# Patient Record
Sex: Female | Born: 1955 | Race: White | Hispanic: No | Marital: Married | Smoking: Former smoker
Health system: Southern US, Community
[De-identification: ages and names within clinical notes are randomized; demographics above are authoritative.]

## PROBLEM LIST (undated history)

## (undated) DIAGNOSIS — E079 Disorder of thyroid, unspecified: Secondary | ICD-10-CM

## (undated) DIAGNOSIS — I7300 Raynaud's syndrome without gangrene: Secondary | ICD-10-CM

## (undated) DIAGNOSIS — H269 Unspecified cataract: Secondary | ICD-10-CM

## (undated) DIAGNOSIS — C801 Malignant (primary) neoplasm, unspecified: Secondary | ICD-10-CM

## (undated) DIAGNOSIS — E785 Hyperlipidemia, unspecified: Secondary | ICD-10-CM

## (undated) DIAGNOSIS — A64 Unspecified sexually transmitted disease: Secondary | ICD-10-CM

## (undated) DIAGNOSIS — R1310 Dysphagia, unspecified: Secondary | ICD-10-CM

## (undated) DIAGNOSIS — C3491 Malignant neoplasm of unspecified part of right bronchus or lung: Secondary | ICD-10-CM

## (undated) DIAGNOSIS — E039 Hypothyroidism, unspecified: Secondary | ICD-10-CM

## (undated) DIAGNOSIS — K635 Polyp of colon: Secondary | ICD-10-CM

## (undated) DIAGNOSIS — E041 Nontoxic single thyroid nodule: Secondary | ICD-10-CM

## (undated) DIAGNOSIS — I10 Essential (primary) hypertension: Secondary | ICD-10-CM

## (undated) DIAGNOSIS — K5900 Constipation, unspecified: Secondary | ICD-10-CM

## (undated) DIAGNOSIS — B1920 Unspecified viral hepatitis C without hepatic coma: Secondary | ICD-10-CM

## (undated) DIAGNOSIS — E0590 Thyrotoxicosis, unspecified without thyrotoxic crisis or storm: Secondary | ICD-10-CM

## (undated) DIAGNOSIS — M549 Dorsalgia, unspecified: Secondary | ICD-10-CM

## (undated) DIAGNOSIS — M2550 Pain in unspecified joint: Secondary | ICD-10-CM

## (undated) DIAGNOSIS — M7989 Other specified soft tissue disorders: Secondary | ICD-10-CM

## (undated) DIAGNOSIS — R7303 Prediabetes: Secondary | ICD-10-CM

## (undated) DIAGNOSIS — E559 Vitamin D deficiency, unspecified: Secondary | ICD-10-CM

## (undated) HISTORY — PX: HX ELBOW SURGERY: 2100001297

## (undated) HISTORY — DX: Disorder of thyroid, unspecified: E07.9

## (undated) HISTORY — PX: HX FOOT SURGERY: 2100001154

## (undated) HISTORY — DX: Raynaud's syndrome without gangrene: I73.00

## (undated) HISTORY — DX: Unspecified sexually transmitted disease: A64

## (undated) HISTORY — PX: TUBAL LIGATION: SHX77

## (undated) HISTORY — DX: Unspecified cataract: H26.9

## (undated) HISTORY — PX: FOOT SURGERY: SHX648

## (undated) HISTORY — PX: LUNG BIOPSY: SHX232

## (undated) HISTORY — DX: Hyperlipidemia, unspecified: E78.5

## (undated) HISTORY — PX: MEDIASTINOSCOPY: SUR861

## (undated) HISTORY — PX: CARPAL TUNNEL RELEASE: SHX101

## (undated) HISTORY — PX: ELBOW SURGERY: SHX618

## (undated) HISTORY — PX: EYE SURGERY: SHX253

## (undated) HISTORY — DX: Malignant (primary) neoplasm, unspecified: C80.1

## (undated) HISTORY — DX: Other specified soft tissue disorders: M79.89

## (undated) HISTORY — DX: Vitamin D deficiency, unspecified: E55.9

## (undated) HISTORY — DX: Essential (primary) hypertension: I10

## (undated) HISTORY — DX: Constipation, unspecified: K59.00

## (undated) HISTORY — DX: Prediabetes: R73.03

## (undated) HISTORY — DX: Pain in unspecified joint: M25.50

## (undated) HISTORY — DX: Dorsalgia, unspecified: M54.9

---

## 1976-06-16 HISTORY — PX: HX TUBAL LIGATION: SHX77

## 1997-06-16 HISTORY — PX: PB FOREARM/WRIST SURGERY UNLISTED: 25999

## 1998-01-12 ENCOUNTER — Other Ambulatory Visit: Admission: RE | Admit: 1998-01-12 | Discharge: 1998-01-12 | Payer: Self-pay | Admitting: Obstetrics and Gynecology

## 1999-02-20 ENCOUNTER — Other Ambulatory Visit: Admission: RE | Admit: 1999-02-20 | Discharge: 1999-02-20 | Payer: Self-pay | Admitting: Obstetrics and Gynecology

## 1999-12-30 ENCOUNTER — Other Ambulatory Visit: Admission: RE | Admit: 1999-12-30 | Discharge: 1999-12-30 | Payer: Self-pay | Admitting: Gynecology

## 2000-11-02 ENCOUNTER — Ambulatory Visit: Payer: Self-pay

## 2001-11-11 ENCOUNTER — Ambulatory Visit: Payer: Self-pay

## 2001-11-16 ENCOUNTER — Ambulatory Visit: Payer: Self-pay

## 2002-11-21 ENCOUNTER — Ambulatory Visit: Payer: Self-pay

## 2003-05-28 ENCOUNTER — Emergency Department: Payer: Self-pay

## 2003-12-25 ENCOUNTER — Ambulatory Visit: Payer: Self-pay

## 2004-04-04 ENCOUNTER — Ambulatory Visit: Payer: Self-pay

## 2005-02-04 ENCOUNTER — Ambulatory Visit: Payer: Self-pay

## 2005-03-25 ENCOUNTER — Ambulatory Visit: Payer: Self-pay | Admitting: Internal Medicine

## 2005-04-08 ENCOUNTER — Ambulatory Visit: Payer: Self-pay

## 2005-04-22 ENCOUNTER — Ambulatory Visit: Payer: Self-pay | Admitting: Internal Medicine

## 2005-06-24 ENCOUNTER — Other Ambulatory Visit: Admission: RE | Admit: 2005-06-24 | Discharge: 2005-06-24 | Payer: Self-pay | Admitting: Obstetrics and Gynecology

## 2005-08-28 ENCOUNTER — Ambulatory Visit: Payer: Self-pay

## 2005-12-16 ENCOUNTER — Ambulatory Visit: Payer: Self-pay | Admitting: Internal Medicine

## 2005-12-18 ENCOUNTER — Ambulatory Visit: Payer: Self-pay | Admitting: Internal Medicine

## 2006-02-18 ENCOUNTER — Ambulatory Visit: Payer: Self-pay

## 2006-03-18 ENCOUNTER — Inpatient Hospital Stay (HOSPITAL_COMMUNITY): Admission: RE | Admit: 2006-03-18 | Discharge: 2006-03-20 | Payer: Self-pay | Admitting: Obstetrics and Gynecology

## 2006-03-18 ENCOUNTER — Encounter (INDEPENDENT_AMBULATORY_CARE_PROVIDER_SITE_OTHER): Payer: Self-pay | Admitting: Specialist

## 2006-03-24 ENCOUNTER — Inpatient Hospital Stay (HOSPITAL_COMMUNITY): Admission: AD | Admit: 2006-03-24 | Discharge: 2006-03-24 | Payer: Self-pay | Admitting: Obstetrics and Gynecology

## 2006-03-24 ENCOUNTER — Ambulatory Visit: Payer: Self-pay

## 2006-04-21 ENCOUNTER — Ambulatory Visit: Payer: Self-pay

## 2006-05-28 ENCOUNTER — Ambulatory Visit: Payer: Self-pay

## 2006-08-15 ENCOUNTER — Ambulatory Visit: Payer: Self-pay

## 2006-09-30 ENCOUNTER — Ambulatory Visit: Payer: Self-pay | Admitting: Internal Medicine

## 2007-02-22 ENCOUNTER — Ambulatory Visit: Payer: Self-pay

## 2007-04-10 ENCOUNTER — Encounter: Payer: Self-pay | Admitting: *Deleted

## 2007-04-10 DIAGNOSIS — E049 Nontoxic goiter, unspecified: Secondary | ICD-10-CM | POA: Insufficient documentation

## 2007-04-10 DIAGNOSIS — Z9889 Other specified postprocedural states: Secondary | ICD-10-CM | POA: Insufficient documentation

## 2007-04-10 DIAGNOSIS — I10 Essential (primary) hypertension: Secondary | ICD-10-CM | POA: Insufficient documentation

## 2007-04-20 ENCOUNTER — Ambulatory Visit: Payer: Self-pay

## 2007-06-15 ENCOUNTER — Ambulatory Visit: Payer: Self-pay

## 2007-06-17 HISTORY — PX: HX COLONOSCOPY: 2100001147

## 2007-08-24 ENCOUNTER — Ambulatory Visit: Payer: Self-pay

## 2007-11-01 ENCOUNTER — Ambulatory Visit: Payer: Self-pay

## 2007-11-10 ENCOUNTER — Other Ambulatory Visit: Payer: Self-pay

## 2007-11-10 ENCOUNTER — Ambulatory Visit: Payer: Self-pay

## 2007-11-12 LAB — HISTORICAL SURGICAL PATHOLOGY SPECIMEN

## 2007-11-22 ENCOUNTER — Ambulatory Visit: Payer: Self-pay

## 2008-02-17 ENCOUNTER — Ambulatory Visit: Payer: Self-pay

## 2008-04-30 ENCOUNTER — Encounter: Admission: RE | Admit: 2008-04-30 | Discharge: 2008-04-30 | Payer: Self-pay | Admitting: Internal Medicine

## 2008-05-16 ENCOUNTER — Encounter (HOSPITAL_COMMUNITY): Admission: RE | Admit: 2008-05-16 | Discharge: 2008-06-15 | Payer: Self-pay | Admitting: Internal Medicine

## 2008-05-19 ENCOUNTER — Ambulatory Visit: Payer: Self-pay

## 2008-05-22 ENCOUNTER — Ambulatory Visit: Payer: Self-pay

## 2008-05-23 ENCOUNTER — Encounter: Admission: RE | Admit: 2008-05-23 | Discharge: 2008-05-23 | Payer: Self-pay | Admitting: Internal Medicine

## 2008-06-19 ENCOUNTER — Ambulatory Visit: Payer: Self-pay

## 2008-06-19 ENCOUNTER — Other Ambulatory Visit: Payer: Self-pay

## 2008-06-22 LAB — HISTORICAL CYTOPATHOLOGY-GYN (PAP AND HPV TESTS): Final Diagnosis: NEGATIVE

## 2008-07-04 ENCOUNTER — Encounter: Admission: RE | Admit: 2008-07-04 | Discharge: 2008-07-04 | Payer: Self-pay | Admitting: Internal Medicine

## 2008-07-17 ENCOUNTER — Ambulatory Visit: Payer: Self-pay

## 2008-07-25 ENCOUNTER — Other Ambulatory Visit: Admission: RE | Admit: 2008-07-25 | Discharge: 2008-07-25 | Payer: Self-pay | Admitting: Interventional Radiology

## 2008-07-25 ENCOUNTER — Encounter (INDEPENDENT_AMBULATORY_CARE_PROVIDER_SITE_OTHER): Payer: Self-pay | Admitting: Interventional Radiology

## 2008-07-25 ENCOUNTER — Encounter: Admission: RE | Admit: 2008-07-25 | Discharge: 2008-07-25 | Payer: Self-pay | Admitting: Internal Medicine

## 2008-08-29 ENCOUNTER — Encounter: Admission: RE | Admit: 2008-08-29 | Discharge: 2008-08-29 | Payer: Self-pay | Admitting: Internal Medicine

## 2008-10-16 ENCOUNTER — Ambulatory Visit: Admission: RE | Admit: 2008-10-16 | Payer: Self-pay | Source: Ambulatory Visit | Admitting: NURSE PRACTITIONER

## 2008-12-13 ENCOUNTER — Ambulatory Visit: Admission: RE | Admit: 2008-12-13 | Payer: Self-pay | Source: Ambulatory Visit | Admitting: NURSE PRACTITIONER

## 2009-04-17 ENCOUNTER — Ambulatory Visit: Admission: RE | Admit: 2009-04-17 | Payer: Self-pay | Source: Ambulatory Visit | Admitting: NURSE PRACTITIONER

## 2009-05-25 ENCOUNTER — Ambulatory Visit: Admission: RE | Admit: 2009-05-25 | Payer: Self-pay | Source: Ambulatory Visit | Admitting: EXTERNAL

## 2009-05-28 ENCOUNTER — Ambulatory Visit: Admission: RE | Admit: 2009-05-28 | Payer: Self-pay | Source: Ambulatory Visit | Admitting: NURSE PRACTITIONER

## 2009-06-14 ENCOUNTER — Ambulatory Visit: Admission: RE | Admit: 2009-06-14 | Payer: Self-pay | Source: Ambulatory Visit | Admitting: NURSE PRACTITIONER

## 2009-06-16 HISTORY — PX: HX CATARACT REMOVAL: SHX102

## 2009-06-21 ENCOUNTER — Other Ambulatory Visit: Payer: Self-pay

## 2009-06-21 ENCOUNTER — Ambulatory Visit: Admission: RE | Admit: 2009-06-21 | Payer: Self-pay | Source: Ambulatory Visit | Admitting: EXTERNAL

## 2009-06-27 LAB — HISTORICAL CYTOPATHOLOGY-GYN (PAP AND HPV TESTS): Final Diagnosis: UNDETERMINED

## 2009-07-05 ENCOUNTER — Ambulatory Visit: Admission: RE | Admit: 2009-07-05 | Payer: Self-pay | Source: Ambulatory Visit | Admitting: ORTHOPAEDIC SURGERY

## 2009-07-17 ENCOUNTER — Ambulatory Visit: Admission: RE | Admit: 2009-07-17 | Payer: Self-pay | Source: Ambulatory Visit | Admitting: ORTHOPAEDIC SURGERY

## 2009-07-20 ENCOUNTER — Ambulatory Visit: Admission: RE | Admit: 2009-07-20 | Payer: Self-pay | Source: Ambulatory Visit | Admitting: ORTHOPAEDIC SURGERY

## 2009-07-23 ENCOUNTER — Ambulatory Visit: Admission: RE | Admit: 2009-07-23 | Payer: Self-pay | Source: Ambulatory Visit | Admitting: ORTHOPAEDIC SURGERY

## 2009-07-25 ENCOUNTER — Ambulatory Visit: Admission: RE | Admit: 2009-07-25 | Payer: Self-pay | Source: Ambulatory Visit | Admitting: ORTHOPAEDIC SURGERY

## 2009-07-30 ENCOUNTER — Ambulatory Visit: Admission: RE | Admit: 2009-07-30 | Payer: Self-pay | Source: Ambulatory Visit | Admitting: ORTHOPAEDIC SURGERY

## 2009-08-01 ENCOUNTER — Ambulatory Visit: Admission: RE | Admit: 2009-08-01 | Payer: Self-pay | Source: Ambulatory Visit | Admitting: ORTHOPAEDIC SURGERY

## 2009-08-08 ENCOUNTER — Ambulatory Visit: Admission: RE | Admit: 2009-08-08 | Payer: Self-pay | Source: Ambulatory Visit | Admitting: ORTHOPAEDIC SURGERY

## 2009-08-10 ENCOUNTER — Ambulatory Visit: Admission: RE | Admit: 2009-08-10 | Payer: Self-pay | Source: Ambulatory Visit | Admitting: NURSE PRACTITIONER

## 2009-08-20 ENCOUNTER — Ambulatory Visit: Admission: RE | Admit: 2009-08-20 | Payer: Self-pay | Source: Ambulatory Visit | Admitting: ORTHOPAEDIC SURGERY

## 2009-09-17 ENCOUNTER — Encounter: Admission: RE | Admit: 2009-09-17 | Discharge: 2009-09-17 | Payer: Self-pay | Admitting: Endocrinology

## 2009-11-24 ENCOUNTER — Ambulatory Visit: Admission: RE | Admit: 2009-11-24 | Payer: Self-pay | Source: Ambulatory Visit | Admitting: NURSE PRACTITIONER

## 2010-01-30 ENCOUNTER — Ambulatory Visit: Admission: RE | Admit: 2010-01-30 | Payer: Self-pay | Source: Ambulatory Visit | Admitting: EXTERNAL

## 2010-07-07 ENCOUNTER — Encounter: Payer: Self-pay | Admitting: Internal Medicine

## 2010-07-12 ENCOUNTER — Ambulatory Visit: Admission: RE | Admit: 2010-07-12 | Payer: Self-pay | Source: Ambulatory Visit | Admitting: Obstetrics & Gynecology

## 2010-08-06 ENCOUNTER — Ambulatory Visit (INDEPENDENT_AMBULATORY_CARE_PROVIDER_SITE_OTHER): Payer: Self-pay | Admitting: Obstetrics & Gynecology

## 2010-08-08 ENCOUNTER — Encounter (INDEPENDENT_AMBULATORY_CARE_PROVIDER_SITE_OTHER): Payer: Self-pay | Admitting: Obstetrics & Gynecology

## 2010-08-15 ENCOUNTER — Encounter (INDEPENDENT_AMBULATORY_CARE_PROVIDER_SITE_OTHER): Payer: Self-pay | Admitting: Obstetrics & Gynecology

## 2010-08-15 ENCOUNTER — Ambulatory Visit: Admission: RE | Admit: 2010-08-15 | Payer: Self-pay | Source: Ambulatory Visit | Admitting: Obstetrics & Gynecology

## 2010-08-15 ENCOUNTER — Ambulatory Visit (INDEPENDENT_AMBULATORY_CARE_PROVIDER_SITE_OTHER): Payer: BC Managed Care – PPO | Admitting: Obstetrics & Gynecology

## 2010-08-15 ENCOUNTER — Other Ambulatory Visit: Payer: Self-pay

## 2010-08-15 NOTE — Progress Notes (Signed)
HPI Comments:    55 y.o. [id number]    Chief Complaint   Patient presents with   . Annual Pap     07-12-10 Mammo - Negative       No gyn concerns today.   Does monthly breast exams    Review of Systems :  Constitutional: Negative.    Skin: Negative.    HENT: Negative.    Cardiovascular: Negative.    Respiratory: Negative.    Gastrointestinal: Negative.    Genitourinary:        See HPI   Musculoskeletal: Negative.    Neurological: Negative.    Psychiatric: Negative.      Patient's PMH/PSH/FH/SH/Meds/Allg were all reviewed and noted in the EPIC chart on today's visit.    Objective:  BP 108/62   Temp(Src) 37.3 C (99.2 F) (Tympanic)   Ht 1.549 m (5\' 1" )   Wt 52.617 kg (116 lb)   BMI 21.92 kg/m2    Physical Exam:    Constitutional: She is oriented. She appears well-developed and well-nourished.     HENT: Head: Normocephalic.     Abdomen: She exhibits no distension. Soft. No tenderness. She has no rebound and no guarding.     Neurological: She is alert and oriented.    Psychiatric: She has a normal mood and affect. Her behavior is normal.     Breast exam:   Right normal breast; with no masses, skin changes or nipple discharge. No tenderness to right breast.   Left normal breast; with no masses, skin changes or nipple discharge.  No tenderness to left breast.   GU:  External Exam: External exam normal.    No urethral tenderness.  No genital lesions present.    Vaginal Exam:  Vagina normal to exam.   Bladder:  Bladder is normal to exam. Suprapubic fullness not present.    Uterus: Uterus is normal to exam.  Cervix is normal to exam.  Position:  A/v a/f  Contour:  Regular.    Mobility:  Mobile.    Adnexa:  No masses and no tenderness.        Assessment & Plan:   1. Screening for malignant neoplasm of the cervix (V76.2)  CYTOPATHOLOGY-GYN (PAP TESTS ONLY)   2. Routine gynecological examination (V72.31)  CYTOPATHOLOGY-GYN (PAP TESTS ONLY)

## 2010-08-20 LAB — HISTORICAL CYTOPATHOLOGY-GYN (PAP AND HPV TESTS)

## 2010-08-21 ENCOUNTER — Other Ambulatory Visit (INDEPENDENT_AMBULATORY_CARE_PROVIDER_SITE_OTHER): Payer: Self-pay | Admitting: Obstetrics & Gynecology

## 2010-08-21 NOTE — Progress Notes (Addendum)
Quick Note:    Left message for patient to call to let her know her pap was abnormal and Dr. Manson Passey would like to do a Colposcopy.  ______

## 2010-10-02 ENCOUNTER — Encounter (INDEPENDENT_AMBULATORY_CARE_PROVIDER_SITE_OTHER): Payer: Self-pay | Admitting: Obstetrics & Gynecology

## 2010-10-02 ENCOUNTER — Ambulatory Visit (INDEPENDENT_AMBULATORY_CARE_PROVIDER_SITE_OTHER): Payer: BC Managed Care – PPO | Admitting: Obstetrics & Gynecology

## 2010-10-02 MED ORDER — CONJ ESTROGEN-MEDROXYPROGESTERONE 0.45 MG-1.5 MG TABLET
1.0000 | ORAL_TABLET | Freq: Every day | ORAL | Status: DC
Start: 2010-10-02 — End: 2011-10-16

## 2010-10-02 NOTE — Progress Notes (Signed)
Subjective:    55 y.o. [id number]     Chief Complaint   Patient presents with   . Abnormal Pap     Colposcopy - 08-15-10 pap = ASCUS cannot exclude HGSIL         Review of Systems :  Constitutional: Negative.    Genitourinary:        See HPI     Objective:  BP 104/62   Temp(Src) 36.7 C (98.1 F) (Tympanic)   Ht 1.549 m (5\' 1" )   Wt 50.803 kg (112 lb)   BMI 21.16 kg/m2  Physical Exam:    Constitutional: She is oriented. She appears well-developed and well-nourished.     HENT: Head: Normocephalic.     Psychiatric: She has a normal mood and affect. Her behavior is normal.     GU:  External Exam: External exam normal.    No urethral tenderness.  No genital lesions present.    Vaginal Exam:  Vagina normal to exam.    Bladder:  Bladder is normal to exam. Suprapubic fullness not present.    Uterus: Uterus is normal to exam.  Cervix is normal to exam.    Assessment & Plan:   1. Menopausal syndrome (hot flashes) (627.2E)  Conj Estrog-Medroxyprogest Ace (PREMPRO) 0.45-1.5 mg Oral Tablet   2. ASCUS favor dysplasia (795.02)  COLPOSCOPY TODAY (AMB)

## 2010-10-02 NOTE — Procedures (Signed)
colpo   TZ fully visible no lesions    NO L.M,P  A: negative colposcopy       ASCUS pap  P: repeat pap in 4 months.

## 2010-11-01 NOTE — Discharge Summary (Signed)
NAMECLANCY, LEINER                ACCOUNT NO.:  [id number]   MEDICAL RECORD NO.:  [id number]          PATIENT TYPE:  INP   LOCATION:  9304                          FACILITY:  [facility]   PHYSICIAN:  Hal Morales, M.D.DATE OF BIRTH:  1956/02/04   DATE OF ADMISSION:  03/18/2006  DATE OF DISCHARGE:  03/20/2006                               DISCHARGE SUMMARY   DISCHARGE DIAGNOSES:  1. Symptomatic uterine fibroids.  2. Menorrhagia.  3. Endometrial polyps.  4. Anemia (hemoglobin as low as 9.1).   OPERATION:  On the date of admission, the patient underwent a total  abdominal hysterectomy with bilateral salpingo-oophorectomy tolerating  procedures well.  The patient was found to have a uterus which was  enlarged to approximately 22 weeks size and weighing 2020 grams.  The  patient's ovaries and tubes appeared normal bilaterally.   HISTORY OF PRESENT ILLNESS:  [name] is a 55 year old single African-  American female para 2-0-0-2 who presents for hysterectomy with  bilateral salpingo-oophorectomy because of symptomatic uterine fibroids  and menorrhagia.  Please see the patient's dictated history and physical  examination for details.   PREOPERATIVE PHYSICAL EXAMINATION:  VITAL SIGNS:  Blood pressure 130/80,  weight is 314 pounds, height is 5 feet 7 inches tall.  GENERAL:  Exam is within normal limits.  ABDOMEN:  Do note on abdominal exam however that the patient does have a  firm mass arising from the pelvis to the level of her umbilicus.  There  was no tenderness or organomegaly.  PELVIC:  EGBUS was within normal limits.  Vagina was rugose.  Cervix was  nontender without lesions.  The patient's uterus was difficult to assess  because of the patient's body habitus; however, it appeared to be 18  weeks size, firm and without tenderness.  Her adnexa revealed no  separable masses nor tenderness.  RECTOVAGINAL:  No masses or tenderness.   HOSPITAL COURSE:  On the date of admission,  the patient underwent  aforementioned procedures tolerating them all well.  Postoperative  course was marked by the patient having a reaction to what was perceived  to have been Percocet which caused severe pruritus.  Once the patient's  oral pain medication was changed to Vicodin, she progressed without any  difficulty and pruritus resolved.  By postop day #2 the patient had  resumed bowel and bladder function and was therefore deemed ready for  discharge home.  The patient's postoperative hemoglobin was 9.8 (preop  hemoglobin 11.7).   DISCHARGE MEDICATIONS:  The patient was advised to follow her home  medications reconciliation sheet.  She was given Vicodin 1-2 tablets  every 4-6 hours as needed for pain.  Ibuprofen 600 mg 1 tablet with food  every 6 hours for 3 days then as needed for pain, Phenergan 25 mg every  6 hours as needed for nausea, Colace 100 mg twice daily until bowel  movements are regular, iron twice daily for 6 weeks.   FOLLOW UP:  The patient has a 6 weeks' postoperative visit with Dr.  Pennie Rushing on April 29, 2006 at 10:30 a.m.  She  was further scheduled  for staple removal at [facility] on March 24, 2006 at 11  a.m.   DISCHARGE INSTRUCTIONS:  The patient was given a copy of [facility] postoperative instruction sheet.  She was further  advised to avoid driving for 2 weeks, heavy lifting for 4 weeks,  intercourse for 6 weeks, that she may walk up steps, may shower and  should increase her activities slowly.  She was given a low-sodium diet.   FINAL PATHOLOGY UTERUS BILATERAL FALLOPIAN TUBES AND OVARIES:  Uterine  cervix with benign endocervical inclusion cyst and chronic  endocervicitis; uterine corpus with benign weakly proliferative  endometrium, benign endometrial polyps, submucosal, intramural and  subserosal leiomyomata; bilateral benign fallopian tubes; benign right  ovary with simple serous cyst and benign glandular inclusions;  benign  left ovary with hemorrhagic corpus luteum.      Julia Ware.      Hal Morales, M.D.  Electronically Signed    EJP/MEDQ  D:  05/27/2006  T:  05/27/2006  Job:  [id number]

## 2010-11-01 NOTE — Op Note (Signed)
Julia Ware, Julia Ware                ACCOUNT NO.:  [id number]   MEDICAL RECORD NO.:  [id number]          PATIENT TYPE:  INP   LOCATION:  9304                          FACILITY:  [facility]   PHYSICIAN:  Hal Morales, M.D.DATE OF BIRTH:  Nov 29, 1955   DATE OF PROCEDURE:  03/18/2006  DATE OF DISCHARGE:                                 OPERATIVE REPORT   PREOPERATIVE DIAGNOSES:  1. Symptomatic uterine fibroids.  2. Menorrhagia.  3. Anemia.  4. Morbid obesity.   POSTOPERATIVE DIAGNOSES:  1. Symptomatic uterine fibroids.  2. Menorrhagia.  3. Anemia.  4. Morbid obesity.   OPERATIONS:  1. Total abdominal hysterectomy, bilateral salpingo-oophorectomy.  2. Placement of Jackson-Pratt subcutaneous drain.   SURGEON:  Hal Morales, M.D.   FIRST ASSISTANT:  Elmira J. Powell, P.A.-C.   ANESTHESIA:  General orotracheal.   ESTIMATED BLOOD LOSS:  500 mL.   COMPLICATIONS:  None.   FINDINGS:  The uterus was enlarged to approximately 22-week size and weighed  2020 g.  The ovaries and tubes appeared normal bilaterally.   PROCEDURE:  The patient was taken to the operating room after appropriate  identification and placed on the operating table.  After the attainment of  adequate general anesthesia, the abdomen, perineum and vagina were prepped  with multiple layers of Betadine.  A Foley catheter was inserted into the  bladder and connected to straight drainage.  An examination under anesthesia  was undertaken.  The abdomen was draped as a sterile field.  Subcutaneous  infiltration along the site of the midline incision for a total of 30 mL of  0.25% Marcaine was undertaken.  A midline incision was then made and curved  around the umbilicus to approach the top of the uterine fundus.  The abdomen  was opened in layers with the Bovie and the fascia opened in the midline.  The peritoneum was entered and the self-retaining Balfour retractor placed.  The right cornual region was then grasped to  allow visualization of the  right round ligament.  It was suture ligated and incised and that incision  taken anteriorly on the anterior leaf of the broad ligament and cephalad  toward the infundibulopelvic ligament.  The infundibulopelvic ligament was  able to be isolated and the ureter visualized so that that IP ligament could  be clamped, cut, tied with two free ties and suture ligated.  The round  ligament on the left side was then identified, suture ligated and the broad  ligament incised.  The anterior leaf of the broad ligament was incised down  toward the bladder to meet the opposite side.  The utero-ovarian ligament  was then clamped, cut and suture ligated.  The bladder was dissected off the  anterior cervix with a combination of blunt and sharp dissection.  The  uterine artery on the left side was skeletonized as the uterus was  manipulated to allow improved visualization.  The uterine artery on the left  side was then doubly clamped, cut and suture ligated.  The uterine artery on  the right side was then skeletonized as the uterus was  manipulated to allow  for visualization.  Once the artery had been clamped, cut and suture  ligated, the uterine fundus was excised from the upper cervix and removed  from the operative field with markedly improved visualization.  The  paracervical tissue was then clamped, cut and suture ligated down to the  level of the uterosacral ligaments.  These were clamped, cut and suture  ligated on the right and left-side and those sutures held.  The vaginal  angles were clamped, cut and suture ligated and the cervix excised from the  upper vagina.  The vaginal cuff closure was completed with a figure-of-eight  suture.  All sutures were 0 Vicryl up to this point.  Hemostasis was  achieved with a figure-of-eight suture of 2-0 Vicryl in the right  uterosacral ligament area.  Copious irrigation was carried out and the  sutures holding the vaginal angles on  the uterosacral ligaments were then  tied together.  The left ovary was elevated and the left ureter identified.  The infundibulopelvic ligament was then clamped, cut and doubly suture  ligated on that left side with removal of the left tube and ovary from the  operative field.  Copious irrigation was again carried out and hemostasis  noted to be adequate.  All instruments were removed from the peritoneal  cavity as were the sponges.  The midline incision was closed in a Smead-  Jones fashion from each apex to the midline, then tied in the midline with  suture of #1 PDS.  The subcutaneous tissue was copiously irrigated and  reapproximated with interrupted sutures of 10-0 plain as a 10 mm Al Pimple drain was placed in the subcutaneous space through a stab wound in the  left lower quadrant.  It was sewn in with a suture of 0 silk.  The skin  incision was then reapproximated with skin staples.  A sterile dressing was  applied and the grenade attached to the Jackson-Pratt drain for suctioned.  Once a sterile dressing had been applied to the patient's incision, she was  awakened from general anesthesia and taken to the recovery room in  satisfactory condition, having tolerated the procedure well with sponge and  instrument counts correct.   SPECIMENS TO PATHOLOGY:  Uterus, cervix, bilateral tubes and ovaries.   TOTAL SURGICAL TIME:  Four hours and 15 minutes.      Hal Morales, M.D.  Electronically Signed     VPH/MEDQ  D:  03/18/2006  T:  03/19/2006  Job:  [id number]

## 2010-11-01 NOTE — H&P (Signed)
NAMEHILDRETH, Ware                ACCOUNT NO.:  [id number]   MEDICAL RECORD NO.:  [id number]          PATIENT TYPE:  AMB   LOCATION:                                [facility]   PHYSICIAN:  Hal Morales, M.D.DATE OF BIRTH:  [birth year]   DATE OF ADMISSION:  DATE OF DISCHARGE:                                HISTORY & PHYSICAL   HISTORY OF PRESENT ILLNESS:  Julia Ware is a 55 year old single African-  American female, para 2-0-0-2 who presents for hysterectomy with bilateral  salpingo-oophorectomy because of symptomatic uterine fibroids and  menorrhagia.  Patient gives a long history of heavy menstrual periods  accompanied by cramping; however, since April of 2007, patient's menstrual  flow has worsened.  Since April, patient's flow would last between 14 to 42  days, requiring a super tampon change every 20 minutes.  She also reports  spotting off and on throughout these bleeding episodes, requiring only a  regular tampon which she changes every two hours.  Additionally, patient  will have severe cramping which she rates as a 9/10 on a 10-point pain  scale; however, finds adequate relief with Midol and nonsteroidal anti-  inflammatory medications which increase her pain to 3/10 on a 10-point pain  scale.  Patient's TSH was within normal limits, and though her hemoglobin  decreased to 9.1, it responded well to iron therapy, increasing to 11.0 by  August of 2007.  A pelvic ultrasound in August of 2007 showed a uterus  measuring 16.14 cm x 12.25 cm x 11.59 cm and revealed multiple fibroids,  though the details of this study were limited by patient's body habitus.  An  endometrial biopsy done at the same time revealed a benign endometrium  without hyperplasia.  Patient was given a trial of Depo-Provera to help with  bleeding; however.  This had no positive impact on patient's flow.  In the  past, patient has used oral contraceptive, but due to hypertension, she  chose not to  continue.  Though patient is aware of the medical and surgical  options for management of her symptoms, to include hormonal therapy, uterine  artery embolization, myomectomy, Lupron and hysterectomy, she has decided to  proceed with definitive therapy in the form of hysterectomy.   PAST MEDICAL HISTORY:  OB History:  Gravida 2, para 2-0-0-2.  Patient  delivered via spontaneous vaginal delivery.   GYNECOLOGICAL HISTORY:  Menarche 55 years old.  Last menstrual period January 28, 2006.  Patient denies any history of sexually transmitted diseases or  abnormal Pap smears.  She does not use any method of contraception.  Her  last Pap smear was January 2007 and it was within normal limits.   MEDICAL HISTORY:  Hypertension.   SURGICAL HISTORY:  2000, right knee surgery for torn cartilage.  Denies any  problems with anesthesia or history of blood transfusions.   FAMILY HISTORY:  Epilepsy, hypertension, DVT, non-insulin dependent diabetes  mellitus, endstage renal disease and migraines.   SOCIAL HISTORY:  Patient is single, and she works as an Advertising account planner.   HABITS:  Patient  does not use tobacco, and she consumes alcohol socially.   CURRENT MEDICATIONS:  1. Micardis 80 mg daily.  2. Iron 1 tablet daily.  3. Calcium 500 mg twice daily.   REVIEW OF SYSTEMS:  Patient denies vaginitis symptoms, urinary tract  symptoms, nausea, vomiting, diarrhea or dyspareunia, and except as mentioned  in history of present illness, patient's remainder of review of systems is  also negative.   PHYSICAL EXAMINATION:  VITAL SIGNS:  Blood pressure 130/80. Weight is 314  pounds.  Height is 5 feet 7 inches tall.  NECK:  Supple.  There are no masses, adenopathy or thyromegaly.  HEART:  Regular rate and rhythm.  There is no murmur.  LUNGS:  Clear to auscultation.  There are no wheezes, rales or rhonchi.  BACK:  No CVA tenderness.  ABDOMEN:  Bowel sounds are present.  It is soft.  Patient does have a firm   mass arising from the pelvis to the level of her umbilicus.  There is no  tenderness or organomegaly.  EXTREMITIES:  No clubbing, cyanosis or edema.  PELVIS:  EGBUS is within normal limits.  Vagina is rugose.  Cervix is  nontender without any lesions.  UTERUS:  Difficult to assess, secondary to patient's body habitus.  However,  it appears 18-week size and firm without tenderness.  Adnexa:  No separable  masses, no tenderness.  Rectovaginal:  No masses or tenderness.   IMPRESSION:  1. Symptomatic uterine fibroids.  2. Menorrhagia (even after Depo-Provera).  3. Anemia (hemoglobin as low as 9.1).   DISPOSITION:  A discussion was held with patient regarding the indications  for her procedures, along with the risks which include but are not limited  to reaction to anesthesia, damage to adjacent organs, infection and  excessive bleeding.  Patient was given a copy of the ACOG brochure entitled  understanding hysterectomy and preparing for surgery.  Patient has consented  to proceed with a total abdominal  hysterectomy with bilateral salpingo-  oophorectomy at [facility] on March 18, 2006 at 7:30  a.m.      Julia Ware.      Hal Morales, M.D.  Electronically Signed    EJP/MEDQ  D:  03/12/2006  T:  03/12/2006  Job:  [id number]

## 2010-11-21 ENCOUNTER — Ambulatory Visit
Admission: RE | Admit: 2010-11-21 | Discharge: 2010-11-21 | Payer: Self-pay | Source: Ambulatory Visit | Attending: NURSE PRACTITIONER | Admitting: NURSE PRACTITIONER

## 2011-02-04 ENCOUNTER — Ambulatory Visit (INDEPENDENT_AMBULATORY_CARE_PROVIDER_SITE_OTHER): Payer: BC Managed Care – PPO | Admitting: Obstetrics & Gynecology

## 2011-02-13 ENCOUNTER — Other Ambulatory Visit (HOSPITAL_BASED_OUTPATIENT_CLINIC_OR_DEPARTMENT_OTHER)
Admission: RE | Admit: 2011-02-13 | Discharge: 2011-02-13 | Disposition: A | Discharge status: Home / Self Care | Payer: BLUE CROSS/BLUE SHIELD | Source: Ambulatory Visit | Attending: Obstetrics & Gynecology | Admitting: Obstetrics & Gynecology

## 2011-02-13 ENCOUNTER — Ambulatory Visit (INDEPENDENT_AMBULATORY_CARE_PROVIDER_SITE_OTHER): Payer: BC Managed Care – PPO | Admitting: Obstetrics & Gynecology

## 2011-02-13 VITALS — BP 104/64 | Temp 97.9°F | Ht 61.0 in | Wt 116.0 lb

## 2011-02-13 NOTE — Progress Notes (Signed)
Subjective:    55 y.o. [id number]     Chief Complaint   Patient presents with   . Repeat Pap/ECC     08-15-10 pap = ASCUS cannot rule out HGSIL   For repeat pap. Getting ready to go camping    Review of Systems :  Constitutional: Negative.    Genitourinary:        See HPI     Objective:  BP 104/64   Temp(Src) 36.6 C (97.9 F) (Tympanic)   Ht 1.549 m (5\' 1" )   Wt 52.617 kg (116 lb)   BMI 21.92 kg/m2  Physical Exam:    Constitutional: She is oriented. She appears well-developed and well-nourished.     HENT: Head: Normocephalic.     Psychiatric: She has a normal mood and affect. Her behavior is normal.     GU:  External Exam: External exam normal.    No urethral tenderness.  No genital lesions present.    Vaginal Exam:  Vagina normal to exam.    Bladder:  Bladder is normal to exam. Suprapubic fullness not present.    Uterus: Uterus is normal to exam.  Cervix is normal to exam.    Assessment & Plan:   1. Papanicolaou smear of cervix with atypical squamous cells cannot exclude high grade squamous intraepithelial lesion (ASC-H) (795.02)  CYTOPATHOLOGY-GYN (PAP TESTS ONLY)       Return in about 6 months (around 08/14/2011).

## 2011-02-18 LAB — HISTORICAL CYTOPATHOLOGY-GYN (PAP AND HPV TESTS): Final Diagnosis: NEGATIVE

## 2011-02-19 ENCOUNTER — Other Ambulatory Visit (HOSPITAL_BASED_OUTPATIENT_CLINIC_OR_DEPARTMENT_OTHER): Payer: Self-pay | Admitting: Obstetrics & Gynecology

## 2011-03-25 ENCOUNTER — Other Ambulatory Visit: Payer: Self-pay | Admitting: Internal Medicine

## 2011-03-25 DIAGNOSIS — E041 Nontoxic single thyroid nodule: Secondary | ICD-10-CM

## 2011-04-09 ENCOUNTER — Ambulatory Visit
Admission: RE | Admit: 2011-04-09 | Discharge: 2011-04-09 | Disposition: A | Discharge status: Home / Self Care | Payer: 59 | Source: Ambulatory Visit | Attending: Internal Medicine | Admitting: Internal Medicine

## 2011-04-09 DIAGNOSIS — E041 Nontoxic single thyroid nodule: Secondary | ICD-10-CM

## 2011-04-17 ENCOUNTER — Other Ambulatory Visit: Payer: Self-pay | Admitting: Internal Medicine

## 2011-04-17 DIAGNOSIS — E041 Nontoxic single thyroid nodule: Secondary | ICD-10-CM

## 2011-04-23 ENCOUNTER — Ambulatory Visit
Admission: RE | Admit: 2011-04-23 | Discharge: 2011-04-23 | Disposition: A | Discharge status: Home / Self Care | Payer: 59 | Source: Ambulatory Visit | Attending: Internal Medicine | Admitting: Internal Medicine

## 2011-04-23 ENCOUNTER — Other Ambulatory Visit (HOSPITAL_COMMUNITY)
Admission: RE | Admit: 2011-04-23 | Discharge: 2011-04-23 | Disposition: A | Discharge status: Home / Self Care | Payer: 59 | Source: Ambulatory Visit | Attending: Diagnostic Radiology | Admitting: Diagnostic Radiology

## 2011-04-23 DIAGNOSIS — E041 Nontoxic single thyroid nodule: Secondary | ICD-10-CM

## 2011-04-23 DIAGNOSIS — E049 Nontoxic goiter, unspecified: Secondary | ICD-10-CM | POA: Insufficient documentation

## 2011-06-05 ENCOUNTER — Ambulatory Visit
Admission: RE | Admit: 2011-06-05 | Discharge: 2011-06-05 | Disposition: A | Discharge status: Home / Self Care | Payer: BLUE CROSS/BLUE SHIELD | Source: Ambulatory Visit | Attending: NURSE PRACTITIONER | Admitting: NURSE PRACTITIONER

## 2011-06-05 DIAGNOSIS — E785 Hyperlipidemia, unspecified: Secondary | ICD-10-CM | POA: Insufficient documentation

## 2011-06-05 LAB — LIPID PANEL
CHOL/HDL RATIO: 3.5
CHOLESTEROL: 198 mg/dL (ref 120–199)
HDL-CHOLESTEROL: 56 mg/dL (ref >39)
LDL (CALCULATED): 130 mg/dL — ABNORMAL HIGH (ref <130)
TRIGLYCERIDES: 61 mg/dL — AB (ref <150)
VLDL (CALCULATED): 12 mg/dL (ref 5–35)

## 2011-06-05 LAB — COMPREHENSIVE METABOLIC PROFILE - BMC/JMC ONLY
ALBUMIN: 3.8 g/dL (ref 3.2–5.0)
ALKALINE PHOSPHATASE: 50 IU/L (ref 35–120)
ALT (SGPT): 14 IU/L (ref 0–55)
AST (SGOT): 19 IU/L (ref 0–45)
BILIRUBIN, TOTAL: 0.3 mg/dL (ref 0.0–1.3)
BUN: 9 mg/dL (ref 6–22)
CALCIUM: 9.3 mg/dL (ref 8.5–10.5)
CARBON DIOXIDE: 25 mmol/L (ref 22–32)
CHLORIDE: 106 mmol/L (ref 101–111)
CREATININE: 0.62 mg/dL (ref 0.53–1.00)
ESTIMATED GLOMERULAR FILTRATION RATE: >60 mL/min (ref >60)
GLUCOSE: 95 mg/dL (ref 70–110)
POTASSIUM: 4.2 mmol/L (ref 3.5–5.0)
SODIUM: 140 mmol/L (ref 136–145)
TOTAL PROTEIN: 6.8 g/dL (ref 6.0–8.0)

## 2011-08-26 ENCOUNTER — Telehealth (INDEPENDENT_AMBULATORY_CARE_PROVIDER_SITE_OTHER): Payer: Self-pay | Admitting: Obstetrics & Gynecology

## 2011-08-26 NOTE — Telephone Encounter (Signed)
NEEDS ANNUAL SCHEDULED

## 2011-08-26 NOTE — Telephone Encounter (Signed)
Message copied by Lenis Dickinson on Tue Aug 26, 2011  2:17 PM  ------       Message from: Melinda Crutch       Created: Tue Aug 26, 2011 10:54 AM       Regarding: RE: CHART REVIEW         Annual pap       ----- Message -----          From: Sharion Balloon          Sent: 08/25/2011  11:27 AM            To: Donnetta Simpers, MD       Subject: CHART REVIEW                                               ASCUS, CANNOT EXCLUDE HGSIL              10-02-10 COLPO = NEGATIVE              02-13-11 PAP = WNL              NO REVIEW OR FOLLOW-UP SCHEDULED. WHAT WOULD YOU LIKE TO DO?

## 2011-08-29 ENCOUNTER — Ambulatory Visit (INDEPENDENT_AMBULATORY_CARE_PROVIDER_SITE_OTHER): Payer: Self-pay | Admitting: Obstetrics & Gynecology

## 2011-08-29 NOTE — Telephone Encounter (Signed)
SPOKE WITH PATIENT ABOUT PAP RESULTS AND DR. Theora Gianotti RECOMMENDATIONS - REPEAT PAP SCHEDULED.

## 2011-09-08 ENCOUNTER — Ambulatory Visit
Admission: RE | Admit: 2011-09-08 | Disposition: A | Discharge status: Home / Self Care | Payer: Self-pay | Source: Ambulatory Visit

## 2011-09-25 ENCOUNTER — Ambulatory Visit (HOSPITAL_BASED_OUTPATIENT_CLINIC_OR_DEPARTMENT_OTHER)
Admission: RE | Admit: 2011-09-25 | Discharge: 2011-09-25 | Disposition: A | Discharge status: Home / Self Care | Payer: BLUE CROSS/BLUE SHIELD | Source: Ambulatory Visit | Attending: Obstetrics & Gynecology | Admitting: Obstetrics & Gynecology

## 2011-09-25 DIAGNOSIS — Z1231 Encounter for screening mammogram for malignant neoplasm of breast: Secondary | ICD-10-CM | POA: Insufficient documentation

## 2011-10-01 ENCOUNTER — Encounter (INDEPENDENT_AMBULATORY_CARE_PROVIDER_SITE_OTHER): Payer: Self-pay | Admitting: Obstetrics & Gynecology

## 2011-10-01 ENCOUNTER — Other Ambulatory Visit
Admission: RE | Admit: 2011-10-01 | Discharge: 2011-10-01 | Disposition: A | Discharge status: Home / Self Care | Payer: BLUE CROSS/BLUE SHIELD

## 2011-10-01 ENCOUNTER — Ambulatory Visit (INDEPENDENT_AMBULATORY_CARE_PROVIDER_SITE_OTHER): Payer: BLUE CROSS/BLUE SHIELD | Admitting: Obstetrics & Gynecology

## 2011-10-01 ENCOUNTER — Other Ambulatory Visit: Payer: Self-pay

## 2011-10-01 VITALS — BP 101/67 | Temp 98.5°F | Ht 61.0 in | Wt 115.0 lb

## 2011-10-01 DIAGNOSIS — Z124 Encounter for screening for malignant neoplasm of cervix: Secondary | ICD-10-CM | POA: Insufficient documentation

## 2011-10-01 NOTE — Progress Notes (Signed)
[facility]  [address]  [place], [place]  [address]  [phone]      HPI Comments:    56 y.o. [phone]    Chief Complaint   Patient presents with   . Annual Pap   For annual exam. Does monthly breast exam.      Review of Systems :  Constitutional: Negative.    Skin: Negative.    HENT: Negative.    Cardiovascular: Negative.    Respiratory: Negative.    Gastrointestinal: Negative.    Genitourinary:        See HPI   Musculoskeletal: Negative.    Neurological: Negative.    Psychiatric: Negative.      Patient's PMH/PSH/FH/SH/Meds/Allg were all reviewed and noted in the EPIC chart on today's visit.    Objective:  BP 101/67   Temp(Src) 36.9 C (98.5 F) (Tympanic)   Ht 1.549 m (5\' 1" )   Wt 52.164 kg (115 lb)   BMI 21.73 kg/m2    Physical Exam:    Constitutional: She is oriented. She appears well-developed and well-nourished.     HENT: Head: Normocephalic.     Abdomen: She exhibits no distension. Soft. No tenderness. She has no rebound and no guarding.     Neurological: She is alert and oriented.    Psychiatric: She has a normal mood and affect. Her behavior is normal.     Breast exam:   Right normal breast; with no masses, skin changes or nipple discharge. No tenderness to right breast.   Left normal breast; with no masses, skin changes or nipple discharge.  No tenderness to left breast.   GU:  External Exam: External exam normal.    No urethral tenderness.  No genital lesions present.    Vaginal Exam:  Vagina normal to exam.   Bladder:  Bladder is normal to exam. Suprapubic fullness not present.    Uterus: Uterus is normal to exam.  Cervix is normal to exam.  Position: a/v a/f  Contour:  Regular.    Mobility:  Mobile.    Adnexa:  No masses and no tenderness.        Assessment & Plan:   1. Screening for malignant neoplasm of the cervix (V76.2)  CYTOPATHOLOGY-GYN (PAP TESTS ONLY)   2. Routine gynecological examination (V72.31)  CYTOPATHOLOGY-GYN (PAP TESTS ONLY)     Continue regime   Return in about 1 year (around 09/30/2012).

## 2011-10-06 ENCOUNTER — Other Ambulatory Visit (HOSPITAL_BASED_OUTPATIENT_CLINIC_OR_DEPARTMENT_OTHER): Payer: Self-pay | Admitting: Obstetrics & Gynecology

## 2011-10-06 LAB — HISTORICAL CYTOPATHOLOGY-GYN (PAP AND HPV TESTS): Final Diagnosis: NEGATIVE

## 2011-10-14 ENCOUNTER — Emergency Department (HOSPITAL_COMMUNITY): Payer: No Typology Code available for payment source

## 2011-10-14 ENCOUNTER — Emergency Department (HOSPITAL_COMMUNITY)
Admission: EM | Admit: 2011-10-14 | Discharge: 2011-10-14 | Disposition: A | Discharge status: Home / Self Care | Payer: No Typology Code available for payment source | Attending: Emergency Medicine | Admitting: Emergency Medicine

## 2011-10-14 DIAGNOSIS — M542 Cervicalgia: Secondary | ICD-10-CM | POA: Insufficient documentation

## 2011-10-14 DIAGNOSIS — R51 Headache: Secondary | ICD-10-CM | POA: Insufficient documentation

## 2011-10-14 DIAGNOSIS — S0003XA Contusion of scalp, initial encounter: Secondary | ICD-10-CM | POA: Insufficient documentation

## 2011-10-14 DIAGNOSIS — S1093XA Contusion of unspecified part of neck, initial encounter: Secondary | ICD-10-CM | POA: Insufficient documentation

## 2011-10-14 DIAGNOSIS — S0990XA Unspecified injury of head, initial encounter: Secondary | ICD-10-CM | POA: Insufficient documentation

## 2011-10-14 DIAGNOSIS — M549 Dorsalgia, unspecified: Secondary | ICD-10-CM | POA: Insufficient documentation

## 2011-10-14 MED ORDER — HYDROCODONE-ACETAMINOPHEN 5-325 MG PO TABS
2.0000 | ORAL_TABLET | Freq: Once | ORAL | Status: AC
  Administered 2011-10-14: 2 via ORAL
  Filled 2011-10-14: qty 2

## 2011-10-14 MED ORDER — IBUPROFEN 800 MG PO TABS: 800.0000 mg | ORAL_TABLET | Freq: Three times a day (TID) | ORAL | Status: AC

## 2011-10-14 NOTE — ED Notes (Signed)
NAD noted at time of d/c home 

## 2011-10-14 NOTE — ED Notes (Signed)
Pt ambulated to bathroom without difficulty. Steady gait. No c/o dizziness or fatigue. Sprite given for fluid challenge.

## 2011-10-14 NOTE — Discharge Instructions (Signed)
Concussion and Brain Injury A blow or jolt to the head can disrupt the normal function of the brain. This type of brain injury is often called a "concussion" or a "closed head injury." Concussions are usually not life-threatening. Even so, the effects of a concussion can be serious.  CAUSES  A concussion is caused by a blunt blow to the head. The blow might be direct or indirect as described below.  Direct blow (running into another player during a soccer game, being hit in a fight, or hitting your head on a hard surface).   Indirect blow (when your head moves rapidly and violently back and forth like in a car crash).  SYMPTOMS  The brain is very complex. Every head injury is different. Some symptoms may appear right away. Other symptoms may not show up for days or weeks after the concussion. The signs of concussion can be hard to notice. Early on, problems may be missed by patients, family members, and caregivers. You may look fine even though you are acting or feeling differently.  These symptoms are usually temporary, but may last for days, weeks, or even longer. Symptoms include:  Mild headaches that will not go away.   Having more trouble than usual with:   Remembering things.   Paying attention or concentrating.   Organizing daily tasks.   Making decisions and solving problems.   Slowness in thinking, acting, speaking, or reading.   Getting lost or easily confused.   Feeling tired all the time or lacking energy (fatigue).   Feeling drowsy.   Sleep disturbances.   Sleeping more than usual.   Sleeping less than usual.   Trouble falling asleep.   Trouble sleeping (insomnia).   Loss of balance or feeling lightheaded or dizzy.   Nausea or vomiting.   Numbness or tingling.   Increased sensitivity to:   Sounds.   Lights.   Distractions.  Other symptoms might include:  Vision problems or eyes that tire easily.   Diminished sense of taste or smell.   Ringing  in the ears.   Mood changes such as feeling sad, anxious, or listless.   Becoming easily irritated or angry for little or no reason.   Lack of motivation.  DIAGNOSIS  Your caregiver can usually diagnose a concussion or mild brain injury based on your description of your injury and your symptoms.  Your evaluation might include:  A brain scan to look for signs of injury to the brain. Even if the test shows no injury, you may still have a concussion.   Blood tests to be sure other problems are not present.  TREATMENT   People with a concussion need to be examined and evaluated. Most people with concussions are treated in an emergency department, urgent care, or clinic. Some people must stay in the hospital overnight for further treatment.   Your caregiver will send you home with important instructions to follow. Be sure to carefully follow them.   Tell your caregiver if you are already taking any medicines (prescription, over-the-counter, or natural remedies), or if you are drinking alcohol or taking illegal drugs. Also, talk with your caregiver if you are taking blood thinners (anticoagulants) or aspirin. These drugs may increase your chances of complications. All of this is important information that may affect treatment.   Only take over-the-counter or prescription medicines for pain, discomfort, or fever as directed by your caregiver.  PROGNOSIS  How fast people recover from brain injury varies from person to person.   Although most people have a good recovery, how quickly they improve depends on many factors. These factors include how severe their concussion was, what part of the brain was injured, their age, and how healthy they were before the concussion.  Because all head injuries are different, so is recovery. Most people with mild injuries recover fully. Recovery can take time. In general, recovery is slower in older persons. Also, persons who have had a concussion in the past or have  other medical problems may find that it takes longer to recover from their current injury. Anxiety and depression may also make it harder to adjust to the symptoms of brain injury. HOME CARE INSTRUCTIONS  Return to your normal activities slowly, not all at once. You must give your body and brain enough time for recovery.  Get plenty of sleep at night, and rest during the day. Rest helps the brain to heal.   Avoid staying up late at night.   Keep the same bedtime hours on weekends and weekdays.   Take daytime naps or rest breaks when you feel tired.   Limit activities that require a lot of thought or concentration (brain or cognitive rest). This includes:   Homework or job-related work.   Watching TV.   Computer work.   Avoid activities that could lead to a second brain injury, such as contact or recreational sports, until your caregiver says it is okay. Even after your brain injury has healed, you should protect yourself from having another concussion.   Ask your caregiver when you can return to your normal activities such as driving, bicycling, or operating heavy equipment. Your ability to react may be slower after a brain injury.   Talk with your caregiver about when you can return to work or school.   Inform your teachers, school nurse, school counselor, coach, athletic trainer, or work manager about your injury, symptoms, and restrictions. They should be instructed to report:   Increased problems with attention or concentration.   Increased problems remembering or learning new information.   Increased time needed to complete tasks or assignments.   Increased irritability or decreased ability to cope with stress.   Increased symptoms.   Take only those medicines that your caregiver has approved.   Do not drink alcohol until your caregiver says you are well enough to do so. Alcohol and certain other drugs may slow your recovery and can put you at risk of further injury.    If it is harder than usual to remember things, write them down.   If you are easily distracted, try to do one thing at a time. For example, do not try to watch TV while fixing dinner.   Talk with family members or close friends when making important decisions.   Keep all follow-up appointments. Repeated evaluation of your symptoms is recommended for your recovery.  PREVENTION  Protect your head from future injury. It is very important to avoid another head or brain injury before you have recovered. In rare cases, another injury has lead to permanent brain damage, brain swelling, or death. Avoid injuries by using:  Seatbelts when riding in a car.   Alcohol only in moderation.   A helmet when biking, skiing, skateboarding, skating, or doing similar activities.   Safety measures in your home.   Remove clutter and tripping hazards from floors and stairways.   Use grab bars in bathrooms and handrails by stairs.   Place non-slip mats on floors and in bathtubs.     Improve lighting in dim areas.  SEEK MEDICAL CARE IF:  A head injury can cause lingering symptoms. You should seek medical care if you have any of the following symptoms for more than 3 weeks after your injury or are planning to return to sports:  Chronic headaches.   Dizziness or balance problems.   Nausea.   Vision problems.   Increased sensitivity to noise or light.   Depression or mood swings.   Anxiety or irritability.   Memory problems.   Difficulty concentrating or paying attention.   Sleep problems.   Feeling tired all the time.  SEEK IMMEDIATE MEDICAL CARE IF:  You have had a blow or jolt to the head and you (or your family or friends) notice:  Severe or worsening headaches.   Weakness (even if only in one hand or one leg or one part of the face), numbness, or decreased coordination.   Repeated vomiting.   Increased sleepiness or passing out.   One black center of the eye (pupil) is larger  than the other.   Convulsions (seizures).   Slurred speech.   Increasing confusion, restlessness, agitation, or irritability.   Lack of ability to recognize people or places.   Neck pain.   Difficulty being awakened.   Unusual behavior changes.   Loss of consciousness.  Older adults with a brain injury may have a higher risk of serious complications such as a blood clot on the brain. Headaches that get worse or an increase in confusion are signs of this complication. If these signs occur, see a caregiver right away. MAKE SURE YOU:   Understand these instructions.   Will watch your condition.   Will get help right away if you are not doing well or get worse.  FOR MORE INFORMATION  Several groups help people with brain injury and their families. They provide information and put people in touch with local resources. These include support groups, rehabilitation services, and a variety of health care professionals. Among these groups, the Brain Injury Association (BIA, www.biausa.org) has a national office that gathers scientific and educational information and works on a national level to help people with brain injury.  Document Released: 08/23/2003 Document Revised: 05/22/2011 Document Reviewed: 01/19/2008 ExitCare Patient Information 2012 [employer].Motor Vehicle Collision  It is common to have multiple bruises and sore muscles after a motor vehicle collision (MVC). These tend to feel worse for the first 24 hours. You may have the most stiffness and soreness over the first several hours. You may also feel worse when you wake up the first morning after your collision. After this point, you will usually begin to improve with each day. The speed of improvement often depends on the severity of the collision, the number of injuries, and the location and nature of these injuries. HOME CARE INSTRUCTIONS   Put ice on the injured area.   Put ice in a plastic bag.   Place a towel between  your skin and the bag.   Leave the ice on for 15 to 20 minutes, 3 to 4 times a day.   Drink enough fluids to keep your urine clear or pale yellow. Do not drink alcohol.   Take a warm shower or bath once or twice a day. This will increase blood flow to sore muscles.   You may return to activities as directed by your caregiver. Be careful when lifting, as this may aggravate neck or back pain.   Only take over-the-counter or prescription medicines for   pain, discomfort, or fever as directed by your caregiver. Do not use aspirin. This may increase bruising and bleeding.  SEEK IMMEDIATE MEDICAL CARE IF:  You have numbness, tingling, or weakness in the arms or legs.   You develop severe headaches not relieved with medicine.   You have severe neck pain, especially tenderness in the middle of the back of your neck.   You have changes in bowel or bladder control.   There is increasing pain in any area of the body.   You have shortness of breath, lightheadedness, dizziness, or fainting.   You have chest pain.   You feel sick to your stomach (nauseous), throw up (vomit), or sweat.   You have increasing abdominal discomfort.   There is blood in your urine, stool, or vomit.   You have pain in your shoulder (shoulder strap areas).   You feel your symptoms are getting worse.  MAKE SURE YOU:   Understand these instructions.   Will watch your condition.   Will get help right away if you are not doing well or get worse.  Document Released: 06/02/2005 Document Revised: 05/22/2011 Document Reviewed: 10/30/2010 ExitCare Patient Information 2012 [employer]. 
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
## 2011-10-16 ENCOUNTER — Ambulatory Visit (INDEPENDENT_AMBULATORY_CARE_PROVIDER_SITE_OTHER): Payer: Self-pay | Admitting: Obstetrics & Gynecology

## 2011-10-16 NOTE — Telephone Encounter (Signed)
Pt called and needs a refill of her Prempro e scribed to [facility]).  Please let her know when this has been taken care of.  -LB

## 2011-10-20 MED ORDER — CONJ ESTROGEN-MEDROXYPROGESTERONE 0.45 MG-1.5 MG TABLET
1.0000 | ORAL_TABLET | Freq: Every day | ORAL | Status: DC
Start: 2011-10-16 — End: 2012-12-01

## 2011-10-24 ENCOUNTER — Encounter: Payer: Self-pay | Admitting: EXTERNAL

## 2011-11-25 ENCOUNTER — Ambulatory Visit (HOSPITAL_BASED_OUTPATIENT_CLINIC_OR_DEPARTMENT_OTHER)
Admission: RE | Admit: 2011-11-25 | Discharge: 2011-11-25 | Disposition: A | Discharge status: Home / Self Care | Payer: Self-pay | Source: Ambulatory Visit

## 2011-11-25 DIAGNOSIS — Z0289 Encounter for other administrative examinations: Secondary | ICD-10-CM | POA: Insufficient documentation

## 2011-11-25 LAB — LIPID PANEL
CHOL/HDL RATIO: 4.1
CHOLESTEROL: 223 mg/dL — ABNORMAL HIGH (ref 120–199)
HDL-CHOLESTEROL: 55 mg/dL (ref >39)
LDL (CALCULATED): 156 mg/dL — ABNORMAL HIGH (ref <130)
TRIGLYCERIDES: 58 mg/dL (ref <150)
VLDL (CALCULATED): 12 mg/dL (ref 5–35)

## 2011-11-25 LAB — GLUCOSE - BMC/JMC ONLY: GLUCOSE: 93 mg/dL (ref 70–110)

## 2011-11-28 ENCOUNTER — Ambulatory Visit
Admission: RE | Admit: 2011-11-28 | Discharge: 2011-11-28 | Disposition: A | Discharge status: Home / Self Care | Payer: BLUE CROSS/BLUE SHIELD | Source: Ambulatory Visit | Attending: NURSE PRACTITIONER | Admitting: NURSE PRACTITIONER

## 2011-11-28 DIAGNOSIS — E039 Hypothyroidism, unspecified: Secondary | ICD-10-CM | POA: Insufficient documentation

## 2011-11-28 DIAGNOSIS — E785 Hyperlipidemia, unspecified: Secondary | ICD-10-CM | POA: Insufficient documentation

## 2011-11-28 LAB — LIPID PANEL
CHOL/HDL RATIO: 3.4
CHOLESTEROL: 206 mg/dL — ABNORMAL HIGH (ref 120–199)
HDL-CHOLESTEROL: 61 mg/dL (ref >39)
LDL (CALCULATED): 134 mg/dL — ABNORMAL HIGH (ref <130)
TRIGLYCERIDES: 55 mg/dL (ref <150)
VLDL (CALCULATED): 11 mg/dL (ref 5–35)

## 2011-11-28 LAB — COMPREHENSIVE METABOLIC PROFILE - BMC/JMC ONLY
ALBUMIN: 3.5 g/dL (ref 3.2–5.0)
ALKALINE PHOSPHATASE: 53 IU/L (ref 35–120)
ALT (SGPT): 12 IU/L (ref 0–55)
AST (SGOT): 18 IU/L (ref 0–45)
BILIRUBIN, TOTAL: 0.4 mg/dL (ref 0.0–1.3)
BUN: 11 mg/dL (ref 6–22)
CALCIUM: 9.2 mg/dL (ref 8.5–10.5)
CARBON DIOXIDE: 26 mmol/L (ref 22–32)
CHLORIDE: 107 mmol/L (ref 101–111)
CREATININE: 0.72 mg/dL (ref 0.53–1.00)
ESTIMATED GLOMERULAR FILTRATION RATE: >60 mL/min (ref >60)
GLUCOSE: 97 mg/dL (ref 70–110)
POTASSIUM: 4.2 mmol/L (ref 3.5–5.0)
SODIUM: 137 mmol/L (ref 136–145)
TOTAL PROTEIN: 6.8 g/dL (ref 6.0–8.0)

## 2011-11-28 LAB — CBC
BASOPHIL #: 0.01 10*3/uL (ref 0.00–0.10)
BASOPHILS %: 0.3 % (ref 0.0–1.4)
EOSINOPHIL #: 0.17 10*3/uL (ref 0.00–0.50)
EOSINOPHIL %: 4.9 % (ref 0.0–5.2)
HCT: 37.5 % (ref 36.0–45.0)
HGB: 12.7 g/dL (ref 12.0–15.5)
LYMPHOCYTE #: 1.05 10*3/uL (ref 0.70–3.20)
LYMPHOCYTE %: 29.6 % (ref 15.0–43.0)
MCH: 30 pg (ref 28.0–34.0)
MCHC: 34 g/dL (ref 33.0–37.0)
MCV: 88 fL (ref 82–97)
MONOCYTE #: 0.41 10*3/uL (ref 0.20–0.90)
MONOCYTE %: 11.6 % (ref 4.8–12.0)
MPV: 8.3 fL (ref 7.0–9.4)
PLATELET COUNT: 228 K/uL (ref 150–400)
PMN #: 1.91 10*3/uL (ref 1.50–6.50)
PMN %: 53.6 % (ref 43.0–76.0)
RBC: 4.24 M/uL (ref 4.00–5.10)
RDW: 13.6 % (ref 10.5–14.5)
WBC: 3.6 10*3/uL — ABNORMAL LOW (ref 4.0–11.0)

## 2011-11-28 LAB — TSH,3RD GENERATION: TSH 3RD GENERATION: 3.381 u[IU]/mL (ref 0.340–5.600)

## 2012-02-05 DIAGNOSIS — H04129 Dry eye syndrome of unspecified lacrimal gland: Secondary | ICD-10-CM | POA: Insufficient documentation

## 2012-02-05 DIAGNOSIS — Z961 Presence of intraocular lens: Secondary | ICD-10-CM | POA: Insufficient documentation

## 2012-02-06 DIAGNOSIS — H16123 Filamentary keratitis, bilateral: Secondary | ICD-10-CM | POA: Insufficient documentation

## 2012-02-06 DIAGNOSIS — H16129 Filamentary keratitis, unspecified eye: Secondary | ICD-10-CM | POA: Insufficient documentation

## 2012-02-06 DIAGNOSIS — G245 Blepharospasm: Secondary | ICD-10-CM | POA: Insufficient documentation

## 2012-05-29 ENCOUNTER — Ambulatory Visit (HOSPITAL_BASED_OUTPATIENT_CLINIC_OR_DEPARTMENT_OTHER)
Admission: RE | Admit: 2012-05-29 | Discharge: 2012-05-29 | Disposition: A | Discharge status: Home / Self Care | Payer: BLUE CROSS/BLUE SHIELD | Source: Ambulatory Visit | Attending: NURSE PRACTITIONER | Admitting: NURSE PRACTITIONER

## 2012-05-29 DIAGNOSIS — E785 Hyperlipidemia, unspecified: Secondary | ICD-10-CM | POA: Insufficient documentation

## 2012-05-29 DIAGNOSIS — E039 Hypothyroidism, unspecified: Secondary | ICD-10-CM | POA: Insufficient documentation

## 2012-05-29 LAB — COMPREHENSIVE METABOLIC PROFILE - BMC/JMC ONLY
ALBUMIN: 3.3 g/dL (ref 3.2–5.0)
ALKALINE PHOSPHATASE: 44 IU/L (ref 35–120)
ALT (SGPT): 12 IU/L (ref 0–55)
AST (SGOT): 16 IU/L (ref 0–45)
BILIRUBIN, TOTAL: 0.5 mg/dL (ref 0.0–1.3)
BUN: 9 mg/dL (ref 6–22)
CALCIUM: 9.2 mg/dL (ref 8.5–10.5)
CARBON DIOXIDE: 28 mmol/L (ref 22–32)
CHLORIDE: 111 mmol/L (ref 101–111)
CREATININE: 0.66 mg/dL (ref 0.53–1.00)
ESTIMATED GLOMERULAR FILTRATION RATE: >60 mL/min (ref >60)
GLUCOSE: 97 mg/dL (ref 70–110)
POTASSIUM: 4.1 mmol/L (ref 3.5–5.0)
SODIUM: 142 mmol/L (ref 136–145)
TOTAL PROTEIN: 6.3 g/dL (ref 6.0–8.0)

## 2012-05-29 LAB — LIPID PANEL
CHOL/HDL RATIO: 3.3
CHOLESTEROL: 188 mg/dL (ref 120–199)
HDL-CHOLESTEROL: 57 mg/dL (ref >39)
LDL (CALCULATED): 124 mg/dL (ref <130)
TRIGLYCERIDES: 33 mg/dL (ref <150)
VLDL (CALCULATED): 7 mg/dL (ref 5–35)

## 2012-05-29 LAB — TSH,3RD GENERATION: TSH 3RD GENERATION: 2.5 u[IU]/mL (ref 0.340–5.600)

## 2012-10-04 ENCOUNTER — Other Ambulatory Visit (INDEPENDENT_AMBULATORY_CARE_PROVIDER_SITE_OTHER): Payer: Self-pay | Admitting: Obstetrics & Gynecology

## 2012-10-05 ENCOUNTER — Ambulatory Visit (INDEPENDENT_AMBULATORY_CARE_PROVIDER_SITE_OTHER): Payer: Self-pay | Admitting: Obstetrics & Gynecology

## 2012-10-08 ENCOUNTER — Ambulatory Visit (HOSPITAL_BASED_OUTPATIENT_CLINIC_OR_DEPARTMENT_OTHER)
Admission: RE | Admit: 2012-10-08 | Discharge: 2012-10-08 | Disposition: A | Discharge status: Home / Self Care | Payer: BLUE CROSS/BLUE SHIELD | Source: Ambulatory Visit | Attending: Obstetrics & Gynecology | Admitting: Obstetrics & Gynecology

## 2012-10-08 DIAGNOSIS — R922 Inconclusive mammogram: Secondary | ICD-10-CM | POA: Insufficient documentation

## 2012-10-08 DIAGNOSIS — Z1231 Encounter for screening mammogram for malignant neoplasm of breast: Secondary | ICD-10-CM | POA: Insufficient documentation

## 2012-10-14 ENCOUNTER — Ambulatory Visit (INDEPENDENT_AMBULATORY_CARE_PROVIDER_SITE_OTHER): Payer: BLUE CROSS/BLUE SHIELD | Admitting: Obstetrics & Gynecology

## 2012-10-14 ENCOUNTER — Other Ambulatory Visit: Payer: Self-pay

## 2012-10-14 ENCOUNTER — Other Ambulatory Visit (HOSPITAL_BASED_OUTPATIENT_CLINIC_OR_DEPARTMENT_OTHER)
Admission: RE | Admit: 2012-10-14 | Discharge: 2012-10-14 | Disposition: A | Discharge status: Home / Self Care | Payer: BLUE CROSS/BLUE SHIELD | Attending: Obstetrics & Gynecology | Admitting: Obstetrics & Gynecology

## 2012-10-14 ENCOUNTER — Encounter (INDEPENDENT_AMBULATORY_CARE_PROVIDER_SITE_OTHER): Payer: Self-pay | Admitting: Obstetrics & Gynecology

## 2012-10-14 VITALS — BP 102/62 | Temp 98.3°F | Ht 61.0 in | Wt 110.0 lb

## 2012-10-14 DIAGNOSIS — Z1151 Encounter for screening for human papillomavirus (HPV): Secondary | ICD-10-CM | POA: Insufficient documentation

## 2012-10-14 DIAGNOSIS — Z124 Encounter for screening for malignant neoplasm of cervix: Secondary | ICD-10-CM | POA: Insufficient documentation

## 2012-10-14 NOTE — Progress Notes (Signed)
[facility]  [address]  [place], [place]  [address]  [phone]      HPI Comments:    57 y.o. [phone]    Chief Complaint   Patient presents with   . Annual Pap     For annual exam. Does monthly breast exams. No complaints today. Still taking Hrt and wants to continue.    Review of Systems :  Constitutional: Negative.    Skin: Negative.    HENT: Negative.    Cardiovascular: Negative.    Respiratory: Negative.    Gastrointestinal: Negative.    Genitourinary:        See HPI   Musculoskeletal: Negative.    Neurological: Negative.    Psychiatric: Negative.      Patient's PMH/PSH/FH/SH/Meds/Allg were all reviewed and noted in the EPIC chart on today's visit.    Objective:  BP 102/62   Temp(Src) 36.8 C (98.3 F) (Oral)   Ht 1.549 m (5\' 1" )   Wt 49.896 kg (110 lb)   BMI 20.8 kg/m2    Physical Exam:    Constitutional: She is oriented. She appears well-developed and well-nourished.     HENT: Head: Normocephalic.     Abdomen: She exhibits no distension. Soft. No tenderness. She has no rebound and no guarding.     Neurological: She is alert and oriented.    Psychiatric: She has a normal mood and affect. Her behavior is normal.     Breast exam:   Right normal breast; with no masses, skin changes or nipple discharge. No tenderness to right breast.   Left normal breast; with no masses, skin changes or nipple discharge.  No tenderness to left breast.   GU:  External Exam: External exam normal.    No urethral tenderness.  No genital lesions present.    Vaginal Exam:  Vagina normal to exam.   Bladder:  Bladder is normal to exam. Suprapubic fullness not present.    Uterus: Uterus is normal to exam.  Cervix is normal to exam.  Position: anteverted  Contour:  Regular.    Mobility:  Mobile.    Adnexa:  No masses and no tenderness.        Assessment & Plan:     ICD-9-CM    1. Screening for malignant neoplasm of the cervix V76.2 CYTOPATHOLOGY-GYN (PAP AND HPV TESTS)    2. Routine gynecological examination V72.31 CYTOPATHOLOGY-GYN (PAP AND HPV TESTS)     MAMMO SCREENING BILATERAL W/ TOMO       Return in about 1 year (around 10/14/2013).  Continue present  regime

## 2012-10-18 LAB — HISTORICAL CYTOPATHOLOGY-GYN (PAP AND HPV TESTS)

## 2012-10-19 LAB — HPV: HPV MRNA E6/E7: NOT DETECTED

## 2012-10-21 ENCOUNTER — Ambulatory Visit (HOSPITAL_BASED_OUTPATIENT_CLINIC_OR_DEPARTMENT_OTHER)
Admission: RE | Admit: 2012-10-21 | Discharge: 2012-10-21 | Disposition: A | Discharge status: Home / Self Care | Payer: Self-pay | Source: Ambulatory Visit | Attending: Emergency Medicine | Admitting: Emergency Medicine

## 2012-10-21 LAB — LIPID PANEL
CHOL/HDL RATIO: 3.5
CHOLESTEROL: 215 mg/dL — ABNORMAL HIGH (ref 120–199)
HDL-CHOLESTEROL: 61 mg/dL (ref >39)
LDL (CALCULATED): 142 mg/dL — ABNORMAL HIGH (ref <130)
TRIGLYCERIDES: 60 mg/dL — AB (ref <150)
VLDL (CALCULATED): 12 mg/dL (ref 5–35)

## 2012-10-21 LAB — GLUCOSE - BMC/JMC ONLY: GLUCOSE: 81 mg/dL (ref 70–110)

## 2012-11-02 ENCOUNTER — Encounter (INDEPENDENT_AMBULATORY_CARE_PROVIDER_SITE_OTHER): Payer: Self-pay | Admitting: Obstetrics & Gynecology

## 2012-11-07 NOTE — Op Note (Signed)
[facility]                               OPERATIVE REPORT         NAME: Melody Hendricks, Melody Hendricks               ADM DATE:   09/08/2011       MR#:  [id number]                            ACCT#:      [id number]       DOB:  04/29/1956       _________________________________________________________________       DATE OF SURGERY:       09/08/2011         PREOPERATIVE DIAGNOSES:       Soft tissue mass/ganglion, left posterior heel.         POSTOPERATIVE DIAGNOSES:       Soft tissue mass/ganglion, left posterior heel.         PROCEDURE:       Excision ganglion, left posterior heel.         ANESTHESIA:       Sedation with local infiltration of approximately 5 cc of a       50:50 mix of 1% lidocaine plain and 0.50% Marcaine plain in a       diamond block around the lesion.  Hemostasis was achieved by the       use of an ankle cuff tourniquet set at 250 mmHg for       approximately 20 minutes.         ESTIMATED BLOOD LOSS:       Less than 10 cc.         COMPLICATIONS:       None.         INDICATIONS OF PROCEDURE:       Melody Hendricks is a pleasant 57 year old white female, who has       struggled with a tender soft tissue mass on the posterior aspect       of her left heel.  This has made the shoes extremely difficult       to wear as it rubs and gets irritated.  She has changed her shoe       gear appropriately, but this offers her a little relief.  I have       reviewed operative intervention with her at length and I have       outlined possible risks associated with excision of this lesion.       These included but were not limited to infection, slow healing,       thick scar, continued pain, recurrence anesthesia risks to name       a few.  She clearly understood.  There were no contraindications       to her surgery based on her preoperative history and physical       exam, which I reviewed and there was no change in her lower       extremity exam from last time I evaluated her in the  office.         DESCRIPTION OF PROCEDURE:       Melody Hendricks was taken to the OR, placed on the operating room table       in the prone position.  After intravenous  sedation had been       achieved, the area of the left posterior heel was blocked with       the above-mentioned local anesthetics.  An ankle cuff tourniquet       was then applied and the foot was prepped and draped in usual       sterile fashion.  The foot was then elevated for 3 minutes,       exsanguinated with an Esmarch bandage, and the cuff was raised       to 250 mmHg.         Attention was then directed to the posterior aspect of the left       heel.  Just inferior to the inferior most attachment of the         Achilles tendon, there was a small hard soft tissue mass, which       measured approximately 1 cm x 1 cm x 0.5 cm.  It was freely       movable under the skin.  I made an incision with a 15 blade,       which was approximately one-half inch to one inch long.  This       was done with a 15 blade carried through the skin into the       subcutaneous tissue, paying careful attention to all bleeders,       which were bovied and clamped as necessary.  Immediately upon       entering the subcutaneous tissue, there was a hard, clear,       bubble like lesion, which arose from within the insertion of the       Achilles.  I isolated its margins and then carefully dissected       it out with a small pair of dissecting scissors.  On attempted       removal, the capsule was punctured and a small amount of clear       gelatinous fluid, which was thick and jelly like expressed from       the lesion.  I excised in toto and sent it to Pathology.  I used       a Bovie to cauterize the base.  The wound was then flushed with       copious amounts of sterile saline.  Subcutaneous closure was       then achieved with 4-0 Vicryl simple interrupted sutures.  Skin       was closed with horizontal mattress and simple interrupted type       stitches.  The wound was  dressed with a saline-soaked 4x4,       sterile 4x4s, 2-inch Kerlix, Coban, and a 4-inch Ace wrap.  The       patient tolerated the procedure and anesthesia well, left the OR       for Recovery with vital signs stable.  Excellent capillary       refill to all toes on the left foot.  Patient was given strict       written and oral postoperative instructions.  The patient was       told that she could weight bear to tolerance.  Patient was given       script for Tylenol No.3, 1 tab q.4 h. p.r.n. pain #25 and Motrin       600 mg t.i.d. p.o. with food #25.  Patient was told to call if  she has any difficulties.  I will recheck her next week on       Monday.                                  **PRELIMINARY REPORT UNLESS SIGNED**                          SEE DOCUMENT IMAGING SYSTEM FOR FINAL REPORT                                            ________________________________                                        Lamar Sprinkles II, DPM               ID:   [id number]       DocType:   [id number]       \:   SY       /:   224       DD:  09/09/2011 11:47:32       DT:  09/10/2011 04:22:24       JOB: [id number]       CC:  JUSTIN GLASSFORD ([id number])            Daeveon Zweber E Hosie Sharman II ([id number])         >  Authenticated and Edited by Marlis Edelson, MD On 09/18/11 8:16:58 AM

## 2012-12-01 ENCOUNTER — Other Ambulatory Visit (INDEPENDENT_AMBULATORY_CARE_PROVIDER_SITE_OTHER): Payer: Self-pay | Admitting: Obstetrics & Gynecology

## 2012-12-03 ENCOUNTER — Telehealth (INDEPENDENT_AMBULATORY_CARE_PROVIDER_SITE_OTHER): Payer: Self-pay | Admitting: Obstetrics & Gynecology

## 2012-12-03 NOTE — Telephone Encounter (Signed)
Pt would like a refill on her prempro

## 2012-12-05 MED ORDER — CONJ ESTROGEN-MEDROXYPROGESTERONE 0.45 MG-1.5 MG TABLET
1.0000 | ORAL_TABLET | Freq: Every day | ORAL | Status: DC
Start: 2012-12-01 — End: 2014-11-22

## 2012-12-05 MED ORDER — CONJ ESTROGEN-MEDROXYPROGESTERONE 0.3 MG-1.5 MG TABLET
1.0000 | ORAL_TABLET | Freq: Every day | ORAL | Status: DC
Start: 2012-12-05 — End: 2013-12-09
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
## 2013-04-10 ENCOUNTER — Other Ambulatory Visit: Payer: Self-pay

## 2013-04-17 ENCOUNTER — Encounter (INDEPENDENT_AMBULATORY_CARE_PROVIDER_SITE_OTHER): Payer: Self-pay | Admitting: Obstetrics & Gynecology

## 2013-05-20 ENCOUNTER — Ambulatory Visit
Admission: RE | Admit: 2013-05-20 | Discharge: 2013-05-20 | Disposition: A | Discharge status: Home / Self Care | Payer: BLUE CROSS/BLUE SHIELD | Source: Ambulatory Visit | Attending: NURSE PRACTITIONER | Admitting: NURSE PRACTITIONER

## 2013-05-20 DIAGNOSIS — E785 Hyperlipidemia, unspecified: Secondary | ICD-10-CM | POA: Insufficient documentation

## 2013-05-20 DIAGNOSIS — E039 Hypothyroidism, unspecified: Secondary | ICD-10-CM | POA: Insufficient documentation

## 2013-05-20 LAB — COMPREHENSIVE METABOLIC PROFILE - BMC/JMC ONLY
ALBUMIN: 3.6 g/dL (ref 3.2–5.0)
ALKALINE PHOSPHATASE: 49 IU/L (ref 35–120)
ALT (SGPT): 14 IU/L (ref 0–55)
AST (SGOT): 21 IU/L (ref 0–45)
BILIRUBIN, TOTAL: 0.6 mg/dL (ref 0.0–1.3)
BUN: 9 mg/dL (ref 6–22)
CALCIUM: 9.2 mg/dL (ref 8.5–10.5)
CARBON DIOXIDE: 28 mmol/L (ref 22–32)
CHLORIDE: 109 mmol/L (ref 101–111)
CREATININE: 0.71 mg/dL (ref 0.53–1.00)
ESTIMATED GLOMERULAR FILTRATION RATE: >60 mL/min (ref >60)
GLUCOSE: 94 mg/dL (ref 70–110)
POTASSIUM: 4.1 mmol/L (ref 3.5–5.0)
SODIUM: 142 mmol/L (ref 136–145)
TOTAL PROTEIN: 6.6 g/dL (ref 6.0–8.0)

## 2013-05-20 LAB — LIPID PANEL
CHOL/HDL RATIO: 4
CHOLESTEROL: 206 mg/dL — ABNORMAL HIGH (ref 120–199)
LDL (CALCULATED): 143 mg/dL — ABNORMAL HIGH (ref <130)
TRIGLYCERIDES: 55 mg/dL (ref <150)
VLDL (CALCULATED): 11 mg/dL (ref 5–35)

## 2013-08-18 ENCOUNTER — Other Ambulatory Visit: Payer: Self-pay | Admitting: Internal Medicine

## 2013-08-18 DIAGNOSIS — E041 Nontoxic single thyroid nodule: Secondary | ICD-10-CM

## 2013-08-22 ENCOUNTER — Ambulatory Visit
Admission: RE | Admit: 2013-08-22 | Discharge: 2013-08-22 | Disposition: A | Discharge status: Home / Self Care | Payer: 59 | Source: Ambulatory Visit | Attending: Internal Medicine | Admitting: Internal Medicine

## 2013-08-22 DIAGNOSIS — E041 Nontoxic single thyroid nodule: Secondary | ICD-10-CM

## 2013-09-14 ENCOUNTER — Ambulatory Visit: Payer: 59 | Admitting: Obstetrics & Gynecology

## 2013-10-06 ENCOUNTER — Ambulatory Visit: Payer: 59 | Admitting: Obstetrics & Gynecology

## 2013-10-07 ENCOUNTER — Ambulatory Visit (INDEPENDENT_AMBULATORY_CARE_PROVIDER_SITE_OTHER): Payer: 59 | Admitting: Obstetrics & Gynecology

## 2013-10-07 ENCOUNTER — Encounter: Payer: Self-pay | Admitting: Obstetrics & Gynecology

## 2013-10-07 VITALS — BP 182/91 | HR 79 | Temp 97.8°F | Ht 67.0 in

## 2013-10-07 DIAGNOSIS — Z01419 Encounter for gynecological examination (general) (routine) without abnormal findings: Secondary | ICD-10-CM

## 2013-10-07 NOTE — Progress Notes (Signed)
Subjective:     Julia Ware is a 57 y.o. female here for a routine exam.  Personal health questionnaire:   Is patient Ashkenazi Jewish, have a family history of breast and/or ovarian cancer: no Is there a family history of uterine cancer diagnosed at age < 17, gastrointestinal cancer, urinary tract cancer, family member who is a Field seismologist syndrome-associated carrier: yes   Gynecologic History No LMP recorded. Patient has had a hysterectomy.Indication: uterine fibroids Last mammogram: 1 yr ago. Results were: normal  Obstetric History OB History  Gravida Para Term Preterm AB SAB TAB Ectopic Multiple Living  2 2 2       2     # Outcome Date GA Lbr Len/2nd Weight Sex Delivery Anes PTL Lv  2 TRM 01/10/74 [redacted]w[redacted]d  3.827 kg (8 lb 7 oz) F SVD None  Y  1 TRM 06/30/72 [redacted]w[redacted]d  3.402 kg (7 lb 8 oz) F SVD None  Y      History reviewed. HTN, previous hysterectoy  Current outpatient prescriptions:lisinopril-hydrochlorothiazide (PRINZIDE,ZESTORETIC) 20-12.5 MG per tablet, Take 2 tablets by mouth daily., Disp: , Rfl:  No Known Allergies  History  Substance Use Topics  . Smoking status: Never Smoker   . Smokeless tobacco: Never Used  . Alcohol Use: Yes     Comment: social     Family History  Problem Relation Age of Onset  . Diabetes Mother   . Hypertension Mother   . Kidney disease Father   . Hypertension Father   . Hyperlipidemia Father       Review of Systems  Constitutional: negative for fatigue and weight loss Respiratory: negative for cough and wheezing Cardiovascular: negative for chest pain, fatigue and palpitations Gastrointestinal: negative for abdominal pain and change in bowel habits Musculoskeletal:negative for myalgias Neurological: negative for gait problems and tremors Behavioral/Psych: negative for abusive relationship, depression Endocrine: negative for temperature intolerance   Genitourinary:negative for abnormal menstrual periods, genital lesions, hot flashes,  sexual problems and vaginal discharge Integument/breast: negative for breast lump, breast tenderness, nipple discharge and skin lesion(s)    Objective:       General:   alert  Skin:   no rash or abnormalities  Lungs:   clear to auscultation bilaterally  Heart:   regular rate and rhythm, S1, S2 normal, no murmur, click, rub or gallop  Breasts:   normal without suspicious masses, skin or nipple changes or axillary nodes  Abdomen:  normal findings: no organomegaly, soft, non-tender and no hernia  Pelvis:  External genitalia: normal general appearance Urinary system: urethral meatus normal and bladder without fullness, nontender Vaginal: normal without tenderness, induration or masses Cervix: absent Adnexa: normal bimanual exam Uterus: absent   Lab Review  Labs reviewed no Radiologic studies reviewed no     Assessment:    Healthy female exam.    Plan:    Education reviewed: calcium supplements and weight bearing exercise.   Follow up as needed.

## 2013-10-10 ENCOUNTER — Other Ambulatory Visit (INDEPENDENT_AMBULATORY_CARE_PROVIDER_SITE_OTHER): Payer: Self-pay | Admitting: Obstetrics & Gynecology

## 2013-10-10 DIAGNOSIS — Z1231 Encounter for screening mammogram for malignant neoplasm of breast: Secondary | ICD-10-CM

## 2013-10-14 ENCOUNTER — Ambulatory Visit (HOSPITAL_BASED_OUTPATIENT_CLINIC_OR_DEPARTMENT_OTHER)
Admission: RE | Admit: 2013-10-14 | Discharge: 2013-10-14 | Disposition: A | Discharge status: Home / Self Care | Payer: BLUE CROSS/BLUE SHIELD | Source: Ambulatory Visit | Attending: Obstetrics & Gynecology | Admitting: Obstetrics & Gynecology

## 2013-10-14 DIAGNOSIS — Z1231 Encounter for screening mammogram for malignant neoplasm of breast: Secondary | ICD-10-CM | POA: Insufficient documentation

## 2013-10-14 DIAGNOSIS — Z01419 Encounter for gynecological examination (general) (routine) without abnormal findings: Secondary | ICD-10-CM

## 2013-10-18 ENCOUNTER — Encounter (INDEPENDENT_AMBULATORY_CARE_PROVIDER_SITE_OTHER): Payer: Self-pay | Admitting: Obstetrics & Gynecology

## 2013-10-18 ENCOUNTER — Other Ambulatory Visit (HOSPITAL_BASED_OUTPATIENT_CLINIC_OR_DEPARTMENT_OTHER)
Admission: RE | Admit: 2013-10-18 | Discharge: 2013-10-18 | Disposition: A | Discharge status: Home / Self Care | Payer: BLUE CROSS/BLUE SHIELD | Attending: Obstetrics & Gynecology | Admitting: Obstetrics & Gynecology

## 2013-10-18 ENCOUNTER — Other Ambulatory Visit (HOSPITAL_COMMUNITY): Payer: Self-pay | Admitting: Obstetrics & Gynecology

## 2013-10-18 ENCOUNTER — Ambulatory Visit (INDEPENDENT_AMBULATORY_CARE_PROVIDER_SITE_OTHER): Payer: BLUE CROSS/BLUE SHIELD | Admitting: Obstetrics & Gynecology

## 2013-10-18 VITALS — BP 118/72 | Temp 97.9°F | Ht 61.0 in | Wt 114.0 lb

## 2013-10-18 DIAGNOSIS — Z124 Encounter for screening for malignant neoplasm of cervix: Secondary | ICD-10-CM

## 2013-10-18 DIAGNOSIS — Z01419 Encounter for gynecological examination (general) (routine) without abnormal findings: Secondary | ICD-10-CM

## 2013-10-18 NOTE — Progress Notes (Signed)
[facility]  [address]  [place], [place]  [address]  [phone]      HPI Comments:    58 y.o. [phone]    Chief Complaint   Patient presents with    Annual Pap     For annual exam and pap. Does monthly breast exams. No problems today. Busy at work.    Review of Systems :  Constitutional: Negative.    Skin: Negative.    HENT: Negative.    Cardiovascular: Negative.    Respiratory: Negative.    Gastrointestinal: Negative.    Genitourinary:        See HPI   Musculoskeletal: Negative.    Neurological: Negative.    Psychiatric: Negative.      Patient's PMH/PSH/FH/SH/Meds/Allg were all reviewed and noted in the EPIC chart on today's visit.    Objective:  BP 118/72    Temp(Src) 36.6 C (97.9 F) (Oral)    Ht 1.549 m (5\' 1" )    Wt 51.71 kg (114 lb)    BMI 21.55 kg/m2       Physical Exam:    Constitutional: She is oriented. She appears well-developed and well-nourished.     HENT: Head: Normocephalic.     Abdomen: She exhibits no distension. Soft. No tenderness. She has no rebound and no guarding.     Neurological: She is alert and oriented.    Psychiatric: She has a normal mood and affect. Her behavior is normal.     Breast exam:   Right normal breast; with no masses, skin changes or nipple discharge. No tenderness to right breast.   Left normal breast; with no masses, skin changes or nipple discharge.  No tenderness to left breast.   GU:  External Exam: External exam normal.    No urethral tenderness.  No genital lesions present.    Vaginal Exam:  Vagina normal to exam.   Bladder:  Bladder is normal to exam. Suprapubic fullness not present.    Uterus: Uterus is normal to exam.  Cervix is normal to exam.  Position:antverted   Contour:  Regular.    Mobility:  Mobile.    Adnexa:  No masses and no tenderness.        Assessment & Plan:     ICD-9-CM    1. Encounter for gynecological examination with Papanicolaou smear of cervix V72.31 MAMMO SCREENING BILATERAL W/ TOMO    V76.2  CYTOPATHOLOGY-GYN (PAP AND HPV TESTS)       Return in about 1 year (around 10/19/2014).   Advise o findings  Continue routine

## 2013-10-20 ENCOUNTER — Ambulatory Visit (HOSPITAL_BASED_OUTPATIENT_CLINIC_OR_DEPARTMENT_OTHER)
Admission: RE | Admit: 2013-10-20 | Discharge: 2013-10-20 | Disposition: A | Discharge status: Home / Self Care | Payer: Self-pay | Source: Ambulatory Visit | Attending: Internal Medicine | Admitting: Internal Medicine

## 2013-10-20 DIAGNOSIS — Z0289 Encounter for other administrative examinations: Secondary | ICD-10-CM | POA: Insufficient documentation

## 2013-10-20 LAB — LIPID PANEL
CHOL/HDL RATIO: 3.5
CHOLESTEROL: 204 mg/dL — ABNORMAL HIGH (ref 120–199)
HDL-CHOLESTEROL: 59 mg/dL (ref >39)
LDL (CALCULATED): 134 mg/dL — ABNORMAL HIGH (ref <130)
TRIGLYCERIDES: 54 mg/dL (ref <150)
VLDL (CALCULATED): 11 mg/dL (ref 5–35)

## 2013-10-20 LAB — HISTORICAL CYTOPATHOLOGY-GYN (PAP AND HPV TESTS)

## 2013-11-16 ENCOUNTER — Ambulatory Visit (INDEPENDENT_AMBULATORY_CARE_PROVIDER_SITE_OTHER): Payer: Self-pay | Admitting: GASTROENTEROLOGY

## 2013-11-16 ENCOUNTER — Other Ambulatory Visit (INDEPENDENT_AMBULATORY_CARE_PROVIDER_SITE_OTHER): Payer: Self-pay | Admitting: GASTROENTEROLOGY

## 2013-11-16 ENCOUNTER — Encounter (INDEPENDENT_AMBULATORY_CARE_PROVIDER_SITE_OTHER): Payer: Self-pay | Admitting: GASTROENTEROLOGY

## 2013-11-17 ENCOUNTER — Ambulatory Visit
Admission: RE | Admit: 2013-11-17 | Discharge: 2013-11-17 | Disposition: A | Discharge status: Home / Self Care | Payer: BLUE CROSS/BLUE SHIELD | Source: Ambulatory Visit | Attending: NURSE PRACTITIONER | Admitting: NURSE PRACTITIONER

## 2013-11-17 DIAGNOSIS — I7300 Raynaud's syndrome without gangrene: Secondary | ICD-10-CM | POA: Insufficient documentation

## 2013-11-17 DIAGNOSIS — J3089 Other allergic rhinitis: Secondary | ICD-10-CM | POA: Insufficient documentation

## 2013-11-17 DIAGNOSIS — E785 Hyperlipidemia, unspecified: Secondary | ICD-10-CM | POA: Insufficient documentation

## 2013-11-17 LAB — COMPREHENSIVE METABOLIC PROFILE - BMC/JMC ONLY
ALBUMIN/GLOBULIN RATIO: 1.3
ALKALINE PHOSPHATASE: 48 IU/L (ref 35–120)
ALT (SGPT): 14 IU/L (ref 0–55)
ANION GAP: 7 mmol/L (ref 3–11)
AST (SGOT): 20 IU/L (ref 0–45)
BILIRUBIN, TOTAL: 0.7 mg/dL (ref 0.0–1.3)
BUN: 12 mg/dL (ref 6–22)
CALCIUM: 8.9 mg/dL (ref 8.5–10.5)
CARBON DIOXIDE: 28 mmol/L (ref 22–32)
CHLORIDE: 107 mmol/L (ref 101–111)
CREATININE: 0.77 mg/dL (ref 0.53–1.00)
ESTIMATED GLOMERULAR FILTRATION RATE: >60 mL/min (ref >60)
POTASSIUM: 4.1 mmol/L (ref 3.5–5.0)
SODIUM: 142 mmol/L (ref 136–145)
TOTAL PROTEIN: 6.2 g/dL (ref 6.0–8.0)

## 2013-11-17 LAB — LIPID PANEL
CHOL/HDL RATIO: 3.7
CHOLESTEROL: 206 mg/dL — ABNORMAL HIGH (ref 120–199)
HDL-CHOLESTEROL: 56 mg/dL (ref >39)
HDL-CHOLESTEROL: 56 mg/dL (ref >39)
TRIGLYCERIDES: 49 mg/dL (ref <150)
VLDL (CALCULATED): 10 mg/dL (ref 5–35)

## 2013-11-25 ENCOUNTER — Ambulatory Visit (INDEPENDENT_AMBULATORY_CARE_PROVIDER_SITE_OTHER): Payer: Self-pay | Admitting: Obstetrics & Gynecology

## 2013-11-25 NOTE — Telephone Encounter (Signed)
Pt seen for her annual and requesting rf on prempro sent to [facility].  prempro 0.45-1.5mg  1 tablet by mouth daily dispense 90 day supply with 3 refills called in to [facility].

## 2013-12-09 ENCOUNTER — Ambulatory Visit (INDEPENDENT_AMBULATORY_CARE_PROVIDER_SITE_OTHER): Payer: BLUE CROSS/BLUE SHIELD | Admitting: GASTROENTEROLOGY

## 2013-12-09 ENCOUNTER — Encounter (INDEPENDENT_AMBULATORY_CARE_PROVIDER_SITE_OTHER): Payer: Self-pay | Admitting: GASTROENTEROLOGY

## 2013-12-09 VITALS — BP 120/76 | HR 96 | Temp 97.8°F | Ht 61.0 in | Wt 111.0 lb

## 2013-12-09 DIAGNOSIS — Z1159 Encounter for screening for other viral diseases: Secondary | ICD-10-CM

## 2013-12-09 DIAGNOSIS — Z86010 Personal history of colonic polyps: Secondary | ICD-10-CM

## 2013-12-09 MED ORDER — SODIUM,POTASSIUM,MAG SULFATES 17.5 GRAM-3.13 GRAM-1.6 GRAM ORAL SOLN
ORAL | Status: DC
Start: 2013-12-09 — End: 2016-02-05

## 2013-12-09 NOTE — H&P (Signed)
[place], [place] [address]  [phone]    Encounter Date: 12/09/2013     Name: Gina Barrett  Age: 58 y.o.  DOB: 04/25/1956  Sex: female    Impression: History of polyps    Recommendations: Proceed with colonoscopy to rule out any metachronous polyps or lesions    Chief Complaint/HPI: This is a pleasant 58 year old female who had a colonoscopy 6 years ago had some polyps and is here for surveillance examination no chest pain or shortness of breath another only no dyspepsia dysphagia melena hematochezia constipation or diarrhea  Chief Complaint   Patient presents with    Colonoscopy     past patient, last screening 5 yrs       History:  Family History   Problem Relation Age of Onset    Diabetes Paternal Grandfather     Diabetes Brother     Breast Cancer Neg Hx     Colon Cancer Neg Hx     Rectal Cancer Neg Hx     Uterine Cancer Neg Hx     Heart Disease Neg Hx     Hypertension Neg Hx      Past Medical History   Diagnosis Date    Raynaud's disease     Unspecified disorder of thyroid     STD (sexually transmitted disease)      HSV    Abnormal Pap smear      ASCUS, ASCUS cannot rule out HGSIL     Past Surgical History   Procedure Laterality Date    Hx tubal ligation  1978    Pb forearm/wrist surgery unlisted  1999     carpel release    Hx foot surgery      Hx elbow surgery       There are no active problems to display for this patient.    History   Smoking status    Former Smoker -- 20 years   Smokeless tobacco    Not on file     History   Alcohol Use No     History   Drug Use Not on file     Current Outpatient Prescriptions   Medication Sig    Conj Estrog-Medroxyprogest Ace (PREMPRO) 0.45-1.5 mg Oral Tablet Take 1 Tab by mouth Once a day    cycloSPORINE (RESTASIS) 0.05 % Ophthalmic Dropperette Instill 1 Drop into both eyes Every 12 hours    levothyroxine (SYNTHROID) 88 mcg Oral Tablet take 88 mcg by mouth Once a day.    loratadine  (CLARITIN) 10 mg Oral Tablet take 10 mg by mouth Once a day.    Nifedipine (ADALAT CC) 30 mg Oral Tablet Sustained Release take 30 mg by mouth Once a day.    OMEGA-3 FATTY ACIDS (FISH OIL ORAL) take  by mouth Once a day.    Sodium,Potassium,&Mag Sulfates (SUPREP BOWEL PREP KIT) 17.5-3.13-1.6 gram Oral Recon Soln USE AS DOCTOR DIRECTED       Review of Systems:  Normal  HEENT:  No sore throat, no occular pain.  Respiratory:   No cough or SOB.  Cardiovascular: no chest pain or palpitation.  GI: See HPI  GU:  No S/S of dysuria, flank pain or urinary retention.  Neuro: No headaches, dizziness or weakness.  Hematologic: No fever, night sweats or chills.  Rheum: no joint pain.   Skin: no rash no purites no jaundice.  Constitutional: no unintentional weight loss.    Physical  Exam  BP 120/76    Pulse 96    Temp(Src) 36.6 C (97.8 F)    Ht 1.549 m (5' 1" )    Wt 50.349 kg (111 lb)    BMI 20.98 kg/m2     Body mass index is 20.98 kg/(m^2).  General:  Alert and oriented, no distress.  HEENT: Unremarkable,   Pulmonary:  Clear   Cardiovascular:  normal  Gastrointestinal: Bowel sounds positive soft nontender nondistended organomegaly no ascites  Musculoskeletal:  Bilateral extremities- no cyanosis, clubbing or edema.   Skin:  no rash.  Neurologic:  Grossly non focal.    Risks:  Patient is aware and understands the below risks:  Failure to do workup and have follow up could result in missed cancer.   Risk of EGD or colonoscopy:  Bleeding and infection, 3% chance missed cancer, 15% chance missed polyps, 1 in 5000 chance of perforation which could lead to surgery and lead to permanent disability and/or death.    Labs:    Results for orders placed during the hospital encounter of 11/17/13 (from the past 2016 hour(s))   COMPREHENSIVE METABOLIC PROFILE - [facility] ONLY    Collection Time     11/17/13  7:25 AM       Result Value Range    GLUCOSE 91  70 - 110 mg/dL    BUN 12  6 - 22 mg/dL    CREATININE 0.77  0.53 - 1.00 mg/dL    ESTIMATED  GLOMERULAR FILTRATION RATE >60  >60 ml/min    SODIUM 142  136 - 145 mmol/L    POTASSIUM 4.1  3.5 - 5.0 mmol/L    CHLORIDE 107  101 - 111 mmol/L    CARBON DIOXIDE 28  22 - 32 mmol/L    ANION GAP 7  3 - 11 mmol/L    CALCIUM 8.9  8.5 - 10.5 mg/dL    TOTAL PROTEIN 6.2  6.0 - 8.0 g/dL    ALBUMIN 3.6  3.2 - 5.0 g/dL    ALBUMIN/GLOBULIN RATIO 1.3      BILIRUBIN, TOTAL 0.7  0.0 - 1.3 mg/dL    AST (SGOT) 20  0 - 45 IU/L    ALT (SGPT) 14  0 - 55 IU/L    ALKALINE PHOSPHATASE 48  35 - 120 IU/L   LIPID PANEL    Collection Time     11/17/13  7:25 AM       Result Value Range    CHOLESTEROL 206 (*) 120 - 199 mg/dL    TRIGLYCERIDES 49  <150 mg/dL    HDL-CHOLESTEROL 56  >39 mg/dL    LDL (CALCULATED) 140 (*) <130 mg/dL    VLDL (CALCULATED) 10  5 - 35 mg/dL    CHOL/HDL RATIO 3.7     Results for orders placed during the hospital encounter of 10/20/13 (from the past 2016 hour(s))   GLUCOSE - [facility] ONLY    Collection Time     10/20/13 10:09 AM       Result Value Range    GLUCOSE 94  70 - 110 mg/dL   LIPID PANEL    Collection Time     10/20/13 10:09 AM       Result Value Range    CHOLESTEROL 204 (*) 120 - 199 mg/dL    TRIGLYCERIDES 54  <150 mg/dL    HDL-CHOLESTEROL 59  >39 mg/dL    LDL (CALCULATED) 134 (*) <130 mg/dL    VLDL (CALCULATED) 11  5 - 35  mg/dL    CHOL/HDL RATIO 3.5          Assessment and Plan       ICD-9-CM    1. Need for hepatitis C screening test V73.89 CBC W/ AUTOMATED DIFF - [facility] ONLY     HCV ANTIBODY SCREEN (IN-HOUSE) - [facility] ONLY     [facility] ENDOSCOPY REQUEST (AMB)     Sodium,Potassium,&Mag Sulfates (SUPREP BOWEL PREP KIT) 17.5-3.13-1.6 gram Oral Recon Soln   2. Personal history of colonic polyps V12.72 CBC W/ AUTOMATED DIFF - [facility] ONLY     HCV ANTIBODY SCREEN (IN-HOUSE) - [facility] ONLY     [facility] ENDOSCOPY REQUEST (AMB)     Sodium,Potassium,&Mag Sulfates (SUPREP BOWEL PREP KIT) 17.5-3.13-1.6 gram Oral Recon Soln     Return if symptoms worsen or fail to improve.    [place], DO 12/09/2013, 10:50

## 2013-12-09 NOTE — Patient Instructions (Signed)
Colorectal Cancer Screening  Colorectal cancer screening is done to detect early disease. Colorectal refers to the colon and rectum. The colon and rectum are located at the end of the large intestine (digestive system), and carry your bowel movements out of the body. Screening may be done even if you are not experiencing symptoms.   Colorectal cancer screening checks for:   Polyps. These are small growths in the lining of the colon that can turn cancerous.   Cancer that is already growing. Cancer is a cluster of abnormal cells that can cause problems in the body.  REASONS FOR COLORECTAL CANCER SCREENING   It is common for polyps to form in the lining of the colon, especially in older people. These polyps can be cancerous or become cancerous.   Caught early, colorectal cancer is treatable.   Cancer can be life threatening. Detecting or preventing cancer early can save your life and allow you to enjoy life longer.  TYPES OF SCREENING   Fecal occult blood testing. A stool sample is examined for blood in the laboratory.   Sigmoidoscopy. A sigmoidoscope is used to examine the rectum and lower colon. A sigmoidoscope is a flexible tube with a camera that is inserted through your anus to examine your lower rectum.   Colonoscopy. The longer colonoscope is used to examine the entire colon. A colonoscope is also a thin, flexible tube with a camera. This test examines the colon and rectum.  Other tests include:   Digital rectal exam.   Barium enema.   Stool DNA test.   Virtual colonoscopy is the use of computerized X-ray scan (computed tomography, CT) to take X-ray images of your colon.  WHO SHOULD HAVE COLORECTAL CANCER SCREENING?   Screening is recommended for all adults aged 44 to 62 years.   Screening is generally done every 5 to 10 years or more frequently if you have a family history or symptoms.   Screening is rarely recommended in adults aged 32 to 40 years. Screening is not recommended in adults aged 9  years and older.  Your caregiver may recommend screening at a younger age and more frequent screening if you have:   A history of colorectal cancer or polyps.   Family members with histories of colorectal cancer or polyps.   Inflammatory bowel disease, such as ulcerative colitis or Crohn's disease.   A type of hereditary colon cancer syndrome.  Talk with your caregiver about any symptoms, personal and family history.  SYMPTOMS OF COLORECTAL CANCER  It is important to discuss the following symptoms with your caregiver. These symptoms may be the result of other conditions and may be easily treated:   Rectal bleeding.   Blood in your stool.   Changes in bowel movements (hard or loose stools). These changes may last several weeks.   Abdominal cramping.   Feeling the pressure to have a bowel movement when there is no bowel movement.   Feeling tired or weak.   Unexplained weight loss.   Unexplained low red blood cell count. This may also be called iron deficiency anemia.  HOME CARE INSTRUCTIONS    Follow up with your caregiver as directed.   Follow all instructions for preparation before your test as well as after.  PREVENTION   Following healthy lifestyle habits each day can reduce your chance of getting colorectal cancer and many other types of cancer:   Eat a healthy, well-balanced diet rich in fruits and vegetables and low in fats, sugars and cholesterol.  Stay active. Try to exercise at least 4 to 6 times per week for 30 minutes.   Maintain a healthy weight. Ask your caregiver what a healthy weight range is for you.   Women should only drink 1 alcoholic drink per day. Men should only drink 2 alcoholic drinks per day.   Quit smoking.  SEEK MEDICAL CARE IF:    You experience abdominal or rectal symptoms (see Symptoms of Colorectal Cancer).   Your gastrointestinal issues (constipation, diarrhea) do not go away as expected.   You have questions or concerns.  FOR MORE INFORMATION   American Academy  of Family Physicians www.familydoctor.org   Centers for Disease Control and Prevention http://www.wolf.info/   [place] Preventive Services Task Force www.uspreventiveservicestaskforce.[place] www.cancer.org  MAKE SURE YOU:    Understand these instructions.   Will watch your condition.   Will get help right away if you are not doing well or get worse.  Always follow up with your caregiver to find out the results of your tests. Not all test results may be available during your visit. If your test results are not back during the visit, make an appointment with your caregiver to find out the results. Do not assume everything is normal if you have not heard from your caregiver or the medical facility. It is important for you to follow up on all of your test results.   Document Released: 11/20/2009 Document Revised: 08/25/2011 Document Reviewed: 09/08/2013  [facility] Patient Information 2015 [place], [place]. This information is not intended to replace advice given to you by your health care provider. Make sure you discuss any questions you have with your health care provider.

## 2013-12-26 ENCOUNTER — Ambulatory Visit (INDEPENDENT_AMBULATORY_CARE_PROVIDER_SITE_OTHER): Payer: 59 | Admitting: Surgery

## 2014-01-16 ENCOUNTER — Ambulatory Visit (INDEPENDENT_AMBULATORY_CARE_PROVIDER_SITE_OTHER): Payer: 59 | Admitting: Surgery

## 2014-02-03 ENCOUNTER — Encounter (HOSPITAL_BASED_OUTPATIENT_CLINIC_OR_DEPARTMENT_OTHER): Payer: Self-pay

## 2014-02-03 ENCOUNTER — Ambulatory Visit (HOSPITAL_BASED_OUTPATIENT_CLINIC_OR_DEPARTMENT_OTHER)
Admission: RE | Admit: 2014-02-03 | Discharge: 2014-02-03 | Disposition: A | Discharge status: Home / Self Care | Payer: BLUE CROSS/BLUE SHIELD | Source: Ambulatory Visit | Attending: GASTROENTEROLOGY | Admitting: GASTROENTEROLOGY

## 2014-02-03 DIAGNOSIS — Z1159 Encounter for screening for other viral diseases: Secondary | ICD-10-CM | POA: Insufficient documentation

## 2014-02-03 DIAGNOSIS — Z860100 Personal history of colon polyps, unspecified: Secondary | ICD-10-CM | POA: Insufficient documentation

## 2014-02-03 DIAGNOSIS — Z01812 Encounter for preprocedural laboratory examination: Secondary | ICD-10-CM | POA: Insufficient documentation

## 2014-02-03 HISTORY — DX: Polyp of colon: K63.5

## 2014-02-03 LAB — CBC W/ AUTOMATED DIFF - BMC ONLY
BASOPHIL #: 0.1 10*3/uL (ref 0.0–0.1)
BASOPHILS %: 1.2 % (ref 0.0–2.5)
EOSINOPHIL #: 0 K/uL (ref 0.0–0.5)
EOSINOPHIL %: 0.7 % (ref 0.0–5.2)
HCT: 40.7 % (ref 36.0–45.0)
HGB: 13.9 g/dL (ref 12.0–15.5)
LYMPHOCYTE #: 1.2 K/uL (ref 0.7–3.2)
LYMPHOCYTE #: 1.2 K/uL (ref 0.7–3.2)
LYMPHOCYTE %: 27.6 % (ref 15.0–43.0)
MCH: 30.5 pg (ref 28.3–34.3)
MCHC: 34.1 g/dL (ref 32.0–36.0)
MCV: 89.5 fL (ref 82.0–97.0)
MONOCYTE #: 0.5 K/uL (ref 0.2–0.9)
MONOCYTE %: 12.7 % — ABNORMAL HIGH (ref 4.8–12.0)
MPV: 9 fL (ref 7.4–10.5)
NRBC ABSOLUTE: 0 K/uL (ref 0–0.02)
NRBC: 0 /100{WBCs} (ref 0–0.3)
PLATELET COUNT: 233 10*3/uL (ref 150–450)
PLATELET COUNT: 233 K/uL (ref 150–450)
PMN #: 2.4 K/uL (ref 1.5–6.5)
PMN %: 57.8 % (ref 43.0–76.0)
RBC: 4.55 M/uL (ref 4.00–5.10)
RDW: 13.4 % (ref 10.5–14.5)
WBC: 4.2 K/uL (ref 4.0–11.0)
WBC: 4.2 K/uL (ref 4.0–11.0)

## 2014-02-03 LAB — HCV ANTIBODY SCREEN (IN-HOUSE) - INACTIVE
HCV ANTIBODY: NONREACTIVE
HCV RATIO: 0.08 ratio (ref 0.00–0.99)

## 2014-03-03 ENCOUNTER — Encounter (INDEPENDENT_AMBULATORY_CARE_PROVIDER_SITE_OTHER): Payer: Self-pay | Admitting: GASTROENTEROLOGY

## 2014-03-08 ENCOUNTER — Encounter (HOSPITAL_BASED_OUTPATIENT_CLINIC_OR_DEPARTMENT_OTHER)
Admission: RE | Disposition: A | Discharge status: Home / Self Care | Payer: Self-pay | Source: Ambulatory Visit | Attending: GASTROENTEROLOGY

## 2014-03-08 ENCOUNTER — Encounter (HOSPITAL_BASED_OUTPATIENT_CLINIC_OR_DEPARTMENT_OTHER): Payer: Self-pay

## 2014-03-08 ENCOUNTER — Ambulatory Visit (HOSPITAL_BASED_OUTPATIENT_CLINIC_OR_DEPARTMENT_OTHER)
Admission: RE | Admit: 2014-03-08 | Discharge: 2014-03-08 | Disposition: A | Discharge status: Home / Self Care | Payer: BLUE CROSS/BLUE SHIELD | Source: Ambulatory Visit | Attending: GASTROENTEROLOGY | Admitting: GASTROENTEROLOGY

## 2014-03-08 DIAGNOSIS — Z860100 Personal history of colon polyps, unspecified: Secondary | ICD-10-CM | POA: Insufficient documentation

## 2014-03-08 DIAGNOSIS — E039 Hypothyroidism, unspecified: Secondary | ICD-10-CM | POA: Insufficient documentation

## 2014-03-08 DIAGNOSIS — Z87891 Personal history of nicotine dependence: Secondary | ICD-10-CM | POA: Insufficient documentation

## 2014-03-08 DIAGNOSIS — I7300 Raynaud's syndrome without gangrene: Secondary | ICD-10-CM | POA: Insufficient documentation

## 2014-03-08 DIAGNOSIS — K5730 Diverticulosis of large intestine without perforation or abscess without bleeding: Secondary | ICD-10-CM | POA: Insufficient documentation

## 2014-03-08 DIAGNOSIS — K648 Other hemorrhoids: Secondary | ICD-10-CM | POA: Insufficient documentation

## 2014-03-08 SURGERY — COLONOSCOPY
Anesthesia: IV Sedation | Wound class: Clean Contaminated Wounds-The respiratory, GI, Genital, or urinary

## 2014-03-08 MED ORDER — LACTATED RINGERS INTRAVENOUS SOLUTION
INTRAVENOUS | Status: DC
Start: 2014-03-08 — End: 2014-03-08

## 2014-03-08 SURGICAL SUPPLY — 13 items
CONV USE 306395 - GLOVE SURG 7.5 LTX 3 PLY PF BE_AD CUF SMTH STRL BRN TINT (GLOVES AND ACCESSORIES) ×1
CONV USE ITEM 308902 - GLOVE SURG 7.5 LF PF BEAD CUF_STRL 12IN PROTEXIS PI PLISPRN (GLOVES AND ACCESSORIES) ×1
CONV USE ITEM 321837 - GLOVE SURG 7.5 LTX 3 PLY PF BE_AD CUF SMTH STRL BRN TINT (GLOVES AND ACCESSORIES) ×1 IMPLANT
CONV USE ITEM 321846 - GLOVE SURG 7.5 LF PF BEAD CUF_STRL 12IN PROTEXIS PI PLISPRN (GLOVES AND ACCESSORIES) ×1 IMPLANT
FORCEPS BIOPSY NEEDLE 240CM RJ 4 JMB (SURGICAL INSTRUMENTS) ×1 IMPLANT
FORCEPS BIOPSY NEEDLE 240CM RJ_4 JMB DISP (INSTRUMENTS) ×1
GW ENDOSCOPIC .035IN 260CM DREAMWIRE ANG RX EGLD NITINOL BIL STRL DISP (ENDOSCOPIC SUPPLIES) IMPLANT
INK ENDOSCOPIC MARKER SPOT 5ML GIS44 STERILE 10EA/BX (MISCELLANEOUS PT CARE ITEMS) IMPLANT
SNARE LRG OVL MED STF ENDOS OL_MPS DISP (INSTRUMENTS ENDOMECHANICAL)
SYRINGE LL 30ML LF  STRL CONCEN TIP GRAD N-PYRG DEHP-FR MED DISP (NEEDLES & SYRINGE SUPPLIES) IMPLANT
SYRINGE LL 30ML LF STRL CONCE_N TIP GRAD N-PYRG DEHP-FR MED (NEEDLES & SYRINGE SUPPLIES)
TRAP SPEC RETR 15CM ETRAP PLPC_TM STRL DISP (INSTRUMENTS)
TRAP SPECI REM 15CM ETRAP MAGNIFY WIND MSR GUIDE PLPCTM STRL LF  DISP (SURGICAL INSTRUMENTS) IMPLANT

## 2014-03-08 NOTE — Nurses Notes (Signed)
Patient discharged home with family.  AVS reviewed with patient/care giver.  A written copy of the AVS and discharge instructions was given to the patient/care giver.  Questions sufficiently answered as needed.  Patient/care giver encouraged to follow up with PCP as indicated.  In the event of an emergency, patient/care giver instructed to call 911 or go to the nearest emergency room.

## 2014-03-08 NOTE — Discharge Instructions (Signed)
[facility]  [place], [place] [address]     Anesthesia Discharge Instructions    1.  Do NOT drive an automobile today.    2.  Do NOT sign any legal documents for 24 hours.    3.  Do NOT operate any electrical equipment today.    Fall risk prevention education has been reviewed with the patient/significant other.

## 2014-03-08 NOTE — Consults (Signed)
[facility]  [place], [place]  [address]    ANESTHESIA PRE-OP EVALUATION    MRN:  [id number]  Gina Barrett  58 y.o.  Sex: female     Date of Service: 03/08/2014    Surgeon: Juliann Mule):  Rahimian, Dominic Pea, DO    Scheduled Procedure:  Procedure(s):  COLONOSCOPY WITH BIOPSY  COLONOSCOPY WITH POLYPECTOMY    Diagnosis/Pertinant HPI: History Colonic Polyps     Weight: 50.349 kg (111 lb)  Height: 154.9 cm (5' 1" )     Filed Vitals:    03/08/14 1403   BP: 154/82   Pulse: 106   Temp: 36.5 C (97.7 F)   Resp: 16            ALLERGY:    Allergies   Allergen Reactions    Penicillins        Medications Prior to Admission     Prescriptions    Conj Estrog-Medroxyprogest Ace (PREMPRO) 0.45-1.5 mg Oral Tablet    Take 1 Tab by mouth Once a day   x cycloSPORINE (RESTASIS) 0.05 % Ophthalmic Dropperette    Instill 1 Drop into both eyes Every 12 hours    levothyroxine (SYNTHROID) 88 mcg Oral Tablet    take 88 mcg by mouth Once a day.    loratadine (CLARITIN) 10 mg Oral Tablet    take 10 mg by mouth Once a day.    Nifedipine (ADALAT CC) 30 mg Oral Tablet Sustained Release    take 30 mg by mouth Once a day.    OMEGA-3 FATTY ACIDS (FISH OIL ORAL)    take  by mouth Once a day.    Sodium,Potassium,&Mag Sulfates (SUPREP BOWEL PREP KIT) 17.5-3.13-1.6 gram Oral Recon Soln    USE AS DOCTOR DIRECTED          Current Facility-Administered Medications:  LR premix infusion  Intravenous Continuous       Past Medical History   Diagnosis Date    Raynaud's disease     STD (sexually transmitted disease)      HSV    Abnormal Pap smear      ASCUS, ASCUS cannot rule out HGSIL    Unspecified disorder of thyroid      Hypothyroidism    Colon polyp          Past Surgical History   Procedure Laterality Date    Hx tubal ligation  1978    Pb forearm/wrist surgery unlisted  1999    Hx foot surgery      Hx elbow surgery      Hx colonoscopy  2009    Hx cataract removal Bilateral 2011         Pregnant: no    Prior  Anesthesia Difficulties:  no    Familial Anesthesia Difficulties: no    Social History     Occupational History     [employer]     Social History Main Topics    Smoking status: Former Smoker -- 20 years    Smokeless tobacco: Not on file    Alcohol Use: No    Drug Use: No    Sexual Activity:     Partners: Male     Museum/gallery curator: None     Other Topics Concern    Breast Self Exam Yes    Calcium Intake Adequate No    Ability To Walk 2 Flight Of Steps Without Sob/Cp Yes    Ability To Do Own Adl's Yes  Labs:   BMP:    Lab Results   Component Value Date    SODIUM 142 11/17/2013    POTASSIUM 4.1 11/17/2013    CHLORIDE 107 11/17/2013    CO2 28 11/17/2013    BUN 12 11/17/2013    CREATININE 0.77 11/17/2013    GLUCOSECJ 91 11/17/2013    ANIONGAP 7 11/17/2013    GFR >60 11/17/2013    CALCIUM 8.9 11/17/2013     CBC:    Lab Results   Component Value Date    WBC 4.2 02/03/2014    HGB 13.9 02/03/2014    HCT 40.7 02/03/2014    PLTCNT 233 02/03/2014      Platelets:    Lab Results   Component Value Date    PLTCNT 233 02/03/2014     Coags:  No results found for: PROTHROMTME, INR, INRNORM, APTT, DDIMERQUANT, FIBRINOGEN  TSH:   Lab Results   Component Value Date    TSH3RDGEN 1.846 05/20/2013       ANESTHESIA DAY OF SURGERY EVALUATION      NPO: Meds with sips of water  Airway: II (soft palate, uvula, fauces visible)  Dentition:  Upper and lower  Edentulous    Cardiovascular: regular rate and rhythm   Respiratory: clear to auscultation bilaterally       ASA Status: 2  Proposed Anesthesia: IVGA       Pre-Induction Evaluation and informed consent including risks, benefits, and alternatives. Patient was seen and evaluated immediately prior to the induction of anesthesia.

## 2014-03-08 NOTE — OR Nursing (Signed)
Cecum reached at 1455

## 2014-03-08 NOTE — Consults (Signed)
[place]  ANESTHESIA POSTOP EVALUATION NOTE    03/08/2014    Temperature: 36.5 C (97.7 F) (03/08/14 1403)  Heart Rate: 78 (03/08/14 1516)  BP (Non-Invasive): 119/74 mmHg (03/08/14 1516)  Respiratory Rate: 20 (03/08/14 1516)  SpO2-1: 94 % (03/08/14 1516)    Patient should be sufficiently recovered from the effects of anesthesia to partcipate in the evaluation or has returned to their pre-procedure level.    I have reviewed and evaluated the following:  Respiratory Function:  Consistent with pre anesthetic level  Cardiovascular Function:  Consistent with pre anesthetic level  Mental Status:  Return to pre anesthetic baseline level  Pain:  Sufficiently controlled with medication  Nausea and Vomiting:  Absent or sufficiently controlled with medication    Post operative complications: None  Comment/ re-evaluation for any variations:  None      Rachel Bo, CRNA 03/08/2014, 16:07

## 2014-03-08 NOTE — H&P (Signed)
[facility]                                  [place], [place] [address]                                     [phone]                     SURGICAL HISTORY AND PHYSICAL    PATIENT NAME: Gina Barrett, Gina Barrett  HOSPITAL WGNFAO:[id number]  DATE OF SERVICE:03/08/2014  DATE OF BIRTH: December 23, 1955    IMPRESSION:  History of polyps.    RECOMMENDATIONS:  Proceed with colonoscopy.    HISTORY OF PRESENT ILLNESS:  This is a 58 year old with no complaints, here for surveillance.    PAST MEDICAL HISTORY:  Raynaud's.    PAST SURGICAL HISTORY:  No recent abdominal surgeries.    SOCIAL HISTORY:  Previous smoker.    FAMILY HISTORY:  Noncontributory.    MEDICATIONS:  Per EMR, unchanged.    REVIEW OF SYSTEMS:  Negative for any chest pain or shortness of breath, no nausea or vomiting, no dyspepsia or dysphagia, no melena, hematochezia, no constipation or diarrhea, no unintentional weight loss, no fevers, chills or sweats.    PHYSICAL EXAMINATION:  Vitals are stable.    HEENT:  Unremarkable.    HEART:  Regular.    LUNGS:  Clear.    ABDOMEN:  Good bowel sounds, soft, nontender.  No masses, no organomegaly, no ascites.    EXTREMITIES:  No edema.    SKIN:  There are no rashes.      [place], DO      [id number]; D: 03/08/2014 10:31:04; T: 03/08/2014 10:53:08

## 2014-03-08 NOTE — Brief Op Note (Signed)
[facility]                                              BRIEF OPERATIVE NOTE    Patient Name: Gina Barrett, Rinck Number: [id number]  Encounter number: [id number]  Date of Service: 03/08/2014   Date of Birth: [birth year]      Pre-Operative Diagnosis: History Colonic Polyps     Post-Operative Diagnosis: MILD DIVERTICULAR DISEASE, INTERNAL HEMORRHOIDS    Procedure(s)/Description:  Procedure(s):  COLONOSCOPY    Findings: above     Attending Surgeon: Dominic Pea Rand Boller, DO    Assistant(s):  None    Estimated Blood Loss:  Minimal    Specimens removed  None    Complications:  None           Disposition: Phase II           Condition: stable    Patient is at increased risk for surgical bleeding : No    Patient is on postop antibiotic regimen > 24 hours:  No    Beta Blocker:  N/A

## 2014-03-08 NOTE — OR Surgeon (Signed)
[facility]                                  [place], [place] [address]                                     [phone]                           OPERATIVE SUMMARY    PATIENT NAME: Gina Barrett, Gina Barrett CWUGQB:[id number]  DATE OF SERVICE:03/08/2014  DATE OF BIRTH: [birth year]    PREOPERATIVE DIAGNOSIS:  History of polyps, adenomatous.    POSTOPERATIVE DIAGNOSES:  Minimal diverticular disease, sigmoid colon, internal hemorrhoids, otherwise unremarkable examination.    RECOMMENDATIONS:  High fiber diet, repeat examination in 5 years.  Follow up in the office as needed.  Continue follow up with PCP.    SURGEON:   Jaleen Grupp, DO    NAME OF PROCEDURE:  Colonoscopy.    ANESTHESIA:  Per anesthesiology.    DESCRIPTION OF PROCEDURE:  After informed consent was obtained explaining the risks of the procedure including death, perforation, bleeding, infection, aspiration and missed cancer, the patient understood this and agreed to the procedure.  After adequate sedation was achieved, external perianal area was inspected.  There were no abnormalities.  Digital rectal exam revealed no masses or strictures.  The Olympus colonoscope was inserted into the rectum, negotiated to the cecum without difficulty.  The prep was excellent.  The light reflex, vascular pattern, mucosal pattern and haustral pattern was normal.  On withdrawal of roughly 15 minutes, I noted the following:  The ileocecal valve and appendiceal orifice were normal.  The cecum was normal.  The ascending colon was normal.  The transverse colon was normal.  The descending colon was normal.  In the sigmoid colon, a few small scattered early diverticula.  The rectum was normal on forward view.  Retroflexion revealed some internal hemorrhoids.  No masses or strictures.  The scope was then straightened out, brought out of the patient.  The patient tolerated the procedure well  with no immediate complications.      [place], DO      [id number]; D: 03/08/2014 15:38:40; T: 03/08/2014 20:45:47    cc: [place] [address]      [place], [place] [id number]

## 2014-03-22 ENCOUNTER — Other Ambulatory Visit: Payer: Self-pay

## 2014-04-17 ENCOUNTER — Encounter: Payer: Self-pay | Admitting: Obstetrics & Gynecology

## 2014-05-18 ENCOUNTER — Ambulatory Visit
Admission: RE | Admit: 2014-05-18 | Discharge: 2014-05-18 | Disposition: A | Discharge status: Home / Self Care | Payer: BLUE CROSS/BLUE SHIELD | Source: Ambulatory Visit | Attending: NURSE PRACTITIONER | Admitting: NURSE PRACTITIONER

## 2014-05-18 DIAGNOSIS — I10 Essential (primary) hypertension: Secondary | ICD-10-CM | POA: Insufficient documentation

## 2014-05-18 DIAGNOSIS — E039 Hypothyroidism, unspecified: Secondary | ICD-10-CM | POA: Insufficient documentation

## 2014-05-18 LAB — COMPREHENSIVE METABOLIC PROFILE - BMC/JMC ONLY
ALBUMIN/GLOBULIN RATIO: 1.3
ALBUMIN: 3.9 g/dL (ref 3.2–5.0)
ALKALINE PHOSPHATASE: 48 IU/L (ref 35–120)
ALT (SGPT): 13 IU/L (ref 0–55)
ANION GAP: 6 mmol/L (ref 3–11)
AST (SGOT): 17 IU/L (ref 0–45)
AST (SGOT): 17 IU/L (ref 0–45)
BILIRUBIN, TOTAL: 0.8 mg/dL (ref 0.0–1.3)
BUN: 10 mg/dL (ref 6–22)
CALCIUM: 9.3 mg/dL (ref 8.5–10.5)
CARBON DIOXIDE: 26 mmol/L (ref 22–32)
CHLORIDE: 109 mmol/L (ref 101–111)
ESTIMATED GLOMERULAR FILTRATION RATE: >60 mL/min (ref >60)
GLUCOSE: 93 mg/dL (ref 70–110)
POTASSIUM: 4.1 mmol/L (ref 3.5–5.0)
SODIUM: 141 mmol/L (ref 136–145)
TOTAL PROTEIN: 6.7 g/dL (ref 6.0–8.0)

## 2014-05-18 LAB — TSH,3RD GENERATION: TSH 3RD GENERATION: 1.079 u[IU]/mL (ref 0.340–5.600)

## 2014-05-23 DIAGNOSIS — H1830 Unspecified corneal membrane change: Secondary | ICD-10-CM | POA: Insufficient documentation

## 2014-06-12 ENCOUNTER — Encounter: Payer: Self-pay | Admitting: *Deleted

## 2014-06-13 ENCOUNTER — Encounter: Payer: Self-pay | Admitting: Obstetrics & Gynecology

## 2014-09-15 ENCOUNTER — Encounter (HOSPITAL_BASED_OUTPATIENT_CLINIC_OR_DEPARTMENT_OTHER): Payer: Self-pay

## 2014-09-21 ENCOUNTER — Ambulatory Visit
Admission: RE | Admit: 2014-09-21 | Discharge: 2014-09-21 | Disposition: A | Discharge status: Home / Self Care | Payer: BLUE CROSS/BLUE SHIELD | Source: Ambulatory Visit | Attending: NURSE PRACTITIONER | Admitting: NURSE PRACTITIONER

## 2014-09-21 DIAGNOSIS — F419 Anxiety disorder, unspecified: Secondary | ICD-10-CM | POA: Insufficient documentation

## 2014-09-21 DIAGNOSIS — R5383 Other fatigue: Secondary | ICD-10-CM | POA: Insufficient documentation

## 2014-09-21 DIAGNOSIS — R634 Abnormal weight loss: Secondary | ICD-10-CM | POA: Insufficient documentation

## 2014-09-21 LAB — COMPREHENSIVE METABOLIC PROFILE - BMC/JMC ONLY
ALBUMIN/GLOBULIN RATIO: 1.3
ALBUMIN: 3.9 g/dL (ref 3.2–5.0)
ALKALINE PHOSPHATASE: 47 IU/L (ref 35–120)
ALT (SGPT): 12 IU/L (ref 0–55)
AST (SGOT): 19 IU/L (ref 0–45)
AST (SGOT): 19 IU/L (ref 0–45)
BILIRUBIN, TOTAL: 0.7 mg/dL (ref 0.0–1.3)
BUN: 8 mg/dL (ref 6–22)
BUN: 8 mg/dL (ref 6–22)
CALCIUM: 9.4 mg/dL (ref 8.5–10.5)
CARBON DIOXIDE: 25 mmol/L (ref 22–32)
CHLORIDE: 105 mmol/L (ref 101–111)
CHLORIDE: 105 mmol/L (ref 101–111)
CREATININE: 0.71 mg/dL (ref 0.53–1.00)
ESTIMATED GLOMERULAR FILTRATION RATE: >60 mL/min (ref >60)
POTASSIUM: 3.9 mmol/L (ref 3.5–5.0)
SODIUM: 137 mmol/L (ref 136–145)
TOTAL PROTEIN: 6.8 g/dL (ref 6.0–8.0)

## 2014-09-21 LAB — CBC
BASOPHIL #: 0.02 10*3/uL (ref 0.00–0.10)
EOSINOPHIL #: 0.04 10*3/uL (ref 0.00–0.50)
EOSINOPHIL %: 1.1 % (ref 0.0–5.2)
HCT: 39.4 % (ref 36.0–45.0)
HCT: 39.4 % (ref 36.0–45.0)
HGB: 13.3 g/dL (ref 12.0–15.5)
LYMPHOCYTE #: 1.71 K/uL (ref 0.70–3.20)
LYMPHOCYTE #: 1.71 K/uL (ref 0.70–3.20)
MCH: 30.7 pg (ref 28.0–34.0)
MCHC: 33.8 g/dL (ref 33.0–37.0)
MCV: 91 fL (ref 82–97)
MPV: 9.2 fL (ref 7.0–9.4)
PLATELET COUNT: 233 K/uL (ref 150–400)
PMN #: 1.86 10*3/uL (ref 1.50–6.50)
PMN %: 47.2 % (ref 43.0–76.0)
RBC: 4.33 M/uL (ref 4.00–5.10)
RDW: 13.6 % (ref 10.5–14.5)

## 2014-09-21 LAB — TSH,3RD GENERATION
TSH 3RD GENERATION: 0.367 u[IU]/mL (ref 0.340–5.600)
TSH 3RD GENERATION: 0.367 u[IU]/mL (ref 0.340–5.600)

## 2014-09-26 ENCOUNTER — Ambulatory Visit
Admission: RE | Admit: 2014-09-26 | Discharge: 2014-09-26 | Disposition: A | Discharge status: Home / Self Care | Payer: BLUE CROSS/BLUE SHIELD | Source: Ambulatory Visit | Attending: NURSE PRACTITIONER | Admitting: NURSE PRACTITIONER

## 2014-09-26 ENCOUNTER — Other Ambulatory Visit (HOSPITAL_BASED_OUTPATIENT_CLINIC_OR_DEPARTMENT_OTHER): Payer: Self-pay | Admitting: NURSE PRACTITIONER

## 2014-09-26 DIAGNOSIS — R110 Nausea: Secondary | ICD-10-CM | POA: Insufficient documentation

## 2014-09-26 DIAGNOSIS — R109 Unspecified abdominal pain: Secondary | ICD-10-CM

## 2014-09-26 DIAGNOSIS — R634 Abnormal weight loss: Secondary | ICD-10-CM | POA: Insufficient documentation

## 2014-10-18 ENCOUNTER — Other Ambulatory Visit (HOSPITAL_BASED_OUTPATIENT_CLINIC_OR_DEPARTMENT_OTHER): Payer: Self-pay | Admitting: NURSE PRACTITIONER

## 2014-10-18 DIAGNOSIS — R1031 Right lower quadrant pain: Secondary | ICD-10-CM

## 2014-10-18 DIAGNOSIS — R109 Unspecified abdominal pain: Secondary | ICD-10-CM

## 2014-10-24 ENCOUNTER — Ambulatory Visit (INDEPENDENT_AMBULATORY_CARE_PROVIDER_SITE_OTHER): Payer: BLUE CROSS/BLUE SHIELD | Admitting: Obstetrics & Gynecology

## 2014-10-24 MED ORDER — IOPAMIDOL 370 MG IODINE/ML (76 %) INTRAVENOUS SOLUTION
100.00 mL | INTRAVENOUS | Status: AC
Start: 2014-10-24 — End: 2014-10-25
  Administered 2014-10-25: 17:00:00 70 mL via INTRAVENOUS
  Filled 2014-10-24: qty 100

## 2014-10-25 ENCOUNTER — Ambulatory Visit (HOSPITAL_BASED_OUTPATIENT_CLINIC_OR_DEPARTMENT_OTHER)
Admission: RE | Admit: 2014-10-25 | Discharge: 2014-10-25 | Disposition: A | Discharge status: Home / Self Care | Payer: BLUE CROSS/BLUE SHIELD | Source: Ambulatory Visit | Attending: NURSE PRACTITIONER | Admitting: NURSE PRACTITIONER

## 2014-10-25 DIAGNOSIS — R1031 Right lower quadrant pain: Secondary | ICD-10-CM

## 2014-10-25 DIAGNOSIS — R109 Unspecified abdominal pain: Secondary | ICD-10-CM | POA: Insufficient documentation

## 2014-10-26 ENCOUNTER — Ambulatory Visit (HOSPITAL_BASED_OUTPATIENT_CLINIC_OR_DEPARTMENT_OTHER)
Admission: RE | Admit: 2014-10-26 | Discharge: 2014-10-26 | Disposition: A | Discharge status: Home / Self Care | Payer: Self-pay | Source: Ambulatory Visit | Attending: PATHOLOGY-ANATOMIC PATHOLOGY AND CLINICAL PATHOLOGY | Admitting: PATHOLOGY-ANATOMIC PATHOLOGY AND CLINICAL PATHOLOGY

## 2014-10-26 DIAGNOSIS — Z0000 Encounter for general adult medical examination without abnormal findings: Secondary | ICD-10-CM | POA: Insufficient documentation

## 2014-10-26 LAB — LIPID PANEL
CHOL/HDL RATIO: 3.1
CHOLESTEROL: 222 mg/dL — ABNORMAL HIGH (ref 120–199)
HDL-CHOLESTEROL: 71 mg/dL (ref >39)
LDL (CALCULATED): 142 mg/dL — ABNORMAL HIGH (ref <130)
TRIGLYCERIDES: 44 mg/dL (ref <150)
VLDL (CALCULATED): 9 mg/dL (ref 5–35)

## 2014-10-26 LAB — GLUCOSE - BMC/JMC ONLY
GLUCOSE: 90 mg/dL (ref 70–110)
GLUCOSE: 90 mg/dL (ref 70–110)

## 2014-11-22 ENCOUNTER — Ambulatory Visit
Admission: RE | Admit: 2014-11-22 | Discharge: 2014-11-22 | Disposition: A | Discharge status: Home / Self Care | Payer: BLUE CROSS/BLUE SHIELD | Source: Ambulatory Visit | Attending: NURSE PRACTITIONER | Admitting: NURSE PRACTITIONER

## 2014-11-22 ENCOUNTER — Other Ambulatory Visit (INDEPENDENT_AMBULATORY_CARE_PROVIDER_SITE_OTHER): Payer: Self-pay | Admitting: Obstetrics & Gynecology

## 2014-11-22 DIAGNOSIS — I10 Essential (primary) hypertension: Secondary | ICD-10-CM | POA: Insufficient documentation

## 2014-11-22 DIAGNOSIS — E785 Hyperlipidemia, unspecified: Secondary | ICD-10-CM | POA: Insufficient documentation

## 2014-11-22 LAB — COMPREHENSIVE METABOLIC PROFILE - BMC/JMC ONLY
ALBUMIN/GLOBULIN RATIO: 1.3
ALBUMIN: 3.6 g/dL (ref 3.2–5.0)
ALKALINE PHOSPHATASE: 50 IU/L (ref 35–120)
ALT (SGPT): 12 IU/L (ref 0–55)
ANION GAP: 5 mmol/L (ref 3–11)
AST (SGOT): 17 IU/L (ref 0–45)
BILIRUBIN, TOTAL: 0.4 mg/dL (ref 0.0–1.3)
BUN: 10 mg/dL (ref 6–22)
CALCIUM: 9.3 mg/dL (ref 8.5–10.5)
CARBON DIOXIDE: 27 mmol/L (ref 22–32)
CHLORIDE: 110 mmol/L (ref 101–111)
CREATININE: 0.69 mg/dL (ref 0.53–1.00)
ESTIMATED GLOMERULAR FILTRATION RATE: >60 mL/min (ref >60)
GLUCOSE: 100 mg/dL (ref 70–110)
POTASSIUM: 4.2 mmol/L (ref 3.5–5.0)
SODIUM: 142 mmol/L (ref 136–145)
SODIUM: 142 mmol/L (ref 136–145)
TOTAL PROTEIN: 6.3 g/dL (ref 6.0–8.0)

## 2014-11-22 LAB — LIPID PANEL
CHOL/HDL RATIO: 3.6
CHOLESTEROL: 215 mg/dL — ABNORMAL HIGH (ref 120–199)
HDL-CHOLESTEROL: 60 mg/dL (ref >39)
LDL (CALCULATED): 144 mg/dL — ABNORMAL HIGH (ref <130)
TRIGLYCERIDES: 53 mg/dL (ref <150)
TRIGLYCERIDES: 53 mg/dL (ref <150)
VLDL (CALCULATED): 11 mg/dL (ref 5–35)

## 2014-11-30 IMAGING — US US SOFT TISSUE HEAD/NECK
1 series · 14 of 25 positions shown · non-contrast
Comparison: US SOFT TISSUE HEAD/NECK dated 04/09/2011; US BIOPSY
dated 07/25/2008

CLINICAL DATA: Nodule. Previous FNA biopsy of right dominant lesion
07/25/2008.

EXAM:
THYROID ULTRASOUND
TECHNIQUE: Ultrasound examination of the thyroid gland and adjacent soft
tissues was performed.

[Series 1: us soft tissue head/neck · 0.07mm/px · 14 of 58 slices shown]
[im 1/58]
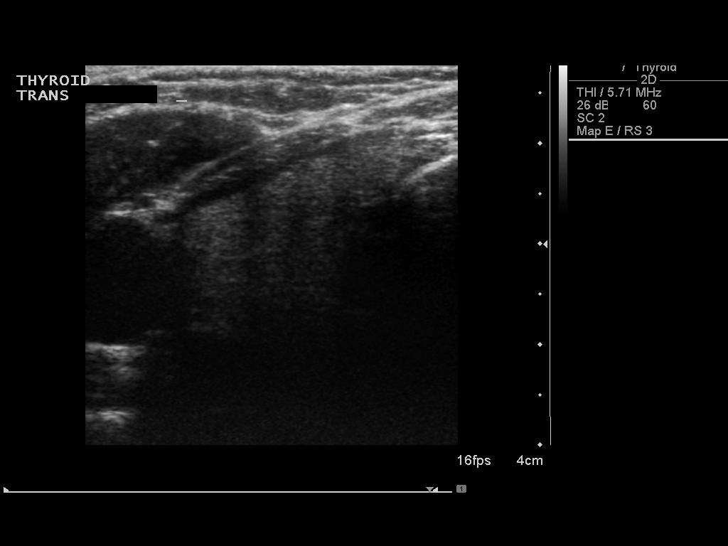
[im 5/58]
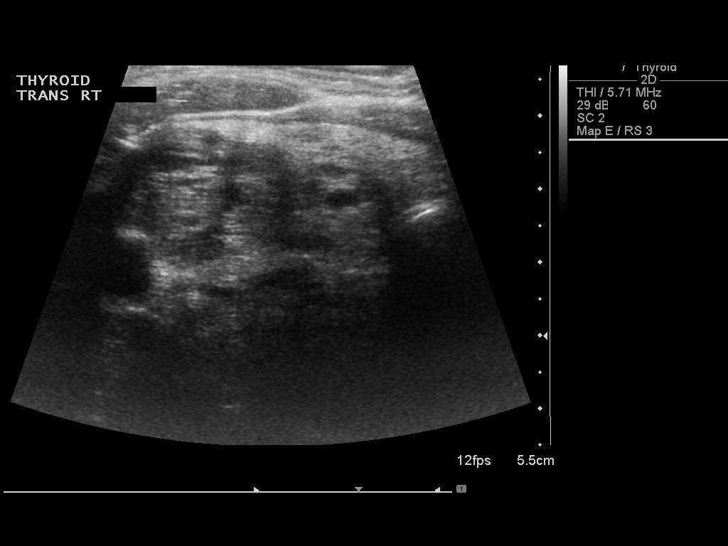
[im 10/58]
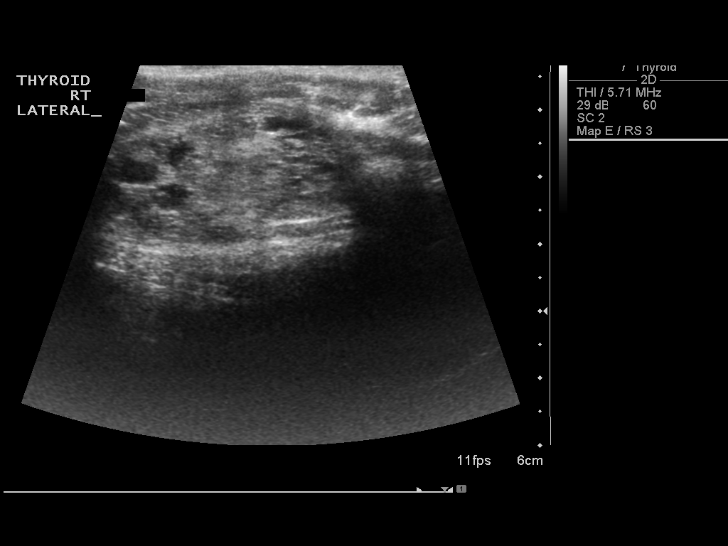
[im 15/58]
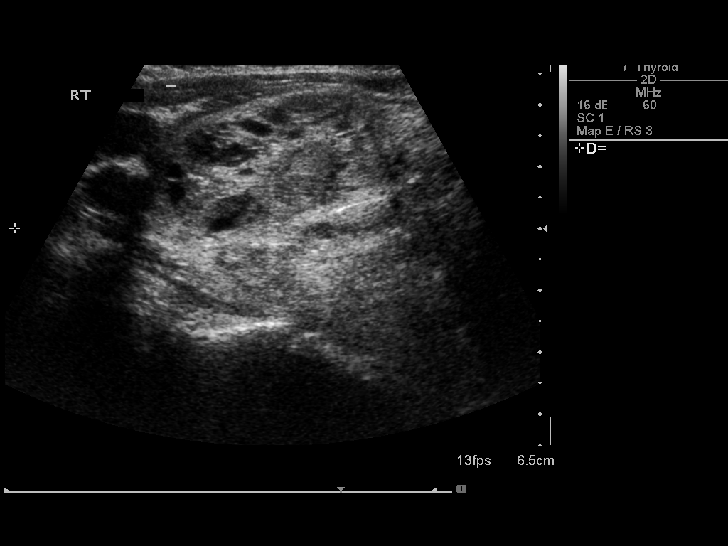
[im 20/58]
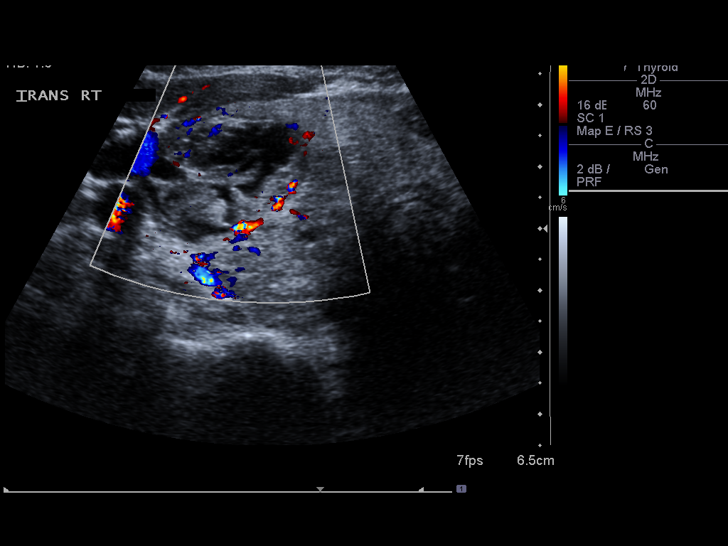
[im 22/58]
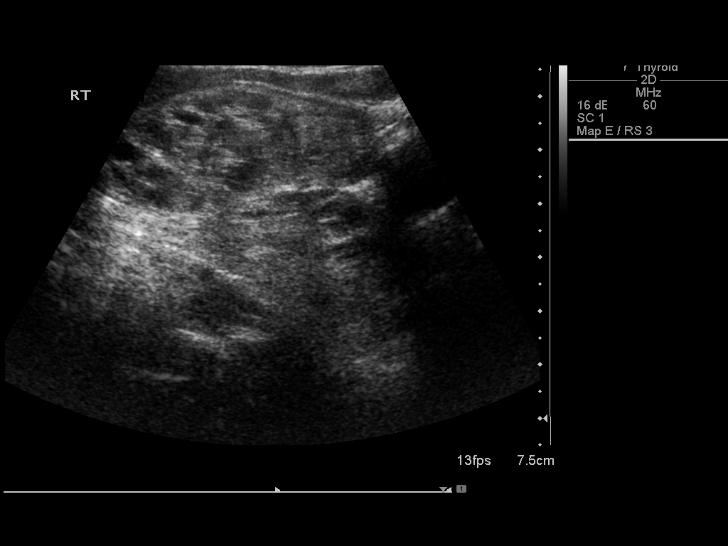
[im 27/58]
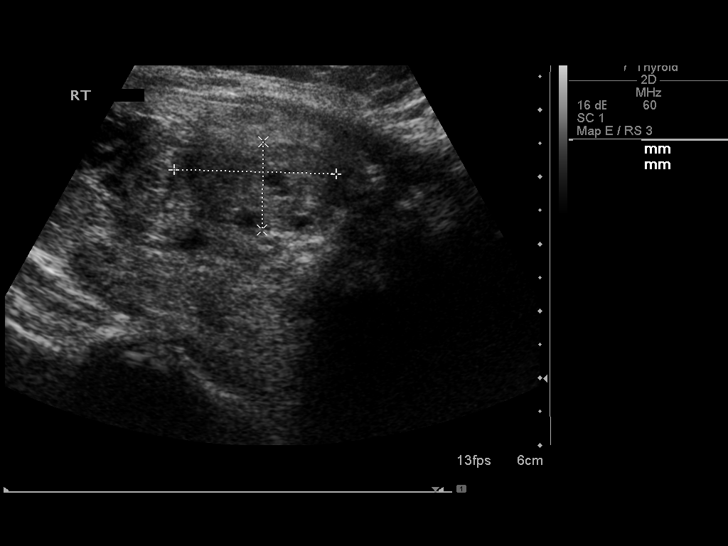
[im 31/58]
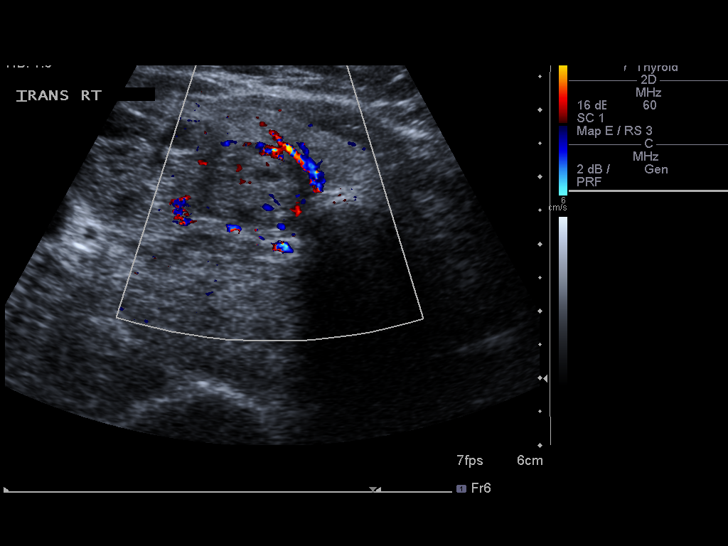
[im 36/58]
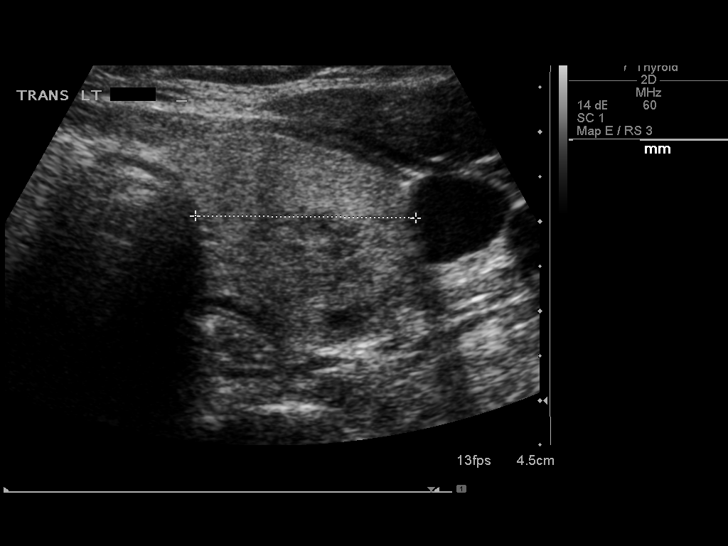
[im 39/58]
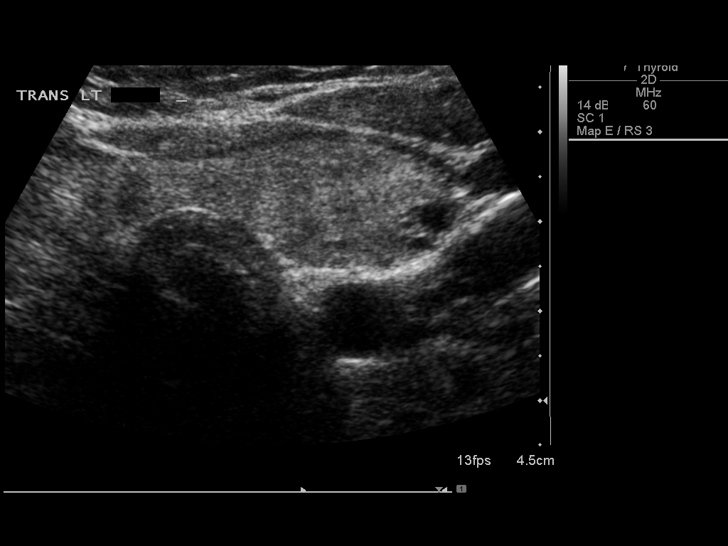
[im 43/58]
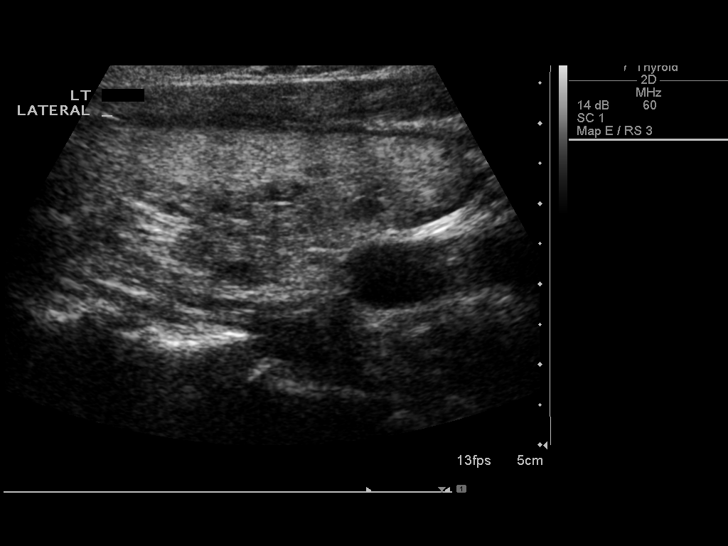
[im 48/58]
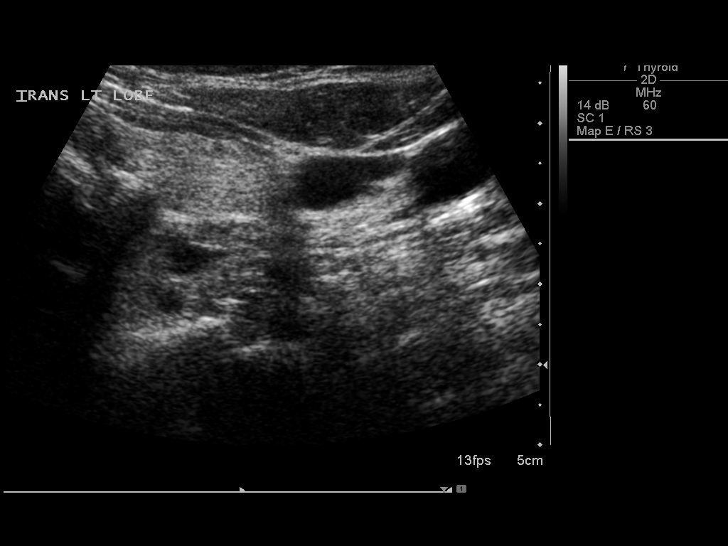
[im 53/58]
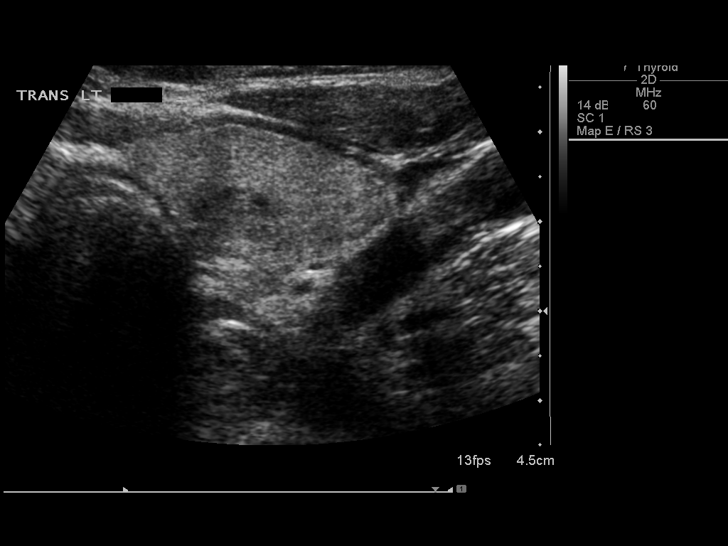
[im 58/58]
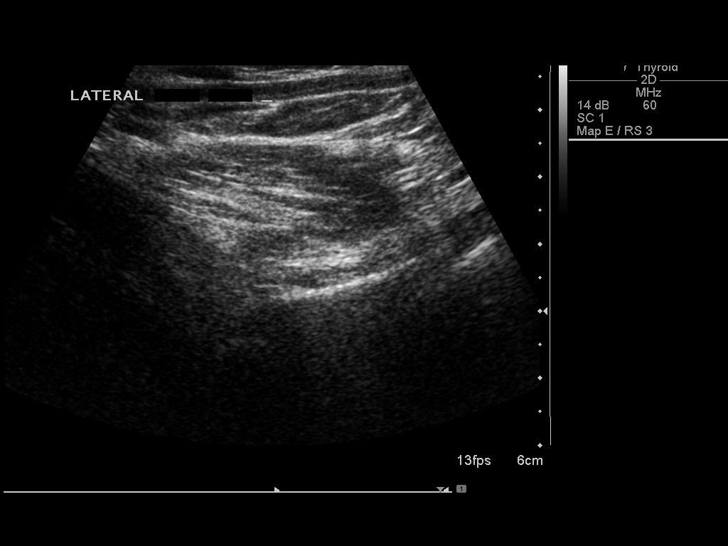

[14 of 25 positions shown; findings below may reference images not displayed]

FINDINGS: Right thyroid lobe

Measurements: 72 x 43 x 42 mm (previously 67 x 42 x 41).. Multiple
nodules:

4 x 2 x 2.5 cm complex mostly solid, superior pole (previously 31 x
18 x 22).

4.6 x 2.2 x 3 cm complex mostly solid, inferior pole deep
(previously 38 x 24 x 27).

Left thyroid lobe

Measurements: 61 x 25 x 25 mm (previously 56 x 26 x 25)..
Heterogeneous echotexture with several nodules:

21 x 11 x 17 mm complex mostly solid, mid lobe (previously 14 x 9 x
11).

19 x 9 x 11 mm complex mostly solid, medial lobe, not previously
described.

Isthmus

Thickness: 4 mm (previously 6)..  No nodules visualized.

Lymphadenopathy

None visualized.
IMPRESSION: 1. Thyromegaly with multiple large nodules. Dominant bilateral
lesions demonstrate interval enlargement since previous study.
Correlate with previous biopsy results.

## 2015-01-04 ENCOUNTER — Other Ambulatory Visit (INDEPENDENT_AMBULATORY_CARE_PROVIDER_SITE_OTHER): Payer: Self-pay | Admitting: Obstetrics & Gynecology

## 2015-01-04 DIAGNOSIS — Z1231 Encounter for screening mammogram for malignant neoplasm of breast: Secondary | ICD-10-CM

## 2015-01-06 ENCOUNTER — Encounter (HOSPITAL_BASED_OUTPATIENT_CLINIC_OR_DEPARTMENT_OTHER): Payer: Self-pay

## 2015-01-06 ENCOUNTER — Ambulatory Visit (HOSPITAL_BASED_OUTPATIENT_CLINIC_OR_DEPARTMENT_OTHER)
Admission: RE | Admit: 2015-01-06 | Discharge: 2015-01-06 | Disposition: A | Discharge status: Home / Self Care | Payer: BLUE CROSS/BLUE SHIELD | Source: Ambulatory Visit | Attending: Obstetrics & Gynecology | Admitting: Obstetrics & Gynecology

## 2015-01-06 DIAGNOSIS — Z1231 Encounter for screening mammogram for malignant neoplasm of breast: Secondary | ICD-10-CM | POA: Insufficient documentation

## 2015-01-10 ENCOUNTER — Ambulatory Visit (INDEPENDENT_AMBULATORY_CARE_PROVIDER_SITE_OTHER): Payer: BLUE CROSS/BLUE SHIELD | Admitting: Obstetrics & Gynecology

## 2015-01-10 ENCOUNTER — Ambulatory Visit (HOSPITAL_BASED_OUTPATIENT_CLINIC_OR_DEPARTMENT_OTHER): Payer: BLUE CROSS/BLUE SHIELD | Attending: Obstetrics & Gynecology

## 2015-01-10 ENCOUNTER — Other Ambulatory Visit (HOSPITAL_COMMUNITY): Payer: Self-pay | Admitting: Obstetrics & Gynecology

## 2015-01-10 VITALS — BP 120/62 | Temp 98.7°F | Ht 61.0 in | Wt 103.2 lb

## 2015-01-10 DIAGNOSIS — Z01419 Encounter for gynecological examination (general) (routine) without abnormal findings: Secondary | ICD-10-CM

## 2015-01-10 DIAGNOSIS — Z0000 Encounter for general adult medical examination without abnormal findings: Secondary | ICD-10-CM

## 2015-01-10 DIAGNOSIS — Z124 Encounter for screening for malignant neoplasm of cervix: Secondary | ICD-10-CM

## 2015-01-10 MED ORDER — CONJ ESTROGEN-MEDROXYPROGESTERONE 0.45 MG-1.5 MG TABLET
1.0000 | ORAL_TABLET | Freq: Every day | ORAL | Status: DC
Start: 2015-01-10 — End: 2016-02-05

## 2015-01-10 NOTE — Progress Notes (Signed)
Gina  [address]  [place], [place]  [address]  [phone]      HPI Comments:    59 y.o. [id number]    Chief Complaint   Patient presents with    Annual Pap   Annual exam   No problems today. Does monthly breast exams  Getting ready to go on vacation  Does regular breast exams     Review of Systems :  Constitutional: Negative.    Skin: Negative.    HENT: Negative.    Cardiovascular: Negative.    Respiratory: Negative.    Gastrointestinal: Negative.    Genitourinary:        See HPI   Musculoskeletal: Negative.    Neurological: Negative.    Psychiatric: Negative.      Patient's PMH/PSH/FH/SH/Meds/Allg were all reviewed and noted in the EPIC chart on today's visit.    Objective:  BP 120/62 mmHg   Temp(Src) 37.1 C (98.7 F) (Oral)   Ht 1.549 m ('5\' 1"'$ )   Wt 46.811 kg (103 lb 3.2 oz)   BMI 19.51 kg/m2    Physical Exam:    Constitutional: She is oriented. She appears well-developed and well-nourished.     HENT: Head: Normocephalic.     Abdomen: She exhibits no distension. Soft. No tenderness. She has no rebound and no guarding.     Neurological: She is alert and oriented.    Psychiatric: She has a normal mood and affect. Her behavior is normal.     Breast exam:   Right normal breast; with no masses, skin changes or nipple discharge. No tenderness to right breast.   Left normal breast; with no masses, skin changes or nipple discharge.  No tenderness to left breast.   GU:  External Exam: External exam normal.    No urethral tenderness.  No genital lesions present.    Vaginal Exam:  Vagina normal to exam.   Bladder:  Bladder is normal to exam. Suprapubic fullness not present.    Uterus: Uterus is normal to exam.  Cervix is normal to exam.  Position: infantile  Contour:  Regular.    Mobility:  Mobile.    Adnexa:  No masses and no tenderness.        Assessment & Plan:     ICD-10-CM    1. Visit for annual health examination Z00.00 CYTOPATHOLOGY-GYN (PAP AND HPV TESTS)     MAMMO SCREENING  BILATERAL W/ TOMO       Return in about 1 year (around 01/10/2016).  prempro   Advise of findings   continue present therapy

## 2015-01-12 LAB — HISTORICAL CYTOPATHOLOGY-GYN (PAP AND HPV TESTS)

## 2015-05-06 ENCOUNTER — Other Ambulatory Visit: Payer: Self-pay

## 2015-08-08 ENCOUNTER — Ambulatory Visit: Payer: BC Managed Care – PPO | Attending: NURSE PRACTITIONER

## 2015-08-08 DIAGNOSIS — E039 Hypothyroidism, unspecified: Secondary | ICD-10-CM | POA: Insufficient documentation

## 2015-08-08 DIAGNOSIS — E785 Hyperlipidemia, unspecified: Secondary | ICD-10-CM | POA: Insufficient documentation

## 2015-08-08 LAB — CBC W/AUTO DIFF
BASOPHIL #: 0.1 x10ˆ3/uL (ref 0.00–0.10)
BASOPHIL %: 1 % (ref 0–3)
EOSINOPHIL #: 0 x10ˆ3/uL (ref 0.00–0.50)
EOSINOPHIL %: 1 % (ref 0–5)
HCT: 40.4 % (ref 36.0–45.0)
HGB: 13.5 g/dL (ref 12.0–15.5)
LYMPHOCYTE #: 1.7 x10ˆ3/uL (ref 1.00–4.80)
LYMPHOCYTE %: 29 % (ref 15–43)
MCH: 29.7 pg (ref 27.5–33.2)
MCHC: 33.5 g/dL (ref 32.0–36.0)
MCV: 88.6 fL (ref 82.0–97.0)
MONOCYTE #: 0.7 x10ˆ3/uL (ref 0.20–0.90)
MONOCYTE %: 13 % — ABNORMAL HIGH (ref 5–12)
MONOCYTE %: 13 % — ABNORMAL HIGH (ref 5–12)
MPV: 9.4 fL (ref 7.4–10.5)
NEUTROPHIL #: 3.3 x10ˆ3/uL (ref 1.50–6.50)
NEUTROPHIL %: 56 % (ref 43–76)
PLATELETS: 225 x10ˆ3/uL (ref 150–450)
RBC: 4.56 x10ˆ6/uL (ref 4.00–5.10)
RDW: 13.3 % (ref 11.0–16.0)
WBC: 5.9 x10ˆ3/uL (ref 4.0–11.0)

## 2015-08-08 LAB — COMPREHENSIVE METABOLIC PROFILE - BMC/JMC ONLY
ALBUMIN/GLOBULIN RATIO: 1 (ref 0.8–2.0)
ALBUMIN: 3.6 g/dL (ref 3.5–5.0)
ALKALINE PHOSPHATASE: 61 U/L (ref 38–126)
ALT (SGPT): 18 U/L (ref 14–54)
ANION GAP: 8 mmol/L (ref 3–11)
AST (SGOT): 22 U/L (ref 15–41)
BILIRUBIN TOTAL: 0.6 mg/dL (ref 0.3–1.2)
BUN/CREA RATIO: 8 (ref 6–22)
BUN: 6 mg/dL (ref 6–20)
CALCIUM: 9.4 mg/dL (ref 8.6–10.3)
CHLORIDE: 105 mmol/L (ref 101–111)
CO2 TOTAL: 26 mmol/L (ref 22–32)
CREATININE: 0.74 mg/dL (ref 0.44–1.00)
ESTIMATED GFR: >60 mL/min/1.73mˆ2 (ref >60)
GLUCOSE: 97 mg/dL (ref 70–110)
POTASSIUM: 4.3 mmol/L (ref 3.4–5.1)
PROTEIN TOTAL: 7.1 g/dL (ref 6.4–8.3)
SODIUM: 139 mmol/L (ref 136–145)

## 2015-08-08 LAB — LIPID PANEL
CHOL/HDL RATIO: 3.7
CHOLESTEROL: 224 mg/dL — ABNORMAL HIGH (ref 120–199)
HDL CHOL: 60 mg/dL (ref 45–65)
LDL CALC: 151 mg/dL — ABNORMAL HIGH (ref <=130)
TRIGLYCERIDES: 65 mg/dL (ref 35–135)
VLDL CALC: 13 mg/dL (ref 5–35)
VLDL CALC: 13 mg/dL (ref 5–35)

## 2015-08-08 LAB — THYROID STIMULATING HORMONE (SENSITIVE TSH): TSH: 0.743 u[IU]/mL (ref 0.340–5.600)

## 2016-01-17 ENCOUNTER — Ambulatory Visit (HOSPITAL_BASED_OUTPATIENT_CLINIC_OR_DEPARTMENT_OTHER)
Admission: RE | Admit: 2016-01-17 | Discharge: 2016-01-17 | Disposition: A | Discharge status: Home / Self Care | Payer: BC Managed Care – PPO | Source: Ambulatory Visit | Attending: Obstetrics & Gynecology | Admitting: Obstetrics & Gynecology

## 2016-01-17 ENCOUNTER — Encounter (HOSPITAL_BASED_OUTPATIENT_CLINIC_OR_DEPARTMENT_OTHER): Payer: Self-pay

## 2016-01-17 DIAGNOSIS — Z1231 Encounter for screening mammogram for malignant neoplasm of breast: Secondary | ICD-10-CM | POA: Insufficient documentation

## 2016-01-17 DIAGNOSIS — Z0000 Encounter for general adult medical examination without abnormal findings: Secondary | ICD-10-CM

## 2016-02-05 ENCOUNTER — Encounter (INDEPENDENT_AMBULATORY_CARE_PROVIDER_SITE_OTHER): Payer: Self-pay | Admitting: Obstetrics & Gynecology

## 2016-02-05 ENCOUNTER — Ambulatory Visit (INDEPENDENT_AMBULATORY_CARE_PROVIDER_SITE_OTHER): Payer: BC Managed Care – PPO | Admitting: Obstetrics & Gynecology

## 2016-02-05 VITALS — BP 122/70 | HR 82 | Temp 97.9°F | Ht 61.0 in | Wt 118.0 lb

## 2016-02-05 DIAGNOSIS — Z01419 Encounter for gynecological examination (general) (routine) without abnormal findings: Principal | ICD-10-CM

## 2016-02-12 NOTE — Progress Notes (Signed)
[id number]  02/05/2016  Gina Barrett    NARJIS MIRA is a 60 y.o. female who presents for annual exam. The patient has no complaints today. The patient is sexually active.  Does do SBE. Has not felt anything of concern.No family history of Gyn or breast cancer.    Wears seatbelts: yes   last pap: was normal  Regular exercise: yes  Ever been transfused or tattooed?: no  The patient reports that domestic violence in her life is absent.   Vaccination. FLU vaccine Will check with PCP  Calcium intake. Takes three serving of diary  STD/PID no history  PCB. No   Dyspaunia No  Depression screen negative  TSH,DM and lipid per PCP    Past Medical History:   Diagnosis Date    Abnormal Pap smear     ASCUS, ASCUS cannot rule out HGSIL    Colon polyp     Raynaud's disease     STD (sexually transmitted disease)     HSV    Unspecified disorder of thyroid     Hypothyroidism     Past Surgical History:   Procedure Laterality Date    COLONOSCOPY N/A 03/08/2014    Performed by Duayne Cal, DO at [facility] ([place])    HX CATARACT REMOVAL Bilateral 2011    HX COLONOSCOPY  2009    HX ELBOW SURGERY      HX FOOT SURGERY      HX TUBAL LIGATION  1978    PB FOREARM/WRIST SURGERY UNLISTED  1999    carpal tunnel release       Outpatient Medications Prior to Visit:  Conj Estrog-Medroxyprogest Ace (PREMPRO) 0.45-1.5 mg Oral Tablet Take 1 Tab by mouth Once a day   cycloSPORINE (RESTASIS) 0.05 % Ophthalmic Dropperette Instill 1 Drop into both eyes Every 12 hours   levothyroxine (SYNTHROID) 88 mcg Oral Tablet take 88 mcg by mouth Once a day.   loratadine (CLARITIN) 10 mg Oral Tablet take 10 mg by mouth Once a day.   Nifedipine (ADALAT CC) 30 mg Oral Tablet Sustained Release take 30 mg by mouth Once a day.   OMEGA-3 FATTY ACIDS (FISH OIL ORAL) take  by mouth Once a day.   Conj Estrog-Medroxyprogest Ace 0.45-1.5 mg Oral Tablet Take 1 Tab by mouth Once a day (Patient not taking: Reported on 02/05/2016)      Sodium,Potassium,&Mag Sulfates (SUPREP BOWEL PREP KIT) 17.5-3.13-1.6 gram Oral Recon Soln USE AS DOCTOR DIRECTED (Patient not taking: Reported on 01/10/2015)     No facility-administered medications prior to visit.   Review of Systems - negative to review except HPI  Family History   Problem Relation Age of Onset    No Known Problems Father     Breast Cancer Mother     Diabetes Paternal Grandfather     Diabetes Brother     No Known Problems Sister     No Known Problems Maternal Grandmother     No Known Problems Maternal Grandfather     No Known Problems Paternal Grandmother     No Known Problems Daughter     No Known Problems Son     No Known Problems Maternal Aunt     No Known Problems Maternal Uncle     No Known Problems Paternal Aunt     No Known Problems Paternal Uncle     No Known Problems Other     Colon Cancer Neg Hx     Rectal  Cancer Neg Hx     Uterine Cancer Neg Hx     Heart Disease Neg Hx     Hypertension Neg Hx     Cervical Cancer Neg Hx     Liver Cancer Neg Hx     Lung Cancer Neg Hx     Lymphoma Neg Hx     Melanoma Neg Hx     Ovarian Cancer Neg Hx     Pancreatic Cancer Neg Hx     Thyroid Cancer Neg Hx     Cancer Neg Hx     Prostate Cancer Neg Hx     Leukemia Neg Hx      Social History     Social History    Marital status: Married     Spouse name: N/A    Number of children: N/A    Years of education: [employer]     Social History Main Topics    Smoking status: Former Smoker     Years: 20.00    Smokeless tobacco: Not on file    Alcohol use No    Drug use: No    Sexual activity: Yes     Partners: Male     Patent examiner protection: None     Other Topics Concern    Breast Self Exam Yes    Calcium Intake Adequate No    Ability To Walk 2 Flight Of Steps Without Sob/Cp Yes    Ability To Do Own Adl's Yes     Social History Narrative       Objective:     Vitals:    02/05/16 1415   BP: 122/70   Pulse: 82   Temp: 36.6 C (97.9 F)    TempSrc: Oral   Weight: 53.5 kg (118 lb)   Height: 1.549 m (_0 )     General:  Alert, cooperative, no distress, appears stated age.   Back:   Symmetric, no curvature. ROM normal. No CVA tenderness.   Lungs:   Clear to auscultation bilaterally.   Heart:  Regular rate and rhythm, S1, S2 normal, no murmur, click, rub or gallop.   Breast Exam:  No tenderness,or nipple abnormality. No adenopathy.no masses. No skin changes   Abdomen:   Soft, non-tender. Bowel sounds normal. No masses,  No organomegaly.   Genitalia:  Normal female without lesion, discharge or tenderness.    no prolapse or stress incontinence, no cervical lesions, . Uterus mobile, non tender normal size, anteverted. No masses noted.   Extremities: Extremities normal, atraumatic, no cyanosis or edema.     Assessment:     Healthy female exam.    Plan:   Annual Female well visit.    Diagnosis explained in detail, including differential.  All questions answered.  Neurosurgeon distributed.  Discussed healthy lifestyle modifications  Plan: Health maintenance topics   a. Osteoporosis prevention: discussed vit D/calcium,  b. Obesity Nutrition, exercise and weight management:   Regular exercise: discussed   Healthy diet: discussed   Weight management: discussed   c. Tobacco cessation: n/a   d. Cancer screening:   Breast cancer screening: performs SBE   Mammograms: done this month normal. Repeat order for next year given.,  Cervical cancer screening: PAP last year normal with negative HPV 2014.  Colorectal cancer screening: per PCP  e. Lab testing: cholesterol, DM screening and TSH per PCP

## 2016-03-07 ENCOUNTER — Ambulatory Visit (HOSPITAL_BASED_OUTPATIENT_CLINIC_OR_DEPARTMENT_OTHER): Payer: BC Managed Care – PPO | Attending: NURSE PRACTITIONER

## 2016-03-07 DIAGNOSIS — I10 Essential (primary) hypertension: Secondary | ICD-10-CM | POA: Insufficient documentation

## 2016-03-07 DIAGNOSIS — E785 Hyperlipidemia, unspecified: Principal | ICD-10-CM | POA: Insufficient documentation

## 2016-03-07 LAB — LIPID PANEL
CHOL/HDL RATIO: 4
CHOLESTEROL: 230 mg/dL — ABNORMAL HIGH (ref 120–199)
HDL CHOL: 57 mg/dL (ref 45–65)
LDL CALC: 161 mg/dL — ABNORMAL HIGH (ref <=130)
LDL CALC: 161 mg/dL — ABNORMAL HIGH (ref <=130)
TRIGLYCERIDES: 60 mg/dL (ref 35–135)
VLDL CALC: 12 mg/dL (ref 5–35)

## 2016-03-07 LAB — COMPREHENSIVE METABOLIC PROFILE - BMC/JMC ONLY
ALBUMIN/GLOBULIN RATIO: 1 (ref 0.8–2.0)
ALBUMIN: 3.5 g/dL (ref 3.5–5.0)
ALKALINE PHOSPHATASE: 53 U/L (ref 38–126)
ALT (SGPT): 12 U/L — ABNORMAL LOW (ref 14–54)
ANION GAP: 7 mmol/L (ref 3–11)
AST (SGOT): 19 U/L (ref 15–41)
BILIRUBIN TOTAL: 0.4 mg/dL (ref 0.3–1.2)
BUN/CREA RATIO: 13 (ref 6–22)
BUN: 10 mg/dL (ref 6–20)
BUN: 10 mg/dL (ref 6–20)
CALCIUM: 9.3 mg/dL (ref 8.8–10.2)
CHLORIDE: 106 mmol/L (ref 101–111)
CHLORIDE: 106 mmol/L (ref 101–111)
CO2 TOTAL: 27 mmol/L (ref 22–32)
CREATININE: 0.77 mg/dL (ref 0.44–1.00)
ESTIMATED GFR: >60 mL/min/1.73mˆ2 (ref >60)
GLUCOSE: 89 mg/dL (ref 70–110)
POTASSIUM: 4.1 mmol/L (ref 3.4–5.1)
PROTEIN TOTAL: 7 g/dL (ref 6.4–8.3)
SODIUM: 140 mmol/L (ref 136–145)

## 2016-03-21 ENCOUNTER — Other Ambulatory Visit (HOSPITAL_BASED_OUTPATIENT_CLINIC_OR_DEPARTMENT_OTHER): Payer: Self-pay | Admitting: NURSE PRACTITIONER

## 2016-03-21 DIAGNOSIS — Z122 Encounter for screening for malignant neoplasm of respiratory organs: Secondary | ICD-10-CM

## 2016-05-10 ENCOUNTER — Ambulatory Visit (HOSPITAL_BASED_OUTPATIENT_CLINIC_OR_DEPARTMENT_OTHER)
Admission: RE | Admit: 2016-05-10 | Discharge: 2016-05-10 | Disposition: A | Discharge status: Home / Self Care | Payer: Self-pay | Source: Ambulatory Visit | Attending: NURSE PRACTITIONER | Admitting: NURSE PRACTITIONER

## 2016-05-10 ENCOUNTER — Other Ambulatory Visit: Payer: Self-pay

## 2016-05-10 DIAGNOSIS — Z122 Encounter for screening for malignant neoplasm of respiratory organs: Secondary | ICD-10-CM | POA: Insufficient documentation

## 2016-05-14 ENCOUNTER — Other Ambulatory Visit: Payer: Self-pay | Admitting: Nurse Practitioner

## 2016-05-14 DIAGNOSIS — E041 Nontoxic single thyroid nodule: Secondary | ICD-10-CM

## 2016-07-22 NOTE — Progress Notes (Signed)
The patient's chart was accessed and reviewed in support of reporting LDCT screening outcomes and recommendations.

## 2016-08-05 ENCOUNTER — Ambulatory Visit (INDEPENDENT_AMBULATORY_CARE_PROVIDER_SITE_OTHER): Payer: BC Managed Care – PPO | Admitting: Obstetrics & Gynecology

## 2016-09-05 ENCOUNTER — Other Ambulatory Visit (HOSPITAL_BASED_OUTPATIENT_CLINIC_OR_DEPARTMENT_OTHER): Payer: Self-pay | Admitting: PULMONARY DISEASE

## 2016-09-05 DIAGNOSIS — R911 Solitary pulmonary nodule: Secondary | ICD-10-CM

## 2016-09-06 ENCOUNTER — Ambulatory Visit (HOSPITAL_BASED_OUTPATIENT_CLINIC_OR_DEPARTMENT_OTHER)
Admission: RE | Admit: 2016-09-06 | Discharge: 2016-09-06 | Disposition: A | Discharge status: Home / Self Care | Payer: BC Managed Care – PPO | Source: Ambulatory Visit | Attending: PULMONARY DISEASE | Admitting: PULMONARY DISEASE

## 2016-09-06 DIAGNOSIS — R911 Solitary pulmonary nodule: Secondary | ICD-10-CM | POA: Insufficient documentation

## 2016-09-09 ENCOUNTER — Ambulatory Visit (INDEPENDENT_AMBULATORY_CARE_PROVIDER_SITE_OTHER): Payer: BC Managed Care – PPO | Admitting: Obstetrics & Gynecology

## 2016-09-09 VITALS — BP 110/72 | HR 90 | Ht 61.0 in | Wt 121.0 lb

## 2016-09-09 DIAGNOSIS — Z6822 Body mass index (BMI) 22.0-22.9, adult: Secondary | ICD-10-CM

## 2016-09-09 DIAGNOSIS — Z79890 Hormone replacement therapy (postmenopausal): Secondary | ICD-10-CM

## 2016-09-09 DIAGNOSIS — N842 Polyp of vagina: Secondary | ICD-10-CM

## 2016-09-09 MED ORDER — CONJ ESTROGEN-MEDROXYPROGESTERONE 0.3 MG-1.5 MG TABLET
1.0000 | ORAL_TABLET | Freq: Every day | ORAL | 11 refills | Status: DC
Start: 2016-09-09 — End: 2018-02-11

## 2016-09-11 ENCOUNTER — Ambulatory Visit (HOSPITAL_BASED_OUTPATIENT_CLINIC_OR_DEPARTMENT_OTHER): Payer: BC Managed Care – PPO | Attending: NURSE PRACTITIONER

## 2016-09-11 DIAGNOSIS — J9562 Intraoperative hemorrhage and hematoma of a respiratory system organ or structure complicating other procedure: Secondary | ICD-10-CM | POA: Insufficient documentation

## 2016-09-11 DIAGNOSIS — Z79899 Other long term (current) drug therapy: Secondary | ICD-10-CM | POA: Insufficient documentation

## 2016-09-11 DIAGNOSIS — I519 Heart disease, unspecified: Secondary | ICD-10-CM

## 2016-09-11 DIAGNOSIS — R911 Solitary pulmonary nodule: Secondary | ICD-10-CM | POA: Insufficient documentation

## 2016-09-11 DIAGNOSIS — I43 Cardiomyopathy in diseases classified elsewhere: Secondary | ICD-10-CM | POA: Insufficient documentation

## 2016-09-11 DIAGNOSIS — E039 Hypothyroidism, unspecified: Secondary | ICD-10-CM | POA: Insufficient documentation

## 2016-09-11 LAB — CBC WITH DIFF
BASOPHIL #: 0.1 x10ˆ3/uL (ref 0.00–0.10)
BASOPHIL %: 1 % (ref 0–3)
EOSINOPHIL #: 0 x10ˆ3/uL (ref 0.00–0.50)
EOSINOPHIL %: 1 % (ref 0–5)
HCT: 37.6 % (ref 36.0–45.0)
HGB: 12.9 g/dL (ref 12.0–15.5)
LYMPHOCYTE #: 1.5 10*3/uL (ref 1.00–4.80)
LYMPHOCYTE #: 1.5 x10ˆ3/uL (ref 1.00–4.80)
LYMPHOCYTE %: 33 % (ref 15–43)
MCH: 29.9 pg (ref 27.5–33.2)
MCHC: 34.3 g/dL (ref 32.0–36.0)
MCV: 87 fL (ref 82.0–97.0)
MONOCYTE #: 0.5 x10ˆ3/uL (ref 0.20–0.90)
MONOCYTE %: 12 % (ref 5–12)
MPV: 9.6 fL (ref 7.4–10.5)
NEUTROPHIL #: 2.5 10*3/uL (ref 1.50–6.50)
NEUTROPHIL #: 2.5 x10ˆ3/uL (ref 1.50–6.50)
NEUTROPHIL %: 54 % (ref 43–76)
PLATELETS: 268 x10ˆ3/uL (ref 150–450)
RBC: 4.32 x10ˆ6/uL (ref 4.00–5.10)
RDW: 13.3 % (ref 11.0–16.0)
WBC: 4.7 x10ˆ3/uL (ref 4.0–11.0)

## 2016-09-11 LAB — PTT (PARTIAL THROMBOPLASTIN TIME)
APTT: 28.3 s (ref 25.1–36.5)
APTT: 28.3 seconds (ref 25.1–36.5)

## 2016-09-11 LAB — PT/INR
INR: 1.11
PROTHROMBIN TIME: 12.2 s (ref 9.4–12.5)

## 2016-09-11 LAB — THYROID STIMULATING HORMONE (SENSITIVE TSH): TSH: 1.15 u[IU]/mL (ref 0.340–5.330)

## 2016-09-11 NOTE — Progress Notes (Signed)
[id number]  09/09/2016  Gina Barrett    Subjective    Patient well known to me. See prior notes for details. Last visit seen for annual. Was noted to have a abnormal vaginal exam notable for 2-3 irregular shaped 1 mm anterior was polypoid lesion. Here for a 3 month follow up to reassess the area. Also atient is on prempro and had been on this for a while. Has no concerns with vaginal dryness or any vasomotor symptoms.no conerns for VTE    Reviewed PMH,Pshx, Meds, Fam HX as well as Social Hx.  No interval change  ROS: negative to review other then HPI    Objective:  Vitals:    09/09/16 0936   BP: 110/72   Pulse: 90   Weight: 54.9 kg (121 lb)   Height: 1.549 m ('5\' 1"'$ )     Body mass index is 22.86 kg/(m^2).    General: alert, cooperative, no distress, appears stated age  Lymph Nodes; Cervical, supraclavicular, and axillary nodes normal.  Thyroid: not palpable  Chest: CTA/P  CVS: S1+S2. No MGR  Abdomen: Soft,non tender, no masses.  Pelvis:Normal VBUS other than atrophy, no prolapse, no incontinence, presence of the 3 vaginal polypoid lesion in anterior was.. unchanges in size or consistency since last exam    Assessment/plan  1. Postmenopausal HRT (hormone replacement therapy)  Discussed should consider to wean this . In agreement. Will decrease dose to 0.'3mg'$ . Follow up in 4-6 months. Will wean further.  - Conj Estrog-Medroxyprogest Ace 0.3-1.5 mg Oral Tablet; Take 1 Tab by mouth Once a day  Dispense: 28 Tab; Refill: 11    2. Polypoid lesions vagina noted on vaginal exam.  Appear benign and no change in 3 months. Follow up in 6 months.

## 2016-09-12 LAB — THYROXINE, TOTAL T4: T4 TOTAL: 9.8 ug/dL (ref 4.0–11.0)

## 2016-10-02 HISTORY — PX: LUNG BIOPSY: SHX232

## 2016-11-13 HISTORY — PX: MEDIASTINOSCOPY: SUR861

## 2016-12-08 ENCOUNTER — Encounter (HOSPITAL_BASED_OUTPATIENT_CLINIC_OR_DEPARTMENT_OTHER): Payer: Self-pay | Admitting: Hematology & Oncology

## 2016-12-08 ENCOUNTER — Encounter (HOSPITAL_BASED_OUTPATIENT_CLINIC_OR_DEPARTMENT_OTHER): Payer: Self-pay | Admitting: Radiology

## 2016-12-08 ENCOUNTER — Ambulatory Visit
Admission: RE | Admit: 2016-12-08 | Discharge: 2016-12-08 | Disposition: A | Discharge status: Home / Self Care | Payer: BC Managed Care – PPO | Source: Ambulatory Visit | Attending: Hematology & Oncology | Admitting: Hematology & Oncology

## 2016-12-08 ENCOUNTER — Inpatient Hospital Stay (HOSPITAL_BASED_OUTPATIENT_CLINIC_OR_DEPARTMENT_OTHER): Payer: BC Managed Care – PPO | Admitting: Hematology & Oncology

## 2016-12-08 ENCOUNTER — Telehealth (HOSPITAL_BASED_OUTPATIENT_CLINIC_OR_DEPARTMENT_OTHER): Payer: Self-pay | Admitting: Hematology & Oncology

## 2016-12-08 VITALS — BP 135/76 | HR 102 | Temp 98.6°F | Resp 28 | Ht 60.75 in | Wt 120.2 lb

## 2016-12-08 DIAGNOSIS — C3490 Malignant neoplasm of unspecified part of unspecified bronchus or lung: Secondary | ICD-10-CM

## 2016-12-08 DIAGNOSIS — Z6822 Body mass index (BMI) 22.0-22.9, adult: Secondary | ICD-10-CM

## 2016-12-08 DIAGNOSIS — Z79899 Other long term (current) drug therapy: Secondary | ICD-10-CM | POA: Insufficient documentation

## 2016-12-08 DIAGNOSIS — Z5111 Encounter for antineoplastic chemotherapy: Secondary | ICD-10-CM | POA: Insufficient documentation

## 2016-12-08 DIAGNOSIS — M3501 Sicca syndrome with keratoconjunctivitis: Secondary | ICD-10-CM

## 2016-12-08 DIAGNOSIS — Z86010 Personal history of colonic polyps: Secondary | ICD-10-CM | POA: Insufficient documentation

## 2016-12-08 DIAGNOSIS — Z803 Family history of malignant neoplasm of breast: Secondary | ICD-10-CM | POA: Insufficient documentation

## 2016-12-08 DIAGNOSIS — Z79890 Hormone replacement therapy (postmenopausal): Secondary | ICD-10-CM | POA: Insufficient documentation

## 2016-12-08 DIAGNOSIS — Z87891 Personal history of nicotine dependence: Secondary | ICD-10-CM | POA: Insufficient documentation

## 2016-12-08 DIAGNOSIS — Z880 Allergy status to penicillin: Secondary | ICD-10-CM | POA: Insufficient documentation

## 2016-12-08 DIAGNOSIS — E039 Hypothyroidism, unspecified: Secondary | ICD-10-CM | POA: Insufficient documentation

## 2016-12-08 DIAGNOSIS — I7300 Raynaud's syndrome without gangrene: Secondary | ICD-10-CM | POA: Insufficient documentation

## 2016-12-08 LAB — CBC W/AUTO DIFF
BASOPHIL #: 0.02 10*3/uL (ref 0.00–0.10)
BASOPHIL %: 1 % (ref 0–3)
EOSINOPHIL #: 0.04 10*3/uL (ref 0.00–0.50)
EOSINOPHIL %: 1 % (ref 0–5)
HCT: 39.3 % (ref 36.0–45.0)
HGB: 13.1 g/dL (ref 12.0–15.5)
LYMPHOCYTE #: 1.72 10*3/uL (ref 1.00–4.80)
LYMPHOCYTE %: 40 % (ref 15–43)
MCH: 28.6 pg (ref 27.5–33.2)
MCHC: 33.2 g/dL (ref 32.0–36.0)
MCV: 86.1 fL (ref 82.0–97.0)
MONOCYTE #: 0.56 10*3/uL (ref 0.20–0.90)
MONOCYTE %: 13 % — ABNORMAL HIGH (ref 5–12)
MPV: 8.1 fL (ref 7.4–10.5)
NEUTROPHIL #: 1.93 10*3/uL (ref 1.50–6.50)
NEUTROPHIL %: 45 % (ref 43–76)
PLATELETS: 301 10*3/uL (ref 150–450)
RBC: 4.56 10*6/uL (ref 4.00–5.10)
RDW: 13.7 % (ref 11.0–16.0)
WBC: 4.3 10*3/uL (ref 4.0–11.0)

## 2016-12-08 LAB — COMPREHENSIVE METABOLIC PROFILE - BMC/JMC ONLY
ALBUMIN/GLOBULIN RATIO: 1 (ref 0.8–2.0)
ALBUMIN: 3.6 g/dL (ref 3.5–5.0)
ALKALINE PHOSPHATASE: 65 U/L (ref 38–126)
ALT (SGPT): 10 U/L — ABNORMAL LOW (ref 14–54)
ANION GAP: 7 mmol/L (ref 3–11)
AST (SGOT): 18 U/L (ref 15–41)
BILIRUBIN TOTAL: 0.2 mg/dL — ABNORMAL LOW (ref 0.3–1.2)
BUN/CREA RATIO: 13 (ref 6–22)
BUN: 10 mg/dL (ref 6–20)
CALCIUM: 9.4 mg/dL (ref 8.8–10.2)
CHLORIDE: 105 mmol/L (ref 101–111)
CO2 TOTAL: 27 mmol/L (ref 22–32)
CREATININE: 0.78 mg/dL (ref 0.44–1.00)
ESTIMATED GFR: >60 mL/min/{1.73_m2} (ref >60)
GLUCOSE: 95 mg/dL (ref 70–110)
POTASSIUM: 4 mmol/L (ref 3.4–5.1)
PROTEIN TOTAL: 7.3 g/dL (ref 6.4–8.3)
SODIUM: 139 mmol/L (ref 136–145)

## 2016-12-08 LAB — PTT (PARTIAL THROMBOPLASTIN TIME): APTT: 27.4 s (ref 25.1–36.5)

## 2016-12-08 NOTE — Nursing Note (Signed)
IR PORT PLACEMENT NO AUTH RQ.    Forwarded to IR.

## 2016-12-08 NOTE — Telephone Encounter (Signed)
NP for consultation. For Dr Holley Dexter. nonsmall cell lung cancer.REF: Dr. Elpidio Galea. appt 12/08/16 @ 1:00pm. The patient was downstairs in Dr. Imelda Pillow office , he will let the pt know of this appt. Gina Barrett

## 2016-12-08 NOTE — Progress Notes (Signed)
Note, a tour of the Cancer & Infusion center was completed. The process for completing lab work (usually to be completed 24 hours in advance when ordered by the Oncologist), obtaining weight and height, medication review, and the infusion are were also discussed.

## 2016-12-08 NOTE — Progress Notes (Signed)
Patient's chart accessed and reviewed. An order for port placement has been entered by the patient's Oncologist. Patient to be seen on 12/11/2016 at 3:30 pm in order to complete chemotherapy teaching. Per Oncologist. The patioent will receive Taxol and Carboplatin along with concurrent radiation therapy.

## 2016-12-09 ENCOUNTER — Ambulatory Visit (INDEPENDENT_AMBULATORY_CARE_PROVIDER_SITE_OTHER): Payer: BC Managed Care – PPO | Admitting: Obstetrics & Gynecology

## 2016-12-09 ENCOUNTER — Encounter (INDEPENDENT_AMBULATORY_CARE_PROVIDER_SITE_OTHER): Payer: Self-pay | Admitting: Obstetrics & Gynecology

## 2016-12-09 VITALS — BP 112/66 | HR 68 | Ht 61.0 in | Wt 122.0 lb

## 2016-12-09 DIAGNOSIS — Z6823 Body mass index (BMI) 23.0-23.9, adult: Secondary | ICD-10-CM

## 2016-12-09 DIAGNOSIS — Z79890 Hormone replacement therapy (postmenopausal): Principal | ICD-10-CM

## 2016-12-09 DIAGNOSIS — N898 Other specified noninflammatory disorders of vagina: Secondary | ICD-10-CM

## 2016-12-09 NOTE — Progress Notes (Signed)
Follow up contact attempted with the patient to address questions and concerns related to her port placement appointment on 12/12/2016 at 8:30 am. A voicemail message was left requesting a return phone contact.

## 2016-12-09 NOTE — Progress Notes (Signed)
[id number]  12/09/2016  Gina Barrett    Subjective    Patient well known to me. See prior notes for details. Last visit started on HRT. Here for 6 month follow up. Also was noted to have a abnormal vaginal exam notable for 2-3 irregular shaped 1 mm anterior was polypoid lesion. Here for a 6 month follow up to reassess the area. Patient  is on prempro and had been on this for a while. Has no concerns with vaginal dryness or any vasomotor symptoms.No conerns for VTE. Recent diagnosis of cancer. PET noted as below.    COMPARISON: CT chest 05/10/2016    FINDINGS:     *  HEAD and NECK: No abnormal tracer uptake.    *  THORAX: There is a 10 mm nodule right upper lobe that is mildly FDG  positive with maximum SUV of 1.5. This is concerning for malignancy. There  is a separate area of tracer uptake in the subpleural aspect left lower  lobe this may represent a localized focal inflammatory response. Maximum  SUV is 4.    *  ABDOMEN: No abnormal tracer uptake.    *  PELVIS: No abnormal tracer uptake.    *  MUSCULOSKELETAL: No abnormal tracer uptake.    Reviewed PMH,Pshx, Meds, Fam HX as well as Social Hx.  No interval change  ROS: negative to review other then HPI    Objective:      Vitals:    09/09/16 0936   BP: 110/72   Pulse: 90   Weight: 54.9 kg (121 lb)   Height: 1.549 m (5\' 1" )     Body mass index is 22.86 kg/(m^2).    General: alert, cooperative, no distress, appears stated age  Lymph Nodes; Cervical, supraclavicular, and axillary nodes normal.  Thyroid: not palpable  Chest: CTA/P  CVS: S1+S2. No MGR  Abdomen: Soft,non tender, no masses.  Pelvis:Normal VBUS other than atrophy, no prolapse, no incontinence, presence of the 3 vaginal polypoid lesion in anterior wall. unchanges in size or consistency since last exam    Assessment/plan  1. Postmenopausal HRT (hormone replacement therapy)  Discussed should consider to wean this . In agreement. Will decrease dose to 0.3mg  every other day now. Will follow up in  4-6 months. Will wean further.  - Conj Estrog-Medroxyprogest Ace 0.3-1.5 mg Oral Tablet; Take 1 Tab by mouth Once a day  Dispense: 28 Tab; Refill: 11    2. Polypoid lesions vagina noted on vaginal exam.  Appear benign and no change in 3 months. Follow up in 6 months.    3. Recent diagnosis of  cancer.  PET reviewed. Pelvis free of any uptake. Discussed the same with the patient and reassuranace that the lesion on the anterior wall of the vagina have not grown and also no uptake therefore unlikely malignant.

## 2016-12-10 ENCOUNTER — Telehealth: Payer: Self-pay

## 2016-12-10 NOTE — Telephone Encounter (Signed)
Pt called and stated that she had a concern after her consultation with you on Monday. She would like for you to call her at [phone]  Thank you!

## 2016-12-12 ENCOUNTER — Inpatient Hospital Stay (HOSPITAL_BASED_OUTPATIENT_CLINIC_OR_DEPARTMENT_OTHER)
Admission: RE | Admit: 2016-12-12 | Discharge: 2016-12-12 | Disposition: A | Discharge status: Home / Self Care | Payer: BC Managed Care – PPO | Source: Ambulatory Visit | Attending: Hematology & Oncology | Admitting: Hematology & Oncology

## 2016-12-12 DIAGNOSIS — C3490 Malignant neoplasm of unspecified part of unspecified bronchus or lung: Secondary | ICD-10-CM | POA: Insufficient documentation

## 2016-12-12 HISTORY — PX: PORTACATH PLACEMENT: SHX2246

## 2016-12-12 MED ORDER — FENTANYL (PF) 50 MCG/ML INJECTION SOLUTION
100.0000 ug | INTRAMUSCULAR | Status: AC
Start: 2016-12-12 — End: 2016-12-12
  Administered 2016-12-12: 100 ug via INTRAVENOUS
  Filled 2016-12-12: qty 2

## 2016-12-12 MED ORDER — LIDOCAINE (PF) 10 MG/ML (1 %) INJECTION SOLUTION
20.00 mL | INTRAMUSCULAR | Status: AC
Start: 2016-12-12 — End: 2016-12-12
  Administered 2016-12-12: 10 mL via INTRADERMAL
  Filled 2016-12-12: qty 20

## 2016-12-12 MED ORDER — HEPARIN (PORCINE) 1,000 UNIT/ML INJECTION - EAST
1000.0000 [IU] | INTRAMUSCULAR | Status: AC
Start: 2016-12-12 — End: 2016-12-12
  Administered 2016-12-12: 1000 [IU]
  Filled 2016-12-12: qty 1

## 2016-12-12 MED ORDER — LIDOCAINE 1 %-EPINEPHRINE 1:100,000 INJECTION SOLUTION
15.00 mL | INTRAMUSCULAR | Status: AC
Start: 2016-12-12 — End: 2016-12-12
  Administered 2016-12-12: 15 mL via INTRADERMAL
  Filled 2016-12-12: qty 20

## 2016-12-12 MED ADMIN — fentaNYL 25 mcg/hr transdermal patch: INTRADERMAL | @ 11:00:00

## 2016-12-12 NOTE — Nurses Notes (Signed)
Patient arrived to IR room 1 for port placement for treatment of.Allergies and medications reviewed. Patient accompanied by. Patient's first chemotherapy treatment is (need to know whether or not to schedule dressing change). Chemo to start on monday  0905 Dr Marcell Anger in room to speak to patient. Procedure discussed with patient/significant other including procedural medications with potential side effects, expected length of time and recovery process, application of dressing. Explained potential complications to report i.e. Bleeding and signs of infection (fever, redness, wound drainage, increased pain). Detailed discharge instructions reviewed and signed. Time allowed to answer any questions.  0920 Patient to IR lab, see scanned in xper for full procedure details.  1025 Patient returned to IR room.  1050 Patient provided AVS summary and port placement card. Patient discharged to main entrance with all belongings and daughter at side. Marland Kitchen

## 2016-12-13 DIAGNOSIS — M3500 Sicca syndrome, unspecified: Secondary | ICD-10-CM | POA: Insufficient documentation

## 2016-12-13 NOTE — H&P (Signed)
[facility] Patient History and Physical      Date: 12/08/2016  Name: Gina Barrett  MRN: [id number]  Referring Physician: No ref. provider found  Primary Care Provider: Lanier Ensign    Reason for visit/consultation  evaluation and treatment of new lung cancer diagnosis    Hem/Onc Diagnosis:   Non-small cell lung cancer diagnosed October 02, 2016 with intial evaluation and diagnosis made at [facility] in [place], [place].    Stage:  IIIb  Molecular studies:  We currently do not have ALK1, ROS1, EGFR or BRAF studies    Treatment: Planned concurrent chemoRT with weekly carbotaxel    History of Present Illness:  Patient reports history of incidental 4 mm right upper lobe nodule discovered in May 2016 with recommendation to monitor nodule per standard criteria.  She has a 25 pack-year smoker.  She did have a CT scan done on May 10, 2016 with result on available to me at this time.   On 06 September 2016, patient had is PET-CT scan that demonstrated a 10 mm right upper lobe nodule concerning for malignancy.  Additionally, there was a 3 cm lung area of FDG uptake the left lower lung deep to the pleura concerning for infection.    On October 02, 2016, she had a CT-guided biopsy which showed a moderately differentiated adenocarcinoma with IHC pr positive for TTF 1, napsin A, CK7 and negative for CK5/6 and P 40.Marland Kitchen  She then underwent a mediastinoscopy on 13 Nov 2016 which found a left mid paratracheal lymph node (area of tumor involvment measures 79m).  A brain MRI on 25 November 2016 was negative for metastatic disease but did show a nonspecific area of elevated T2 signal of unclear etiology as well as area of chronic microvascular ischemic changes.    She has recovered well from above procedures and has no new complaint.  She has sufficient social support and is in clinic with her daughter.  Full review of system as below    Past Medical History  Past Medical History:   Diagnosis Date   . Abnormal Pap smear      ASCUS, ASCUS cannot rule out HGSIL   . Colon polyp    . Raynaud's disease    . STD (sexually transmitted disease)     HSV   . Unspecified disorder of thyroid     Hypothyroidism         Past Surgical History:   Procedure Laterality Date   . HX CATARACT REMOVAL Bilateral 2011   . HX COLONOSCOPY  2009   . HX ELBOW SURGERY     . HX FOOT SURGERY     . HX TUBAL LIGATION  1978   . PB FOREARM/WRIST SURGERY UNLISTED  1999    carpal tunnel release         Allergies   Allergen Reactions   . Penicillins      Current Outpatient Prescriptions   Medication Sig   . Conj Estrog-Medroxyprogest Ace 0.3-1.5 mg Oral Tablet Take 1 Tab by mouth Once a day   . cycloSPORINE (RESTASIS) 0.05 % Ophthalmic Dropperette Instill 1 Drop into both eyes Every 12 hours   . levothyroxine (SYNTHROID) 88 mcg Oral Tablet take 88 mcg by mouth Once a day.   . loratadine (CLARITIN) 10 mg Oral Tablet take 10 mg by mouth Once a day.   . Nifedipine (ADALAT CC) 30 mg Oral Tablet Sustained Release take 30 mg by mouth Once a day.   .Marland Kitchen  OMEGA-3 FATTY ACIDS (FISH OIL ORAL) take  by mouth Once a day.       Family History  Family Medical History     Problem Relation (Age of Onset)    Breast Cancer Mother    Diabetes Paternal Grandfather, Brother    No Known Problems Father, Sister, Maternal Grandmother, Maternal Grandfather, Paternal 67, Daughter, Son, Maternal Aunt, Maternal Uncle, Paternal 2, Paternal Uncle, Other            Social History  Occupation:   Social History     Occupational History   .  [employer]    Hometown: [place] [address]   reports that she quit smoking about 18 years ago. Her smoking use included Cigarettes. She started smoking about 46 years ago. She has a 20.00 pack-year smoking history. She has never used smokeless tobacco. She reports that she currently engages in sexual activity and has had female partners. She reports using the following method of birth control/protection: None. She reports that she does not drink  alcohol or use illicit drugs.    Review of Sytems:   Constitutional: Denies fevers, chills, night sweats. No recent weight changes or fatigue.   Mouth/Throat: Denies any oral ulcers or other lesions. No sore throat, hoarseness or dysphagia.  Cardiovascular: Denies any chest pain, palpitations, PND or DOE. No claudication   Respiratory: Denies any shortness of breath, wheezing, cough or hemoptysis.  GI: Denies any abdominal pain, nausea, vomiting, diarrhea or constipation. No melena or hematochezia.  GU: Denies any dysuria, frequency, hematuria, hesitancy. Denies nocturia    Musculoskeletal: Denies any new arthritic pain or LE edema.    Neurological: No history of syncope or seizures. Denies any dizziness or headaches.  Skin: Denies any recent rashes or lesions. No history of skin cancers.  Endocrine: No heat/cold intolerance.   Hematologic/Lymphatic: No history of bleeding disorders. Denies easy bruising.    Physical Examination:  Most Recent Vitals       Office Visit from 12/08/2016 in [facility] (98.6 F) filed at... 12/08/2016 1307    Heart Rate (!)  102 filed at... 12/08/2016 1307    Respiratory Rate (!)  28 filed at... 12/08/2016 1307    BP (Non-Invasive) 135/76 filed at... 12/08/2016 1307    Height 1.543 m (5' 0.75") filed at... 12/08/2016 1307    Weight 54.5 kg (120 lb 3.2 oz) [current/taken] filed at... 12/08/2016   1307    BMI (Calculated) 22.95 filed at... 12/08/2016 1307    BSA (Calculated) 1.53 filed at... 12/08/2016 1307      ECOG Status:  1  General: Appears well, NAD  HENT:Mouth mucous membranes moist. , Pharynx without injection or exudate. , No oral lesions.   Lymphatics:  No palpable superficial LN  Lungs: clear to auscultation bilaterally.   Cardiovascular: RRR without murmur.  Abdomen: soft, non-tender, non-distended, no hepatosplenomegaly and no masses  Extremities: No edema  Skin: No clinically significant rashes   Neurologic: grossly normal  Lymphatics: No palpable superficial lymph nodes appreciated  Psychiatric: AOx3.  Composed, affable with appropraite affect     Labs  Labs reviewed per pathology reports above  Results for Gina Barrett, Gina Barrett (MRN [id number]) as of 12/13/2016 22:13   Ref. Range 12/08/2016 14:55   WBC Latest Ref Range: 4.0 - 11.0 x10^3/uL 4.3   HGB Latest Ref Range: 12.0 - 15.5 g/dL 13.1   HCT Latest Ref  Range: 36.0 - 45.0 % 39.3   PLATELET COUNT Latest Ref Range: 150 - 450 x10^3/uL 301   RBC Latest Ref Range: 4.00 - 5.10 x10^6/uL 4.56   MCV Latest Ref Range: 82.0 - 97.0 fL 86.1   MCHC Latest Ref Range: 32.0 - 36.0 g/dL 33.2   MCH Latest Ref Range: 27.5 - 33.2 pg 28.6   RDW Latest Ref Range: 11.0 - 16.0 % 13.7   MPV Latest Ref Range: 7.4 - 10.5 fL 8.1   PMN'S Latest Ref Range: 43 - 76 % 45   LYMPHOCYTES Latest Ref Range: 15 - 43 % 40   EOSINOPHIL Latest Ref Range: 0 - 5 % 1   MONOCYTES Latest Ref Range: 5 - 12 % 13 (H)   BASOPHILS Latest Ref Range: 0 - 3 % 1   PMN ABS Latest Ref Range: 1.50 - 6.50 x10^3/uL 1.93   LYMPHS ABS Latest Ref Range: 1.00 - 4.80 x10^3/uL 1.72   EOS ABS Latest Ref Range: 0.00 - 0.50 x10^3/uL 0.04   MONOS ABS Latest Ref Range: 0.20 - 0.90 x10^3/uL 0.56   BASOS ABS Latest Ref Range: 0.00 - 0.10 x10^3/uL 0.02   aPTT Latest Ref Range: 25.1 - 36.5 seconds 27.4   SODIUM Latest Ref Range: 136 - 145 mmol/L 139   POTASSIUM Latest Ref Range: 3.4 - 5.1 mmol/L 4.0   CHLORIDE Latest Ref Range: 101 - 111 mmol/L 105   CARBON DIOXIDE Latest Ref Range: 22 - 32 mmol/L 27   BUN Latest Ref Range: 6 - 20 mg/dL 10   CREATININE Latest Ref Range: 0.44 - 1.00 mg/dL 0.78   GLUCOSE Latest Ref Range: 70 - 110 mg/dL 95   ANION GAP Latest Ref Range: 3 - 11 mmol/L 7   BUN/CREAT RATIO Latest Ref Range: 6 - 22  13   ESTIMATED GLOMERULAR FILTRATION RATE Latest Ref Range: >60 mL/min/1.61m2 >60   CALCIUM Latest Ref Range: 8.8 - 10.2 mg/dL 9.4   TOTAL PROTEIN Latest Ref Range: 6.4 - 8.3 g/dL 7.3   ALBUMIN Latest Ref Range:  3.5 - 5.0 g/dL 3.6   BILIRUBIN, TOTAL Latest Ref Range: 0.3 - 1.2 mg/dL 0.2 (L)   AST (SGOT) Latest Ref Range: 15 - 41 U/L 18   ALT (SGPT) Latest Ref Range: 14 - 54 U/L 10 (L)   ALKALINE PHOSPHATASE Latest Ref Range: 38 - 126 U/L 65   ALBUMIN/GLOBULIN RATIO Latest Ref Range: 0.8 - 2.0  1.0       Colo/EGD      Assessment:   1. Malignant neoplasm of lung, unspecified laterality, unspecified part of lung (CMS HCC)    2. 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 MUseryis a 61year old woman with smoking history and new diagnosis of stage IIIB non-small cell adenocarcinoma.  We discussed the treatment paradigm for her stage and histology of lung cancer.  We specifically discussed concurrent chemotherapy and radiation with or without additional consolidation chemotherapy or immunotherapy.  Several options for her chemotherapy portion of this plan exist per NCCN guideline to include weekly carboplatin and Taxol, carboplatin or cisplatin with pemetrexete.  With none of this chemotherapy is significantly superior to the other, I will proceed with carboplatin and paclitaxel.  Maintenance therapy will be discussed at the conclusion of her treatment if she has no progression and might include chemotherapy or durvulumab which has shown improved progression-free survival.    We will plan to begin her  therapy on 2nd July 2018.  MediPort will be placed on 12 December 2016.  She will be consented on return to clinic.  All questions were answered  Return in about 1 week (around 12/15/2016).  The patient understands that I will not be available to manage her chemotherapy given my part-time position.  She did meet with Dr. Barbaraann Rondo who will guide her treatment.  She has meet with Dr. Elpidio Galea in radiation oncology.      Shihab States O. Holley Dexter, MD, FACP    Portions of this note may be dictated using voice recognition software or a dictation service. Variances in spelling and vocabulary are possible and  unintentional. Not all errors are caught/corrected. Please notify the [facility] if any discrepancies are noted or if the meaning of any statement is not clear.

## 2016-12-14 DIAGNOSIS — C3490 Malignant neoplasm of unspecified part of unspecified bronchus or lung: Secondary | ICD-10-CM | POA: Insufficient documentation

## 2016-12-16 ENCOUNTER — Encounter (HOSPITAL_BASED_OUTPATIENT_CLINIC_OR_DEPARTMENT_OTHER): Payer: Self-pay | Admitting: Hematology & Oncology

## 2016-12-16 ENCOUNTER — Ambulatory Visit (HOSPITAL_BASED_OUTPATIENT_CLINIC_OR_DEPARTMENT_OTHER)
Admission: RE | Admit: 2016-12-16 | Discharge: 2016-12-16 | Disposition: A | Discharge status: Home / Self Care | Payer: BC Managed Care – PPO | Source: Ambulatory Visit | Attending: Hematology & Oncology | Admitting: Hematology & Oncology

## 2016-12-16 DIAGNOSIS — R590 Localized enlarged lymph nodes: Secondary | ICD-10-CM | POA: Insufficient documentation

## 2016-12-16 DIAGNOSIS — Z452 Encounter for adjustment and management of vascular access device: Secondary | ICD-10-CM | POA: Insufficient documentation

## 2016-12-16 DIAGNOSIS — C3490 Malignant neoplasm of unspecified part of unspecified bronchus or lung: Secondary | ICD-10-CM | POA: Insufficient documentation

## 2016-12-16 MED ORDER — IOPAMIDOL 370 MG IODINE/ML (76 %) INTRAVENOUS SOLUTION
100.00 mL | INTRAVENOUS | Status: AC
Start: 2016-12-16 — End: 2016-12-16
  Administered 2016-12-16: 16:00:00 65 mL via INTRAVENOUS
  Filled 2016-12-16: qty 100

## 2016-12-16 MED ADMIN — oxyCODONE 5 mg tablet: INTRAVENOUS | @ 16:00:00

## 2016-12-16 NOTE — Progress Notes (Signed)
Received a contact from Mrs. Gina Barrett reporting the following: (1) pain/discomfort along the insertion area on her neck for her port to just above the port with pain from the anterior of her neck, along her back, ending the the lower back - the pain is dull in nature by report, and (2) she is having a low dull pain with inhalation. Her port was placed on 12/12/2016. Status update provided to the covering Oncologist. Outcome:  A STAT CT Angio of the chest w/wo iv contrast was ordered.

## 2016-12-16 NOTE — Nursing Note (Signed)
STAT CT ANGIO CHEST WO/W IV CONTRAST APPROVED [place] [id number] VALID 12-16-08-1-18.  Pt notified-to go today at 3:45pm.

## 2016-12-18 ENCOUNTER — Other Ambulatory Visit (HOSPITAL_BASED_OUTPATIENT_CLINIC_OR_DEPARTMENT_OTHER): Payer: Self-pay | Admitting: Diagnostic Radiology

## 2016-12-18 ENCOUNTER — Inpatient Hospital Stay (HOSPITAL_BASED_OUTPATIENT_CLINIC_OR_DEPARTMENT_OTHER)
Admission: RE | Admit: 2016-12-18 | Discharge: 2016-12-18 | Disposition: A | Discharge status: Home / Self Care | Payer: BC Managed Care – PPO | Source: Ambulatory Visit | Attending: Diagnostic Radiology | Admitting: Diagnostic Radiology

## 2016-12-18 DIAGNOSIS — C3490 Malignant neoplasm of unspecified part of unspecified bronchus or lung: Secondary | ICD-10-CM

## 2016-12-18 DIAGNOSIS — Z452 Encounter for adjustment and management of vascular access device: Secondary | ICD-10-CM | POA: Insufficient documentation

## 2016-12-18 NOTE — Nurses Notes (Signed)
1015 Patient to IR room 2 for dressing change post left chest port placement. Old dressing removed. No signs of infection noted. Steri strips intact. New dressing placed to left chest. Access site on left neck left open to air and steri strips intact. Patient left unit in no distress with all personal belongings. Patient starts chemotherapy on Monday.

## 2016-12-19 ENCOUNTER — Encounter (HOSPITAL_BASED_OUTPATIENT_CLINIC_OR_DEPARTMENT_OTHER): Payer: Self-pay | Admitting: Hematology & Oncology

## 2016-12-19 MED ORDER — DEXAMETHASONE 4 MG TABLET: Tab | ORAL | 3 refills | 0 days | Status: DC

## 2016-12-19 NOTE — Nursing Note (Signed)
Received new order for chemo from Dr Barbaraann Rondo. Called [employer] @ [phone] spoke with Sylvie Farrier who confirmed authorization is required for J9045 (carboplatin) [phone] (taxol) which I have submitted and is currently pending. Pending auth # [id number]. Clinical faxed to [phone].

## 2016-12-22 ENCOUNTER — Inpatient Hospital Stay
Admission: RE | Admit: 2016-12-22 | Discharge: 2016-12-22 | Disposition: A | Discharge status: Still In Hospital | Payer: BC Managed Care – PPO | Source: Ambulatory Visit | Attending: Hematology & Oncology | Admitting: Hematology & Oncology

## 2016-12-22 VITALS — BP 141/70 | HR 97 | Temp 98.4°F | Resp 24 | Ht 60.75 in | Wt 121.4 lb

## 2016-12-22 DIAGNOSIS — C3490 Malignant neoplasm of unspecified part of unspecified bronchus or lung: Secondary | ICD-10-CM

## 2016-12-22 DIAGNOSIS — Z5111 Encounter for antineoplastic chemotherapy: Secondary | ICD-10-CM

## 2016-12-22 LAB — COMPREHENSIVE METABOLIC PROFILE - BMC/JMC ONLY
ALBUMIN/GLOBULIN RATIO: 1 (ref 0.8–2.0)
ALBUMIN: 3.6 g/dL (ref 3.5–5.0)
ALKALINE PHOSPHATASE: 64 U/L (ref 38–126)
ALT (SGPT): 16 U/L (ref 14–54)
ANION GAP: 7 mmol/L (ref 3–11)
AST (SGOT): 20 U/L (ref 15–41)
BILIRUBIN TOTAL: 0.3 mg/dL (ref 0.3–1.2)
BUN/CREA RATIO: 16 (ref 6–22)
BUN: 11 mg/dL (ref 6–20)
CALCIUM: 9.4 mg/dL (ref 8.8–10.2)
CHLORIDE: 104 mmol/L (ref 101–111)
CO2 TOTAL: 25 mmol/L (ref 22–32)
CO2 TOTAL: 25 mmol/L (ref 22–32)
CREATININE: 0.68 mg/dL (ref 0.44–1.00)
ESTIMATED GFR: >60 mL/min/1.73mˆ2 (ref >60)
GLUCOSE: 146 mg/dL — ABNORMAL HIGH (ref 70–110)
POTASSIUM: 4.2 mmol/L (ref 3.4–5.1)
PROTEIN TOTAL: 7.1 g/dL (ref 6.4–8.3)
SODIUM: 136 mmol/L (ref 136–145)

## 2016-12-22 LAB — CBC W/AUTO DIFF
BASOPHIL #: 0.01 x10ˆ3/uL (ref 0.00–0.10)
BASOPHIL %: 0 % (ref 0–3)
EOSINOPHIL #: 0.05 x10ˆ3/uL (ref 0.00–0.50)
EOSINOPHIL %: 1 % (ref 0–5)
HCT: 37.8 % (ref 36.0–45.0)
HGB: 12.7 g/dL (ref 12.0–15.5)
LYMPHOCYTE #: 0.78 x10ˆ3/uL — ABNORMAL LOW (ref 1.00–4.80)
LYMPHOCYTE %: 17 % (ref 15–43)
MCH: 29 pg (ref 27.5–33.2)
MCHC: 33.7 g/dL (ref 32.0–36.0)
MCV: 86.2 fL (ref 82.0–97.0)
MONOCYTE #: 0.06 x10ˆ3/uL — ABNORMAL LOW (ref 0.20–0.90)
MONOCYTE %: 1 % — ABNORMAL LOW (ref 5–12)
MPV: 8 fL (ref 7.4–10.5)
NEUTROPHIL #: 3.67 10*3/uL (ref 1.50–6.50)
NEUTROPHIL #: 3.67 x10ˆ3/uL (ref 1.50–6.50)
NEUTROPHIL %: 80 % — ABNORMAL HIGH (ref 43–76)
PLATELETS: 305 x10ˆ3/uL (ref 150–450)
RBC: 4.39 x10ˆ6/uL (ref 4.00–5.10)
RDW: 14.2 % (ref 11.0–16.0)
WBC: 4.6 x10ˆ3/uL (ref 4.0–11.0)

## 2016-12-22 LAB — MAGNESIUM: MAGNESIUM: 2 mg/dL (ref 1.4–2.1)

## 2016-12-22 MED ORDER — SODIUM CHLORIDE 0.9 % INTRAVENOUS SOLUTION
INTRAVENOUS | Status: DC
Start: 2016-12-22 — End: 2016-12-23

## 2016-12-22 MED ORDER — ONDANSETRON HCL 2 MG/ML INTRAVENOUS SOLUTION
Freq: Once | INTRAVENOUS | Status: AC
Start: 2016-12-22 — End: 2016-12-22
  Filled 2016-12-22: qty 4

## 2016-12-22 MED ORDER — ONDANSETRON 4 MG DISINTEGRATING TABLET: 4 mg | Tab | Freq: Three times a day (TID) | ORAL | 3 refills | 0 days | Status: DC | PRN

## 2016-12-22 MED ORDER — SODIUM CHLORIDE 0.9 % (FLUSH) INJECTION SYRINGE
10.0000 mL | INJECTION | Freq: Once | INTRAMUSCULAR | Status: AC
Start: 2016-12-22 — End: 2016-12-22
  Administered 2016-12-22: 10 mL

## 2016-12-22 MED ORDER — SODIUM CHLORIDE 0.9 % INTRAVENOUS SOLUTION
195.4000 mg | Freq: Once | INTRAVENOUS | Status: AC
Start: 2016-12-22 — End: 2016-12-22
  Administered 2016-12-22: 0 mg via INTRAVENOUS
  Administered 2016-12-22: 195 mg via INTRAVENOUS
  Filled 2016-12-22: qty 19.5

## 2016-12-22 MED ORDER — SODIUM CHLORIDE 0.9 % INTRAVENOUS SOLUTION
50.0000 mg/m2 | Freq: Once | INTRAVENOUS | Status: AC
Start: 2016-12-22 — End: 2016-12-22
  Administered 2016-12-22: 78 mg via INTRAVENOUS
  Administered 2016-12-22: 0 mg via INTRAVENOUS
  Filled 2016-12-22: qty 13

## 2016-12-22 MED ORDER — FAMOTIDINE (PF) 20 MG/50 ML IN 0.9 % NACL (ISO) INTRAVENOUS PIGGYBACK
20.0000 mg | INJECTION | Freq: Once | INTRAVENOUS | Status: AC
Start: 2016-12-22 — End: 2016-12-22
  Administered 2016-12-22: 50 mL via INTRAVENOUS
  Administered 2016-12-22: 0 mL via INTRAVENOUS
  Filled 2016-12-22: qty 50

## 2016-12-22 MED ORDER — DIPHENHYDRAMINE 50 MG/ML INJECTION SOLUTION
50.0000 mg | Freq: Once | INTRAMUSCULAR | Status: AC
Start: 2016-12-22 — End: 2016-12-22
  Administered 2016-12-22: 50 mg via INTRAVENOUS
  Filled 2016-12-22: qty 1

## 2016-12-22 MED ORDER — HEPARIN, PORCINE (PF) 100 UNIT/ML INTRAVENOUS SYRINGE
5.0000 mL | INJECTION | Freq: Once | INTRAVENOUS | Status: AC
Start: 2016-12-22 — End: 2016-12-22
  Administered 2016-12-22: 5 mL
  Filled 2016-12-22: qty 5

## 2016-12-22 NOTE — Discharge Instructions (Signed)
Carboplatin Solution for injection  What is this medicine?  CARBOPLATIN (KAR boe pla tin) is a chemotherapy drug. It targets fast dividing cells, like cancer cells, and causes these cells to die. This medicine is used to treat ovarian cancer and many other cancers.  This medicine may be used for other purposes; ask your health care provider or pharmacist if you have questions.  What should I tell my health care provider before I take this medicine?  They need to know if you have any of these conditions:   blood disorders   hearing problems   kidney disease   recent or ongoing radiation therapy   an unusual or allergic reaction to carboplatin, cisplatin, other chemotherapy, other medicines, foods, dyes, or preservatives   pregnant or trying to get pregnant   breast-feeding  How should I use this medicine?  This drug is usually given as an infusion into a vein. It is administered in a hospital or clinic by a specially trained health care professional.  Talk to your pediatrician regarding the use of this medicine in children. Special care may be needed.  Overdosage: If you think you have taken too much of this medicine contact a poison control center or emergency room at once.  NOTE: This medicine is only for you. Do not share this medicine with others.  What if I miss a dose?  It is important not to miss a dose. Call your doctor or health care professional if you are unable to keep an appointment.  What may interact with this medicine?   medicines for seizures   medicines to increase blood counts like filgrastim, pegfilgrastim, sargramostim   some antibiotics like amikacin, gentamicin, neomycin, streptomycin, tobramycin   vaccines  Talk to your doctor or health care professional before taking any of these medicines:   acetaminophen   aspirin   ibuprofen   ketoprofen   naproxen  This list may not describe all possible interactions. Give your health care provider a list of all the medicines, herbs,  non-prescription drugs, or dietary supplements you use. Also tell them if you smoke, drink alcohol, or use illegal drugs. Some items may interact with your medicine.  What should I watch for while using this medicine?  Your condition will be monitored carefully while you are receiving this medicine. You will need important blood work done while you are taking this medicine.  This drug may make you feel generally unwell. This is not uncommon, as chemotherapy can affect healthy cells as well as cancer cells. Report any side effects. Continue your course of treatment even though you feel ill unless your doctor tells you to stop.  In some cases, you may be given additional medicines to help with side effects. Follow all directions for their use.  Call your doctor or health care professional for advice if you get a fever, chills or sore throat, or other symptoms of a cold or flu. Do not treat yourself. This drug decreases your body's ability to fight infections. Try to avoid being around people who are sick.  This medicine may increase your risk to bruise or bleed. Call your doctor or health care professional if you notice any unusual bleeding.  Be careful brushing and flossing your teeth or using a toothpick because you may get an infection or bleed more easily. If you have any dental work done, tell your dentist you are receiving this medicine.  Avoid taking products that contain aspirin, acetaminophen, ibuprofen, naproxen, or ketoprofen unless instructed  by your doctor. These medicines may hide a fever.  Do not become pregnant while taking this medicine. Women should inform their doctor if they wish to become pregnant or think they might be pregnant. There is a potential for serious side effects to an unborn child. Talk to your health care professional or pharmacist for more information. Do not breast-feed an infant while taking this medicine.  What side effects may I notice from receiving this medicine?  Side effects  that you should report to your doctor or health care professional as soon as possible:   allergic reactions like skin rash, itching or hives, swelling of the face, lips, or tongue   signs of infection - fever or chills, cough, sore throat, pain or difficulty passing urine   signs of decreased platelets or bleeding - bruising, pinpoint red spots on the skin, black, tarry stools, nosebleeds   signs of decreased red blood cells - unusually weak or tired, fainting spells, lightheadedness   breathing problems   changes in hearing   changes in vision   chest pain   high blood pressure   low blood counts - This drug may decrease the number of white blood cells, red blood cells and platelets. You may be at increased risk for infections and bleeding.   nausea and vomiting   pain, swelling, redness or irritation at the injection site   pain, tingling, numbness in the hands or feet   problems with balance, talking, walking   trouble passing urine or change in the amount of urine  Side effects that usually do not require medical attention (report to your doctor or health care professional if they continue or are bothersome):   hair loss   loss of appetite   metallic taste in the mouth or changes in taste  This list may not describe all possible side effects. Call your doctor for medical advice about side effects. You may report side effects to FDA at 1-800-FDA-1088.  Where should I keep my medicine?  This drug is given in a hospital or clinic and will not be stored at home.  NOTE:This sheet is a summary. It may not cover all possible information. If you have questions about this medicine, talk to your doctor, pharmacist, or health care provider. Copyright 2018 [employer]        Paclitaxel Solution for injection  What is this medicine?  PACLITAXEL (PAK li TAX el) is a chemotherapy drug. It targets fast dividing cells, like cancer cells, and causes these cells to die. This medicine is used to treat ovarian  cancer, breast cancer, and other cancers.  This medicine may be used for other purposes; ask your health care provider or pharmacist if you have questions.  What should I tell my health care provider before I take this medicine?  They need to know if you have any of these conditions:   blood disorders   irregular heartbeat   infection (especially a virus infection such as chickenpox, cold sores, or herpes)   liver disease   previous or ongoing radiation therapy   an unusual or allergic reaction to paclitaxel, alcohol, polyoxyethylated castor oil, other chemotherapy agents, other medicines, foods, dyes, or preservatives   pregnant or trying to get pregnant   breast-feeding  How should I use this medicine?  This drug is given as an infusion into a vein. It is administered in a hospital or clinic by a specially trained health care professional.  Talk to your pediatrician regarding the  use of this medicine in children. Special care may be needed.  Overdosage: If you think you have taken too much of this medicine contact a poison control center or emergency room at once.  NOTE: This medicine is only for you. Do not share this medicine with others.  What if I miss a dose?  It is important not to miss your dose. Call your doctor or health care professional if you are unable to keep an appointment.  What may interact with this medicine?  Do not take this medicine with any of the following medications:   disulfiram   metronidazole  This medicine may also interact with the following medications:   cyclosporine   diazepam   ketoconazole   medicines to increase blood counts like filgrastim, pegfilgrastim, sargramostim   other chemotherapy drugs like cisplatin, doxorubicin, epirubicin, etoposide, teniposide, vincristine   quinidine   testosterone   vaccines   verapamil  Talk to your doctor or health care professional before taking any of these  medicines:   acetaminophen   aspirin   ibuprofen   ketoprofen   naproxen  This list may not describe all possible interactions. Give your health care provider a list of all the medicines, herbs, non-prescription drugs, or dietary supplements you use. Also tell them if you smoke, drink alcohol, or use illegal drugs. Some items may interact with your medicine.  What should I watch for while using this medicine?  Your condition will be monitored carefully while you are receiving this medicine. You will need important blood work done while you are taking this medicine.  This drug may make you feel generally unwell. This is not uncommon, as chemotherapy can affect healthy cells as well as cancer cells. Report any side effects. Continue your course of treatment even though you feel ill unless your doctor tells you to stop.  This medicine can cause serious allergic reactions. To reduce your risk you will need to take other medicine(s) before treatment with this medicine.  In some cases, you may be given additional medicines to help with side effects. Follow all directions for their use.  Call your doctor or health care professional for advice if you get a fever, chills or sore throat, or other symptoms of a cold or flu. Do not treat yourself. This drug decreases your body's ability to fight infections. Try to avoid being around people who are sick.  This medicine may increase your risk to bruise or bleed. Call your doctor or health care professional if you notice any unusual bleeding.  Be careful brushing and flossing your teeth or using a toothpick because you may get an infection or bleed more easily. If you have any dental work done, tell your dentist you are receiving this medicine.  Avoid taking products that contain aspirin, acetaminophen, ibuprofen, naproxen, or ketoprofen unless instructed by your doctor. These medicines may hide a fever.  Do not become pregnant while taking this medicine. Women should inform  their doctor if they wish to become pregnant or think they might be pregnant. There is a potential for serious side effects to an unborn child. Talk to your health care professional or pharmacist for more information. Do not breast-feed an infant while taking this medicine.  Men are advised not to father a child while receiving this medicine.  What side effects may I notice from receiving this medicine?  Side effects that you should report to your doctor or health care professional as soon as possible:   allergic  reactions like skin rash, itching or hives, swelling of the face, lips, or tongue   low blood counts - This drug may decrease the number of white blood cells, red blood cells and platelets. You may be at increased risk for infections and bleeding.   signs of infection - fever or chills, cough, sore throat, pain or difficulty passing urine   signs of decreased platelets or bleeding - bruising, pinpoint red spots on the skin, black, tarry stools, nosebleeds   signs of decreased red blood cells - unusually weak or tired, fainting spells, lightheadedness   breathing problems   chest pain   high or low blood pressure   mouth sores   nausea and vomiting   pain, swelling, redness or irritation at the injection site   pain, tingling, numbness in the hands or feet   slow or irregular heartbeat   swelling of the ankle, feet, hands  Side effects that usually do not require medical attention (report to your doctor or health care professional if they continue or are bothersome):   bone pain   complete hair loss including hair on your head, underarms, pubic hair, eyebrows, and eyelashes   changes in the color of fingernails   diarrhea   loosening of the fingernails   loss of appetite   muscle or joint pain   red flush to skin   sweating  This list may not describe all possible side effects. Call your doctor for medical advice about side effects. You may report side effects to FDA at  1-800-FDA-1088.  Where should I keep my medicine?  This drug is given in a hospital or clinic and will not be stored at home.  NOTE:This sheet is a summary. It may not cover all possible information. If you have questions about this medicine, talk to your doctor, pharmacist, or health care provider. Copyright 2018 Elsevier

## 2016-12-22 NOTE — Nurses Notes (Signed)
Patient discharged home.  AVS reviewed with patient.  A written copy of the AVS and discharge instructions was given to the patient.  Questions sufficiently answered as needed.  Patient encouraged to follow up with PCP as indicated.  In the event of an emergency, patient instructed to call 911 or go to the nearest emergency room.

## 2016-12-23 ENCOUNTER — Encounter (HOSPITAL_BASED_OUTPATIENT_CLINIC_OR_DEPARTMENT_OTHER): Payer: Self-pay | Admitting: Hematology & Oncology

## 2016-12-23 NOTE — Nursing Note (Signed)
Received fax from [facility] stating request for authorization is approved. Approval # [id number] covers [id number] (carboplatin) dx: C34.90 eff 12/19/16-06/16/17. Also, I was previously informed that [id number] (taxol) DOES require authorization as well however, per approval letter [phone] was removed from the authorization request because it does NOT require authorization. Emailed Dawn B ok to schedule and letter of approval scanned to [employer]

## 2016-12-23 NOTE — Nursing Note (Signed)
Received fax from [facility]

## 2016-12-25 ENCOUNTER — Encounter (HOSPITAL_BASED_OUTPATIENT_CLINIC_OR_DEPARTMENT_OTHER): Payer: Self-pay | Admitting: Radiology

## 2016-12-26 ENCOUNTER — Ambulatory Visit: Payer: BC Managed Care – PPO | Attending: Hematology & Oncology

## 2016-12-26 DIAGNOSIS — C3490 Malignant neoplasm of unspecified part of unspecified bronchus or lung: Secondary | ICD-10-CM

## 2016-12-26 DIAGNOSIS — Z5111 Encounter for antineoplastic chemotherapy: Principal | ICD-10-CM | POA: Insufficient documentation

## 2016-12-26 LAB — COMPREHENSIVE METABOLIC PROFILE - BMC/JMC ONLY
ALBUMIN/GLOBULIN RATIO: 1.1 (ref 0.8–2.0)
ALBUMIN: 3.4 g/dL — ABNORMAL LOW (ref 3.5–5.0)
ALKALINE PHOSPHATASE: 57 U/L (ref 38–126)
ALT (SGPT): 17 U/L (ref 14–54)
ALT (SGPT): 17 U/L (ref 14–54)
ANION GAP: 8 mmol/L (ref 3–11)
AST (SGOT): 22 U/L (ref 15–41)
BILIRUBIN TOTAL: 0.5 mg/dL (ref 0.3–1.2)
BUN/CREA RATIO: 13 (ref 6–22)
BUN: 9 mg/dL (ref 6–20)
CALCIUM: 9.3 mg/dL (ref 8.8–10.2)
CHLORIDE: 103 mmol/L (ref 101–111)
CO2 TOTAL: 26 mmol/L (ref 22–32)
CREATININE: 0.69 mg/dL (ref 0.44–1.00)
ESTIMATED GFR: >60 mL/min/1.73mˆ2 (ref >60)
GLUCOSE: 103 mg/dL (ref 70–110)
POTASSIUM: 4.2 mmol/L (ref 3.4–5.1)
PROTEIN TOTAL: 6.6 g/dL (ref 6.4–8.3)
SODIUM: 137 mmol/L (ref 136–145)

## 2016-12-26 LAB — CBC W/AUTO DIFF
BASOPHIL #: 0 10*3/uL (ref 0.00–0.10)
BASOPHIL #: 0 x10ˆ3/uL (ref 0.00–0.10)
BASOPHIL %: 1 % (ref 0–3)
EOSINOPHIL #: 0 x10ˆ3/uL (ref 0.00–0.50)
EOSINOPHIL %: 1 % (ref 0–5)
HCT: 39.6 % (ref 36.0–45.0)
HGB: 13 g/dL (ref 12.0–15.5)
LYMPHOCYTE #: 0.8 10*3/uL — ABNORMAL LOW (ref 1.00–4.80)
LYMPHOCYTE #: 0.8 x10ˆ3/uL — ABNORMAL LOW (ref 1.00–4.80)
LYMPHOCYTE %: 21 % (ref 15–43)
MCH: 28.8 pg (ref 27.5–33.2)
MCHC: 32.9 g/dL (ref 32.0–36.0)
MCV: 87.4 fL (ref 82.0–97.0)
MONOCYTE #: 0.2 x10ˆ3/uL (ref 0.20–0.90)
MONOCYTE %: 5 % (ref 5–12)
MPV: 9.3 fL (ref 7.4–10.5)
MPV: 9.3 fL (ref 7.4–10.5)
NEUTROPHIL #: 2.7 x10ˆ3/uL (ref 1.50–6.50)
NEUTROPHIL %: 73 % (ref 43–76)
NEUTROPHIL %: 73 % (ref 43–76)
PLATELETS: 321 x10ˆ3/uL (ref 150–450)
RBC: 4.53 x10ˆ6/uL (ref 4.00–5.10)
RDW: 14.2 % (ref 11.0–16.0)
WBC: 3.8 x10ˆ3/uL — ABNORMAL LOW (ref 4.0–11.0)

## 2016-12-26 LAB — MAGNESIUM: MAGNESIUM: 2 mg/dL (ref 1.4–2.1)

## 2016-12-26 NOTE — Progress Notes (Signed)
Accessed patient chart to screen for appropriateness for clinical trial.

## 2016-12-29 ENCOUNTER — Inpatient Hospital Stay
Admission: RE | Admit: 2016-12-29 | Discharge: 2016-12-29 | Disposition: A | Discharge status: Still In Hospital | Payer: BC Managed Care – PPO | Source: Ambulatory Visit | Attending: Hematology & Oncology | Admitting: Hematology & Oncology

## 2016-12-29 VITALS — BP 130/63 | HR 106 | Temp 97.5°F | Resp 20 | Ht 60.75 in | Wt 121.2 lb

## 2016-12-29 DIAGNOSIS — Z5111 Encounter for antineoplastic chemotherapy: Secondary | ICD-10-CM

## 2016-12-29 DIAGNOSIS — C3490 Malignant neoplasm of unspecified part of unspecified bronchus or lung: Secondary | ICD-10-CM

## 2016-12-29 MED ORDER — FAMOTIDINE (PF) 20 MG/50 ML IN 0.9 % NACL (ISO) INTRAVENOUS PIGGYBACK
20.0000 mg | INJECTION | Freq: Once | INTRAVENOUS | Status: AC
Start: 2016-12-29 — End: 2016-12-29
  Administered 2016-12-29: 14:00:00 50 mL via INTRAVENOUS
  Administered 2016-12-29: 0 mL via INTRAVENOUS
  Filled 2016-12-29: qty 50

## 2016-12-29 MED ORDER — SODIUM CHLORIDE 0.9 % INTRAVENOUS SOLUTION
INTRAVENOUS | Status: DC
Start: 2016-12-29 — End: 2016-12-30

## 2016-12-29 MED ORDER — DIPHENHYDRAMINE 50 MG/ML INJECTION SOLUTION
50.0000 mg | Freq: Once | INTRAMUSCULAR | Status: AC
Start: 2016-12-29 — End: 2016-12-29
  Administered 2016-12-29: 50 mg via INTRAVENOUS
  Filled 2016-12-29: qty 1

## 2016-12-29 MED ORDER — ONDANSETRON HCL 2 MG/ML INTRAVENOUS SOLUTION
Freq: Once | INTRAVENOUS | Status: AC
Start: 2016-12-29 — End: 2016-12-29
  Filled 2016-12-29: qty 4

## 2016-12-29 MED ORDER — SODIUM CHLORIDE 0.9 % INTRAVENOUS SOLUTION
50.0000 mg/m2 | Freq: Once | INTRAVENOUS | Status: AC
Start: 2016-12-29 — End: 2016-12-29
  Administered 2016-12-29: 0 mg via INTRAVENOUS
  Administered 2016-12-29: 78 mg via INTRAVENOUS
  Filled 2016-12-29: qty 13

## 2016-12-29 MED ORDER — SODIUM CHLORIDE 0.9 % (FLUSH) INJECTION SYRINGE
10.0000 mL | INJECTION | Freq: Once | INTRAMUSCULAR | Status: AC
Start: 2016-12-29 — End: 2016-12-29
  Administered 2016-12-29: 10 mL

## 2016-12-29 MED ORDER — HEPARIN, PORCINE (PF) 100 UNIT/ML INTRAVENOUS SYRINGE
5.0000 mL | INJECTION | Freq: Once | INTRAVENOUS | Status: AC
Start: 2016-12-29 — End: 2016-12-29
  Administered 2016-12-29: 5 mL
  Filled 2016-12-29: qty 5

## 2016-12-29 MED ORDER — SODIUM CHLORIDE 0.9 % INTRAVENOUS SOLUTION
195.4000 mg | Freq: Once | INTRAVENOUS | Status: AC
Start: 2016-12-29 — End: 2016-12-29
  Administered 2016-12-29: 195 mg via INTRAVENOUS
  Administered 2016-12-29: 0 mg via INTRAVENOUS
  Filled 2016-12-29: qty 19.5

## 2016-12-29 MED ADMIN — Medication: INTRAVENOUS | @ 14:00:00

## 2016-12-29 MED ADMIN — erythromycin 5 mg/gram (0.5 %) eye ointment: INTRAVENOUS | @ 15:00:00 | NDC 17478082401

## 2016-12-29 MED ADMIN — lactated Ringers intravenous solution: INTRAVENOUS | @ 16:00:00 | NDC 00264775000

## 2016-12-29 MED ADMIN — morphine 2 mg/mL intravenous syringe: INTRAVENOUS | @ 14:00:00

## 2016-12-29 NOTE — Discharge Instructions (Signed)
Discharge Instructions for Chemotherapy  Your healthcare provider prescribed a type of medicine therapy for you called chemotherapy. Healthcare providers prescribe chemotherapy for many different types of illnesses, including cancer. There are many types of chemotherapy. This sheet provides general guidelines on how you can take care ofyourself after your chemotherapy.  Mouth care  Don't be discouraged if you get mouth sores, even if you are following all your healthcare provider's instructions. Many people get mouth sores as a side effect of chemotherapy. Here's what you can do to prevent mouth sores:   Keep your mouth clean. Brush your teeth with a soft-bristle toothbrush after every meal.   Ask if you should use a toothpaste with fluoride, or a mixture of 1 teaspoon of salt in 8-ounces of water to brush your teeth.   Use an oral swab or special soft toothbrush if your gums bleed during regular brushing.   Don't use dental floss if it causes your gums to bleed.   Use any mouthwashes given to you as directed.   If you can't tolerate regular methods, use salt and baking soda to clean your mouth. Mix1teaspoonof salt and1teaspoon of baking soda in 1 quart of warm water. Swish and spit.   If you wear dentures, you may be told to wear them only when you eat, ask your healthcare provider. Clean dentures twice a day and soak in antimicrobial solution when you aren't wearing them. Rinse your mouth after each meal.   Watch your mouth and tonguefor white patches. Thismay bea sign of a type of yeast infection (thrush), a common side effect of chemotherapy. Be sure to tell your healthcare provider about these patches. Medicine can be prescribed to treat it.  Other home care  Here's what else you can do:   Try to exercise. Exercise keeps you strong and keeps your heart and lungs active. Walking and yoga are good types of exercise.   Keep clean. During chemotherapy, your body can't fight infection very  well. Take short baths or showers.   Wash your hands before you eat and after going to the bathroom.   Use moisturizing soap. Chemotherapy can make your skin dry.   Apply moisturizing lotion several times a day to help relieve dry skin.   Don't take very hot or very cold showers or baths.   Don't be surprised if your chemotherapy causes slight burns to your skin--usually on the hands and feet. Some medicines used in high doses cause this to happen. Ask for a special cream to help relieve the burn and protect your skin.   Avoid people who are sick with illnesses and diseases you could catch, such as colds, flu, measles, or chicken pox as well as people who have recently had vaccinations for these illnesses.   Let your healthcare provider know if your throat is sore. You may have an infection that needs treatment.   Remember, many patients feel sick and lose their appetites during treatment. Eat small meals several times a day to keep your strength up:   Choose bland foods with little taste or smell if you are reacting strongly to food.   Be sure to cook all food thoroughly. This kills bacteria and helps you avoid infection.   Eat foods that are soft. Soft foods are less likely to cause stomach irritation.   Try to eat a variety of foods for a well-balanced diet. Drink plenty of fluids and eat foods with fiber to avoid constipation.  When to call your healthcare  provider  Call your healthcare provider right away if you have any of the following:   Unexplained bleeding   Trouble concentrating   Ongoing fatigue   Shortness of breath, wheezing, trouble breathing, or bad cough   Rapid, irregular heartbeat, or chest pain   Dizziness, lightheadedness   Constant feeling of being cold   Hives oracut or rash that swells, turns red, feels hot or painful, or begins to ooze   Burning when you urinate   Feverof100.51F (38C) orhigher, oras directed by your healthcare provider   Date Last Reviewed:  10/15/2014   2000-2017 [employer]. [address] [place] [address]. All rights reserved. This information is not intended as a substitute for professional medical care. Always follow your healthcare professional's instructions.

## 2016-12-29 NOTE — Nurses Notes (Signed)
Patient discharged home with family.  AVS reviewed with patient/care giver.  A written copy of the AVS and discharge instructions was given to the patient/care giver.  Questions sufficiently answered as needed.  Patient/care giver encouraged to follow up with PCP as indicated.  In the event of an emergency, patient/care giver instructed to call 911 or go to the nearest emergency room.

## 2016-12-31 NOTE — Nurses Notes (Signed)
Came to office requesting to speak to a nurse.  C/O constipation and wanted to know what she could take.  I suggested  1/2 bottle of magnesium citrate today and repeat in the morning if no results.  Suggested adding stool softeners after her constipation is resolved.  Requested her to call me tomorrow and let me know how she is feeling.  Verbalized understanding. Bethann Humble, LPN

## 2017-01-03 ENCOUNTER — Ambulatory Visit (HOSPITAL_BASED_OUTPATIENT_CLINIC_OR_DEPARTMENT_OTHER): Payer: BC Managed Care – PPO | Attending: Hematology & Oncology

## 2017-01-03 DIAGNOSIS — C3490 Malignant neoplasm of unspecified part of unspecified bronchus or lung: Secondary | ICD-10-CM | POA: Insufficient documentation

## 2017-01-03 LAB — CBC WITH DIFF
HCT: 37.7 % (ref 36.0–45.0)
HGB: 12.5 g/dL (ref 12.0–15.5)
MCH: 28.9 pg (ref 27.5–33.2)
MCHC: 33.3 g/dL (ref 32.0–36.0)
MCV: 87 fL (ref 82.0–97.0)
MPV: 8.7 fL (ref 7.4–10.5)
PLATELETS: 215 x10ˆ3/uL (ref 150–450)
RBC: 4.33 x10ˆ6/uL (ref 4.00–5.10)
RDW: 13.9 % (ref 11.0–16.0)
WBC: 1.8 x10ˆ3/uL — CL (ref 4.0–11.0)

## 2017-01-03 LAB — MANUAL DIFFERENTIAL
BAND %: 1 % (ref 0–4)
BASOPHIL %: 1 % (ref 0–3)
BASOPHIL ABSOLUTE: 0.02 x10ˆ3/uL (ref 0.00–0.10)
EOSINOPHIL %: 3 % (ref 0–5)
EOSINOPHIL ABSOLUTE: 0.05 x10ˆ3/uL (ref 0.00–0.50)
LYMPHOCYTE %: 21 % (ref 15–43)
LYMPHOCYTE ABSOLUTE: 0.38 x10ˆ3/uL — ABNORMAL LOW (ref 1.00–4.80)
MONOCYTE %: 6 % (ref 5–12)
MONOCYTE ABSOLUTE: 0.11 x10ˆ3/uL — ABNORMAL LOW (ref 0.20–0.90)
NEUTROPHIL %: 68 % (ref 43–76)
NEUTROPHIL ABSOLUTE: 1.24 x10ˆ3/uL — ABNORMAL LOW (ref 1.50–6.50)
PLATELET ESTIMATE: ADEQUATE
RBC MORPHOLOGY COMMENT: NORMAL
WBC MORPHOLOGY COMMENT: NORMAL
WBC: 1.8 x10ˆ3/uL

## 2017-01-03 LAB — COMPREHENSIVE METABOLIC PROFILE - BMC/JMC ONLY
ALBUMIN/GLOBULIN RATIO: 1.1 (ref 0.8–2.0)
ALBUMIN: 3.5 g/dL (ref 3.5–5.0)
ALKALINE PHOSPHATASE: 57 U/L (ref 38–126)
ALT (SGPT): 18 U/L (ref 14–54)
ANION GAP: 8 mmol/L (ref 3–11)
AST (SGOT): 21 U/L (ref 15–41)
BILIRUBIN TOTAL: 0.4 mg/dL (ref 0.3–1.2)
BUN/CREA RATIO: 14 (ref 6–22)
BUN: 9 mg/dL (ref 6–20)
CALCIUM: 9.1 mg/dL (ref 8.8–10.2)
CHLORIDE: 104 mmol/L (ref 101–111)
CO2 TOTAL: 26 mmol/L (ref 22–32)
CREATININE: 0.64 mg/dL (ref 0.44–1.00)
ESTIMATED GFR: >60 mL/min/1.73mˆ2 (ref >60)
GLUCOSE: 94 mg/dL (ref 70–110)
POTASSIUM: 4.4 mmol/L (ref 3.4–5.1)
PROTEIN TOTAL: 6.6 g/dL (ref 6.4–8.3)
SODIUM: 138 mmol/L (ref 136–145)

## 2017-01-03 LAB — MAGNESIUM: MAGNESIUM: 1.9 mg/dL (ref 1.4–2.1)

## 2017-01-05 ENCOUNTER — Inpatient Hospital Stay (HOSPITAL_BASED_OUTPATIENT_CLINIC_OR_DEPARTMENT_OTHER)
Admission: RE | Admit: 2017-01-05 | Discharge: 2017-01-05 | Disposition: A | Discharge status: Still In Hospital | Payer: BC Managed Care – PPO | Source: Ambulatory Visit | Attending: Hematology & Oncology | Admitting: Hematology & Oncology

## 2017-01-05 DIAGNOSIS — C3490 Malignant neoplasm of unspecified part of unspecified bronchus or lung: Secondary | ICD-10-CM

## 2017-01-05 MED ORDER — SODIUM CHLORIDE 0.9 % INTRAVENOUS SOLUTION
INTRAVENOUS | Status: DC
Start: 2017-01-05 — End: 2017-01-06

## 2017-01-05 MED ORDER — DIPHENHYDRAMINE 50 MG/ML INJECTION SOLUTION
50.0000 mg | Freq: Once | INTRAMUSCULAR | Status: AC
Start: 2017-01-05 — End: 2017-01-05
  Administered 2017-01-05: 50 mg via INTRAVENOUS
  Filled 2017-01-05: qty 1

## 2017-01-05 MED ORDER — HEPARIN, PORCINE (PF) 100 UNIT/ML INTRAVENOUS SYRINGE
5.0000 mL | INJECTION | Freq: Once | INTRAVENOUS | Status: AC
Start: 2017-01-05 — End: 2017-01-05
  Administered 2017-01-05: 5 mL
  Filled 2017-01-05: qty 5

## 2017-01-05 MED ORDER — SODIUM CHLORIDE 0.9 % INTRAVENOUS SOLUTION
195.4000 mg | Freq: Once | INTRAVENOUS | Status: AC
Start: 2017-01-05 — End: 2017-01-05
  Administered 2017-01-05: 195 mg via INTRAVENOUS
  Administered 2017-01-05: 0 mg via INTRAVENOUS
  Filled 2017-01-05: qty 19.5

## 2017-01-05 MED ORDER — SODIUM CHLORIDE 0.9 % INTRAVENOUS SOLUTION
50.0000 mg/m2 | Freq: Once | INTRAVENOUS | Status: AC
Start: 2017-01-05 — End: 2017-01-05
  Administered 2017-01-05: 78 mg via INTRAVENOUS
  Administered 2017-01-05: 0 mg via INTRAVENOUS
  Filled 2017-01-05: qty 13

## 2017-01-05 MED ORDER — FAMOTIDINE (PF) 20 MG/50 ML IN 0.9 % NACL (ISO) INTRAVENOUS PIGGYBACK
20.0000 mg | INJECTION | Freq: Once | INTRAVENOUS | Status: AC
Start: 2017-01-05 — End: 2017-01-05
  Administered 2017-01-05: 50 mL via INTRAVENOUS
  Administered 2017-01-05: 0 mL via INTRAVENOUS
  Filled 2017-01-05: qty 50

## 2017-01-05 MED ORDER — ONDANSETRON HCL 2 MG/ML INTRAVENOUS SOLUTION
Freq: Once | INTRAVENOUS | Status: AC
Start: 2017-01-05 — End: 2017-01-05
  Filled 2017-01-05: qty 4

## 2017-01-05 MED ORDER — SODIUM CHLORIDE 0.9 % (FLUSH) INJECTION SYRINGE
10.0000 mL | INJECTION | Freq: Once | INTRAMUSCULAR | Status: AC
Start: 2017-01-05 — End: 2017-01-05
  Administered 2017-01-05: 10 mL

## 2017-01-05 MED ADMIN — diphenhydrAMINE 50 mg/mL injection solution: INTRAVENOUS | @ 11:00:00

## 2017-01-05 NOTE — Nurses Notes (Signed)
Patient discharged home with family.  AVS reviewed with patient/care giver.  A written copy of the AVS and discharge instructions was given to the patient/care giver.  Questions sufficiently answered as needed.  Patient/care giver encouraged to follow up with PCP as indicated.  In the event of an emergency, patient/care giver instructed to call 911 or go to the nearest emergency room.

## 2017-01-09 ENCOUNTER — Ambulatory Visit: Payer: BC Managed Care – PPO | Attending: Hematology & Oncology

## 2017-01-09 DIAGNOSIS — C3490 Malignant neoplasm of unspecified part of unspecified bronchus or lung: Secondary | ICD-10-CM | POA: Insufficient documentation

## 2017-01-09 LAB — COMPREHENSIVE METABOLIC PROFILE - BMC/JMC ONLY
ALBUMIN/GLOBULIN RATIO: 1.3 (ref 0.8–2.0)
ALBUMIN: 3.5 g/dL (ref 3.5–5.0)
ALKALINE PHOSPHATASE: 60 U/L (ref 38–126)
ALT (SGPT): 17 U/L (ref 14–54)
ANION GAP: 8 mmol/L (ref 3–11)
BILIRUBIN TOTAL: 0.6 mg/dL (ref 0.3–1.2)
BUN/CREA RATIO: 13 (ref 6–22)
BUN: 10 mg/dL (ref 6–20)
CALCIUM: 9 mg/dL (ref 8.8–10.2)
CHLORIDE: 102 mmol/L (ref 101–111)
CO2 TOTAL: 25 mmol/L (ref 22–32)
CREATININE: 0.76 mg/dL (ref 0.44–1.00)
ESTIMATED GFR: >60 mL/min/1.73mˆ2 (ref >60)
GLUCOSE: 99 mg/dL (ref 70–110)
POTASSIUM: 4.1 mmol/L (ref 3.4–5.1)
POTASSIUM: 4.1 mmol/L (ref 3.4–5.1)
PROTEIN TOTAL: 6.2 g/dL — ABNORMAL LOW (ref 6.4–8.3)
SODIUM: 135 mmol/L — ABNORMAL LOW (ref 136–145)

## 2017-01-09 LAB — CBC WITH DIFF
BASOPHIL #: 0 x10ˆ3/uL (ref 0.00–0.10)
BASOPHIL %: 1 % (ref 0–3)
EOSINOPHIL #: 0 x10ˆ3/uL (ref 0.00–0.50)
EOSINOPHIL %: 1 % (ref 0–5)
HCT: 36.5 % (ref 36.0–45.0)
HGB: 12.3 g/dL (ref 12.0–15.5)
LYMPHOCYTE #: 0.3 x10ˆ3/uL — ABNORMAL LOW (ref 1.00–4.80)
LYMPHOCYTE %: 13 % — ABNORMAL LOW (ref 15–43)
MCH: 29.2 pg (ref 27.5–33.2)
MCHC: 33.8 g/dL (ref 32.0–36.0)
MCV: 86.3 fL (ref 82.0–97.0)
MONOCYTE #: 0.2 x10ˆ3/uL (ref 0.20–0.90)
MONOCYTE %: 9 % (ref 5–12)
MPV: 8.9 fL (ref 7.4–10.5)
NEUTROPHIL #: 1.6 x10ˆ3/uL (ref 1.50–6.50)
NEUTROPHIL %: 76 % (ref 43–76)
PLATELETS: 191 x10ˆ3/uL (ref 150–450)
RBC: 4.23 x10ˆ6/uL (ref 4.00–5.10)
RDW: 14.2 % (ref 11.0–16.0)
WBC: 2.1 x10ˆ3/uL — ABNORMAL LOW (ref 4.0–11.0)

## 2017-01-09 LAB — MAGNESIUM: MAGNESIUM: 1.9 mg/dL (ref 1.4–2.1)

## 2017-01-12 ENCOUNTER — Inpatient Hospital Stay
Admission: RE | Admit: 2017-01-12 | Discharge: 2017-01-12 | Disposition: A | Discharge status: Still In Hospital | Payer: BC Managed Care – PPO | Source: Ambulatory Visit | Attending: Hematology & Oncology | Admitting: Hematology & Oncology

## 2017-01-12 DIAGNOSIS — C3490 Malignant neoplasm of unspecified part of unspecified bronchus or lung: Secondary | ICD-10-CM

## 2017-01-12 DIAGNOSIS — Z86010 Personal history of colonic polyps: Secondary | ICD-10-CM | POA: Insufficient documentation

## 2017-01-12 DIAGNOSIS — E039 Hypothyroidism, unspecified: Secondary | ICD-10-CM | POA: Insufficient documentation

## 2017-01-12 DIAGNOSIS — Z79899 Other long term (current) drug therapy: Secondary | ICD-10-CM | POA: Insufficient documentation

## 2017-01-12 DIAGNOSIS — Z5111 Encounter for antineoplastic chemotherapy: Secondary | ICD-10-CM | POA: Insufficient documentation

## 2017-01-12 DIAGNOSIS — Z79890 Hormone replacement therapy (postmenopausal): Secondary | ICD-10-CM | POA: Insufficient documentation

## 2017-01-12 DIAGNOSIS — D708 Other neutropenia: Secondary | ICD-10-CM | POA: Insufficient documentation

## 2017-01-12 DIAGNOSIS — D61811 Other drug-induced pancytopenia: Secondary | ICD-10-CM | POA: Insufficient documentation

## 2017-01-12 MED ORDER — ONDANSETRON HCL 2 MG/ML INTRAVENOUS SOLUTION
Freq: Once | INTRAVENOUS | Status: AC
Start: 2017-01-12 — End: 2017-01-12
  Filled 2017-01-12: qty 4

## 2017-01-12 MED ORDER — SODIUM CHLORIDE 0.9 % INTRAVENOUS SOLUTION
50.0000 mg/m2 | Freq: Once | INTRAVENOUS | Status: AC
Start: 2017-01-12 — End: 2017-01-12
  Administered 2017-01-12: 78 mg via INTRAVENOUS
  Administered 2017-01-12: 0 mg via INTRAVENOUS
  Filled 2017-01-12: qty 13

## 2017-01-12 MED ORDER — SODIUM CHLORIDE 0.9 % (FLUSH) INJECTION SYRINGE
10.0000 mL | INJECTION | Freq: Once | INTRAMUSCULAR | Status: AC
Start: 2017-01-12 — End: 2017-01-12
  Administered 2017-01-12: 10 mL

## 2017-01-12 MED ORDER — DIPHENHYDRAMINE 50 MG/ML INJECTION SOLUTION
50.0000 mg | Freq: Once | INTRAMUSCULAR | Status: AC
Start: 2017-01-12 — End: 2017-01-12
  Administered 2017-01-12: 50 mg via INTRAVENOUS
  Filled 2017-01-12: qty 1

## 2017-01-12 MED ORDER — SODIUM CHLORIDE 0.9 % INTRAVENOUS SOLUTION
INTRAVENOUS | Status: DC
Start: 2017-01-12 — End: 2017-01-13

## 2017-01-12 MED ORDER — HEPARIN, PORCINE (PF) 100 UNIT/ML INTRAVENOUS SYRINGE
5.0000 mL | INJECTION | Freq: Once | INTRAVENOUS | Status: AC
Start: 2017-01-12 — End: 2017-01-12
  Administered 2017-01-12: 5 mL
  Filled 2017-01-12: qty 5

## 2017-01-12 MED ORDER — SODIUM CHLORIDE 0.9 % INTRAVENOUS SOLUTION
184.0000 mg | Freq: Once | INTRAVENOUS | Status: AC
Start: 2017-01-12 — End: 2017-01-12
  Administered 2017-01-12: 185 mg via INTRAVENOUS
  Administered 2017-01-12: 0 mg via INTRAVENOUS
  Filled 2017-01-12: qty 18.5

## 2017-01-12 MED ORDER — FAMOTIDINE (PF) 20 MG/50 ML IN 0.9 % NACL (ISO) INTRAVENOUS PIGGYBACK
20.0000 mg | INJECTION | Freq: Once | INTRAVENOUS | Status: AC
Start: 2017-01-12 — End: 2017-01-12
  Administered 2017-01-12: 0 mL via INTRAVENOUS
  Administered 2017-01-12: 50 mL via INTRAVENOUS
  Filled 2017-01-12: qty 50

## 2017-01-12 MED ADMIN — aprepitant 40 mg capsule: INTRAVENOUS | @ 11:00:00

## 2017-01-12 MED ADMIN — PANTOPRAZOLE 80MG IN NS 100ML CONTINUOUS INFUSION: INTRAVENOUS | @ 12:00:00 | NDC 00008400101

## 2017-01-12 NOTE — Nurses Notes (Signed)
Patient discharged home with family.  AVS reviewed with patient/care giver.  A written copy of the AVS and discharge instructions was given to the patient/care giver.  Questions sufficiently answered as needed.  Patient/care giver encouraged to follow up with PCP as indicated.  In the event of an emergency, patient/care giver instructed to call 911 or go to the nearest emergency room.

## 2017-01-12 NOTE — Discharge Instructions (Signed)
Discharge Instructions for Chemotherapy  Your healthcare provider prescribed a type of medicine therapy for you called chemotherapy. Healthcare providers prescribe chemotherapy for many different types of illnesses, including cancer. There are many types of chemotherapy. This sheet provides general guidelines on how you can take care ofyourself after your chemotherapy.  Mouth care  Don't be discouraged if you get mouth sores, even if you are following all your healthcare provider's instructions. Many people get mouth sores as a side effect of chemotherapy. Here's what you can do to prevent mouth sores:   Keep your mouth clean. Brush your teeth with a soft-bristle toothbrush after every meal.   Ask if you should use a toothpaste with fluoride, or a mixture of 1 teaspoon of salt in 8-ounces of water to brush your teeth.   Use an oral swab or special soft toothbrush if your gums bleed during regular brushing.   Don't use dental floss if it causes your gums to bleed.   Use any mouthwashes given to you as directed.   If you can't tolerate regular methods, use salt and baking soda to clean your mouth. Mix1teaspoonof salt and1teaspoon of baking soda in 1 quart of warm water. Swish and spit.   If you wear dentures, you may be told to wear them only when you eat, ask your healthcare provider. Clean dentures twice a day and soak in antimicrobial solution when you aren't wearing them. Rinse your mouth after each meal.   Watch your mouth and tonguefor white patches. Thismay bea sign of a type of yeast infection (thrush), a common side effect of chemotherapy. Be sure to tell your healthcare provider about these patches. Medicine can be prescribed to treat it.  Other home care  Here's what else you can do:   Try to exercise. Exercise keeps you strong and keeps your heart and lungs active. Walking and yoga are good types of exercise.   Keep clean. During chemotherapy, your body can't fight infection very  well. Take short baths or showers.   Wash your hands before you eat and after going to the bathroom.   Use moisturizing soap. Chemotherapy can make your skin dry.   Apply moisturizing lotion several times a day to help relieve dry skin.   Don't take very hot or very cold showers or baths.   Don't be surprised if your chemotherapy causes slight burns to your skin--usually on the hands and feet. Some medicines used in high doses cause this to happen. Ask for a special cream to help relieve the burn and protect your skin.   Avoid people who are sick with illnesses and diseases you could catch, such as colds, flu, measles, or chicken pox as well as people who have recently had vaccinations for these illnesses.   Let your healthcare provider know if your throat is sore. You may have an infection that needs treatment.   Remember, many patients feel sick and lose their appetites during treatment. Eat small meals several times a day to keep your strength up:   Choose bland foods with little taste or smell if you are reacting strongly to food.   Be sure to cook all food thoroughly. This kills bacteria and helps you avoid infection.   Eat foods that are soft. Soft foods are less likely to cause stomach irritation.   Try to eat a variety of foods for a well-balanced diet. Drink plenty of fluids and eat foods with fiber to avoid constipation.  When to call your healthcare  provider  Call your healthcare provider right away if you have any of the following:   Unexplained bleeding   Trouble concentrating   Ongoing fatigue   Shortness of breath, wheezing, trouble breathing, or bad cough   Rapid, irregular heartbeat, or chest pain   Dizziness, lightheadedness   Constant feeling of being cold   Hives oracut or rash that swells, turns red, feels hot or painful, or begins to ooze   Burning when you urinate   Feverof100.49F (38C) orhigher, oras directed by your healthcare provider   Date Last Reviewed:  10/15/2014   2000-2017 [employer]. [address] [place] [address]. All rights reserved. This information is not intended as a substitute for professional medical care. Always follow your healthcare professional's instructions.

## 2017-01-12 NOTE — Cancer Center Note (Signed)
[facility]  @DEPTNAMEADD @  @PBBORG @  Return Chemotherapy Progress Note      Date: 01/12/2017  Name: KEIONNA KINNAIRD  MRN: [id number]  Referring Physician: No ref. provider found  Primary Care Provider: Lanier Ensign    Reason for visit/consultation Chemotherapy and Blood Work      Cancer History  Cancer Diagnosis: Non-small cell lung cancer diagnosed October 02, 2016 with intial evaluation and diagnosis made at [facility] in [place], [place].  Stage: Stage:  IIIb  Molecular studies:  We currently do not have ALK1, ROS1, EGFR or BRAF studies  Treatment: paclitaxel, carboplatin + RT    Interval History:  Information Obtained from: patient, daughter and history reviewed via medical record  Ms. Barnard presents for C2D1 paclitaxel/carboplatin + RT.  She has been tolerating her chemotherapy well, however has noticed more irritation when swallowing foods in her mid chest.  She was given Magic mouthwash by radiation therapy with some improvement in her symptoms, however she continues to feel this discomfort with any food or with hot beverages.  She otherwise denies any other complaints a 12 point review of systems.    ROS: Other than ROS in the HPI, all other systems were negative.    Past Medical History  Past Medical History:   Diagnosis Date   . Abnormal Pap smear     ASCUS, ASCUS cannot rule out HGSIL   . Colon polyp    . Raynaud's disease    . STD (sexually transmitted disease)     HSV   . Unspecified disorder of thyroid     Hypothyroidism         Current Outpatient Prescriptions   Medication Sig   . Conj Estrog-Medroxyprogest Ace 0.3-1.5 mg Oral Tablet Take 1 Tab by mouth Once a day   . cycloSPORINE (RESTASIS) 0.05 % Ophthalmic Dropperette Instill 1 Drop into both eyes Every 12 hours   . dexamethasone (DECADRON) 4 mg Oral Tablet Take 66m my mouth 12 hours before paclitaxel (TAXOL) infusion, and a second 20 mg dose 6 hours before.   . levothyroxine (SYNTHROID) 88 mcg Oral Tablet take 88 mcg by mouth Once a  day.   . loratadine (CLARITIN) 10 mg Oral Tablet take 10 mg by mouth Once a day.   . Magic Mouthwash 10 mL swish and spit Every 4 hours as needed for Pain Solution contains lidocaine viscous 2%, diphenhydramine 12.5 mg/5 mL, maalox.  Mix 1:1:1   . Nifedipine (ADALAT CC) 30 mg Oral Tablet Sustained Release take 30 mg by mouth Once a day.   . OMEGA-3 FATTY ACIDS (FISH OIL ORAL) take  by mouth Once a day.   . ondansetron (ZOFRAN ODT) 4 mg Oral Tablet, Rapid Dissolve Take 1 Tab (4 mg total) by mouth Every 8 hours as needed for nausea/vomiting          Physical Examination:  Most Recent Vitals       Treatment from 01/12/2017 in [facility]   Temperature 36.6 C (97.9 F) filed at... 01/12/2017 1009    Heart Rate (!)  117 filed at... 01/12/2017 1009    Respiratory Rate 20 filed at... 01/12/2017 1009    BP (Non-Invasive) 118/69 filed at... 01/12/2017 1009    Height 1.543 m (5' 0.75") filed at... 01/12/2017 1009    Weight 54.4 kg (120 lb) filed at... 01/12/2017 1009    BMI (Calculated) 22.91 filed at... 01/12/2017 1009    BSA (Calculated) 1.53 filed  at... 01/12/2017 1009      ECOG Status: 1 - Restricted in physically strenuous activity, but ambulatory and able to carry out work of a light or sedentary nature, e.g., light housework, office work.  General: appears in good health, appears stated age and no distress  Eyes: Conjunctiva clear., Pupils equal and round. , Sclera non-icteric.   HENT:ENT without erythema or injection, mucous membranes moist.  Neck: supple, symmetrical, trachea midline  Lungs: clear to auscultation bilaterally.   Cardiovascular: Regular rate and rhythm  Abdomen: soft, non-tender and non-distended  Extremities: extremities normal, atraumatic, no cyanosis or edema  Skin: Skin warm and dry  Neurologic: grossly normal  Psychiatric: AOx3  Access:  Sublcavian catheter/port    Labs  Results for orders placed or performed in visit on 01/09/17 (from the past 168 hour(s))    CBC/DIFF    Narrative    The following orders were created for panel order CBC/DIFF.  Procedure                               Abnormality         Status                     ---------                               -----------         ------                     CBC WITH VVZS[[id number]]                Abnormal            Final result                 Please view results for these tests on the individual orders.   COMPREHENSIVE METABOLIC PROFILE - [facility] ONLY   Result Value Ref Range    SODIUM 135 (L) 136 - 145 mmol/L    POTASSIUM 4.1 3.4 - 5.1 mmol/L    CHLORIDE 102 101 - 111 mmol/L    CO2 TOTAL 25 22 - 32 mmol/L    ANION GAP 8 3 - 11 mmol/L    BUN 10 6 - 20 mg/dL    CREATININE 0.76 0.44 - 1.00 mg/dL    BUN/CREA RATIO 13 6 - 22    ESTIMATED GFR >60 >60 mL/min/1.56m2    ALBUMIN 3.5 3.5 - 5.0 g/dL    CALCIUM 9.0 8.8 - 10.2 mg/dL    GLUCOSE 99 70 - 110 mg/dL    ALKALINE PHOSPHATASE 60 38 - 126 U/L    ALT (SGPT) 17 14 - 54 U/L    AST (SGOT) 20 15 - 41 U/L    BILIRUBIN TOTAL 0.6 0.3 - 1.2 mg/dL    PROTEIN TOTAL 6.2 (L) 6.4 - 8.3 g/dL    ALBUMIN/GLOBULIN RATIO 1.3 0.8 - 2.0   MAGNESIUM   Result Value Ref Range    MAGNESIUM 1.9 1.4 - 2.1 mg/dL   CBC WITH DIFF   Result Value Ref Range    WBC 2.1 (L) 4.0 - 11.0 x10^3/uL    RBC 4.23 4.00 - 5.10 x10^6/uL    HGB 12.3 12.0 - 15.5 g/dL    HCT 36.5 36.0 - 45.0 %    MCV 86.3 82.0 - 97.0  fL    MCH 29.2 27.5 - 33.2 pg    MCHC 33.8 32.0 - 36.0 g/dL    RDW 14.2 11.0 - 16.0 %    PLATELETS 191 150 - 450 x10^3/uL    MPV 8.9 7.4 - 10.5 fL    NEUTROPHIL % 76 43 - 76 %    LYMPHOCYTE % 13 (L) 15 - 43 %    MONOCYTE % 9 5 - 12 %    EOSINOPHIL % 1 0 - 5 %    BASOPHIL % 1 0 - 3 %    NEUTROPHIL # 1.60 1.50 - 6.50 x10^3/uL    LYMPHOCYTE # 0.30 (L) 1.00 - 4.80 x10^3/uL    MONOCYTE # 0.20 0.20 - 0.90 x10^3/uL    EOSINOPHIL # 0.00 0.00 - 0.50 x10^3/uL    BASOPHIL # 0.00 0.00 - 0.10 x10^3/uL          Radiology  reviewed    Other outside studies:    Assessment/Plan: Ms. 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  Her discomfort noted with food and warm beverages may be secondary to underlying pathology, and it was alleviated with Magic mouthwash.  I instructed the patient to attempt an acid such as Maalox which may coat the lining improve her symptoms.  Labs reviewed, port examined, can proceed with chemotherapy as planned.  1. Malignant neoplasm of lung, unspecified laterality, unspecified part of lung (CMS [place])      1. Chemotherapy:  Chemotherapy intent: Curative    Restaging imaging: upon completion    2. Side effects:  No adverse chemotherapy events    3. Supportive services  A. Pain: No pain  B. Wellbeing: No psychological issues  C. End of life discussion: Not on palliative treatment    Return to clinic in 1 week  Return for infusions: Weekly with RT    Carlena Hurl, MD    CC:  PCP General:  Lanier Ensign  [facility] [address] [place] [address]

## 2017-01-16 ENCOUNTER — Ambulatory Visit: Payer: BC Managed Care – PPO | Attending: Hematology & Oncology

## 2017-01-16 DIAGNOSIS — C3490 Malignant neoplasm of unspecified part of unspecified bronchus or lung: Secondary | ICD-10-CM

## 2017-01-16 LAB — CBC WITH DIFF
BASOPHIL #: 0 x10ˆ3/uL (ref 0.00–0.10)
BASOPHIL %: 1 % (ref 0–3)
EOSINOPHIL #: 0 x10?3/uL (ref 0.00–0.50)
EOSINOPHIL #: 0 x10ˆ3/uL (ref 0.00–0.50)
EOSINOPHIL %: 0 % (ref 0–5)
HCT: 36.3 % (ref 36.0–45.0)
HGB: 12.5 g/dL (ref 12.0–15.5)
LYMPHOCYTE #: 0.2 x10ˆ3/uL — ABNORMAL LOW (ref 1.00–4.80)
LYMPHOCYTE %: 11 % — ABNORMAL LOW (ref 15–43)
MCH: 29.7 pg (ref 27.5–33.2)
MCHC: 34.3 g/dL (ref 32.0–36.0)
MCV: 86.7 fL (ref 82.0–97.0)
MONOCYTE #: 0.3 x10ˆ3/uL (ref 0.20–0.90)
MONOCYTE %: 16 % — ABNORMAL HIGH (ref 5–12)
MPV: 8.6 fL (ref 7.4–10.5)
NEUTROPHIL #: 1.2 x10ˆ3/uL — ABNORMAL LOW (ref 1.50–6.50)
NEUTROPHIL %: 72 % (ref 43–76)
PLATELETS: 152 x10ˆ3/uL (ref 150–450)
RBC: 4.19 x10ˆ6/uL (ref 4.00–5.10)
RDW: 14.3 % (ref 11.0–16.0)
WBC: 1.6 x10ˆ3/uL — CL (ref 4.0–11.0)

## 2017-01-16 LAB — COMPREHENSIVE METABOLIC PROFILE - BMC/JMC ONLY
ALBUMIN/GLOBULIN RATIO: 1.3 (ref 0.8–2.0)
ALBUMIN: 3.5 g/dL (ref 3.5–5.0)
ALKALINE PHOSPHATASE: 61 U/L (ref 38–126)
ALT (SGPT): 16 U/L (ref 14–54)
ANION GAP: 7 mmol/L (ref 3–11)
AST (SGOT): 18 U/L (ref 15–41)
BILIRUBIN TOTAL: 0.4 mg/dL (ref 0.3–1.2)
BUN/CREA RATIO: 14 (ref 6–22)
BUN: 9 mg/dL (ref 6–20)
CALCIUM: 8.8 mg/dL (ref 8.8–10.2)
CHLORIDE: 101 mmol/L (ref 101–111)
CO2 TOTAL: 25 mmol/L (ref 22–32)
CREATININE: 0.66 mg/dL (ref 0.44–1.00)
ESTIMATED GFR: >60 mL/min/1.73mˆ2 (ref >60)
GLUCOSE: 104 mg/dL (ref 70–110)
POTASSIUM: 4.1 mmol/L (ref 3.4–5.1)
PROTEIN TOTAL: 6.2 g/dL — ABNORMAL LOW (ref 6.4–8.3)
SODIUM: 133 mmol/L — ABNORMAL LOW (ref 136–145)

## 2017-01-16 LAB — MAGNESIUM: MAGNESIUM: 1.9 mg/dL (ref 1.4–2.1)

## 2017-01-17 MED ORDER — DEXAMETHASONE 4 MG TABLET
ORAL_TABLET | ORAL | 3 refills | Status: DC
Start: 2017-01-17 — End: 2017-02-17

## 2017-01-19 ENCOUNTER — Inpatient Hospital Stay
Admission: RE | Admit: 2017-01-19 | Discharge: 2017-01-19 | Disposition: A | Discharge status: Still In Hospital | Payer: BC Managed Care – PPO | Source: Ambulatory Visit | Attending: Hematology & Oncology | Admitting: Hematology & Oncology

## 2017-01-19 DIAGNOSIS — C3490 Malignant neoplasm of unspecified part of unspecified bronchus or lung: Secondary | ICD-10-CM

## 2017-01-19 MED ORDER — ONDANSETRON HCL 2 MG/ML INTRAVENOUS SOLUTION
Freq: Once | INTRAVENOUS | Status: AC
Start: 2017-01-19 — End: 2017-01-19
  Filled 2017-01-19: qty 4

## 2017-01-19 MED ORDER — SODIUM CHLORIDE 0.9 % (FLUSH) INJECTION SYRINGE
10.0000 mL | INJECTION | Freq: Once | INTRAMUSCULAR | Status: AC
Start: 2017-01-19 — End: 2017-01-19
  Administered 2017-01-19: 10 mL

## 2017-01-19 MED ORDER — SODIUM CHLORIDE 0.9 % INTRAVENOUS SOLUTION
141.1500 mg | Freq: Once | INTRAVENOUS | Status: AC
Start: 2017-01-19 — End: 2017-01-19
  Administered 2017-01-19: 0 mg via INTRAVENOUS
  Administered 2017-01-19: 140 mg via INTRAVENOUS
  Filled 2017-01-19: qty 14

## 2017-01-19 MED ORDER — SODIUM CHLORIDE 0.9 % INTRAVENOUS SOLUTION
40.0000 mg/m2 | Freq: Once | INTRAVENOUS | Status: AC
Start: 2017-01-19 — End: 2017-01-19
  Administered 2017-01-19: 0 mg via INTRAVENOUS
  Administered 2017-01-19: 57 mg via INTRAVENOUS
  Filled 2017-01-19: qty 9.5

## 2017-01-19 MED ORDER — DEXAMETHASONE 4 MG TABLET: Tab | ORAL | 3 refills | 0 days | Status: DC

## 2017-01-19 MED ORDER — FAMOTIDINE (PF) 20 MG/50 ML IN 0.9 % NACL (ISO) INTRAVENOUS PIGGYBACK
20.0000 mg | INJECTION | Freq: Once | INTRAVENOUS | Status: AC
Start: 2017-01-19 — End: 2017-01-19
  Administered 2017-01-19: 0 mL via INTRAVENOUS
  Administered 2017-01-19: 50 mL via INTRAVENOUS
  Filled 2017-01-19: qty 50

## 2017-01-19 MED ORDER — DIPHENHYDRAMINE 50 MG/ML INJECTION SOLUTION
50.0000 mg | Freq: Once | INTRAMUSCULAR | Status: AC
Start: 2017-01-19 — End: 2017-01-19
  Administered 2017-01-19: 50 mg via INTRAVENOUS
  Filled 2017-01-19: qty 1

## 2017-01-19 MED ORDER — SODIUM CHLORIDE 0.9 % INTRAVENOUS SOLUTION
INTRAVENOUS | Status: DC
Start: 2017-01-19 — End: 2017-01-20

## 2017-01-19 MED ORDER — HEPARIN, PORCINE (PF) 100 UNIT/ML INTRAVENOUS SYRINGE
5.0000 mL | INJECTION | Freq: Once | INTRAVENOUS | Status: AC
Start: 2017-01-19 — End: 2017-01-19
  Administered 2017-01-19: 5 mL
  Filled 2017-01-19: qty 5

## 2017-01-19 MED ADMIN — Medication: INTRAVENOUS | @ 11:00:00

## 2017-01-19 MED ADMIN — sodium chloride 0.9 % intravenous solution: @ 14:00:00 | NDC 00338004904

## 2017-01-19 MED ADMIN — sodium chloride 0.9 % intravenous solution: INTRAVENOUS | @ 11:00:00 | NDC 00338004904

## 2017-01-19 MED ADMIN — acetaminophen 325 mg tablet: INTRAVENOUS | @ 12:00:00

## 2017-01-19 NOTE — Discharge Instructions (Signed)
Discharge Instructions for Chemotherapy  Your healthcare provider prescribed a type of medicine therapy for you called chemotherapy. Healthcare providers prescribe chemotherapy for many different types of illnesses, including cancer. There are many types of chemotherapy. This sheet provides general guidelines on how you can take care ofyourself after your chemotherapy.  Mouth care  Don't be discouraged if you get mouth sores, even if you are following all your healthcare provider's instructions. Many people get mouth sores as a side effect of chemotherapy. Here's what you can do to prevent mouth sores:   Keep your mouth clean. Brush your teeth with a soft-bristle toothbrush after every meal.   Ask if you should use a toothpaste with fluoride, or a mixture of 1 teaspoon of salt in 8-ounces of water to brush your teeth.   Use an oral swab or special soft toothbrush if your gums bleed during regular brushing.   Don't use dental floss if it causes your gums to bleed.   Use any mouthwashes given to you as directed.   If you can't tolerate regular methods, use salt and baking soda to clean your mouth. Mix1teaspoonof salt and1teaspoon of baking soda in 1 quart of warm water. Swish and spit.   If you wear dentures, you may be told to wear them only when you eat, ask your healthcare provider. Clean dentures twice a day and soak in antimicrobial solution when you aren't wearing them. Rinse your mouth after each meal.   Watch your mouth and tonguefor white patches. Thismay bea sign of a type of yeast infection (thrush), a common side effect of chemotherapy. Be sure to tell your healthcare provider about these patches. Medicine can be prescribed to treat it.  Other home care  Here's what else you can do:   Try to exercise. Exercise keeps you strong and keeps your heart and lungs active. Walking and yoga are good types of exercise.   Keep clean. During chemotherapy, your body can't fight infection very  well. Take short baths or showers.   Wash your hands before you eat and after going to the bathroom.   Use moisturizing soap. Chemotherapy can make your skin dry.   Apply moisturizing lotion several times a day to help relieve dry skin.   Don't take very hot or very cold showers or baths.   Don't be surprised if your chemotherapy causes slight burns to your skin--usually on the hands and feet. Some medicines used in high doses cause this to happen. Ask for a special cream to help relieve the burn and protect your skin.   Avoid people who are sick with illnesses and diseases you could catch, such as colds, flu, measles, or chicken pox as well as people who have recently had vaccinations for these illnesses.   Let your healthcare provider know if your throat is sore. You may have an infection that needs treatment.   Remember, many patients feel sick and lose their appetites during treatment. Eat small meals several times a day to keep your strength up:   Choose bland foods with little taste or smell if you are reacting strongly to food.   Be sure to cook all food thoroughly. This kills bacteria and helps you avoid infection.   Eat foods that are soft. Soft foods are less likely to cause stomach irritation.   Try to eat a variety of foods for a well-balanced diet. Drink plenty of fluids and eat foods with fiber to avoid constipation.  When to call your healthcare  provider  Call your healthcare provider right away if you have any of the following:   Unexplained bleeding   Trouble concentrating   Ongoing fatigue   Shortness of breath, wheezing, trouble breathing, or bad cough   Rapid, irregular heartbeat, or chest pain   Dizziness, lightheadedness   Constant feeling of being cold   Hives oracut or rash that swells, turns red, feels hot or painful, or begins to ooze   Burning when you urinate   Feverof100.23F (38C) orhigher, oras directed by your healthcare provider   Date Last Reviewed:  10/15/2014   2000-2017 [employer]. [address] [place] [address]. All rights reserved. This information is not intended as a substitute for professional medical care. Always follow your healthcare professional's instructions.

## 2017-01-19 NOTE — Nurses Notes (Signed)
Patient discharged home with family.  AVS reviewed with patient/care giver.  A written copy of the AVS and discharge instructions was given to the patient/care giver.  Questions sufficiently answered as needed.  Patient/care giver encouraged to follow up with PCP as indicated.  In the event of an emergency, patient/care giver instructed to call 911 or go to the nearest emergency room.

## 2017-01-23 ENCOUNTER — Ambulatory Visit: Payer: BC Managed Care – PPO | Attending: Hematology & Oncology

## 2017-01-23 DIAGNOSIS — C3490 Malignant neoplasm of unspecified part of unspecified bronchus or lung: Secondary | ICD-10-CM | POA: Insufficient documentation

## 2017-01-23 LAB — COMPREHENSIVE METABOLIC PROFILE - BMC/JMC ONLY
ALBUMIN/GLOBULIN RATIO: 1.4 (ref 0.8–2.0)
ALBUMIN: 3.6 g/dL (ref 3.5–5.0)
ALKALINE PHOSPHATASE: 64 U/L (ref 38–126)
ALT (SGPT): 15 U/L (ref 14–54)
ANION GAP: 9 mmol/L (ref 3–11)
AST (SGOT): 21 U/L (ref 15–41)
BILIRUBIN TOTAL: 0.5 mg/dL (ref 0.3–1.2)
BUN/CREA RATIO: 9 (ref 6–22)
BUN: 7 mg/dL (ref 6–20)
CALCIUM: 9 mg/dL (ref 8.8–10.2)
CHLORIDE: 99 mmol/L — ABNORMAL LOW (ref 101–111)
CO2 TOTAL: 26 mmol/L (ref 22–32)
CREATININE: 0.74 mg/dL (ref 0.44–1.00)
ESTIMATED GFR: >60 mL/min/1.73mˆ2 (ref >60)
GLUCOSE: 101 mg/dL (ref 70–110)
POTASSIUM: 4.2 mmol/L (ref 3.4–5.1)
PROTEIN TOTAL: 6.2 g/dL — ABNORMAL LOW (ref 6.4–8.3)
SODIUM: 134 mmol/L — ABNORMAL LOW (ref 136–145)

## 2017-01-23 LAB — CBC WITH DIFF
BASOPHIL #: 0 x10ˆ3/uL (ref 0.00–0.10)
BASOPHIL %: 1 % (ref 0–3)
EOSINOPHIL #: 0 x10ˆ3/uL (ref 0.00–0.50)
EOSINOPHIL %: 0 % (ref 0–5)
HCT: 36.3 % (ref 36.0–45.0)
HGB: 12.7 g/dL (ref 12.0–15.5)
LYMPHOCYTE #: 0.1 10*3/uL — ABNORMAL LOW (ref 1.00–4.80)
LYMPHOCYTE #: 0.1 x10ˆ3/uL — ABNORMAL LOW (ref 1.00–4.80)
LYMPHOCYTE %: 8 % — ABNORMAL LOW (ref 15–43)
MCH: 30.2 pg (ref 27.5–33.2)
MCHC: 34.9 g/dL (ref 32.0–36.0)
MCV: 86.6 fL (ref 82.0–97.0)
MONOCYTE #: 0.2 x10ˆ3/uL (ref 0.20–0.90)
MONOCYTE %: 12 % (ref 5–12)
MPV: 8.3 fL (ref 7.4–10.5)
NEUTROPHIL #: 1.3 x10ˆ3/uL — ABNORMAL LOW (ref 1.50–6.50)
NEUTROPHIL %: 79 % — ABNORMAL HIGH (ref 43–76)
PLATELETS: 198 x10ˆ3/uL (ref 150–450)
RBC: 4.19 x10ˆ6/uL (ref 4.00–5.10)
RDW: 15.2 % (ref 11.0–16.0)
WBC: 1.7 x10ˆ3/uL — CL (ref 4.0–11.0)

## 2017-01-23 LAB — MAGNESIUM: MAGNESIUM: 2.1 mg/dL (ref 1.4–2.1)

## 2017-01-26 ENCOUNTER — Inpatient Hospital Stay
Admission: RE | Admit: 2017-01-26 | Discharge: 2017-01-26 | Disposition: A | Discharge status: Still In Hospital | Payer: BC Managed Care – PPO | Source: Ambulatory Visit | Attending: Hematology & Oncology | Admitting: Hematology & Oncology

## 2017-01-26 DIAGNOSIS — C3490 Malignant neoplasm of unspecified part of unspecified bronchus or lung: Secondary | ICD-10-CM

## 2017-01-26 MED ORDER — SODIUM CHLORIDE 0.9 % INTRAVENOUS SOLUTION
40.0000 mg/m2 | Freq: Once | INTRAVENOUS | Status: AC
Start: 2017-01-26 — End: 2017-01-26
  Administered 2017-01-26: 57 mg via INTRAVENOUS
  Administered 2017-01-26: 0 mg via INTRAVENOUS
  Filled 2017-01-26: qty 9.5

## 2017-01-26 MED ORDER — SODIUM CHLORIDE 0.9 % INTRAVENOUS SOLUTION
135.4500 mg | Freq: Once | INTRAVENOUS | Status: AC
Start: 2017-01-26 — End: 2017-01-26
  Administered 2017-01-26: 0 mg via INTRAVENOUS
  Administered 2017-01-26: 135 mg via INTRAVENOUS
  Filled 2017-01-26: qty 13.5

## 2017-01-26 MED ORDER — DIPHENHYDRAMINE 50 MG/ML INJECTION SOLUTION
50.0000 mg | Freq: Once | INTRAMUSCULAR | Status: AC
Start: 2017-01-26 — End: 2017-01-26
  Administered 2017-01-26: 50 mg via INTRAVENOUS
  Filled 2017-01-26: qty 1

## 2017-01-26 MED ORDER — SODIUM CHLORIDE 0.9 % INTRAVENOUS SOLUTION
INTRAVENOUS | Status: DC
Start: 2017-01-26 — End: 2017-01-27

## 2017-01-26 MED ORDER — HEPARIN, PORCINE (PF) 100 UNIT/ML INTRAVENOUS SYRINGE
5.0000 mL | INJECTION | Freq: Once | INTRAVENOUS | Status: AC
Start: 2017-01-26 — End: 2017-01-26
  Administered 2017-01-26: 5 mL
  Filled 2017-01-26: qty 5

## 2017-01-26 MED ORDER — ONDANSETRON HCL 2 MG/ML INTRAVENOUS SOLUTION
Freq: Once | INTRAVENOUS | Status: AC
Start: 2017-01-26 — End: 2017-01-26
  Filled 2017-01-26: qty 4

## 2017-01-26 MED ORDER — SODIUM CHLORIDE 0.9 % (FLUSH) INJECTION SYRINGE
10.0000 mL | INJECTION | Freq: Once | INTRAMUSCULAR | Status: AC
Start: 2017-01-26 — End: 2017-01-26
  Administered 2017-01-26: 10 mL

## 2017-01-26 MED ORDER — SODIUM CHLORIDE 0.9 % IV BOLUS
1000.0000 mL | INJECTION | Freq: Once | Status: AC
Start: 2017-01-26 — End: 2017-01-26
  Administered 2017-01-26: 0 mL via INTRAVENOUS
  Administered 2017-01-26: 1000 mL via INTRAVENOUS

## 2017-01-26 MED ORDER — FAMOTIDINE (PF) 20 MG/50 ML IN 0.9 % NACL (ISO) INTRAVENOUS PIGGYBACK
20.0000 mg | INJECTION | Freq: Once | INTRAVENOUS | Status: AC
Start: 2017-01-26 — End: 2017-01-26
  Administered 2017-01-26: 0 mL via INTRAVENOUS
  Administered 2017-01-26: 50 mL via INTRAVENOUS
  Filled 2017-01-26: qty 50

## 2017-01-26 NOTE — Nurses Notes (Signed)
Patient discharged home with family.  AVS reviewed with patient/care giver.  A written copy of the AVS and discharge instructions was given to the patient/care giver.  Questions sufficiently answered as needed.  Patient/care giver encouraged to follow up with PCP as indicated.  In the event of an emergency, patient/care giver instructed to call 911 or go to the nearest emergency room.

## 2017-01-26 NOTE — Discharge Instructions (Signed)
Discharge Instructions for Chemotherapy  Your healthcare provider prescribed a type of medicine therapy for you called chemotherapy. Healthcare providers prescribe chemotherapy for many different types of illnesses, including cancer. There are many types of chemotherapy. This sheet provides general guidelines on how you can take care ofyourself after your chemotherapy.  Mouth care  Don't be discouraged if you get mouth sores, even if you are following all your healthcare provider's instructions. Many people get mouth sores as a side effect of chemotherapy. Here's what you can do to prevent mouth sores:   Keep your mouth clean. Brush your teeth with a soft-bristle toothbrush after every meal.   Ask if you should use a toothpaste with fluoride, or a mixture of 1 teaspoon of salt in 8-ounces of water to brush your teeth.   Use an oral swab or special soft toothbrush if your gums bleed during regular brushing.   Don't use dental floss if it causes your gums to bleed.   Use any mouthwashes given to you as directed.   If you can't tolerate regular methods, use salt and baking soda to clean your mouth. Mix1teaspoonof salt and1teaspoon of baking soda in 1 quart of warm water. Swish and spit.   If you wear dentures, you may be told to wear them only when you eat, ask your healthcare provider. Clean dentures twice a day and soak in antimicrobial solution when you aren't wearing them. Rinse your mouth after each meal.   Watch your mouth and tonguefor white patches. Thismay bea sign of a type of yeast infection (thrush), a common side effect of chemotherapy. Be sure to tell your healthcare provider about these patches. Medicine can be prescribed to treat it.  Other home care  Here's what else you can do:   Try to exercise. Exercise keeps you strong and keeps your heart and lungs active. Walking and yoga are good types of exercise.   Keep clean. During chemotherapy, your body can't fight infection very  well. Take short baths or showers.   Wash your hands before you eat and after going to the bathroom.   Use moisturizing soap. Chemotherapy can make your skin dry.   Apply moisturizing lotion several times a day to help relieve dry skin.   Don't take very hot or very cold showers or baths.   Don't be surprised if your chemotherapy causes slight burns to your skin--usually on the hands and feet. Some medicines used in high doses cause this to happen. Ask for a special cream to help relieve the burn and protect your skin.   Avoid people who are sick with illnesses and diseases you could catch, such as colds, flu, measles, or chicken pox as well as people who have recently had vaccinations for these illnesses.   Let your healthcare provider know if your throat is sore. You may have an infection that needs treatment.   Remember, many patients feel sick and lose their appetites during treatment. Eat small meals several times a day to keep your strength up:   Choose bland foods with little taste or smell if you are reacting strongly to food.   Be sure to cook all food thoroughly. This kills bacteria and helps you avoid infection.   Eat foods that are soft. Soft foods are less likely to cause stomach irritation.   Try to eat a variety of foods for a well-balanced diet. Drink plenty of fluids and eat foods with fiber to avoid constipation.  When to call your healthcare  provider  Call your healthcare provider right away if you have any of the following:   Unexplained bleeding   Trouble concentrating   Ongoing fatigue   Shortness of breath, wheezing, trouble breathing, or bad cough   Rapid, irregular heartbeat, or chest pain   Dizziness, lightheadedness   Constant feeling of being cold   Hives oracut or rash that swells, turns red, feels hot or painful, or begins to ooze   Burning when you urinate   Feverof100.70F (38C) orhigher, oras directed by your healthcare provider   Date Last Reviewed:  10/15/2014   2000-2017 [employer]. [address] [place] [address]. All rights reserved. This information is not intended as a substitute for professional medical care. Always follow your healthcare professional's instructions.

## 2017-01-30 ENCOUNTER — Ambulatory Visit: Payer: BC Managed Care – PPO | Attending: Hematology & Oncology

## 2017-01-30 DIAGNOSIS — C3490 Malignant neoplasm of unspecified part of unspecified bronchus or lung: Secondary | ICD-10-CM | POA: Insufficient documentation

## 2017-01-30 LAB — CBC WITH DIFF
BASOPHIL #: 0 x10ˆ3/uL (ref 0.00–0.10)
BASOPHIL %: 1 % (ref 0–3)
EOSINOPHIL #: 0 x10ˆ3/uL (ref 0.00–0.50)
EOSINOPHIL %: 0 % (ref 0–5)
HCT: 34.7 % — ABNORMAL LOW (ref 36.0–45.0)
HGB: 12.1 g/dL (ref 12.0–15.5)
LYMPHOCYTE #: 0.1 x10ˆ3/uL — ABNORMAL LOW (ref 1.00–4.80)
LYMPHOCYTE %: 9 % — ABNORMAL LOW (ref 15–43)
MCH: 30.3 pg (ref 27.5–33.2)
MCHC: 34.8 g/dL (ref 32.0–36.0)
MCV: 87.1 fL (ref 82.0–97.0)
MONOCYTE #: 0.2 x10?3/uL (ref 0.20–0.90)
MONOCYTE #: 0.2 x10ˆ3/uL (ref 0.20–0.90)
MONOCYTE %: 18 % — ABNORMAL HIGH (ref 5–12)
MPV: 8.2 fL (ref 7.4–10.5)
NEUTROPHIL #: 1 x10ˆ3/uL — ABNORMAL LOW (ref 1.50–6.50)
NEUTROPHIL %: 73 % (ref 43–76)
NEUTROPHIL %: 73 % (ref 43–76)
PLATELETS: 241 x10ˆ3/uL (ref 150–450)
RBC: 3.99 10*6/uL — ABNORMAL LOW (ref 4.00–5.10)
RDW: 15.8 % (ref 11.0–16.0)
RDW: 15.8 % (ref 11.0–16.0)
WBC: 1.4 x10ˆ3/uL — CL (ref 4.0–11.0)

## 2017-01-30 LAB — COMPREHENSIVE METABOLIC PROFILE - BMC/JMC ONLY
ALBUMIN/GLOBULIN RATIO: 1.3 (ref 0.8–2.0)
ALBUMIN: 3.1 g/dL — ABNORMAL LOW (ref 3.5–5.0)
ALKALINE PHOSPHATASE: 58 U/L (ref 38–126)
ALT (SGPT): 20 U/L (ref 14–54)
ANION GAP: 7 mmol/L (ref 3–11)
AST (SGOT): 27 U/L (ref 15–41)
BILIRUBIN TOTAL: 0.1 mg/dL — ABNORMAL LOW (ref 0.3–1.2)
BUN/CREA RATIO: 9 (ref 6–22)
BUN: 6 mg/dL (ref 6–20)
CALCIUM: 8.7 mg/dL — ABNORMAL LOW (ref 8.8–10.2)
CHLORIDE: 100 mmol/L — ABNORMAL LOW (ref 101–111)
CO2 TOTAL: 25 mmol/L (ref 22–32)
CREATININE: 0.64 mg/dL (ref 0.44–1.00)
ESTIMATED GFR: >60 mL/min/1.73mˆ2 (ref >60)
GLUCOSE: 130 mg/dL — ABNORMAL HIGH (ref 70–110)
POTASSIUM: 3.5 mmol/L (ref 3.4–5.1)
PROTEIN TOTAL: 5.5 g/dL — ABNORMAL LOW (ref 6.4–8.3)
SODIUM: 132 mmol/L — ABNORMAL LOW (ref 136–145)

## 2017-01-30 LAB — MAGNESIUM: MAGNESIUM: 1.9 mg/dL (ref 1.4–2.1)

## 2017-02-02 ENCOUNTER — Encounter (HOSPITAL_BASED_OUTPATIENT_CLINIC_OR_DEPARTMENT_OTHER): Payer: Self-pay | Admitting: Hematology & Oncology

## 2017-02-02 ENCOUNTER — Inpatient Hospital Stay
Admission: RE | Admit: 2017-02-02 | Discharge: 2017-02-02 | Disposition: A | Discharge status: Still In Hospital | Payer: BC Managed Care – PPO | Source: Ambulatory Visit | Attending: Hematology & Oncology | Admitting: Hematology & Oncology

## 2017-02-02 DIAGNOSIS — C3490 Malignant neoplasm of unspecified part of unspecified bronchus or lung: Secondary | ICD-10-CM

## 2017-02-02 DIAGNOSIS — D708 Other neutropenia: Secondary | ICD-10-CM

## 2017-02-02 DIAGNOSIS — D61811 Other drug-induced pancytopenia: Secondary | ICD-10-CM

## 2017-02-02 LAB — CBC WITH DIFF
BASOPHIL #: 0 x10ˆ3/uL (ref 0.00–0.10)
BASOPHIL %: 0 % (ref 0–3)
EOSINOPHIL #: 0.03 x10ˆ3/uL (ref 0.00–0.50)
EOSINOPHIL %: 2 % (ref 0–5)
HCT: 36.1 % (ref 36.0–45.0)
HGB: 12.1 g/dL (ref 12.0–15.5)
LYMPHOCYTE #: 0.2 10*3/uL — ABNORMAL LOW (ref 1.00–4.80)
LYMPHOCYTE %: 13 % — ABNORMAL LOW (ref 15–43)
MCH: 29.7 pg (ref 27.5–33.2)
MCHC: 33.6 g/dL (ref 32.0–36.0)
MCV: 88.4 fL (ref 82.0–97.0)
MONOCYTE #: 0.05 x10ˆ3/uL — ABNORMAL LOW (ref 0.20–0.90)
MONOCYTE %: 3 % — ABNORMAL LOW (ref 5–12)
MPV: 7.7 fL (ref 7.4–10.5)
NEUTROPHIL #: 1.29 10*3/uL — ABNORMAL LOW (ref 1.50–6.50)
NEUTROPHIL %: 82 % — ABNORMAL HIGH (ref 43–76)
NEUTROPHIL %: 82 % — ABNORMAL HIGH (ref 43–76)
PLATELETS: 258 x10ˆ3/uL (ref 150–450)
RBC: 4.08 x10ˆ6/uL (ref 4.00–5.10)
RDW: 15 % (ref 11.0–16.0)
WBC: 1.6 x10ˆ3/uL — CL (ref 4.0–11.0)

## 2017-02-02 MED ORDER — DIPHENHYDRAMINE 50 MG/ML INJECTION SOLUTION
50.00 mg | Freq: Once | INTRAMUSCULAR | Status: AC
Start: 2017-02-02 — End: 2017-02-02
  Administered 2017-02-02: 50 mg via INTRAVENOUS
  Filled 2017-02-02: qty 1

## 2017-02-02 MED ORDER — HEPARIN, PORCINE (PF) 100 UNIT/ML INTRAVENOUS SYRINGE
5.00 mL | INJECTION | Freq: Once | INTRAVENOUS | Status: AC
Start: 2017-02-02 — End: 2017-02-02
  Administered 2017-02-02: 5 mL
  Filled 2017-02-02: qty 5

## 2017-02-02 MED ORDER — SODIUM CHLORIDE 0.9 % INTRAVENOUS SOLUTION
40.00 mg/m2 | Freq: Once | INTRAVENOUS | Status: AC
Start: 2017-02-02 — End: 2017-02-02
  Administered 2017-02-02: 57 mg via INTRAVENOUS
  Administered 2017-02-02: 0 mg via INTRAVENOUS
  Filled 2017-02-02: qty 9.5

## 2017-02-02 MED ORDER — FAMOTIDINE (PF) 20 MG/50 ML IN 0.9 % NACL (ISO) INTRAVENOUS PIGGYBACK
20.00 mg | INJECTION | Freq: Once | INTRAVENOUS | Status: AC
Start: 2017-02-02 — End: 2017-02-02
  Administered 2017-02-02: 50 mL via INTRAVENOUS
  Administered 2017-02-02: 0 mL via INTRAVENOUS
  Filled 2017-02-02: qty 50

## 2017-02-02 MED ORDER — ONDANSETRON HCL 2 MG/ML INTRAVENOUS SOLUTION
Freq: Once | INTRAVENOUS | Status: AC
Start: 2017-02-02 — End: 2017-02-02
  Filled 2017-02-02: qty 4

## 2017-02-02 MED ORDER — SODIUM CHLORIDE 0.9 % (FLUSH) INJECTION SYRINGE
10.00 mL | INJECTION | Freq: Once | INTRAMUSCULAR | Status: AC
Start: 2017-02-02 — End: 2017-02-02
  Administered 2017-02-02: 10 mL

## 2017-02-02 MED ORDER — SODIUM CHLORIDE 0.9 % INTRAVENOUS SOLUTION
141.15 mg | Freq: Once | INTRAVENOUS | Status: AC
Start: 2017-02-02 — End: 2017-02-02
  Administered 2017-02-02: 140 mg via INTRAVENOUS
  Administered 2017-02-02: 0 mg via INTRAVENOUS
  Filled 2017-02-02: qty 14

## 2017-02-02 NOTE — Nursing Note (Signed)
Received order for supportive care.Hulen Sk[employer] @ [phone] spoke with Valente David call ref # [id number] who confirmed [id number] (zarxio) does not require authorization. Informed Dawn B ok to schedule

## 2017-02-02 NOTE — Nurses Notes (Signed)
Patient discharged home with family.  AVS reviewed with patient/care giver.  A written copy of the AVS and discharge instructions was given to the patient/care giver.  Questions sufficiently answered as needed.  Patient/care giver encouraged to follow up with PCP as indicated.  In the event of an emergency, patient/care giver instructed to call 911 or go to the nearest emergency room.

## 2017-02-02 NOTE — Discharge Instructions (Signed)
Discharge Instructions for Chemotherapy  Your healthcare provider prescribed a type of medicine therapy for you called chemotherapy. Healthcare providers prescribe chemotherapy for many different types of illnesses, including cancer. There are many types of chemotherapy. This sheet provides general guidelines on how you can take care ofyourself after your chemotherapy.  Mouth care  Don't be discouraged if you get mouth sores, even if you are following all your healthcare provider's instructions. Many people get mouth sores as a side effect of chemotherapy. Here's what you can do to prevent mouth sores:   Keep your mouth clean. Brush your teeth with a soft-bristle toothbrush after every meal.   Ask if you should use a toothpaste with fluoride, or a mixture of 1 teaspoon of salt in 8-ounces of water to brush your teeth.   Use an oral swab or special soft toothbrush if your gums bleed during regular brushing.   Don't use dental floss if it causes your gums to bleed.   Use any mouthwashes given to you as directed.   If you can't tolerate regular methods, use salt and baking soda to clean your mouth. Mix1teaspoonof salt and1teaspoon of baking soda in 1 quart of warm water. Swish and spit.   If you wear dentures, you may be told to wear them only when you eat, ask your healthcare provider. Clean dentures twice a day and soak in antimicrobial solution when you aren't wearing them. Rinse your mouth after each meal.   Watch your mouth and tonguefor white patches. Thismay bea sign of a type of yeast infection (thrush), a common side effect of chemotherapy. Be sure to tell your healthcare provider about these patches. Medicine can be prescribed to treat it.  Other home care  Here's what else you can do:   Try to exercise. Exercise keeps you strong and keeps your heart and lungs active. Walking and yoga are good types of exercise.   Keep clean. During chemotherapy, your body can't fight infection very  well. Take short baths or showers.   Wash your hands before you eat and after going to the bathroom.   Use moisturizing soap. Chemotherapy can make your skin dry.   Apply moisturizing lotion several times a day to help relieve dry skin.   Don't take very hot or very cold showers or baths.   Don't be surprised if your chemotherapy causes slight burns to your skin--usually on the hands and feet. Some medicines used in high doses cause this to happen. Ask for a special cream to help relieve the burn and protect your skin.   Avoid people who are sick with illnesses and diseases you could catch, such as colds, flu, measles, or chicken pox as well as people who have recently had vaccinations for these illnesses.   Let your healthcare provider know if your throat is sore. You may have an infection that needs treatment.   Remember, many patients feel sick and lose their appetites during treatment. Eat small meals several times a day to keep your strength up:   Choose bland foods with little taste or smell if you are reacting strongly to food.   Be sure to cook all food thoroughly. This kills bacteria and helps you avoid infection.   Eat foods that are soft. Soft foods are less likely to cause stomach irritation.   Try to eat a variety of foods for a well-balanced diet. Drink plenty of fluids and eat foods with fiber to avoid constipation.  When to call your healthcare  provider  Call your healthcare provider right away if you have any of the following:   Unexplained bleeding   Trouble concentrating   Ongoing fatigue   Shortness of breath, wheezing, trouble breathing, or bad cough   Rapid, irregular heartbeat, or chest pain   Dizziness, lightheadedness   Constant feeling of being cold   Hives oracut or rash that swells, turns red, feels hot or painful, or begins to ooze   Burning when you urinate   Feverof100.26F (38C) orhigher, oras directed by your healthcare provider   Date Last Reviewed:  10/15/2014   2000-2017 [employer]. [address], [place] [address]. All rights reserved. This information is not intended as a substitute for professional medical care. Always follow your healthcare professional's instructions.

## 2017-02-03 ENCOUNTER — Inpatient Hospital Stay
Admission: RE | Admit: 2017-02-03 | Discharge: 2017-02-03 | Disposition: A | Discharge status: Still In Hospital | Payer: BC Managed Care – PPO | Source: Ambulatory Visit | Attending: Hematology & Oncology | Admitting: Hematology & Oncology

## 2017-02-03 DIAGNOSIS — D61811 Other drug-induced pancytopenia: Secondary | ICD-10-CM

## 2017-02-03 DIAGNOSIS — D708 Other neutropenia: Secondary | ICD-10-CM

## 2017-02-03 MED ORDER — FILGRASTIM-SNDZ 300 MCG/0.5 ML INJECTION SYRINGE
300.0000 ug | INJECTION | Freq: Once | INTRAMUSCULAR | Status: AC
Start: 2017-02-03 — End: 2017-02-03
  Administered 2017-02-03: 300 ug via SUBCUTANEOUS
  Filled 2017-02-03: qty 0.5

## 2017-02-03 MED ADMIN — filgrastim-sndz 300 mcg/0.5 mL injection syringe: @ 15:00:00

## 2017-02-03 NOTE — Discharge Instructions (Signed)
Filgrastim Solution for injection  What is this medicine?  FILGRASTIM, G-CSF (fil GRA stim) is a granulocyte colony-stimulating factor that stimulates the growth of neutrophils, a type of white blood cell (WBC) important in the body's fight against infection. It is used to reduce the incidence of fever and infection in patients with certain types of cancer who are receiving chemotherapy that affects the bone marrow, to stimulate blood cell production for removal of WBCs from the body prior to a bone marrow transplantation, to reduce the incidence of fever and infection in patients who have severe chronic neutropenia, and to improve survival outcomes following high-dose radiation exposure that is toxic to the bone marrow.  This medicine may be used for other purposes; ask your health care provider or pharmacist if you have questions.  What should I tell my health care provider before I take this medicine?  They need to know if you have any of these conditions:   kidney disease   latex allergy   ongoing radiation therapy   sickle cell disease   an unusual or allergic reaction to filgrastim, pegfilgrastim, other medicines, foods, dyes, or preservatives   pregnant or trying to get pregnant   breast-feeding  How should I use this medicine?  This medicine is for injection under the skin or infusion into a vein. As an infusion into a vein, it is usually given by a health care professional in a hospital or clinic setting. If you get this medicine at home, you will be taught how to prepare and give this medicine. Refer to the Instructions for Use that come with your medication packaging. Use exactly as directed. Take your medicine at regular intervals. Do not take your medicine more often than directed.  It is important that you put your used needles and syringes in a special sharps container. Do not put them in a trash can. If you do not have a sharps container, call your pharmacist or healthcare provider to get  one.  Talk to your pediatrician regarding the use of this medicine in children. While this drug may be prescribed for children as young as 7 months for selected conditions, precautions do apply.  Overdosage: If you think you have taken too much of this medicine contact a poison control center or emergency room at once.  NOTE: This medicine is only for you. Do not share this medicine with others.  What if I miss a dose?  It is important not to miss your dose. Call your doctor or health care professional if you miss a dose.  What may interact with this medicine?  This medicine may interact with the following medications:   medicines that may cause a release of neutrophils, such as lithium  This list may not describe all possible interactions. Give your health care provider a list of all the medicines, herbs, non-prescription drugs, or dietary supplements you use. Also tell them if you smoke, drink alcohol, or use illegal drugs. Some items may interact with your medicine.  What should I watch for while using this medicine?  You may need blood work done while you are taking this medicine.  What side effects may I notice from receiving this medicine?  Side effects that you should report to your doctor or health care professional as soon as possible:   allergic reactions like skin rash, itching or hives, swelling of the face, lips, or tongue   dizziness or feeling faint   fever   pain, redness, or irritation at  site where injected   pinpoint red spots on the skin   shortness of breath or breathing problems   signs and symptoms of kidney injury like trouble passing urine, change in the amount of urine, or red or dark-brown urine   stomach or side pain, or pain at the shoulder   swelling   tiredness   unusual bleeding or bruising  Side effects that usually do not require medical attention (report to your doctor or health care professional if they continue or are bothersome):   bone pain   headache   muscle  pain  This list may not describe all possible side effects. Call your doctor for medical advice about side effects. You may report side effects to FDA at 1-800-FDA-1088.  Where should I keep my medicine?  Keep out of the reach of children.  Store in a refrigerator between 2 and 8 degrees C (36 and 46 degrees F). Do not freeze. Keep in carton to protect from light. Throw away this medicine if vials or syringes are left out of the refrigerator for more than 24 hours. Throw away any unused medicine after the expiration date.  NOTE: This sheet is a summary. It may not cover all possible information. If you have questions about this medicine, talk to your doctor, pharmacist, or health care provider.  NOTE:This sheet is a summary. It may not cover all possible information. If you have questions about this medicine, talk to your doctor, pharmacist, or health care provider. Copyright 2018 [employer]

## 2017-02-04 ENCOUNTER — Inpatient Hospital Stay
Admission: RE | Admit: 2017-02-04 | Discharge: 2017-02-04 | Disposition: A | Discharge status: Still In Hospital | Payer: BC Managed Care – PPO | Source: Ambulatory Visit | Attending: Hematology & Oncology | Admitting: Hematology & Oncology

## 2017-02-04 DIAGNOSIS — D61811 Other drug-induced pancytopenia: Secondary | ICD-10-CM

## 2017-02-04 DIAGNOSIS — D708 Other neutropenia: Secondary | ICD-10-CM

## 2017-02-04 MED ORDER — FILGRASTIM-SNDZ 300 MCG/0.5 ML INJECTION SYRINGE
300.0000 ug | INJECTION | Freq: Once | INTRAMUSCULAR | Status: AC
Start: 2017-02-04 — End: 2017-02-04
  Administered 2017-02-04: 15:00:00 300 ug via SUBCUTANEOUS
  Filled 2017-02-04: qty 0.5

## 2017-02-04 MED ADMIN — sodium chloride 0.9 % (flush) injection syringe: SUBCUTANEOUS | @ 15:00:00

## 2017-02-04 NOTE — Discharge Instructions (Signed)
Filgrastim Solution for injection  What is this medicine?  FILGRASTIM, G-CSF (fil GRA stim) is a granulocyte colony-stimulating factor that stimulates the growth of neutrophils, a type of white blood cell (WBC) important in the body's fight against infection. It is used to reduce the incidence of fever and infection in patients with certain types of cancer who are receiving chemotherapy that affects the bone marrow, to stimulate blood cell production for removal of WBCs from the body prior to a bone marrow transplantation, to reduce the incidence of fever and infection in patients who have severe chronic neutropenia, and to improve survival outcomes following high-dose radiation exposure that is toxic to the bone marrow.  This medicine may be used for other purposes; ask your health care provider or pharmacist if you have questions.  What should I tell my health care provider before I take this medicine?  They need to know if you have any of these conditions:   kidney disease   latex allergy   ongoing radiation therapy   sickle cell disease   an unusual or allergic reaction to filgrastim, pegfilgrastim, other medicines, foods, dyes, or preservatives   pregnant or trying to get pregnant   breast-feeding  How should I use this medicine?  This medicine is for injection under the skin or infusion into a vein. As an infusion into a vein, it is usually given by a health care professional in a hospital or clinic setting. If you get this medicine at home, you will be taught how to prepare and give this medicine. Refer to the Instructions for Use that come with your medication packaging. Use exactly as directed. Take your medicine at regular intervals. Do not take your medicine more often than directed.  It is important that you put your used needles and syringes in a special sharps container. Do not put them in a trash can. If you do not have a sharps container, call your pharmacist or healthcare provider to get  one.  Talk to your pediatrician regarding the use of this medicine in children. While this drug may be prescribed for children as young as 7 months for selected conditions, precautions do apply.  Overdosage: If you think you have taken too much of this medicine contact a poison control center or emergency room at once.  NOTE: This medicine is only for you. Do not share this medicine with others.  What if I miss a dose?  It is important not to miss your dose. Call your doctor or health care professional if you miss a dose.  What may interact with this medicine?  This medicine may interact with the following medications:   medicines that may cause a release of neutrophils, such as lithium  This list may not describe all possible interactions. Give your health care provider a list of all the medicines, herbs, non-prescription drugs, or dietary supplements you use. Also tell them if you smoke, drink alcohol, or use illegal drugs. Some items may interact with your medicine.  What should I watch for while using this medicine?  You may need blood work done while you are taking this medicine.  What side effects may I notice from receiving this medicine?  Side effects that you should report to your doctor or health care professional as soon as possible:   allergic reactions like skin rash, itching or hives, swelling of the face, lips, or tongue   dizziness or feeling faint   fever   pain, redness, or irritation at  site where injected   pinpoint red spots on the skin   shortness of breath or breathing problems   signs and symptoms of kidney injury like trouble passing urine, change in the amount of urine, or red or dark-brown urine   stomach or side pain, or pain at the shoulder   swelling   tiredness   unusual bleeding or bruising  Side effects that usually do not require medical attention (report to your doctor or health care professional if they continue or are bothersome):   bone pain   headache   muscle  pain  This list may not describe all possible side effects. Call your doctor for medical advice about side effects. You may report side effects to FDA at 1-800-FDA-1088.  Where should I keep my medicine?  Keep out of the reach of children.  Store in a refrigerator between 2 and 8 degrees C (36 and 46 degrees F). Do not freeze. Keep in carton to protect from light. Throw away this medicine if vials or syringes are left out of the refrigerator for more than 24 hours. Throw away any unused medicine after the expiration date.  NOTE: This sheet is a summary. It may not cover all possible information. If you have questions about this medicine, talk to your doctor, pharmacist, or health care provider.  NOTE:This sheet is a summary. It may not cover all possible information. If you have questions about this medicine, talk to your doctor, pharmacist, or health care provider. Copyright 2018 [employer]

## 2017-02-05 ENCOUNTER — Inpatient Hospital Stay (HOSPITAL_BASED_OUTPATIENT_CLINIC_OR_DEPARTMENT_OTHER)
Admission: RE | Admit: 2017-02-05 | Discharge: 2017-02-05 | Disposition: A | Discharge status: Still In Hospital | Payer: BC Managed Care – PPO | Source: Ambulatory Visit | Attending: Hematology & Oncology | Admitting: Hematology & Oncology

## 2017-02-05 DIAGNOSIS — D708 Other neutropenia: Secondary | ICD-10-CM

## 2017-02-05 DIAGNOSIS — D61811 Other drug-induced pancytopenia: Secondary | ICD-10-CM

## 2017-02-05 MED ORDER — FILGRASTIM-SNDZ 300 MCG/0.5 ML INJECTION SYRINGE
300.0000 ug | INJECTION | Freq: Once | INTRAMUSCULAR | Status: AC
Start: 2017-02-05 — End: 2017-02-05
  Administered 2017-02-05: 300 ug via SUBCUTANEOUS
  Filled 2017-02-05: qty 0.5

## 2017-02-06 ENCOUNTER — Ambulatory Visit: Payer: BC Managed Care – PPO | Attending: Hematology & Oncology

## 2017-02-06 DIAGNOSIS — C3490 Malignant neoplasm of unspecified part of unspecified bronchus or lung: Secondary | ICD-10-CM | POA: Insufficient documentation

## 2017-02-06 LAB — COMPREHENSIVE METABOLIC PROFILE - BMC/JMC ONLY
ALBUMIN/GLOBULIN RATIO: 1.4 (ref 0.8–2.0)
ALBUMIN/GLOBULIN RATIO: 1.4 (ref 0.8–2.0)
ALBUMIN: 3.3 g/dL — ABNORMAL LOW (ref 3.5–5.0)
ALKALINE PHOSPHATASE: 77 U/L (ref 38–126)
ALT (SGPT): 22 U/L (ref 14–54)
ANION GAP: 6 mmol/L (ref 3–11)
AST (SGOT): 29 U/L (ref 15–41)
BILIRUBIN TOTAL: 0.3 mg/dL (ref 0.3–1.2)
BUN/CREA RATIO: 9 (ref 6–22)
BUN/CREA RATIO: 9 (ref 6–22)
BUN: 7 mg/dL (ref 6–20)
CALCIUM: 8.7 mg/dL — ABNORMAL LOW (ref 8.8–10.2)
CHLORIDE: 102 mmol/L (ref 101–111)
CO2 TOTAL: 28 mmol/L (ref 22–32)
CREATININE: 0.76 mg/dL (ref 0.44–1.00)
ESTIMATED GFR: >60 mL/min/1.73mˆ2 (ref >60)
GLUCOSE: 129 mg/dL — ABNORMAL HIGH (ref 70–110)
POTASSIUM: 3.4 mmol/L (ref 3.4–5.1)
PROTEIN TOTAL: 5.7 g/dL — ABNORMAL LOW (ref 6.4–8.3)
SODIUM: 136 mmol/L (ref 136–145)

## 2017-02-06 LAB — CBC WITH DIFF
HCT: 34.9 % — ABNORMAL LOW (ref 36.0–45.0)
HGB: 12.1 g/dL (ref 12.0–15.5)
MCH: 30.4 pg (ref 27.5–33.2)
MCHC: 34.7 g/dL (ref 32.0–36.0)
MCV: 87.7 fL (ref 82.0–97.0)
MPV: 9 fL (ref 7.4–10.5)
PLATELETS: 207 x10ˆ3/uL (ref 150–450)
RBC: 3.97 x10ˆ6/uL — ABNORMAL LOW (ref 4.00–5.10)
RDW: 16.9 % — ABNORMAL HIGH (ref 11.0–16.0)
WBC: 7.4 x10ˆ3/uL (ref 4.0–11.0)

## 2017-02-06 LAB — MANUAL DIFFERENTIAL
BAND %: 19 % — ABNORMAL HIGH (ref 0–4)
LYMPHOCYTE %: 4 % — ABNORMAL LOW (ref 15–43)
LYMPHOCYTE ABSOLUTE: 0.3 x10ˆ3/uL — ABNORMAL LOW (ref 1.00–4.80)
METAMYELOCYTE %: 1 % (ref 0–1)
METAMYELOCYTE ABSOLUTE: 0.04 x10ˆ3/uL
MONOCYTE %: 2 % — ABNORMAL LOW (ref 5–12)
MONOCYTE ABSOLUTE: 0.15 x10ˆ3/uL — ABNORMAL LOW (ref 0.20–0.90)
MYELOCYTE %: 1 % — ABNORMAL HIGH (ref 0–0)
MYELOCYTE ABSOLUTE: 0.04 x10ˆ3/uL
NEUTROPHIL %: 73 % (ref 43–76)
NEUTROPHIL ABSOLUTE: 6.81 x10ˆ3/uL — ABNORMAL HIGH (ref 1.50–6.50)
PLATELET ESTIMATE: ADEQUATE
WBC MORPHOLOGY COMMENT: NORMAL
WBC: 7.4 x10ˆ3/uL

## 2017-02-06 LAB — MAGNESIUM: MAGNESIUM: 1.8 mg/dL (ref 1.4–2.1)

## 2017-02-09 ENCOUNTER — Inpatient Hospital Stay
Admission: RE | Admit: 2017-02-09 | Discharge: 2017-02-09 | Disposition: A | Discharge status: Still In Hospital | Payer: BC Managed Care – PPO | Source: Ambulatory Visit | Attending: Hematology & Oncology | Admitting: Hematology & Oncology

## 2017-02-09 DIAGNOSIS — D708 Other neutropenia: Secondary | ICD-10-CM | POA: Insufficient documentation

## 2017-02-09 DIAGNOSIS — D61811 Other drug-induced pancytopenia: Secondary | ICD-10-CM | POA: Insufficient documentation

## 2017-02-09 DIAGNOSIS — C3490 Malignant neoplasm of unspecified part of unspecified bronchus or lung: Secondary | ICD-10-CM

## 2017-02-09 DIAGNOSIS — Z5111 Encounter for antineoplastic chemotherapy: Secondary | ICD-10-CM | POA: Insufficient documentation

## 2017-02-09 MED ORDER — SODIUM CHLORIDE 0.9 % INTRAVENOUS SOLUTION
INTRAVENOUS | Status: DC
Start: 2017-02-09 — End: 2017-02-10

## 2017-02-09 MED ORDER — SODIUM CHLORIDE 0.9 % INTRAVENOUS SOLUTION
40.0000 mg/m2 | Freq: Once | INTRAVENOUS | Status: AC
Start: 2017-02-09 — End: 2017-02-09
  Administered 2017-02-09: 57 mg via INTRAVENOUS
  Administered 2017-02-09: 0 mg via INTRAVENOUS
  Filled 2017-02-09: qty 9.5

## 2017-02-09 MED ORDER — ONDANSETRON HCL 2 MG/ML INTRAVENOUS SOLUTION
Freq: Once | INTRAVENOUS | Status: AC
Start: 2017-02-09 — End: 2017-02-09
  Filled 2017-02-09: qty 4

## 2017-02-09 MED ORDER — SODIUM CHLORIDE 0.9 % INTRAVENOUS SOLUTION
132.9000 mg | Freq: Once | INTRAVENOUS | Status: AC
Start: 2017-02-09 — End: 2017-02-09
  Administered 2017-02-09: 135 mg via INTRAVENOUS
  Administered 2017-02-09: 0 mg via INTRAVENOUS
  Filled 2017-02-09: qty 13.5

## 2017-02-09 MED ORDER — HEPARIN, PORCINE (PF) 100 UNIT/ML INTRAVENOUS SYRINGE
5.0000 mL | INJECTION | Freq: Once | INTRAVENOUS | Status: AC
Start: 2017-02-09 — End: 2017-02-09
  Administered 2017-02-09: 5 mL
  Filled 2017-02-09: qty 5

## 2017-02-09 MED ORDER — DIPHENHYDRAMINE 50 MG/ML INJECTION SOLUTION
50.0000 mg | Freq: Once | INTRAMUSCULAR | Status: AC
Start: 2017-02-09 — End: 2017-02-09
  Administered 2017-02-09: 50 mg via INTRAVENOUS
  Filled 2017-02-09: qty 1

## 2017-02-09 MED ORDER — SODIUM CHLORIDE 0.9 % (FLUSH) INJECTION SYRINGE
10.0000 mL | INJECTION | Freq: Once | INTRAMUSCULAR | Status: AC
Start: 2017-02-09 — End: 2017-02-09
  Administered 2017-02-09: 10 mL

## 2017-02-09 MED ORDER — FAMOTIDINE (PF) 20 MG/50 ML IN 0.9 % NACL (ISO) INTRAVENOUS PIGGYBACK
20.0000 mg | INJECTION | Freq: Once | INTRAVENOUS | Status: AC
Start: 2017-02-09 — End: 2017-02-09
  Administered 2017-02-09: 50 mL via INTRAVENOUS
  Administered 2017-02-09: 0 mL via INTRAVENOUS
  Filled 2017-02-09: qty 50

## 2017-02-09 NOTE — Discharge Instructions (Signed)
Discharge Instructions for Chemotherapy  Your healthcare provider prescribed a type of medicine therapy for you called chemotherapy. Healthcare providers prescribe chemotherapy for many different types of illnesses, including cancer. There are many types of chemotherapy. This sheet provides general guidelines on how you can take care ofyourself after your chemotherapy.  Mouth care  Don't be discouraged if you get mouth sores, even if you are following all your healthcare provider's instructions. Many people get mouth sores as a side effect of chemotherapy. Here's what you can do to prevent mouth sores:   Keep your mouth clean. Brush your teeth with a soft-bristle toothbrush after every meal.   Ask if you should use a toothpaste with fluoride, or a mixture of 1 teaspoon of salt in 8-ounces of water to brush your teeth.   Use an oral swab or special soft toothbrush if your gums bleed during regular brushing.   Don't use dental floss if it causes your gums to bleed.   Use any mouthwashes given to you as directed.   If you can't tolerate regular methods, use salt and baking soda to clean your mouth. Mix1teaspoonof salt and1teaspoon of baking soda in 1 quart of warm water. Swish and spit.   If you wear dentures, you may be told to wear them only when you eat, ask your healthcare provider. Clean dentures twice a day and soak in antimicrobial solution when you aren't wearing them. Rinse your mouth after each meal.   Watch your mouth and tonguefor white patches. Thismay bea sign of a type of yeast infection (thrush), a common side effect of chemotherapy. Be sure to tell your healthcare provider about these patches. Medicine can be prescribed to treat it.  Other home care  Here's what else you can do:   Try to exercise. Exercise keeps you strong and keeps your heart and lungs active. Walking and yoga are good types of exercise.   Keep clean. During chemotherapy, your body can't fight infection very  well. Take short baths or showers.   Wash your hands before you eat and after going to the bathroom.   Use moisturizing soap. Chemotherapy can make your skin dry.   Apply moisturizing lotion several times a day to help relieve dry skin.   Don't take very hot or very cold showers or baths.   Don't be surprised if your chemotherapy causes slight burns to your skin--usually on the hands and feet. Some medicines used in high doses cause this to happen. Ask for a special cream to help relieve the burn and protect your skin.   Avoid people who are sick with illnesses and diseases you could catch, such as colds, flu, measles, or chicken pox as well as people who have recently had vaccinations for these illnesses.   Let your healthcare provider know if your throat is sore. You may have an infection that needs treatment.   Remember, many patients feel sick and lose their appetites during treatment. Eat small meals several times a day to keep your strength up:   Choose bland foods with little taste or smell if you are reacting strongly to food.   Be sure to cook all food thoroughly. This kills bacteria and helps you avoid infection.   Eat foods that are soft. Soft foods are less likely to cause stomach irritation.   Try to eat a variety of foods for a well-balanced diet. Drink plenty of fluids and eat foods with fiber to avoid constipation.  When to call your healthcare  provider  Call your healthcare provider right away if you have any of the following:   Unexplained bleeding   Trouble concentrating   Ongoing fatigue   Shortness of breath, wheezing, trouble breathing, or bad cough   Rapid, irregular heartbeat, or chest pain   Dizziness, lightheadedness   Constant feeling of being cold   Hives oracut or rash that swells, turns red, feels hot or painful, or begins to ooze   Burning when you urinate   Feverof100.62F (38C) orhigher, oras directed by your healthcare provider   Date Last Reviewed:  10/15/2014   2000-2017 [employer]. [address] [place] [address]. All rights reserved. This information is not intended as a substitute for professional medical care. Always follow your healthcare professional's instructions.

## 2017-02-09 NOTE — Nurses Notes (Signed)
Patient discharged home with family.  AVS reviewed with patient/care giver.  A written copy of the AVS and discharge instructions was given to the patient/care giver.  Questions sufficiently answered as needed.  Patient/care giver encouraged to follow up with PCP as indicated.  In the event of an emergency, patient/care giver instructed to call 911 or go to the nearest emergency room.

## 2017-02-10 ENCOUNTER — Inpatient Hospital Stay
Admission: RE | Admit: 2017-02-10 | Discharge: 2017-02-10 | Disposition: A | Discharge status: Still In Hospital | Payer: BC Managed Care – PPO | Source: Ambulatory Visit | Attending: Hematology & Oncology | Admitting: Hematology & Oncology

## 2017-02-10 DIAGNOSIS — D708 Other neutropenia: Secondary | ICD-10-CM

## 2017-02-10 DIAGNOSIS — D61811 Other drug-induced pancytopenia: Secondary | ICD-10-CM

## 2017-02-10 MED ORDER — FILGRASTIM-SNDZ 300 MCG/0.5 ML INJECTION SYRINGE
300.00 ug | INJECTION | Freq: Once | INTRAMUSCULAR | Status: AC
Start: 2017-02-10 — End: 2017-02-10
  Administered 2017-02-10: 300 ug via SUBCUTANEOUS
  Filled 2017-02-10: qty 0.5

## 2017-02-11 ENCOUNTER — Inpatient Hospital Stay
Admission: RE | Admit: 2017-02-11 | Discharge: 2017-02-11 | Disposition: A | Discharge status: Still In Hospital | Payer: BC Managed Care – PPO | Source: Ambulatory Visit | Attending: Hematology & Oncology | Admitting: Hematology & Oncology

## 2017-02-11 DIAGNOSIS — D708 Other neutropenia: Secondary | ICD-10-CM

## 2017-02-11 DIAGNOSIS — D61811 Other drug-induced pancytopenia: Secondary | ICD-10-CM

## 2017-02-11 MED ORDER — FILGRASTIM-SNDZ 300 MCG/0.5 ML INJECTION SYRINGE
300.0000 ug | INJECTION | Freq: Once | INTRAMUSCULAR | Status: AC
Start: 2017-02-11 — End: 2017-02-11
  Administered 2017-02-11: 300 ug via SUBCUTANEOUS
  Filled 2017-02-11: qty 1

## 2017-02-12 ENCOUNTER — Inpatient Hospital Stay
Admission: RE | Admit: 2017-02-12 | Discharge: 2017-02-12 | Disposition: A | Discharge status: Still In Hospital | Payer: BC Managed Care – PPO | Source: Ambulatory Visit | Attending: Hematology & Oncology | Admitting: Hematology & Oncology

## 2017-02-12 DIAGNOSIS — D708 Other neutropenia: Secondary | ICD-10-CM

## 2017-02-12 DIAGNOSIS — D61811 Other drug-induced pancytopenia: Secondary | ICD-10-CM

## 2017-02-12 MED ORDER — FILGRASTIM-SNDZ 300 MCG/0.5 ML INJECTION SYRINGE
300.0000 ug | INJECTION | Freq: Once | INTRAMUSCULAR | Status: AC
Start: 2017-02-12 — End: 2017-02-12
  Administered 2017-02-12: 300 ug via SUBCUTANEOUS
  Filled 2017-02-12: qty 1

## 2017-02-17 ENCOUNTER — Inpatient Hospital Stay (HOSPITAL_BASED_OUTPATIENT_CLINIC_OR_DEPARTMENT_OTHER): Payer: BC Managed Care – PPO | Attending: Hematology & Oncology | Admitting: Hematology & Oncology

## 2017-02-17 ENCOUNTER — Ambulatory Visit (HOSPITAL_BASED_OUTPATIENT_CLINIC_OR_DEPARTMENT_OTHER)
Admission: RE | Admit: 2017-02-17 | Discharge: 2017-02-17 | Disposition: A | Discharge status: Home / Self Care | Payer: BC Managed Care – PPO | Source: Ambulatory Visit | Attending: Hematology & Oncology | Admitting: Hematology & Oncology

## 2017-02-17 ENCOUNTER — Encounter (HOSPITAL_BASED_OUTPATIENT_CLINIC_OR_DEPARTMENT_OTHER): Payer: Self-pay | Admitting: Hematology & Oncology

## 2017-02-17 VITALS — BP 115/70 | HR 120 | Temp 98.2°F | Resp 14 | Ht 60.98 in | Wt 106.6 lb

## 2017-02-17 DIAGNOSIS — Z79890 Hormone replacement therapy (postmenopausal): Secondary | ICD-10-CM | POA: Insufficient documentation

## 2017-02-17 DIAGNOSIS — E039 Hypothyroidism, unspecified: Secondary | ICD-10-CM | POA: Insufficient documentation

## 2017-02-17 DIAGNOSIS — C3490 Malignant neoplasm of unspecified part of unspecified bronchus or lung: Secondary | ICD-10-CM | POA: Insufficient documentation

## 2017-02-17 DIAGNOSIS — Z923 Personal history of irradiation: Secondary | ICD-10-CM | POA: Insufficient documentation

## 2017-02-17 DIAGNOSIS — Z79899 Other long term (current) drug therapy: Secondary | ICD-10-CM | POA: Insufficient documentation

## 2017-02-17 DIAGNOSIS — Z9221 Personal history of antineoplastic chemotherapy: Secondary | ICD-10-CM | POA: Insufficient documentation

## 2017-02-17 DIAGNOSIS — K219 Gastro-esophageal reflux disease without esophagitis: Secondary | ICD-10-CM

## 2017-02-17 LAB — CBC WITH DIFF
BASOPHIL #: 0.02 x10ˆ3/uL (ref 0.00–0.10)
BASOPHIL %: 1 % (ref 0–3)
EOSINOPHIL #: 0.03 x10ˆ3/uL (ref 0.00–0.50)
EOSINOPHIL %: 1 % (ref 0–5)
HCT: 34.6 % — ABNORMAL LOW (ref 36.0–45.0)
HGB: 11.7 g/dL — ABNORMAL LOW (ref 12.0–15.5)
LYMPHOCYTE #: 0.58 x10ˆ3/uL — ABNORMAL LOW (ref 1.00–4.80)
LYMPHOCYTE %: 21 % (ref 15–43)
MCH: 29.9 pg (ref 27.5–33.2)
MCHC: 33.7 g/dL (ref 32.0–36.0)
MCV: 88.7 fL (ref 82.0–97.0)
MONOCYTE #: 0.66 x10ˆ3/uL (ref 0.20–0.90)
MONOCYTE %: 24 % — ABNORMAL HIGH (ref 5–12)
MPV: 7.9 fL (ref 7.4–10.5)
NEUTROPHIL #: 1.47 x10ˆ3/uL — ABNORMAL LOW (ref 1.50–6.50)
NEUTROPHIL %: 53 % (ref 43–76)
PLATELETS: 150 10*3/uL (ref 150–450)
RBC: 3.9 x10ˆ6/uL — ABNORMAL LOW (ref 4.00–5.10)
RDW: 16.1 % — ABNORMAL HIGH (ref 11.0–16.0)
WBC: 2.8 x10ˆ3/uL — ABNORMAL LOW (ref 4.0–11.0)

## 2017-02-17 MED ORDER — PANTOPRAZOLE 20 MG TABLET,DELAYED RELEASE: 20 mg | Tab | Freq: Every morning | ORAL | 0 refills | 0 days | Status: DC

## 2017-02-25 ENCOUNTER — Other Ambulatory Visit (HOSPITAL_BASED_OUTPATIENT_CLINIC_OR_DEPARTMENT_OTHER): Payer: Self-pay | Admitting: Hematology & Oncology

## 2017-02-25 DIAGNOSIS — R1013 Epigastric pain: Secondary | ICD-10-CM

## 2017-03-03 ENCOUNTER — Ambulatory Visit (INDEPENDENT_AMBULATORY_CARE_PROVIDER_SITE_OTHER): Payer: BC Managed Care – PPO | Admitting: GASTROENTEROLOGY

## 2017-03-03 ENCOUNTER — Encounter (INDEPENDENT_AMBULATORY_CARE_PROVIDER_SITE_OTHER): Payer: Self-pay | Admitting: GASTROENTEROLOGY

## 2017-03-03 DIAGNOSIS — R1013 Epigastric pain: Secondary | ICD-10-CM

## 2017-03-03 DIAGNOSIS — R1310 Dysphagia, unspecified: Secondary | ICD-10-CM

## 2017-03-03 NOTE — H&P (Signed)
[place], [place] [address]  [phone]    Encounter Date: 03/03/2017     Name: Gina Barrett  Age: 61 y.o.  DOB: Sep 09, 1955  Sex: female    Impression:   History of dysphagia mild epigastric pain status post treatment for lung cancer with radiation  Remote history of polyps up-to-date on colonoscopy  Recommendations:   Again the differential would include radiation induced esophagitis in even possibly a stricture I have advised the patient to stay away from meat products other possibilities would include infectious esophagitis versus spasm go ahead and obtain a barium swallow plan on an EGD risks benefits alternatives explained the patient agrees to proceed if any severe symptoms patient is aware to go to the emergency room  Of note as far as possible dilation again given her history of radiation this should be done only at a center with backup for thoracic surgery given high risk of perforation in this clinical scenario     Chief Complaint/HPI:   This is a pleasant 61 year old female in seen about 3 years ago for surveillance colonoscopy the colonoscopy was fine she was not having any symptoms at that time from a GI standpoint and subsequently she was diagnosed with lung cancer had treatment initially had some dysphagia that sort of got better now has a little bit of epigastric burning at times denies any chest pain or shortness of breath no vomiting appetite is fair no lower abdominal symptoms no melena or hematochezia no fevers chills or sweats pruritus jaundice headache dizziness or weakness patient is a former smoker  Risk analyst Complaint   Patient presents with    Epigastric Pain     epig pain post chemo REFERRAL [place]        History:  Family Medical History     Problem Relation (Age of Onset)    Breast Cancer Mother    Diabetes Paternal Grandfather, Brother    No Known Problems Father, Sister, Maternal Grandmother, Maternal Grandfather, Paternal  62, Daughter, Son, Maternal Aunt, Maternal Uncle, Paternal 30, Paternal Uncle, Other            Past Medical History:   Diagnosis Date    Abnormal Pap smear     ASCUS, ASCUS cannot rule out HGSIL    Colon polyp     Raynaud's disease     STD (sexually transmitted disease)     HSV    Unspecified disorder of thyroid     Hypothyroidism         Past Surgical History:   Procedure Laterality Date    HX CATARACT REMOVAL Bilateral 2011    HX COLONOSCOPY  2009    HX ELBOW SURGERY      HX FOOT SURGERY      HX TUBAL LIGATION  1978    PB FOREARM/WRIST SURGERY UNLISTED  1999    carpal tunnel release         Patient Active Problem List    Diagnosis    Drug-induced pancytopenia (CMS HCC)    Other neutropenia (CMS HCC)    Malignant neoplasm of lung, unspecified laterality, unspecified part of lung (CMS [facility])    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    loratadine (CLARITIN) 10 mg Oral Tablet take 10 mg by mouth Once a day.    Magic Mouthwash 10 mL swish and spit Every 4 hours as needed for Pain Solution contains lidocaine viscous 2%, diphenhydramine 12.5 mg/5 mL, maalox.  Mix 1:1:1    Nifedipine (ADALAT CC) 30 mg Oral Tablet Sustained Release take 30 mg by mouth Once a day.    OMEGA-3 FATTY ACIDS (FISH OIL ORAL) take  by mouth Once a day.    ondansetron (ZOFRAN ODT) 4 mg Oral Tablet, Rapid Dissolve Take 1 Tab (4 mg total) by mouth Every 8 hours as  needed for nausea/vomiting (Patient not taking: Reported on 03/03/2017)    pantoprazole (PROTONIX) 20 mg Oral Tablet, Delayed Release (E.C.) Take 1 Tab (20 mg total) by mouth Every morning before breakfast       Review of Systems:  Normal  HEENT:  No sore throat, no occular pain.  Respiratory:   No cough or SOB.  Cardiovascular: no chest pain or palpitation.  GI: See HPI  GU:  No S/S of dysuria, flank pain or urinary retention.  Neuro: No headaches, dizziness or weakness.  Hematologic: No fever, night sweats or chills.  Rheum: no joint pain.   Skin: no rash no purites no jaundice.  Constitutional: no unintentional weight loss.    Physical Exam  BP 108/74   Pulse (!) 125   Ht 1.549 m (5\' 1" )   Wt 48.1 kg (106 lb)   BMI 20.03 kg/m2  Body mass index is 20.03 kg/(m^2).  General:  Alert and oriented, no distress.  HEENT: Unremarkable, no lymphadenopathy or JVD  Neck:  Supple.  Pulmonary:  Clear to auscultation bilaterally.  Cardiovascular:  normal S1/S2, no murmur  Gastrointestinal:   Bowel sounds positive soft nontender no masses or organomegaly no ascites  Musculoskeletal:  Bilateral extremities- no cyanosis, clubbing or edema.   Skin:  no rash.  Neurologic:  Grossly non focal.    Risks:  Patient is aware and understands the below risks:  Failure to do workup and have follow up could result in missed cancer.   Risk of EGD or colonoscopy:  Bleeding and infection, 3% chance missed cancer, 15% chance missed polyps, 1 in 5000 chance of perforation which could lead to surgery and lead to permanent disability and/or death.    Labs:    Results for orders placed or performed during the hospital encounter of 02/17/17 (from the past 2016 hour(s))   CBC WITH DIFF    Collection Time: 02/17/17  3:10 PM   Result Value Ref Range    WBC 2.8 (L) 4.0 - 11.0 x103/uL    RBC 3.90 (L) 4.00 - 5.10 x106/uL    HGB 11.7 (L) 12.0 - 15.5 g/dL    HCT 34.6 (L) 36.0 - 45.0 %    MCV 88.7 82.0 - 97.0 fL    MCH 29.9 27.5 - 33.2 pg    MCHC 33.7 32.0 -  36.0 g/dL  RDW 16.1 (H) 11.0 - 16.0 %    PLATELETS 150 150 - 450 x103/uL    MPV 7.9 7.4 - 10.5 fL    NEUTROPHIL % 53 43 - 76 %    LYMPHOCYTE % 21 15 - 43 %    MONOCYTE % 24 (H) 5 - 12 %    EOSINOPHIL % 1 0 - 5 %    BASOPHIL % 1 0 - 3 %    NEUTROPHIL # 1.47 (L) 1.50 - 6.50 x103/uL    LYMPHOCYTE # 0.58 (L) 1.00 - 4.80 x103/uL    MONOCYTE # 0.66 0.20 - 0.90 x103/uL    EOSINOPHIL # 0.03 0.00 - 0.50 x103/uL    BASOPHIL # 0.02 0.00 - 0.10 x103/uL   Results for orders placed or performed in visit on 02/06/17 (from the past 2016 hour(s))   COMPREHENSIVE METABOLIC PROFILE - [facility] ONLY    Collection Time: 02/06/17  2:39 PM   Result Value Ref Range    SODIUM 136 136 - 145 mmol/L    POTASSIUM 3.4 3.4 - 5.1 mmol/L    CHLORIDE 102 101 - 111 mmol/L    CO2 TOTAL 28 22 - 32 mmol/L    ANION GAP 6 3 - 11 mmol/L    BUN 7 6 - 20 mg/dL    CREATININE 0.76 0.44 - 1.00 mg/dL    BUN/CREA RATIO 9 6 - 22    ESTIMATED GFR >60 >60 mL/min/1.55m2    ALBUMIN 3.3 (L) 3.5 - 5.0 g/dL    CALCIUM 8.7 (L) 8.8 - 10.2 mg/dL    GLUCOSE 129 (H) 70 - 110 mg/dL    ALKALINE PHOSPHATASE 77 38 - 126 U/L    ALT (SGPT) 22 14 - 54 U/L    AST (SGOT) 29 15 - 41 U/L    BILIRUBIN TOTAL 0.3 0.3 - 1.2 mg/dL    PROTEIN TOTAL 5.7 (L) 6.4 - 8.3 g/dL    ALBUMIN/GLOBULIN RATIO 1.4 0.8 - 2.0   MAGNESIUM    Collection Time: 02/06/17  2:39 PM   Result Value Ref Range    MAGNESIUM 1.8 1.4 - 2.1 mg/dL   CBC WITH DIFF    Collection Time: 02/06/17  2:39 PM   Result Value Ref Range    WBC 7.4 4.0 - 11.0 x103/uL    RBC 3.97 (L) 4.00 - 5.10 x106/uL    HGB 12.1 12.0 - 15.5 g/dL    HCT 34.9 (L) 36.0 - 45.0 %    MCV 87.7 82.0 - 97.0 fL    MCH 30.4 27.5 - 33.2 pg    MCHC 34.7 32.0 - 36.0 g/dL    RDW 16.9 (H) 11.0 - 16.0 %    PLATELETS 207 150 - 450 x103/uL    MPV 9.0 7.4 - 10.5 fL   MANUAL DIFFERENTIAL    Collection Time: 02/06/17  2:39 PM   Result Value Ref Range    NEUTROPHIL % 73 43 - 76 %    LYMPHOCYTE % 4 (L) 15 - 43 %    MONOCYTE % 2 (L) 5 - 12 %    BAND % 19 (H) 0 - 4 %     METAMYELOCYTE % 1 0 - 1 %    MYELOCYTE % 1 (H) 0 - 0 %    NEUTROPHIL ABSOLUTE 6.81 (H) 1.50 - 6.50 x103/uL    LYMPHOCYTE ABSOLUTE 0.30 (L) 1.00 - 4.80 x103/uL    MONOCYTE ABSOLUTE 0.15 (L) 0.20 - 0.90 x103/uL  METAMYELOCYTE ABSOLUTE 0.04 x103/uL    MYELOCYTE ABSOLUTE 0.04 x103/uL    PLATELET ESTIMATE Adequate     ANISOCYTOSIS Slight     ECHINOCYTE (BURR CELL) Marked     WBC MORPHOLOGY COMMENT Normal     WBC 7.4 x103/uL   Results for orders placed or performed during the hospital encounter of 02/02/17 (from the past 2016 hour(s))   CBC WITH DIFF    Collection Time: 02/02/17 10:15 AM   Result Value Ref Range    WBC 1.6 (LL) 4.0 - 11.0 x103/uL    RBC 4.08 4.00 - 5.10 x106/uL    HGB 12.1 12.0 - 15.5 g/dL    HCT 36.1 36.0 - 45.0 %    MCV 88.4 82.0 - 97.0 fL    MCH 29.7 27.5 - 33.2 pg    MCHC 33.6 32.0 - 36.0 g/dL    RDW 15.0 11.0 - 16.0 %    PLATELETS 258 150 - 450 x103/uL    MPV 7.7 7.4 - 10.5 fL    NEUTROPHIL % 82 (H) 43 - 76 %    LYMPHOCYTE % 13 (L) 15 - 43 %    MONOCYTE % 3 (L) 5 - 12 %    EOSINOPHIL % 2 0 - 5 %    BASOPHIL % 0 0 - 3 %    NEUTROPHIL # 1.29 (L) 1.50 - 6.50 x103/uL    LYMPHOCYTE # 0.20 (L) 1.00 - 4.80 x103/uL    MONOCYTE # 0.05 (L) 0.20 - 0.90 x103/uL    EOSINOPHIL # 0.03 0.00 - 0.50 x103/uL    BASOPHIL # 0.00 0.00 - 0.10 x103/uL   Results for orders placed or performed in visit on 01/30/17 (from the past 2016 hour(s))   COMPREHENSIVE METABOLIC PROFILE - [facility] ONLY    Collection Time: 01/30/17  2:41 PM   Result Value Ref Range    SODIUM 132 (L) 136 - 145 mmol/L    POTASSIUM 3.5 3.4 - 5.1 mmol/L    CHLORIDE 100 (L) 101 - 111 mmol/L    CO2 TOTAL 25 22 - 32 mmol/L    ANION GAP 7 3 - 11 mmol/L    BUN 6 6 - 20 mg/dL    CREATININE 0.64 0.44 - 1.00 mg/dL    BUN/CREA RATIO 9 6 - 22    ESTIMATED GFR >60 >60 mL/min/1.88m2    ALBUMIN 3.1 (L) 3.5 - 5.0 g/dL    CALCIUM 8.7 (L) 8.8 - 10.2 mg/dL    GLUCOSE 130 (H) 70 - 110 mg/dL    ALKALINE PHOSPHATASE 58 38 - 126 U/L    ALT (SGPT) 20 14 - 54  U/L    AST (SGOT) 27 15 - 41 U/L    BILIRUBIN TOTAL 0.1 (L) 0.3 - 1.2 mg/dL    PROTEIN TOTAL 5.5 (L) 6.4 - 8.3 g/dL    ALBUMIN/GLOBULIN RATIO 1.3 0.8 - 2.0   MAGNESIUM    Collection Time: 01/30/17  2:41 PM   Result Value Ref Range    MAGNESIUM 1.9 1.4 - 2.1 mg/dL   CBC WITH DIFF    Collection Time: 01/30/17  2:41 PM   Result Value Ref Range    WBC 1.4 (LL) 4.0 - 11.0 x103/uL    RBC 3.99 (L) 4.00 - 5.10 x106/uL    HGB 12.1 12.0 - 15.5 g/dL    HCT 34.7 (L) 36.0 - 45.0 %    MCV 87.1 82.0 - 97.0 fL    MCH 30.3 27.5 - 33.2  pg    MCHC 34.8 32.0 - 36.0 g/dL    RDW 15.8 11.0 - 16.0 %    PLATELETS 241 150 - 450 x103/uL    MPV 8.2 7.4 - 10.5 fL    NEUTROPHIL % 73 43 - 76 %    LYMPHOCYTE % 9 (L) 15 - 43 %    MONOCYTE % 18 (H) 5 - 12 %    EOSINOPHIL % 0 0 - 5 %    BASOPHIL % 1 0 - 3 %    NEUTROPHIL # 1.00 (L) 1.50 - 6.50 x103/uL    LYMPHOCYTE # 0.10 (L) 1.00 - 4.80 x103/uL    MONOCYTE # 0.20 0.20 - 0.90 x103/uL    EOSINOPHIL # 0.00 0.00 - 0.50 x103/uL    BASOPHIL # 0.00 0.00 - 0.10 x103/uL   Results for orders placed or performed in visit on 01/23/17 (from the past 2016 hour(s))   COMPREHENSIVE METABOLIC PROFILE - [facility] ONLY    Collection Time: 01/23/17  2:54 PM   Result Value Ref Range    SODIUM 134 (L) 136 - 145 mmol/L    POTASSIUM 4.2 3.4 - 5.1 mmol/L    CHLORIDE 99 (L) 101 - 111 mmol/L    CO2 TOTAL 26 22 - 32 mmol/L    ANION GAP 9 3 - 11 mmol/L    BUN 7 6 - 20 mg/dL    CREATININE 0.74 0.44 - 1.00 mg/dL    BUN/CREA RATIO 9 6 - 22    ESTIMATED GFR >60 >60 mL/min/1.32m2    ALBUMIN 3.6 3.5 - 5.0 g/dL    CALCIUM 9.0 8.8 - 10.2 mg/dL    GLUCOSE 101 70 - 110 mg/dL    ALKALINE PHOSPHATASE 64 38 - 126 U/L    ALT (SGPT) 15 14 - 54 U/L    AST (SGOT) 21 15 - 41 U/L    BILIRUBIN TOTAL 0.5 0.3 - 1.2 mg/dL    PROTEIN TOTAL 6.2 (L) 6.4 - 8.3 g/dL    ALBUMIN/GLOBULIN RATIO 1.4 0.8 - 2.0   MAGNESIUM    Collection Time: 01/23/17  2:54 PM   Result Value Ref Range    MAGNESIUM 2.1 1.4 - 2.1 mg/dL   CBC WITH DIFF    Collection Time:  01/23/17  2:54 PM   Result Value Ref Range    WBC 1.7 (LL) 4.0 - 11.0 x103/uL    RBC 4.19 4.00 - 5.10 x106/uL    HGB 12.7 12.0 - 15.5 g/dL    HCT 36.3 36.0 - 45.0 %    MCV 86.6 82.0 - 97.0 fL    MCH 30.2 27.5 - 33.2 pg    MCHC 34.9 32.0 - 36.0 g/dL    RDW 15.2 11.0 - 16.0 %    PLATELETS 198 150 - 450 x103/uL    MPV 8.3 7.4 - 10.5 fL    NEUTROPHIL % 79 (H) 43 - 76 %    LYMPHOCYTE % 8 (L) 15 - 43 %    MONOCYTE % 12 5 - 12 %    EOSINOPHIL % 0 0 - 5 %    BASOPHIL % 1 0 - 3 %    NEUTROPHIL # 1.30 (L) 1.50 - 6.50 x103/uL    LYMPHOCYTE # 0.10 (L) 1.00 - 4.80 x103/uL    MONOCYTE # 0.20 0.20 - 0.90 x103/uL    EOSINOPHIL # 0.00 0.00 - 0.50 x103/uL    BASOPHIL # 0.00 0.00 - 0.10 x103/uL   Results for orders  placed or performed in visit on 01/16/17 (from the past 2016 hour(s))   COMPREHENSIVE METABOLIC PROFILE - [facility] ONLY    Collection Time: 01/16/17  2:33 PM   Result Value Ref Range    SODIUM 133 (L) 136 - 145 mmol/L    POTASSIUM 4.1 3.4 - 5.1 mmol/L    CHLORIDE 101 101 - 111 mmol/L    CO2 TOTAL 25 22 - 32 mmol/L    ANION GAP 7 3 - 11 mmol/L    BUN 9 6 - 20 mg/dL    CREATININE 0.66 0.44 - 1.00 mg/dL    BUN/CREA RATIO 14 6 - 22    ESTIMATED GFR >60 >60 mL/min/1.45m2    ALBUMIN 3.5 3.5 - 5.0 g/dL    CALCIUM 8.8 8.8 - 10.2 mg/dL    GLUCOSE 104 70 - 110 mg/dL    ALKALINE PHOSPHATASE 61 38 - 126 U/L    ALT (SGPT) 16 14 - 54 U/L    AST (SGOT) 18 15 - 41 U/L    BILIRUBIN TOTAL 0.4 0.3 - 1.2 mg/dL    PROTEIN TOTAL 6.2 (L) 6.4 - 8.3 g/dL    ALBUMIN/GLOBULIN RATIO 1.3 0.8 - 2.0   MAGNESIUM    Collection Time: 01/16/17  2:33 PM   Result Value Ref Range    MAGNESIUM 1.9 1.4 - 2.1 mg/dL   CBC WITH DIFF    Collection Time: 01/16/17  2:33 PM   Result Value Ref Range    WBC 1.6 (LL) 4.0 - 11.0 x103/uL    RBC 4.19 4.00 - 5.10 x106/uL    HGB 12.5 12.0 - 15.5 g/dL    HCT 36.3 36.0 - 45.0 %    MCV 86.7 82.0 - 97.0 fL    MCH 29.7 27.5 - 33.2 pg    MCHC 34.3 32.0 - 36.0 g/dL    RDW 14.3 11.0 - 16.0 %    PLATELETS 152 150 - 450  x103/uL    MPV 8.6 7.4 - 10.5 fL    NEUTROPHIL % 72 43 - 76 %    LYMPHOCYTE % 11 (L) 15 - 43 %    MONOCYTE % 16 (H) 5 - 12 %    EOSINOPHIL % 0 0 - 5 %    BASOPHIL % 1 0 - 3 %    NEUTROPHIL # 1.20 (L) 1.50 - 6.50 x103/uL    LYMPHOCYTE # 0.20 (L) 1.00 - 4.80 x103/uL    MONOCYTE # 0.30 0.20 - 0.90 x103/uL    EOSINOPHIL # 0.00 0.00 - 0.50 x103/uL    BASOPHIL # 0.00 0.00 - 0.10 x103/uL   Results for orders placed or performed in visit on 01/09/17 (from the past 2016 hour(s))   COMPREHENSIVE METABOLIC PROFILE - [facility] ONLY    Collection Time: 01/09/17  2:28 PM   Result Value Ref Range    SODIUM 135 (L) 136 - 145 mmol/L    POTASSIUM 4.1 3.4 - 5.1 mmol/L    CHLORIDE 102 101 - 111 mmol/L    CO2 TOTAL 25 22 - 32 mmol/L    ANION GAP 8 3 - 11 mmol/L    BUN 10 6 - 20 mg/dL    CREATININE 0.76 0.44 - 1.00 mg/dL    BUN/CREA RATIO 13 6 - 22    ESTIMATED GFR >60 >60 mL/min/1.32m2    ALBUMIN 3.5 3.5 - 5.0 g/dL    CALCIUM 9.0 8.8 - 10.2 mg/dL    GLUCOSE 99 70 - 110 mg/dL  ALKALINE PHOSPHATASE 60 38 - 126 U/L    ALT (SGPT) 17 14 - 54 U/L    AST (SGOT) 20 15 - 41 U/L    BILIRUBIN TOTAL 0.6 0.3 - 1.2 mg/dL    PROTEIN TOTAL 6.2 (L) 6.4 - 8.3 g/dL    ALBUMIN/GLOBULIN RATIO 1.3 0.8 - 2.0   MAGNESIUM    Collection Time: 01/09/17  2:28 PM   Result Value Ref Range    MAGNESIUM 1.9 1.4 - 2.1 mg/dL   CBC WITH DIFF    Collection Time: 01/09/17  2:28 PM   Result Value Ref Range    WBC 2.1 (L) 4.0 - 11.0 x103/uL    RBC 4.23 4.00 - 5.10 x106/uL    HGB 12.3 12.0 - 15.5 g/dL    HCT 36.5 36.0 - 45.0 %    MCV 86.3 82.0 - 97.0 fL    MCH 29.2 27.5 - 33.2 pg    MCHC 33.8 32.0 - 36.0 g/dL    RDW 14.2 11.0 - 16.0 %    PLATELETS 191 150 - 450 x103/uL    MPV 8.9 7.4 - 10.5 fL    NEUTROPHIL % 76 43 - 76 %    LYMPHOCYTE % 13 (L) 15 - 43 %    MONOCYTE % 9 5 - 12 %    EOSINOPHIL % 1 0 - 5 %    BASOPHIL % 1 0 - 3 %    NEUTROPHIL # 1.60 1.50 - 6.50 x103/uL    LYMPHOCYTE # 0.30 (L) 1.00 - 4.80 x103/uL    MONOCYTE # 0.20 0.20 - 0.90 x103/uL     EOSINOPHIL # 0.00 0.00 - 0.50 x103/uL    BASOPHIL # 0.00 0.00 - 0.10 x103/uL   Results for orders placed or performed in visit on 01/03/17 (from the past 2016 hour(s))   COMPREHENSIVE METABOLIC PROFILE - [facility] ONLY    Collection Time: 01/03/17  9:32 AM   Result Value Ref Range    SODIUM 138 136 - 145 mmol/L    POTASSIUM 4.4 3.4 - 5.1 mmol/L    CHLORIDE 104 101 - 111 mmol/L    CO2 TOTAL 26 22 - 32 mmol/L    ANION GAP 8 3 - 11 mmol/L    BUN 9 6 - 20 mg/dL    CREATININE 0.64 0.44 - 1.00 mg/dL    BUN/CREA RATIO 14 6 - 22    ESTIMATED GFR >60 >60 mL/min/1.60m2    ALBUMIN 3.5 3.5 - 5.0 g/dL    CALCIUM 9.1 8.8 - 10.2 mg/dL    GLUCOSE 94 70 - 110 mg/dL    ALKALINE PHOSPHATASE 57 38 - 126 U/L    ALT (SGPT) 18 14 - 54 U/L    AST (SGOT) 21 15 - 41 U/L    BILIRUBIN TOTAL 0.4 0.3 - 1.2 mg/dL    PROTEIN TOTAL 6.6 6.4 - 8.3 g/dL    ALBUMIN/GLOBULIN RATIO 1.1 0.8 - 2.0   MAGNESIUM    Collection Time: 01/03/17  9:32 AM   Result Value Ref Range    MAGNESIUM 1.9 1.4 - 2.1 mg/dL   CBC WITH DIFF    Collection Time: 01/03/17  9:32 AM   Result Value Ref Range    WBC 1.8 (LL) 4.0 - 11.0 x103/uL    RBC 4.33 4.00 - 5.10 x106/uL    HGB 12.5 12.0 - 15.5 g/dL    HCT 37.7 36.0 - 45.0 %    MCV 87.0 82.0 - 97.0 fL  MCH 28.9 27.5 - 33.2 pg    MCHC 33.3 32.0 - 36.0 g/dL    RDW 13.9 11.0 - 16.0 %    PLATELETS 215 150 - 450 x103/uL    MPV 8.7 7.4 - 10.5 fL   MANUAL DIFFERENTIAL    Collection Time: 01/03/17  9:32 AM   Result Value Ref Range    NEUTROPHIL % 68 43 - 76 %    LYMPHOCYTE % 21 15 - 43 %    MONOCYTE % 6 5 - 12 %    EOSINOPHIL % 3 0 - 5 %    BASOPHIL % 1 0 - 3 %    BAND % 1 0 - 4 %    NEUTROPHIL ABSOLUTE 1.24 (L) 1.50 - 6.50 x103/uL    LYMPHOCYTE ABSOLUTE 0.38 (L) 1.00 - 4.80 x103/uL    MONOCYTE ABSOLUTE 0.11 (L) 0.20 - 0.90 x103/uL    EOSINOPHIL ABSOLUTE 0.05 0.00 - 0.50 x103/uL    BASOPHIL ABSOLUTE 0.02 0.00 - 0.10 x103/uL    PLATELET ESTIMATE Adequate     RBC MORPHOLOGY COMMENT Normal     WBC MORPHOLOGY COMMENT Normal     WBC  1.8 x103/uL   Results for orders placed or performed in visit on 12/26/16 (from the past 2016 hour(s))   CBC W/AUTO DIFF - [facility] ONLY    Collection Time: 12/26/16  2:28 PM   Result Value Ref Range    WBC 3.8 (L) 4.0 - 11.0 x103/uL    RBC 4.53 4.00 - 5.10 x106/uL    HGB 13.0 12.0 - 15.5 g/dL    HCT 39.6 36.0 - 45.0 %    MCV 87.4 82.0 - 97.0 fL    MCH 28.8 27.5 - 33.2 pg    MCHC 32.9 32.0 - 36.0 g/dL    RDW 14.2 11.0 - 16.0 %    PLATELETS 321 150 - 450 x103/uL    MPV 9.3 7.4 - 10.5 fL    NEUTROPHIL % 73 43 - 76 %    LYMPHOCYTE % 21 15 - 43 %    MONOCYTE % 5 5 - 12 %    EOSINOPHIL % 1 0 - 5 %    BASOPHIL % 1 0 - 3 %    NEUTROPHIL # 2.70 1.50 - 6.50 x103/uL    LYMPHOCYTE # 0.80 (L) 1.00 - 4.80 x103/uL    MONOCYTE # 0.20 0.20 - 0.90 x103/uL    EOSINOPHIL # 0.00 0.00 - 0.50 x103/uL    BASOPHIL # 0.00 0.00 - 0.10 x103/uL   COMPREHENSIVE METABOLIC PROFILE - [facility] ONLY    Collection Time: 12/26/16  2:28 PM   Result Value Ref Range    SODIUM 137 136 - 145 mmol/L    POTASSIUM 4.2 3.4 - 5.1 mmol/L    CHLORIDE 103 101 - 111 mmol/L    CO2 TOTAL 26 22 - 32 mmol/L    ANION GAP 8 3 - 11 mmol/L    BUN 9 6 - 20 mg/dL    CREATININE 0.69 0.44 - 1.00 mg/dL    BUN/CREA RATIO 13 6 - 22    ESTIMATED GFR >60 >60 mL/min/1.55m2    ALBUMIN 3.4 (L) 3.5 - 5.0 g/dL    CALCIUM 9.3 8.8 - 10.2 mg/dL    GLUCOSE 103 70 - 110 mg/dL    ALKALINE PHOSPHATASE 57 38 - 126 U/L    ALT (SGPT) 17 14 - 54 U/L    AST (SGOT) 22 15 - 41 U/L  BILIRUBIN TOTAL 0.5 0.3 - 1.2 mg/dL    PROTEIN TOTAL 6.6 6.4 - 8.3 g/dL    ALBUMIN/GLOBULIN RATIO 1.1 0.8 - 2.0   MAGNESIUM    Collection Time: 12/26/16  2:28 PM   Result Value Ref Range    MAGNESIUM 2.0 1.4 - 2.1 mg/dL   Results for orders placed or performed during the hospital encounter of 12/22/16 (from the past 2016 hour(s))   CBC W/AUTO DIFF - [facility] ONLY    Collection Time: 12/22/16  1:27 PM   Result Value Ref Range    WBC 4.6 4.0 - 11.0 x103/uL    RBC 4.39 4.00 - 5.10 x106/uL    HGB 12.7 12.0 - 15.5 g/dL     HCT 37.8 36.0 - 45.0 %    MCV 86.2 82.0 - 97.0 fL    MCH 29.0 27.5 - 33.2 pg    MCHC 33.7 32.0 - 36.0 g/dL    RDW 14.2 11.0 - 16.0 %    PLATELETS 305 150 - 450 x103/uL    MPV 8.0 7.4 - 10.5 fL    NEUTROPHIL % 80 (H) 43 - 76 %    LYMPHOCYTE % 17 15 - 43 %    MONOCYTE % 1 (L) 5 - 12 %    EOSINOPHIL % 1 0 - 5 %    BASOPHIL % 0 0 - 3 %    NEUTROPHIL # 3.67 1.50 - 6.50 x103/uL    LYMPHOCYTE # 0.78 (L) 1.00 - 4.80 x103/uL    MONOCYTE # 0.06 (L) 0.20 - 0.90 x103/uL    EOSINOPHIL # 0.05 0.00 - 0.50 x103/uL    BASOPHIL # 0.01 0.00 - 0.10 x103/uL   COMPREHENSIVE METABOLIC PROFILE - [facility] ONLY    Collection Time: 12/22/16  1:27 PM   Result Value Ref Range    SODIUM 136 136 - 145 mmol/L    POTASSIUM 4.2 3.4 - 5.1 mmol/L    CHLORIDE 104 101 - 111 mmol/L    CO2 TOTAL 25 22 - 32 mmol/L    ANION GAP 7 3 - 11 mmol/L    BUN 11 6 - 20 mg/dL    CREATININE 0.68 0.44 - 1.00 mg/dL    BUN/CREA RATIO 16 6 - 22    ESTIMATED GFR >60 >60 mL/min/1.34m2    ALBUMIN 3.6 3.5 - 5.0 g/dL    CALCIUM 9.4 8.8 - 10.2 mg/dL    GLUCOSE 146 (H) 70 - 110 mg/dL    ALKALINE PHOSPHATASE 64 38 - 126 U/L    ALT (SGPT) 16 14 - 54 U/L    AST (SGOT) 20 15 - 41 U/L    BILIRUBIN TOTAL 0.3 0.3 - 1.2 mg/dL    PROTEIN TOTAL 7.1 6.4 - 8.3 g/dL    ALBUMIN/GLOBULIN RATIO 1.0 0.8 - 2.0   MAGNESIUM    Collection Time: 12/22/16  1:27 PM   Result Value Ref Range    MAGNESIUM 2.0 1.4 - 2.1 mg/dL        Assessment and Plan       ICD-10-CM    1. Abdominal pain, epigastric R10.13 CBC/DIFF (AUTOMATED)     COMPREHENSIVE METABOLIC PANEL, NON-FASTING     UPPER ENDOSCOPY     FLUORO ESOPHAGRAM (BA SWALLOW)   2. Dysphagia R13.10 CBC/DIFF (AUTOMATED)     COMPREHENSIVE METABOLIC PANEL, NON-FASTING     UPPER ENDOSCOPY     FLUORO ESOPHAGRAM (BA SWALLOW)     Return in 3 months (on 06/02/2017).  [employer], DO 03/03/2017, 20:14

## 2017-03-09 ENCOUNTER — Inpatient Hospital Stay (HOSPITAL_BASED_OUTPATIENT_CLINIC_OR_DEPARTMENT_OTHER): Payer: BC Managed Care – PPO | Attending: Hematology & Oncology | Admitting: Hematology & Oncology

## 2017-03-09 ENCOUNTER — Encounter (HOSPITAL_BASED_OUTPATIENT_CLINIC_OR_DEPARTMENT_OTHER): Payer: Self-pay | Admitting: Hematology & Oncology

## 2017-03-09 ENCOUNTER — Inpatient Hospital Stay
Admission: RE | Admit: 2017-03-09 | Discharge: 2017-03-09 | Disposition: A | Discharge status: Still In Hospital | Payer: BC Managed Care – PPO | Source: Ambulatory Visit | Attending: Hematology & Oncology | Admitting: Hematology & Oncology

## 2017-03-09 VITALS — BP 96/59 | HR 120 | Temp 98.4°F | Resp 22 | Ht 60.98 in | Wt 107.0 lb

## 2017-03-09 DIAGNOSIS — D649 Anemia, unspecified: Secondary | ICD-10-CM

## 2017-03-09 DIAGNOSIS — C3490 Malignant neoplasm of unspecified part of unspecified bronchus or lung: Secondary | ICD-10-CM | POA: Insufficient documentation

## 2017-03-09 DIAGNOSIS — E039 Hypothyroidism, unspecified: Secondary | ICD-10-CM | POA: Insufficient documentation

## 2017-03-09 DIAGNOSIS — Z79899 Other long term (current) drug therapy: Secondary | ICD-10-CM | POA: Insufficient documentation

## 2017-03-09 DIAGNOSIS — Z95828 Presence of other vascular implants and grafts: Secondary | ICD-10-CM | POA: Insufficient documentation

## 2017-03-09 DIAGNOSIS — Z6820 Body mass index (BMI) 20.0-20.9, adult: Secondary | ICD-10-CM

## 2017-03-09 DIAGNOSIS — R1013 Epigastric pain: Secondary | ICD-10-CM | POA: Insufficient documentation

## 2017-03-09 DIAGNOSIS — Z86010 Personal history of colonic polyps: Secondary | ICD-10-CM | POA: Insufficient documentation

## 2017-03-09 DIAGNOSIS — K5900 Constipation, unspecified: Secondary | ICD-10-CM | POA: Insufficient documentation

## 2017-03-09 DIAGNOSIS — D61811 Other drug-induced pancytopenia: Secondary | ICD-10-CM

## 2017-03-09 DIAGNOSIS — I7300 Raynaud's syndrome without gangrene: Secondary | ICD-10-CM | POA: Insufficient documentation

## 2017-03-09 DIAGNOSIS — D708 Other neutropenia: Secondary | ICD-10-CM

## 2017-03-09 DIAGNOSIS — R112 Nausea with vomiting, unspecified: Secondary | ICD-10-CM | POA: Insufficient documentation

## 2017-03-09 DIAGNOSIS — Z79890 Hormone replacement therapy (postmenopausal): Secondary | ICD-10-CM | POA: Insufficient documentation

## 2017-03-09 LAB — CBC WITH DIFF
BASOPHIL #: 0.04 x10ˆ3/uL (ref 0.00–0.10)
BASOPHIL %: 1 % (ref 0–3)
EOSINOPHIL #: 0.09 x10ˆ3/uL (ref 0.00–0.50)
EOSINOPHIL %: 1 % (ref 0–5)
HCT: 31.2 % — ABNORMAL LOW (ref 36.0–45.0)
HGB: 10.5 g/dL — ABNORMAL LOW (ref 12.0–15.5)
LYMPHOCYTE #: 0.83 x10?3/uL — ABNORMAL LOW (ref 1.00–4.80)
LYMPHOCYTE #: 0.83 x10ˆ3/uL — ABNORMAL LOW (ref 1.00–4.80)
LYMPHOCYTE %: 10 % — ABNORMAL LOW (ref 15–43)
MCH: 31.6 pg (ref 27.5–33.2)
MCHC: 33.5 g/dL (ref 32.0–36.0)
MCV: 94.3 fL (ref 82.0–97.0)
MONOCYTE #: 0.88 x10ˆ3/uL (ref 0.20–0.90)
MONOCYTE %: 10 % (ref 5–12)
MPV: 8 fL (ref 7.4–10.5)
MPV: 8 fL (ref 7.4–10.5)
NEUTROPHIL #: 6.91 x10ˆ3/uL — ABNORMAL HIGH (ref 1.50–6.50)
NEUTROPHIL %: 79 % — ABNORMAL HIGH (ref 43–76)
PLATELETS: 184 x10ˆ3/uL (ref 150–450)
RBC: 3.3 x10ˆ6/uL — ABNORMAL LOW (ref 4.00–5.10)
RDW: 20.3 % — ABNORMAL HIGH (ref 11.0–16.0)
WBC: 8.8 x10ˆ3/uL (ref 4.0–11.0)

## 2017-03-09 LAB — COMPREHENSIVE METABOLIC PROFILE - BMC/JMC ONLY
ALBUMIN/GLOBULIN RATIO: 1.1 (ref 0.8–2.0)
ALBUMIN: 3.1 g/dL — ABNORMAL LOW (ref 3.5–5.0)
ALKALINE PHOSPHATASE: 64 U/L (ref 38–126)
ALKALINE PHOSPHATASE: 64 U/L (ref 38–126)
ALT (SGPT): 18 U/L (ref 14–54)
ANION GAP: 9 mmol/L (ref 3–11)
AST (SGOT): 26 U/L (ref 15–41)
BILIRUBIN TOTAL: 0.8 mg/dL (ref 0.3–1.2)
BILIRUBIN TOTAL: 0.8 mg/dL (ref 0.3–1.2)
BUN/CREA RATIO: 17 (ref 6–22)
BUN: 10 mg/dL (ref 6–20)
CALCIUM: 8.7 mg/dL — ABNORMAL LOW (ref 8.8–10.2)
CHLORIDE: 105 mmol/L (ref 101–111)
CO2 TOTAL: 23 mmol/L (ref 22–32)
CREATININE: 0.6 mg/dL (ref 0.44–1.00)
ESTIMATED GFR: >60 mL/min/1.73mˆ2 (ref >60)
ESTIMATED GFR: >60 mL/min/{1.73_m2} (ref >60)
GLUCOSE: 114 mg/dL — ABNORMAL HIGH (ref 70–110)
GLUCOSE: 114 mg/dL — ABNORMAL HIGH (ref 70–110)
POTASSIUM: 4.1 mmol/L (ref 3.4–5.1)
PROTEIN TOTAL: 5.9 g/dL — ABNORMAL LOW (ref 6.4–8.3)
SODIUM: 137 mmol/L (ref 136–145)

## 2017-03-09 LAB — MAGNESIUM
MAGNESIUM: 2.2 mg/dL — ABNORMAL HIGH (ref 1.4–2.1)
MAGNESIUM: 2.2 mg/dL — ABNORMAL HIGH (ref 1.4–2.1)

## 2017-03-09 MED ORDER — POLYETHYLENE GLYCOL 3350 17 GRAM ORAL POWDER PACKET: 17 g | Packet | Freq: Two times a day (BID) | ORAL | 3 refills | 0 days | Status: DC | PRN

## 2017-03-09 MED ORDER — HEPARIN, PORCINE (PF) 100 UNIT/ML INTRAVENOUS SYRINGE
5.0000 mL | INJECTION | Freq: Once | INTRAVENOUS | Status: AC
Start: 2017-03-09 — End: 2017-03-09
  Administered 2017-03-09: 5 mL
  Filled 2017-03-09: qty 5

## 2017-03-09 MED ORDER — SODIUM CHLORIDE 0.9 % (FLUSH) INJECTION SYRINGE
10.0000 mL | INJECTION | Freq: Once | INTRAMUSCULAR | Status: AC
Start: 2017-03-09 — End: 2017-03-09
  Administered 2017-03-09: 10 mL

## 2017-03-09 NOTE — Nurses Notes (Signed)
Patient discharged home.  AVS reviewed with patient.  A written copy of the AVS and discharge instructions was given to the patient.  Questions sufficiently answered as needed.  Patient encouraged to follow up with PCP as indicated.  In the event of an emergency, patient instructed to call 911 or go to the nearest emergency room.

## 2017-03-09 NOTE — Discharge Instructions (Signed)

## 2017-03-09 NOTE — Progress Notes (Signed)
[address], [address]  [place] [address]  Operated by [facility]  Return Patient Progress Note    Date: 03/09/2017    Name: Gina Barrett  MRN: [id number]  Referring Physician: No ref. provider found  Primary Care Provider: Lanier Ensign    Reason for visit/consultation Other (f/u Malignant neoplasm of lung)      Interval History:  Information Obtained from: patient, relative and history reviewed via medical record  Gina Barrett is a 61 y.o. female history of stage IIIB non-small cell lung cancer and status post chemoradiation with carboplatin, paclitaxel.  She presents today as a routine follow-up for her previously described epigastric discomfort. She was evaluated by GI who plans to do a scope later this month.    Pt reports constipation. Appetite has been increasing. She still has some scabs on her ears and some pain as well. No other complaints.     ROS:   A 12 point review systems was discussed, as per HPI, and otherwise negative.    Past Medical History  Past Medical History:   Diagnosis Date   . Abnormal Pap smear     ASCUS, ASCUS cannot rule out HGSIL   . Colon polyp    . Raynaud's disease    . STD (sexually transmitted disease)     HSV   . Unspecified disorder of thyroid     Hypothyroidism       Current Outpatient Prescriptions   Medication Sig   . Conj Estrog-Medroxyprogest Ace 0.3-1.5 mg Oral Tablet Take 1 Tab by mouth Once a day   . cycloSPORINE (RESTASIS) 0.05 % Ophthalmic Dropperette Instill 1 Drop into both eyes Every 12 hours   . levothyroxine (SYNTHROID) 88 mcg Oral Tablet take 88 mcg by mouth Once a day.   . loratadine (CLARITIN) 10 mg Oral Tablet take 10 mg by mouth Once a day.   . Magic Mouthwash 10 mL swish and spit Every 4 hours as needed for Pain Solution contains lidocaine viscous 2%, diphenhydramine 12.5 mg/5 mL, maalox.  Mix 1:1:1   . Nifedipine (ADALAT CC) 30 mg Oral Tablet  Sustained Release take 30 mg by mouth Once a day.   . OMEGA-3 FATTY ACIDS (FISH OIL ORAL) take  by mouth Once a day.   . ondansetron (ZOFRAN ODT) 4 mg Oral Tablet, Rapid Dissolve Take 1 Tab (4 mg total) by mouth Every 8 hours as needed for nausea/vomiting (Patient not taking: Reported on 03/03/2017)   . pantoprazole (PROTONIX) 20 mg Oral Tablet, Delayed Release (E.C.) Take 1 Tab (20 mg total) by mouth Every morning before breakfast          Physical Examination:  There were no vitals taken for this visit.  ECOG Status: 1 - Restricted in physically strenuous activity, but ambulatory and able to carry out work of a light or sedentary nature, e.g., light housework, office work.  KPS Score: 90- Able to carry on normal activity; minor signs or symptoms of disease  General: appears in good health  Eyes: Sclera non-icteric.   HENT:Mouth mucous membranes moist.   Neck: no adenopathy  Lungs: clear to auscultation bilaterally.   Cardiovascular: RRR without murmur.  Abdomen: soft, non-tender  Extremities: no cyanosis or edema  Skin: right ear tragus has improved with some scabbing, left ear has erythema and scabs  Neurologic: grossly normal  Lymphatics: No nodes appreciated  Psychiatric: AOx3  Access:  port    Labs:  No results found for this or any previous visit (from the past 36 hour(s)).    Radiology:    Other outside studies:     Assessment/Plan: Gina Barrett is a 61 y.o. 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  She continues to have epigastric discomfort, upper GI scope planned on 03/30/2017.  She is scheduled to have a barium swallow on 03/12/2017.      1. Malignant neoplasm of lung, unspecified laterality, unspecified part of lung (CMS HCC)  -s/p chemoradiation, carboplatin, paclitaxel  -surveillance: history and physical in chest CT with and without contrast every 3-6 months for 3 years, then H&P and chest CT plus or minus  contrast every 6 months for 2 years, then H&P and low-dose noncontrast enhanced chest CT annually    2. Epigastric discomfort  -evaluation by GI, input appreciated  -thought to be secondary to radiation therapy  -barium swallow and upper GI scope scheduled for the next 4 weeks    3. Constipation, unspecified constipation type  -etiology unknown, recommended laxative    4. Anemia, unspecified type  -may be secondary to chemotherapy, will order iron studies to assess  - FERRITIN; Future  - VITAMIN B12; Future  - FOLATE; Future  - IRON TRANSFERRIN AND TIBC; Future    Return to clinic in 8 weeks    Dr. Beryle Flock  CC:  PCP General:  [facility] [address]  [place] [id number]    Scribe Attestation:  I, [place], scribed for Dr. Barbaraann Rondo on 13:55p 03/09/2017    Physician Attestation:  Documentation assistance provided for Carlena Hurl, MD by Rozell Searing, [place]. information recorded by the scribe was done at my direction and has been reviewed and validated by me Carlena Hurl, MD. 14:18    Portions of this note may be dictated using voice recognition software or a dictation service. Variances in spelling and vocabulary are possible and unintentional. Not all errors are caught/corrected. Please notify the [facility] if any discrepancies are noted or if the meaning of any statement is not clear.

## 2017-03-10 ENCOUNTER — Ambulatory Visit (INDEPENDENT_AMBULATORY_CARE_PROVIDER_SITE_OTHER): Payer: Self-pay | Admitting: Obstetrics & Gynecology

## 2017-03-11 ENCOUNTER — Ambulatory Visit (INDEPENDENT_AMBULATORY_CARE_PROVIDER_SITE_OTHER): Payer: Self-pay | Admitting: Obstetrics & Gynecology

## 2017-03-12 ENCOUNTER — Encounter (HOSPITAL_BASED_OUTPATIENT_CLINIC_OR_DEPARTMENT_OTHER): Payer: Self-pay

## 2017-03-12 ENCOUNTER — Ambulatory Visit (HOSPITAL_BASED_OUTPATIENT_CLINIC_OR_DEPARTMENT_OTHER)
Admission: RE | Admit: 2017-03-12 | Discharge: 2017-03-12 | Disposition: A | Discharge status: Home / Self Care | Payer: BC Managed Care – PPO | Source: Ambulatory Visit | Attending: GASTROENTEROLOGY | Admitting: GASTROENTEROLOGY

## 2017-03-12 VITALS — BP 121/73 | HR 108 | Ht 61.0 in | Wt 106.0 lb

## 2017-03-12 DIAGNOSIS — C3491 Malignant neoplasm of unspecified part of right bronchus or lung: Secondary | ICD-10-CM

## 2017-03-12 DIAGNOSIS — Z01818 Encounter for other preprocedural examination: Secondary | ICD-10-CM

## 2017-03-12 DIAGNOSIS — Z9221 Personal history of antineoplastic chemotherapy: Secondary | ICD-10-CM

## 2017-03-12 DIAGNOSIS — Z01812 Encounter for preprocedural laboratory examination: Secondary | ICD-10-CM | POA: Insufficient documentation

## 2017-03-12 DIAGNOSIS — D649 Anemia, unspecified: Secondary | ICD-10-CM

## 2017-03-12 DIAGNOSIS — R1013 Epigastric pain: Secondary | ICD-10-CM

## 2017-03-12 DIAGNOSIS — R1310 Dysphagia, unspecified: Secondary | ICD-10-CM | POA: Insufficient documentation

## 2017-03-12 DIAGNOSIS — Z01810 Encounter for preprocedural cardiovascular examination: Secondary | ICD-10-CM | POA: Insufficient documentation

## 2017-03-12 HISTORY — DX: Malignant (primary) neoplasm, unspecified (CMS HCC): C80.1

## 2017-03-12 HISTORY — DX: Dysphagia, unspecified: R13.10

## 2017-03-12 HISTORY — DX: Malignant neoplasm of unspecified part of right bronchus or lung (CMS HCC): C34.91

## 2017-03-12 HISTORY — DX: Hypothyroidism, unspecified: E03.9

## 2017-03-12 LAB — CBC WITH DIFF
BASOPHIL #: 0 x10ˆ3/uL (ref 0.00–0.10)
BASOPHIL %: 1 % (ref 0–3)
EOSINOPHIL #: 0 x10ˆ3/uL (ref 0.00–0.50)
EOSINOPHIL %: 1 % (ref 0–5)
HCT: 30.2 % — ABNORMAL LOW (ref 36.0–45.0)
HGB: 10.4 g/dL — ABNORMAL LOW (ref 12.0–15.5)
LYMPHOCYTE #: 0.5 x10ˆ3/uL — ABNORMAL LOW (ref 1.00–4.80)
LYMPHOCYTE %: 21 % (ref 15–43)
MCH: 31.7 pg (ref 27.5–33.2)
MCHC: 34.5 g/dL (ref 32.0–36.0)
MCV: 92 fL (ref 82.0–97.0)
MONOCYTE #: 0.4 x10ˆ3/uL (ref 0.20–0.90)
MONOCYTE %: 17 % — ABNORMAL HIGH (ref 5–12)
MPV: 8.3 fL (ref 7.4–10.5)
NEUTROPHIL #: 1.5 x10ˆ3/uL (ref 1.50–6.50)
NEUTROPHIL %: 61 % (ref 43–76)
PLATELETS: 214 x10ˆ3/uL (ref 150–450)
RBC: 3.28 x10ˆ6/uL — ABNORMAL LOW (ref 4.00–5.10)
RDW: 22.4 % — ABNORMAL HIGH (ref 11.0–16.0)
WBC: 2.5 10*3/uL — ABNORMAL LOW (ref 4.0–11.0)

## 2017-03-12 LAB — COMPREHENSIVE METABOLIC PANEL, NON-FASTING
ALBUMIN/GLOBULIN RATIO: 1.1 (ref 0.8–2.0)
ALBUMIN: 3 g/dL — ABNORMAL LOW (ref 3.5–5.0)
ALKALINE PHOSPHATASE: 57 U/L (ref 38–126)
ALT (SGPT): 20 U/L (ref 14–54)
ANION GAP: 9 mmol/L (ref 3–11)
AST (SGOT): 30 U/L (ref 15–41)
BILIRUBIN TOTAL: 0.3 mg/dL (ref 0.3–1.2)
BUN/CREA RATIO: 7 (ref 6–22)
BUN: 5 mg/dL — ABNORMAL LOW (ref 6–20)
CALCIUM: 8.8 mg/dL (ref 8.8–10.2)
CHLORIDE: 104 mmol/L (ref 101–111)
CO2 TOTAL: 24 mmol/L (ref 22–32)
CREATININE: 0.68 mg/dL (ref 0.44–1.00)
ESTIMATED GFR: >60 mL/min/1.73mˆ2 (ref >60)
GLUCOSE: 94 mg/dL (ref 70–110)
POTASSIUM: 3.3 mmol/L — ABNORMAL LOW (ref 3.4–5.1)
PROTEIN TOTAL: 5.7 g/dL — ABNORMAL LOW (ref 6.4–8.3)
PROTEIN TOTAL: 5.7 g/dL — ABNORMAL LOW (ref 6.4–8.3)
SODIUM: 137 mmol/L (ref 136–145)

## 2017-03-12 LAB — IRON TRANSFERRIN AND TIBC
IRON (TRANSFERRIN) SATURATION: 49 % (ref 15–50)
IRON: 110 ug/dL (ref 28–170)
TOTAL IRON BINDING CAPACITY: 224 ug/dL
TRANSFERRIN: 157 mg/dL — ABNORMAL LOW (ref 192–282)

## 2017-03-12 LAB — FOLATE
FOLATE: 7.8 ng/mL (ref >=4.5)
FOLATE: 7.8 ng/mL (ref >=4.5)

## 2017-03-12 LAB — VITAMIN B12: VITAMIN B 12: 468 pg/mL (ref 180–914)

## 2017-03-12 LAB — FERRITIN: FERRITIN: 699 ng/mL — ABNORMAL HIGH (ref 11–307)

## 2017-03-12 MED ORDER — BARIUM SULFATE 98 % ORAL POWDER FOR SUSPENSION
135.00 mL | INHALATION_SUSPENSION | ORAL | Status: AC
Start: 2017-03-12 — End: 2017-03-12
  Administered 2017-03-12: 135 mL via ORAL
  Filled 2017-03-12: qty 135

## 2017-03-12 MED ORDER — SOD BICARB-CITRIC AC-SIMETH 2.21 GRAM-1.53 GRAM/4 GRAM GRANULES EFFERV
1.00 | GRANULES | ORAL | Status: DC
Start: 2017-03-12 — End: 2017-03-12
  Filled 2017-03-12: qty 1

## 2017-03-12 MED ORDER — BARIUM SULFATE 96 % (W/W) ORAL POWDER FOR SUSPENSION
176.00 g | INHALATION_SUSPENSION | ORAL | Status: DC
Start: 2017-03-12 — End: 2017-03-12
  Filled 2017-03-12: qty 1

## 2017-03-13 LAB — ECG 12-LEAD
Atrial Rate: 96 {beats}/min
Calculated P Axis: 82 degrees
Calculated R Axis: -16 degrees
Calculated T Axis: 61 degrees
PR Interval: 180 ms
QRS Duration: 66 ms
QT Interval: 370 ms
QTC Calculation: 467 ms
Ventricular rate: 96 {beats}/min

## 2017-03-17 ENCOUNTER — Ambulatory Visit (INDEPENDENT_AMBULATORY_CARE_PROVIDER_SITE_OTHER): Payer: Self-pay | Admitting: GASTROENTEROLOGY

## 2017-03-25 ENCOUNTER — Other Ambulatory Visit (HOSPITAL_BASED_OUTPATIENT_CLINIC_OR_DEPARTMENT_OTHER): Payer: Self-pay | Admitting: Hematology & Oncology

## 2017-03-25 DIAGNOSIS — R21 Rash and other nonspecific skin eruption: Secondary | ICD-10-CM

## 2017-03-30 ENCOUNTER — Ambulatory Visit (HOSPITAL_BASED_OUTPATIENT_CLINIC_OR_DEPARTMENT_OTHER): Payer: BC Managed Care – PPO

## 2017-03-30 ENCOUNTER — Other Ambulatory Visit (HOSPITAL_BASED_OUTPATIENT_CLINIC_OR_DEPARTMENT_OTHER): Payer: Self-pay | Admitting: GASTROENTEROLOGY

## 2017-03-30 ENCOUNTER — Ambulatory Visit (HOSPITAL_BASED_OUTPATIENT_CLINIC_OR_DEPARTMENT_OTHER): Payer: BC Managed Care – PPO | Admitting: Anesthesiology

## 2017-03-30 ENCOUNTER — Ambulatory Visit (HOSPITAL_BASED_OUTPATIENT_CLINIC_OR_DEPARTMENT_OTHER): Payer: BC Managed Care – PPO | Admitting: Certified Registered"

## 2017-03-30 ENCOUNTER — Ambulatory Visit (HOSPITAL_BASED_OUTPATIENT_CLINIC_OR_DEPARTMENT_OTHER)
Admission: RE | Admit: 2017-03-30 | Discharge: 2017-03-30 | Disposition: A | Discharge status: Home / Self Care | Payer: BC Managed Care – PPO | Source: Ambulatory Visit | Attending: GASTROENTEROLOGY | Admitting: GASTROENTEROLOGY

## 2017-03-30 ENCOUNTER — Encounter (HOSPITAL_BASED_OUTPATIENT_CLINIC_OR_DEPARTMENT_OTHER): Payer: Self-pay

## 2017-03-30 ENCOUNTER — Encounter (HOSPITAL_BASED_OUTPATIENT_CLINIC_OR_DEPARTMENT_OTHER)
Admission: RE | Disposition: A | Discharge status: Home / Self Care | Payer: Self-pay | Source: Ambulatory Visit | Attending: GASTROENTEROLOGY

## 2017-03-30 DIAGNOSIS — M3500 Sicca syndrome, unspecified: Secondary | ICD-10-CM | POA: Insufficient documentation

## 2017-03-30 DIAGNOSIS — K2950 Unspecified chronic gastritis without bleeding: Secondary | ICD-10-CM | POA: Insufficient documentation

## 2017-03-30 DIAGNOSIS — R1310 Dysphagia, unspecified: Secondary | ICD-10-CM | POA: Insufficient documentation

## 2017-03-30 DIAGNOSIS — Z79890 Hormone replacement therapy (postmenopausal): Secondary | ICD-10-CM | POA: Insufficient documentation

## 2017-03-30 DIAGNOSIS — K222 Esophageal obstruction: Secondary | ICD-10-CM | POA: Insufficient documentation

## 2017-03-30 DIAGNOSIS — Z87891 Personal history of nicotine dependence: Secondary | ICD-10-CM | POA: Insufficient documentation

## 2017-03-30 DIAGNOSIS — Z9221 Personal history of antineoplastic chemotherapy: Secondary | ICD-10-CM | POA: Insufficient documentation

## 2017-03-30 DIAGNOSIS — Z923 Personal history of irradiation: Secondary | ICD-10-CM | POA: Insufficient documentation

## 2017-03-30 DIAGNOSIS — Z85118 Personal history of other malignant neoplasm of bronchus and lung: Secondary | ICD-10-CM | POA: Insufficient documentation

## 2017-03-30 DIAGNOSIS — R1013 Epigastric pain: Secondary | ICD-10-CM | POA: Insufficient documentation

## 2017-03-30 DIAGNOSIS — Z79899 Other long term (current) drug therapy: Secondary | ICD-10-CM | POA: Insufficient documentation

## 2017-03-30 DIAGNOSIS — Z86010 Personal history of colonic polyps: Secondary | ICD-10-CM | POA: Insufficient documentation

## 2017-03-30 DIAGNOSIS — E039 Hypothyroidism, unspecified: Secondary | ICD-10-CM | POA: Insufficient documentation

## 2017-03-30 SURGERY — GASTROSCOPY WITH BIOPSY
Anesthesia: IV General | Site: Mouth | Wound class: Clean Contaminated Wounds-The respiratory, GI, Genital, or urinary

## 2017-03-30 MED ORDER — FENTANYL (PF) 50 MCG/ML INJECTION SOLUTION
Freq: Once | INTRAMUSCULAR | Status: DC | PRN
Start: 2017-03-30 — End: 2017-03-30
  Administered 2017-03-30: 50 ug via INTRAVENOUS

## 2017-03-30 MED ORDER — LIDOCAINE (PF) 100 MG/5 ML (2 %) INTRAVENOUS SYRINGE
INJECTION | Freq: Once | INTRAVENOUS | Status: DC | PRN
Start: 2017-03-30 — End: 2017-03-30
  Administered 2017-03-30: 100 mg via INTRAVENOUS

## 2017-03-30 MED ORDER — PROPOFOL 10 MG/ML IV BOLUS
INJECTION | Freq: Once | INTRAVENOUS | Status: DC | PRN
Start: 2017-03-30 — End: 2017-03-30
  Administered 2017-03-30: 30 mg via INTRAVENOUS

## 2017-03-30 MED ORDER — LACTATED RINGERS INTRAVENOUS SOLUTION
INTRAVENOUS | Status: DC
Start: 2017-03-30 — End: 2017-03-30

## 2017-03-30 MED ORDER — PROPOFOL 10 MG/ML IV - CHI
INTRAVENOUS | Status: DC | PRN
Start: 2017-03-30 — End: 2017-03-30
  Administered 2017-03-30: 0 ug/kg/min via INTRAVENOUS
  Administered 2017-03-30: 150 ug/kg/min via INTRAVENOUS

## 2017-03-30 SURGICAL SUPPLY — 10 items
BLOCK BITE 60FR BLOX DISP (GENE) ×2 IMPLANT
BLOCK BITE OD60 ENDO (GENE) ×1
FORCEPS BIOPSY MICROMESH TTH STREAMLINE CATH NEEDLE 240CM 2.4MM RJ 4 SS LRG CPC STRL DISP ORNG 2.8MM (SURGICAL INSTRUMENTS) ×2 IMPLANT
FORCEPS BIOPSY NEEDLE 240CM 2._4MM RJ 4 2.8MM LRG CPC STRL (INSTRUMENTS) ×1
GOWN SURG XL AAMI L4 IMPRV REI NF BRTHBL STRL LF DISP CNVRT (PROTECTIVE PRODUCTS/GARMENTS) ×3 IMPLANT
SOLUTION IRRG H2O 500CC 2F7113 (SOLUTIONS) ×1
SYRINGE LL 30ML LF  STRL CONCEN TIP GRAD N-PYRG DEHP-FR MED DISP (NEEDLES & SYRINGE SUPPLIES) IMPLANT
SYRINGE LL 30ML LF STRL CONCE_N TIP GRAD N-PYRG DEHP-FR MED (NEEDLES & SYRINGE SUPPLIES)
USE ITEM 60432 FORCEPS BIOPSY MICROMESH TTH STREAMLINE CATH NEEDLE 240CM 2.4MM RJ 4 SS LRG CPC STRL DISP ORNG 2.8MM (SURGICAL INSTRUMENTS) ×1 IMPLANT
WATER STRL 500ML PLASTIC PR BTL LF (SOLUTIONS) ×2 IMPLANT

## 2017-03-30 NOTE — Brief Op Note (Signed)
BRIEF OPERATIVE NOTE    Patient Name: Gina Barrett, Gina Barrett Number: [id number]  Encounter number: [id number]  Date of Service: 03/30/2017   Date of Birth: 12/13/1955      Pre-Operative Diagnosis: Dysphagia     Post-Operative Diagnosis: gastritis, mild srticture esophagus,    Procedure(s)/Description:  Procedure(s):  GASTROSCOPY WITH BIOPSY    Findings: above     Attending Surgeon: Dominic Pea Artis Beggs, DO    Assistant(s):  None    Anesthesia Type: IV General    Estimated Blood Loss:  Minimal    Specimens removed  yes    Complications:  None           Disposition: Phase II           Condition: stable    Patient is at increased risk for surgical bleeding : No    Patient is on postop antibiotic regimen > 24 hours:  No    Beta Blocker:  N/A    [place], DO

## 2017-03-30 NOTE — Discharge Instructions (Signed)
[facility]  [place], [place] [address]     Anesthesia Discharge Instructions    1.  Do NOT drive an automobile today.    2.  Do NOT sign any legal documents for 24 hours.    3.  Do NOT operate any electrical equipment today.    Fall risk prevention education has been reviewed with the patient/significant other.

## 2017-03-30 NOTE — Interval H&P Note (Signed)
H&P UPDATE FORM                                                                                  Mincey,Brianna E, 61 y.o. female  Date of Admission:  (Not on file)  Date of Birth:  01/15/1956    03/30/2017    STOP: IF H&P IS GREATER THAN 30 DAYS FROM SURGICAL DAY COMPLETE NEW H&P IS REQUIRED.     H & P updated the day of the procedure.  1.  H&P completed within 30 days of surgical procedure  and has been reviewed within 24 hours of the surgery, the patient has been examined, and no change has occured in the patients condition since the H&P was completed.       Change in medications:no     No LMP recorded. Patient is postmenopausal.      Comments:     2.  Patient continues to be appropriate candidate for planned surgical procedure.yes    [place], DO

## 2017-03-30 NOTE — Anesthesia Transfer of Care (Signed)
ANESTHESIA TRANSFER OF CARE   Gina Barrett is a 61 y.o. ,female, Weight: 48.1 kg (106 lb)   had Procedure(s):  GASTROSCOPY WITH BIOPSY  performed  03/30/17   Primary Service: Dominic Pea Rahimian, *    Past Medical History:   Diagnosis Date   . Abnormal Pap smear     ASCUS, ASCUS cannot rule out HGSIL   . Cancer (CMS HCC)     non small cell lung cancer (R)-s/p Chemo/Radiation completed 8/18   . Colon polyp    . Dysphagia    . Hypothyroid    . Non-small cell lung cancer, right (CMS [facility])     s/p Chemo completed 9/18   . Raynaud's disease    . STD (sexually transmitted disease)     HSV   . Unspecified disorder of thyroid     Hypothyroidism      Allergy History as of 03/30/17     PENICILLINS       Noted Status Severity Type Reaction    08/15/10 [place] 08/15/10 Active                 I completed my transfer of care / handoff to the receiving personnel during which we discussed:  All key/critical aspects of case discussed, Analgesia, Fluids/Product and Gave opportunity for questions and acknowledgement of understanding                                              Additional Info:Transferred to [facility] recovery, report to [employer]. Pt awake.                      Last OR Temp: Temperature: 36.5 C (97.7 F)  ABG:  POTASSIUM   Date Value Ref Range Status   03/12/2017 3.3 (L) 3.4 - 5.1 mmol/L Final   11/22/2014 4.2 3.5 - 5.0 mmol/L Final     CALCIUM   Date Value Ref Range Status   03/12/2017 8.8 8.8 - 10.2 mg/dL Final   11/22/2014 9.3 8.5 - 10.5 mg/dL Final     Calculated P Axis   Date Value Ref Range Status   03/12/2017 82 degrees Final     Calculated R Axis   Date Value Ref Range Status   03/12/2017 -16 degrees Final     Calculated T Axis   Date Value Ref Range Status   03/12/2017 61 degrees Final     Airway:   Blood pressure (!) 94/59, pulse 88, temperature 36.5 C (97.7 F), resp. rate 16, height 1.549 m (5\' 1" ), weight 48.1 kg (106 lb), SpO2 100 %.

## 2017-03-30 NOTE — Nurses Notes (Signed)
Verbal and written instructions given to the patient/significant other.

## 2017-03-30 NOTE — H&P (View-Only) (Signed)
[place], [place] [id number]  [phone]    Encounter Date: 03/03/2017     Name: Gina Barrett  Age: 61 y.o.  DOB: 03/01/1956  Sex: female    Impression:   History of dysphagia mild epigastric pain status post treatment for lung cancer with radiation  Remote history of polyps up-to-date on colonoscopy  Recommendations:   Again the differential would include radiation induced esophagitis in even possibly a stricture I have advised the patient to stay away from meat products other possibilities would include infectious esophagitis versus spasm go ahead and obtain a barium swallow plan on an EGD risks benefits alternatives explained the patient agrees to proceed if any severe symptoms patient is aware to go to the emergency room  Of note as far as possible dilation again given her history of radiation this should be done only at a center with backup for thoracic surgery given high risk of perforation in this clinical scenario     Chief Complaint/HPI:   This is a pleasant 61 year old female in seen about 3 years ago for surveillance colonoscopy the colonoscopy was fine she was not having any symptoms at that time from a GI standpoint and subsequently she was diagnosed with lung cancer had treatment initially had some dysphagia that sort of got better now has a little bit of epigastric burning at times denies any chest pain or shortness of breath no vomiting appetite is fair no lower abdominal symptoms no melena or hematochezia no fevers chills or sweats pruritus jaundice headache dizziness or weakness patient is a former smoker  Risk analyst Complaint   Patient presents with   . Epigastric Pain     epig pain post chemo REFERRAL [place]        History:  Family Medical History     Problem Relation (Age of Onset)    Breast Cancer Mother    Diabetes Paternal Grandfather, Brother    No Known Problems Father, Sister, Maternal Grandmother, Maternal Grandfather, Paternal  17, Daughter, Son, Maternal Aunt, Maternal Uncle, Paternal 35, Paternal Uncle, Other            Past Medical History:   Diagnosis Date   . Abnormal Pap smear     ASCUS, ASCUS cannot rule out HGSIL   . Colon polyp    . Raynaud's disease    . STD (sexually transmitted disease)     HSV   . Unspecified disorder of thyroid     Hypothyroidism         Past Surgical History:   Procedure Laterality Date   . HX CATARACT REMOVAL Bilateral 2011   . HX COLONOSCOPY  2009   . HX ELBOW SURGERY     . HX FOOT SURGERY     . HX TUBAL LIGATION  1978   . PB FOREARM/WRIST SURGERY UNLISTED  1999    carpal tunnel release         Patient Active Problem List    Diagnosis   . Drug-induced pancytopenia (CMS HCC)   . Other neutropenia (CMS [facility])   . Malignant neoplasm of lung, unspecified laterality, unspecified part of lung (CMS HCC)   . Sjoegren syndrome (CMS HCC)     History   Smoking Status   . Former Smoker   . Packs/day: 1.00   . Years: 20.00   . Types: Cigarettes   . Start date: 12/09/1970   . Quit date: 09/08/1998   Smokeless Tobacco   .  Never Used     History   Alcohol Use No     History   Drug Use No     Current Outpatient Prescriptions   Medication Sig   . Conj Estrog-Medroxyprogest Ace 0.3-1.5 mg Oral Tablet Take 1 Tab by mouth Once a day   . cycloSPORINE (RESTASIS) 0.05 % Ophthalmic Dropperette Instill 1 Drop into both eyes Every 12 hours   . levothyroxine (SYNTHROID) 88 mcg Oral Tablet take 88 mcg by mouth Once a day.   . loratadine (CLARITIN) 10 mg Oral Tablet take 10 mg by mouth Once a day.   . Magic Mouthwash 10 mL swish and spit Every 4 hours as needed for Pain Solution contains lidocaine viscous 2%, diphenhydramine 12.5 mg/5 mL, maalox.  Mix 1:1:1   . Nifedipine (ADALAT CC) 30 mg Oral Tablet Sustained Release take 30 mg by mouth Once a day.   . OMEGA-3 FATTY ACIDS (FISH OIL ORAL) take  by mouth Once a day.   . ondansetron (ZOFRAN ODT) 4 mg Oral Tablet, Rapid Dissolve Take 1 Tab (4 mg total) by mouth Every 8 hours as  needed for nausea/vomiting (Patient not taking: Reported on 03/03/2017)   . pantoprazole (PROTONIX) 20 mg Oral Tablet, Delayed Release (E.C.) Take 1 Tab (20 mg total) by mouth Every morning before breakfast       Review of Systems:  Normal  HEENT:  No sore throat, no occular pain.  Respiratory:   No cough or SOB.  Cardiovascular: no chest pain or palpitation.  GI: See HPI  GU:  No S/S of dysuria, flank pain or urinary retention.  Neuro: No headaches, dizziness or weakness.  Hematologic: No fever, night sweats or chills.  Rheum: no joint pain.   Skin: no rash no purites no jaundice.  Constitutional: no unintentional weight loss.    Physical Exam  BP 108/74  Pulse (!) 125  Ht 1.549 m (5\' 1" )  Wt 48.1 kg (106 lb)  BMI 20.03 kg/m2  Body mass index is 20.03 kg/(m^2).  General:  Alert and oriented, no distress.  HEENT: Unremarkable, no lymphadenopathy or JVD  Neck:  Supple.  Pulmonary:  Clear to auscultation bilaterally.  Cardiovascular:  normal S1/S2, no murmur  Gastrointestinal:   Bowel sounds positive soft nontender no masses or organomegaly no ascites  Musculoskeletal:  Bilateral extremities- no cyanosis, clubbing or edema.   Skin:  no rash.  Neurologic:  Grossly non focal.    Risks:  Patient is aware and understands the below risks:  Failure to do workup and have follow up could result in missed cancer.   Risk of EGD or colonoscopy:  Bleeding and infection, 3% chance missed cancer, 15% chance missed polyps, 1 in 5000 chance of perforation which could lead to surgery and lead to permanent disability and/or death.    Labs:    Results for orders placed or performed during the hospital encounter of 02/17/17 (from the past 2016 hour(s))   CBC WITH DIFF    Collection Time: 02/17/17  3:10 PM   Result Value Ref Range    WBC 2.8 (L) 4.0 - 11.0 x10^3/uL    RBC 3.90 (L) 4.00 - 5.10 x10^6/uL    HGB 11.7 (L) 12.0 - 15.5 g/dL    HCT 34.6 (L) 36.0 - 45.0 %    MCV 88.7 82.0 - 97.0 fL    MCH 29.9 27.5 - 33.2 pg    MCHC 33.7 32.0 -  36.0 g/dL    RDW 16.1 (H)  11.0 - 16.0 %    PLATELETS 150 150 - 450 x10^3/uL    MPV 7.9 7.4 - 10.5 fL    NEUTROPHIL % 53 43 - 76 %    LYMPHOCYTE % 21 15 - 43 %    MONOCYTE % 24 (H) 5 - 12 %    EOSINOPHIL % 1 0 - 5 %    BASOPHIL % 1 0 - 3 %    NEUTROPHIL # 1.47 (L) 1.50 - 6.50 x10^3/uL    LYMPHOCYTE # 0.58 (L) 1.00 - 4.80 x10^3/uL    MONOCYTE # 0.66 0.20 - 0.90 x10^3/uL    EOSINOPHIL # 0.03 0.00 - 0.50 x10^3/uL    BASOPHIL # 0.02 0.00 - 0.10 x10^3/uL   Results for orders placed or performed in visit on 02/06/17 (from the past 2016 hour(s))   COMPREHENSIVE METABOLIC PROFILE - [facility] ONLY    Collection Time: 02/06/17  2:39 PM   Result Value Ref Range    SODIUM 136 136 - 145 mmol/L    POTASSIUM 3.4 3.4 - 5.1 mmol/L    CHLORIDE 102 101 - 111 mmol/L    CO2 TOTAL 28 22 - 32 mmol/L    ANION GAP 6 3 - 11 mmol/L    BUN 7 6 - 20 mg/dL    CREATININE 0.76 0.44 - 1.00 mg/dL    BUN/CREA RATIO 9 6 - 22    ESTIMATED GFR >60 >60 mL/min/1.14m^2    ALBUMIN 3.3 (L) 3.5 - 5.0 g/dL    CALCIUM 8.7 (L) 8.8 - 10.2 mg/dL    GLUCOSE 129 (H) 70 - 110 mg/dL    ALKALINE PHOSPHATASE 77 38 - 126 U/L    ALT (SGPT) 22 14 - 54 U/L    AST (SGOT) 29 15 - 41 U/L    BILIRUBIN TOTAL 0.3 0.3 - 1.2 mg/dL    PROTEIN TOTAL 5.7 (L) 6.4 - 8.3 g/dL    ALBUMIN/GLOBULIN RATIO 1.4 0.8 - 2.0   MAGNESIUM    Collection Time: 02/06/17  2:39 PM   Result Value Ref Range    MAGNESIUM 1.8 1.4 - 2.1 mg/dL   CBC WITH DIFF    Collection Time: 02/06/17  2:39 PM   Result Value Ref Range    WBC 7.4 4.0 - 11.0 x10^3/uL    RBC 3.97 (L) 4.00 - 5.10 x10^6/uL    HGB 12.1 12.0 - 15.5 g/dL    HCT 34.9 (L) 36.0 - 45.0 %    MCV 87.7 82.0 - 97.0 fL    MCH 30.4 27.5 - 33.2 pg    MCHC 34.7 32.0 - 36.0 g/dL    RDW 16.9 (H) 11.0 - 16.0 %    PLATELETS 207 150 - 450 x10^3/uL    MPV 9.0 7.4 - 10.5 fL   MANUAL DIFFERENTIAL    Collection Time: 02/06/17  2:39 PM   Result Value Ref Range    NEUTROPHIL % 73 43 - 76 %    LYMPHOCYTE % 4 (L) 15 - 43 %    MONOCYTE % 2 (L) 5 - 12 %    BAND % 19 (H) 0 - 4 %     METAMYELOCYTE % 1 0 - 1 %    MYELOCYTE % 1 (H) 0 - 0 %    NEUTROPHIL ABSOLUTE 6.81 (H) 1.50 - 6.50 x10^3/uL    LYMPHOCYTE ABSOLUTE 0.30 (L) 1.00 - 4.80 x10^3/uL    MONOCYTE ABSOLUTE 0.15 (L) 0.20 - 0.90 x10^3/uL    METAMYELOCYTE ABSOLUTE 0.04  x10^3/uL    MYELOCYTE ABSOLUTE 0.04 x10^3/uL    PLATELET ESTIMATE Adequate     ANISOCYTOSIS Slight     ECHINOCYTE (BURR CELL) Marked     WBC MORPHOLOGY COMMENT Normal     WBC 7.4 x10^3/uL   Results for orders placed or performed during the hospital encounter of 02/02/17 (from the past 2016 hour(s))   CBC WITH DIFF    Collection Time: 02/02/17 10:15 AM   Result Value Ref Range    WBC 1.6 (LL) 4.0 - 11.0 x10^3/uL    RBC 4.08 4.00 - 5.10 x10^6/uL    HGB 12.1 12.0 - 15.5 g/dL    HCT 36.1 36.0 - 45.0 %    MCV 88.4 82.0 - 97.0 fL    MCH 29.7 27.5 - 33.2 pg    MCHC 33.6 32.0 - 36.0 g/dL    RDW 15.0 11.0 - 16.0 %    PLATELETS 258 150 - 450 x10^3/uL    MPV 7.7 7.4 - 10.5 fL    NEUTROPHIL % 82 (H) 43 - 76 %    LYMPHOCYTE % 13 (L) 15 - 43 %    MONOCYTE % 3 (L) 5 - 12 %    EOSINOPHIL % 2 0 - 5 %    BASOPHIL % 0 0 - 3 %    NEUTROPHIL # 1.29 (L) 1.50 - 6.50 x10^3/uL    LYMPHOCYTE # 0.20 (L) 1.00 - 4.80 x10^3/uL    MONOCYTE # 0.05 (L) 0.20 - 0.90 x10^3/uL    EOSINOPHIL # 0.03 0.00 - 0.50 x10^3/uL    BASOPHIL # 0.00 0.00 - 0.10 x10^3/uL   Results for orders placed or performed in visit on 01/30/17 (from the past 2016 hour(s))   COMPREHENSIVE METABOLIC PROFILE - [facility] ONLY    Collection Time: 01/30/17  2:41 PM   Result Value Ref Range    SODIUM 132 (L) 136 - 145 mmol/L    POTASSIUM 3.5 3.4 - 5.1 mmol/L    CHLORIDE 100 (L) 101 - 111 mmol/L    CO2 TOTAL 25 22 - 32 mmol/L    ANION GAP 7 3 - 11 mmol/L    BUN 6 6 - 20 mg/dL    CREATININE 0.64 0.44 - 1.00 mg/dL    BUN/CREA RATIO 9 6 - 22    ESTIMATED GFR >60 >60 mL/min/1.69m^2    ALBUMIN 3.1 (L) 3.5 - 5.0 g/dL    CALCIUM 8.7 (L) 8.8 - 10.2 mg/dL    GLUCOSE 130 (H) 70 - 110 mg/dL    ALKALINE PHOSPHATASE 58 38 - 126 U/L    ALT (SGPT) 20 14 - 54  U/L    AST (SGOT) 27 15 - 41 U/L    BILIRUBIN TOTAL 0.1 (L) 0.3 - 1.2 mg/dL    PROTEIN TOTAL 5.5 (L) 6.4 - 8.3 g/dL    ALBUMIN/GLOBULIN RATIO 1.3 0.8 - 2.0   MAGNESIUM    Collection Time: 01/30/17  2:41 PM   Result Value Ref Range    MAGNESIUM 1.9 1.4 - 2.1 mg/dL   CBC WITH DIFF    Collection Time: 01/30/17  2:41 PM   Result Value Ref Range    WBC 1.4 (LL) 4.0 - 11.0 x10^3/uL    RBC 3.99 (L) 4.00 - 5.10 x10^6/uL    HGB 12.1 12.0 - 15.5 g/dL    HCT 34.7 (L) 36.0 - 45.0 %    MCV 87.1 82.0 - 97.0 fL    MCH 30.3 27.5 - 33.2 pg  MCHC 34.8 32.0 - 36.0 g/dL    RDW 15.8 11.0 - 16.0 %    PLATELETS 241 150 - 450 x10^3/uL    MPV 8.2 7.4 - 10.5 fL    NEUTROPHIL % 73 43 - 76 %    LYMPHOCYTE % 9 (L) 15 - 43 %    MONOCYTE % 18 (H) 5 - 12 %    EOSINOPHIL % 0 0 - 5 %    BASOPHIL % 1 0 - 3 %    NEUTROPHIL # 1.00 (L) 1.50 - 6.50 x10^3/uL    LYMPHOCYTE # 0.10 (L) 1.00 - 4.80 x10^3/uL    MONOCYTE # 0.20 0.20 - 0.90 x10^3/uL    EOSINOPHIL # 0.00 0.00 - 0.50 x10^3/uL    BASOPHIL # 0.00 0.00 - 0.10 x10^3/uL   Results for orders placed or performed in visit on 01/23/17 (from the past 2016 hour(s))   COMPREHENSIVE METABOLIC PROFILE - [facility] ONLY    Collection Time: 01/23/17  2:54 PM   Result Value Ref Range    SODIUM 134 (L) 136 - 145 mmol/L    POTASSIUM 4.2 3.4 - 5.1 mmol/L    CHLORIDE 99 (L) 101 - 111 mmol/L    CO2 TOTAL 26 22 - 32 mmol/L    ANION GAP 9 3 - 11 mmol/L    BUN 7 6 - 20 mg/dL    CREATININE 0.74 0.44 - 1.00 mg/dL    BUN/CREA RATIO 9 6 - 22    ESTIMATED GFR >60 >60 mL/min/1.60m^2    ALBUMIN 3.6 3.5 - 5.0 g/dL    CALCIUM 9.0 8.8 - 10.2 mg/dL    GLUCOSE 101 70 - 110 mg/dL    ALKALINE PHOSPHATASE 64 38 - 126 U/L    ALT (SGPT) 15 14 - 54 U/L    AST (SGOT) 21 15 - 41 U/L    BILIRUBIN TOTAL 0.5 0.3 - 1.2 mg/dL    PROTEIN TOTAL 6.2 (L) 6.4 - 8.3 g/dL    ALBUMIN/GLOBULIN RATIO 1.4 0.8 - 2.0   MAGNESIUM    Collection Time: 01/23/17  2:54 PM   Result Value Ref Range    MAGNESIUM 2.1 1.4 - 2.1 mg/dL   CBC WITH DIFF    Collection Time:  01/23/17  2:54 PM   Result Value Ref Range    WBC 1.7 (LL) 4.0 - 11.0 x10^3/uL    RBC 4.19 4.00 - 5.10 x10^6/uL    HGB 12.7 12.0 - 15.5 g/dL    HCT 36.3 36.0 - 45.0 %    MCV 86.6 82.0 - 97.0 fL    MCH 30.2 27.5 - 33.2 pg    MCHC 34.9 32.0 - 36.0 g/dL    RDW 15.2 11.0 - 16.0 %    PLATELETS 198 150 - 450 x10^3/uL    MPV 8.3 7.4 - 10.5 fL    NEUTROPHIL % 79 (H) 43 - 76 %    LYMPHOCYTE % 8 (L) 15 - 43 %    MONOCYTE % 12 5 - 12 %    EOSINOPHIL % 0 0 - 5 %    BASOPHIL % 1 0 - 3 %    NEUTROPHIL # 1.30 (L) 1.50 - 6.50 x10^3/uL    LYMPHOCYTE # 0.10 (L) 1.00 - 4.80 x10^3/uL    MONOCYTE # 0.20 0.20 - 0.90 x10^3/uL    EOSINOPHIL # 0.00 0.00 - 0.50 x10^3/uL    BASOPHIL # 0.00 0.00 - 0.10 x10^3/uL   Results for orders placed or performed in  visit on 01/16/17 (from the past 2016 hour(s))   COMPREHENSIVE METABOLIC PROFILE - [facility] ONLY    Collection Time: 01/16/17  2:33 PM   Result Value Ref Range    SODIUM 133 (L) 136 - 145 mmol/L    POTASSIUM 4.1 3.4 - 5.1 mmol/L    CHLORIDE 101 101 - 111 mmol/L    CO2 TOTAL 25 22 - 32 mmol/L    ANION GAP 7 3 - 11 mmol/L    BUN 9 6 - 20 mg/dL    CREATININE 0.66 0.44 - 1.00 mg/dL    BUN/CREA RATIO 14 6 - 22    ESTIMATED GFR >60 >60 mL/min/1.32m^2    ALBUMIN 3.5 3.5 - 5.0 g/dL    CALCIUM 8.8 8.8 - 10.2 mg/dL    GLUCOSE 104 70 - 110 mg/dL    ALKALINE PHOSPHATASE 61 38 - 126 U/L    ALT (SGPT) 16 14 - 54 U/L    AST (SGOT) 18 15 - 41 U/L    BILIRUBIN TOTAL 0.4 0.3 - 1.2 mg/dL    PROTEIN TOTAL 6.2 (L) 6.4 - 8.3 g/dL    ALBUMIN/GLOBULIN RATIO 1.3 0.8 - 2.0   MAGNESIUM    Collection Time: 01/16/17  2:33 PM   Result Value Ref Range    MAGNESIUM 1.9 1.4 - 2.1 mg/dL   CBC WITH DIFF    Collection Time: 01/16/17  2:33 PM   Result Value Ref Range    WBC 1.6 (LL) 4.0 - 11.0 x10^3/uL    RBC 4.19 4.00 - 5.10 x10^6/uL    HGB 12.5 12.0 - 15.5 g/dL    HCT 36.3 36.0 - 45.0 %    MCV 86.7 82.0 - 97.0 fL    MCH 29.7 27.5 - 33.2 pg    MCHC 34.3 32.0 - 36.0 g/dL    RDW 14.3 11.0 - 16.0 %    PLATELETS 152 150 - 450  x10^3/uL    MPV 8.6 7.4 - 10.5 fL    NEUTROPHIL % 72 43 - 76 %    LYMPHOCYTE % 11 (L) 15 - 43 %    MONOCYTE % 16 (H) 5 - 12 %    EOSINOPHIL % 0 0 - 5 %    BASOPHIL % 1 0 - 3 %    NEUTROPHIL # 1.20 (L) 1.50 - 6.50 x10^3/uL    LYMPHOCYTE # 0.20 (L) 1.00 - 4.80 x10^3/uL    MONOCYTE # 0.30 0.20 - 0.90 x10^3/uL    EOSINOPHIL # 0.00 0.00 - 0.50 x10^3/uL    BASOPHIL # 0.00 0.00 - 0.10 x10^3/uL   Results for orders placed or performed in visit on 01/09/17 (from the past 2016 hour(s))   COMPREHENSIVE METABOLIC PROFILE - [facility] ONLY    Collection Time: 01/09/17  2:28 PM   Result Value Ref Range    SODIUM 135 (L) 136 - 145 mmol/L    POTASSIUM 4.1 3.4 - 5.1 mmol/L    CHLORIDE 102 101 - 111 mmol/L    CO2 TOTAL 25 22 - 32 mmol/L    ANION GAP 8 3 - 11 mmol/L    BUN 10 6 - 20 mg/dL    CREATININE 0.76 0.44 - 1.00 mg/dL    BUN/CREA RATIO 13 6 - 22    ESTIMATED GFR >60 >60 mL/min/1.72m^2    ALBUMIN 3.5 3.5 - 5.0 g/dL    CALCIUM 9.0 8.8 - 10.2 mg/dL    GLUCOSE 99 70 - 110 mg/dL    ALKALINE PHOSPHATASE 60  38 - 126 U/L    ALT (SGPT) 17 14 - 54 U/L    AST (SGOT) 20 15 - 41 U/L    BILIRUBIN TOTAL 0.6 0.3 - 1.2 mg/dL    PROTEIN TOTAL 6.2 (L) 6.4 - 8.3 g/dL    ALBUMIN/GLOBULIN RATIO 1.3 0.8 - 2.0   MAGNESIUM    Collection Time: 01/09/17  2:28 PM   Result Value Ref Range    MAGNESIUM 1.9 1.4 - 2.1 mg/dL   CBC WITH DIFF    Collection Time: 01/09/17  2:28 PM   Result Value Ref Range    WBC 2.1 (L) 4.0 - 11.0 x10^3/uL    RBC 4.23 4.00 - 5.10 x10^6/uL    HGB 12.3 12.0 - 15.5 g/dL    HCT 36.5 36.0 - 45.0 %    MCV 86.3 82.0 - 97.0 fL    MCH 29.2 27.5 - 33.2 pg    MCHC 33.8 32.0 - 36.0 g/dL    RDW 14.2 11.0 - 16.0 %    PLATELETS 191 150 - 450 x10^3/uL    MPV 8.9 7.4 - 10.5 fL    NEUTROPHIL % 76 43 - 76 %    LYMPHOCYTE % 13 (L) 15 - 43 %    MONOCYTE % 9 5 - 12 %    EOSINOPHIL % 1 0 - 5 %    BASOPHIL % 1 0 - 3 %    NEUTROPHIL # 1.60 1.50 - 6.50 x10^3/uL    LYMPHOCYTE # 0.30 (L) 1.00 - 4.80 x10^3/uL    MONOCYTE # 0.20 0.20 - 0.90 x10^3/uL     EOSINOPHIL # 0.00 0.00 - 0.50 x10^3/uL    BASOPHIL # 0.00 0.00 - 0.10 x10^3/uL   Results for orders placed or performed in visit on 01/03/17 (from the past 2016 hour(s))   COMPREHENSIVE METABOLIC PROFILE - [facility] ONLY    Collection Time: 01/03/17  9:32 AM   Result Value Ref Range    SODIUM 138 136 - 145 mmol/L    POTASSIUM 4.4 3.4 - 5.1 mmol/L    CHLORIDE 104 101 - 111 mmol/L    CO2 TOTAL 26 22 - 32 mmol/L    ANION GAP 8 3 - 11 mmol/L    BUN 9 6 - 20 mg/dL    CREATININE 0.64 0.44 - 1.00 mg/dL    BUN/CREA RATIO 14 6 - 22    ESTIMATED GFR >60 >60 mL/min/1.30m^2    ALBUMIN 3.5 3.5 - 5.0 g/dL    CALCIUM 9.1 8.8 - 10.2 mg/dL    GLUCOSE 94 70 - 110 mg/dL    ALKALINE PHOSPHATASE 57 38 - 126 U/L    ALT (SGPT) 18 14 - 54 U/L    AST (SGOT) 21 15 - 41 U/L    BILIRUBIN TOTAL 0.4 0.3 - 1.2 mg/dL    PROTEIN TOTAL 6.6 6.4 - 8.3 g/dL    ALBUMIN/GLOBULIN RATIO 1.1 0.8 - 2.0   MAGNESIUM    Collection Time: 01/03/17  9:32 AM   Result Value Ref Range    MAGNESIUM 1.9 1.4 - 2.1 mg/dL   CBC WITH DIFF    Collection Time: 01/03/17  9:32 AM   Result Value Ref Range    WBC 1.8 (LL) 4.0 - 11.0 x10^3/uL    RBC 4.33 4.00 - 5.10 x10^6/uL    HGB 12.5 12.0 - 15.5 g/dL    HCT 37.7 36.0 - 45.0 %    MCV 87.0 82.0 - 97.0 fL    MCH  28.9 27.5 - 33.2 pg    MCHC 33.3 32.0 - 36.0 g/dL    RDW 13.9 11.0 - 16.0 %    PLATELETS 215 150 - 450 x10^3/uL    MPV 8.7 7.4 - 10.5 fL   MANUAL DIFFERENTIAL    Collection Time: 01/03/17  9:32 AM   Result Value Ref Range    NEUTROPHIL % 68 43 - 76 %    LYMPHOCYTE % 21 15 - 43 %    MONOCYTE % 6 5 - 12 %    EOSINOPHIL % 3 0 - 5 %    BASOPHIL % 1 0 - 3 %    BAND % 1 0 - 4 %    NEUTROPHIL ABSOLUTE 1.24 (L) 1.50 - 6.50 x10^3/uL    LYMPHOCYTE ABSOLUTE 0.38 (L) 1.00 - 4.80 x10^3/uL    MONOCYTE ABSOLUTE 0.11 (L) 0.20 - 0.90 x10^3/uL    EOSINOPHIL ABSOLUTE 0.05 0.00 - 0.50 x10^3/uL    BASOPHIL ABSOLUTE 0.02 0.00 - 0.10 x10^3/uL    PLATELET ESTIMATE Adequate     RBC MORPHOLOGY COMMENT Normal     WBC MORPHOLOGY COMMENT Normal     WBC  1.8 x10^3/uL   Results for orders placed or performed in visit on 12/26/16 (from the past 2016 hour(s))   CBC W/AUTO DIFF - [facility] ONLY    Collection Time: 12/26/16  2:28 PM   Result Value Ref Range    WBC 3.8 (L) 4.0 - 11.0 x10^3/uL    RBC 4.53 4.00 - 5.10 x10^6/uL    HGB 13.0 12.0 - 15.5 g/dL    HCT 39.6 36.0 - 45.0 %    MCV 87.4 82.0 - 97.0 fL    MCH 28.8 27.5 - 33.2 pg    MCHC 32.9 32.0 - 36.0 g/dL    RDW 14.2 11.0 - 16.0 %    PLATELETS 321 150 - 450 x10^3/uL    MPV 9.3 7.4 - 10.5 fL    NEUTROPHIL % 73 43 - 76 %    LYMPHOCYTE % 21 15 - 43 %    MONOCYTE % 5 5 - 12 %    EOSINOPHIL % 1 0 - 5 %    BASOPHIL % 1 0 - 3 %    NEUTROPHIL # 2.70 1.50 - 6.50 x10^3/uL    LYMPHOCYTE # 0.80 (L) 1.00 - 4.80 x10^3/uL    MONOCYTE # 0.20 0.20 - 0.90 x10^3/uL    EOSINOPHIL # 0.00 0.00 - 0.50 x10^3/uL    BASOPHIL # 0.00 0.00 - 0.10 x10^3/uL   COMPREHENSIVE METABOLIC PROFILE - [facility] ONLY    Collection Time: 12/26/16  2:28 PM   Result Value Ref Range    SODIUM 137 136 - 145 mmol/L    POTASSIUM 4.2 3.4 - 5.1 mmol/L    CHLORIDE 103 101 - 111 mmol/L    CO2 TOTAL 26 22 - 32 mmol/L    ANION GAP 8 3 - 11 mmol/L    BUN 9 6 - 20 mg/dL    CREATININE 0.69 0.44 - 1.00 mg/dL    BUN/CREA RATIO 13 6 - 22    ESTIMATED GFR >60 >60 mL/min/1.40m^2    ALBUMIN 3.4 (L) 3.5 - 5.0 g/dL    CALCIUM 9.3 8.8 - 10.2 mg/dL    GLUCOSE 103 70 - 110 mg/dL    ALKALINE PHOSPHATASE 57 38 - 126 U/L    ALT (SGPT) 17 14 - 54 U/L    AST (SGOT) 22 15 - 41 U/L  BILIRUBIN TOTAL 0.5 0.3 - 1.2 mg/dL    PROTEIN TOTAL 6.6 6.4 - 8.3 g/dL    ALBUMIN/GLOBULIN RATIO 1.1 0.8 - 2.0   MAGNESIUM    Collection Time: 12/26/16  2:28 PM   Result Value Ref Range    MAGNESIUM 2.0 1.4 - 2.1 mg/dL   Results for orders placed or performed during the hospital encounter of 12/22/16 (from the past 2016 hour(s))   CBC W/AUTO DIFF - [facility] ONLY    Collection Time: 12/22/16  1:27 PM   Result Value Ref Range    WBC 4.6 4.0 - 11.0 x10^3/uL    RBC 4.39 4.00 - 5.10 x10^6/uL    HGB 12.7 12.0 - 15.5 g/dL     HCT 37.8 36.0 - 45.0 %    MCV 86.2 82.0 - 97.0 fL    MCH 29.0 27.5 - 33.2 pg    MCHC 33.7 32.0 - 36.0 g/dL    RDW 14.2 11.0 - 16.0 %    PLATELETS 305 150 - 450 x10^3/uL    MPV 8.0 7.4 - 10.5 fL    NEUTROPHIL % 80 (H) 43 - 76 %    LYMPHOCYTE % 17 15 - 43 %    MONOCYTE % 1 (L) 5 - 12 %    EOSINOPHIL % 1 0 - 5 %    BASOPHIL % 0 0 - 3 %    NEUTROPHIL # 3.67 1.50 - 6.50 x10^3/uL    LYMPHOCYTE # 0.78 (L) 1.00 - 4.80 x10^3/uL    MONOCYTE # 0.06 (L) 0.20 - 0.90 x10^3/uL    EOSINOPHIL # 0.05 0.00 - 0.50 x10^3/uL    BASOPHIL # 0.01 0.00 - 0.10 x10^3/uL   COMPREHENSIVE METABOLIC PROFILE - [facility] ONLY    Collection Time: 12/22/16  1:27 PM   Result Value Ref Range    SODIUM 136 136 - 145 mmol/L    POTASSIUM 4.2 3.4 - 5.1 mmol/L    CHLORIDE 104 101 - 111 mmol/L    CO2 TOTAL 25 22 - 32 mmol/L    ANION GAP 7 3 - 11 mmol/L    BUN 11 6 - 20 mg/dL    CREATININE 0.68 0.44 - 1.00 mg/dL    BUN/CREA RATIO 16 6 - 22    ESTIMATED GFR >60 >60 mL/min/1.38m^2    ALBUMIN 3.6 3.5 - 5.0 g/dL    CALCIUM 9.4 8.8 - 10.2 mg/dL    GLUCOSE 146 (H) 70 - 110 mg/dL    ALKALINE PHOSPHATASE 64 38 - 126 U/L    ALT (SGPT) 16 14 - 54 U/L    AST (SGOT) 20 15 - 41 U/L    BILIRUBIN TOTAL 0.3 0.3 - 1.2 mg/dL    PROTEIN TOTAL 7.1 6.4 - 8.3 g/dL    ALBUMIN/GLOBULIN RATIO 1.0 0.8 - 2.0   MAGNESIUM    Collection Time: 12/22/16  1:27 PM   Result Value Ref Range    MAGNESIUM 2.0 1.4 - 2.1 mg/dL        Assessment and Plan       ICD-10-CM    1. Abdominal pain, epigastric R10.13 CBC/DIFF (AUTOMATED)     COMPREHENSIVE METABOLIC PANEL, NON-FASTING     UPPER ENDOSCOPY     FLUORO ESOPHAGRAM (BA SWALLOW)   2. Dysphagia R13.10 CBC/DIFF (AUTOMATED)     COMPREHENSIVE METABOLIC PANEL, NON-FASTING     UPPER ENDOSCOPY     FLUORO ESOPHAGRAM (BA SWALLOW)     Return in 3 months (on 06/02/2017).  Alta Sierra, DO 03/03/2017, 20:14

## 2017-03-30 NOTE — Nurses Notes (Signed)
RN can lock up belongings while in surgery, patient understands any missing belongings are patients responsibility. Patient instructed to please send any valuable items with family/visitor to ensure no lost or stolen items.

## 2017-03-30 NOTE — Anesthesia Preprocedure Evaluation (Signed)
ANESTHESIA PRE-OP EVALUATION  Planned Procedure: GASTROSCOPY WITH BIOPSY (N/A )  GASTROSCOPY WITH DILATION (Left Mouth)  Review of Systems         patient summary reviewed  nursing notes reviewed        Pulmonary     Cardiovascular    No peripheral edema,        GI/Hepatic/Renal        Endo/Other          Neuro/Psych/MS       Cancer                 Physical Assessment      Patient summary reviewed and Nursing notes reviewed   Airway       Mallampati: III    TM distance: >3 FB    Neck ROM: full  Mouth Opening: poor.            Dental           (+) upper dentures, lower dentures           Pulmonary    Breath sounds clear to auscultation  (-) no rhonchi, no decreased breath sounds, no wheezes, no rales and no stridor     Cardiovascular    Rhythm: regular  Rate: Normal  (-) no friction rub, carotid bruit is not present, no peripheral edema and no murmur     Other findings            Plan  Planned anesthesia type: IV general    ASA 2     Intravenous induction     Anesthetic plan and risks discussed with patient.     Anesthesia issues/risks discussed are: Stroke, Post-op Pain Management, PONV and Cardiac Events/MI.        Patient's NPO status is appropriate for Anesthesia.                   Activity Tolerance : >4 METs  EKG: Sinus  Plan: IVGA

## 2017-03-31 NOTE — OR Surgeon (Signed)
PATIENT NAME: Langlois, Westby NUMBER:  [id number]  DATE OF SERVICE: 03/30/2017  DATE OF BIRTH:  [birth year]    OPERATIVE REPORT    PREOPERATIVE DIAGNOSES:  1. History of dysphagia.   2. History of lung cancer and radiation treatment.    POSTOPERATIVE DIAGNOSES:  1. 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  2. No active esophagitis.  No evidence of any fungal infection or cytomegalovirus or herpes simplex virus infection.  3. Gastritis.  4. Normal duodenum.    RECOMMENDATIONS:  Await biopsy results.  I would avoid meat products and, in general, should chew her food slowly, stay on a soft diet.  Will limit aspirin and nonsteroidals.  Continue proton pump inhibitor.  If dysphagia persists despite the above, given that this is a radiation induced stricture, this is a high risk type of stricture to dilate, this should be done at a tertiary center with surgical backup.  Also note, stay on a full liquid diet here after the procedure for 72 hours.  She is to notify us if she is not improved with the above dietary measures.    SURGEON:  Deauna Yaw, DO    NAME OF PROCEDURE:  Esophagogastroduodenoscopy with biopsy.    ANESTHESIA:  Per anesthesiology.    DESCRIPTION OF PROCEDURE:  After informed consent was obtained explaining the risks of the procedure including death, perforation, bleeding, infection, aspiration and missed cancer, the patient understood this and agreed to the procedure.  After adequate sedation was achieved, oral bite block was in place, the Olympus endoscope was advanced under direct visualization and the scope was very slowly advanced through the length of the esophagus.  There was no significant resistance.  No overt stricture, but the esophagogastric junction was at roughly 37 and 38 cm.  The scope was entered slowly along the lesser curvature towards the antrum, through the pylorus and into the duodenum carefully.  The bulb, sweep, second portion  of the duodenum were normal.  The scope was brought back towards the antrum.  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  Biopsies were obtained.  Again, I retroflexed.  The cardia, fundus, body and incisure were otherwise normal.  The scope was straightened out and brought out of the stomach, back towards the esophagus, and I slowly brought the scope out of the esophagus.  At this point, I did feel a little bit of resistance and the scope was very slowly brought out and in the mid esophagus.  Again the esophagogastric junction was roughly 37-38 cm, and again at roughly 27-28 cm there was what felt to be a very mild stricture, but radiation induced, and the scope was removed slowly past this area and there were a couple of very superficial lacerations.  Multiple photographs were taken.  There were no deep tears or rents, and at this point I opted not to do any biopsies.  Again, the area was smooth and symmetrical.  No nodules, ulcers, or plaques.  No evidence of any infection.  I opted not to do any biopsies due to the history of radiation and I did not feel comfortable passing a dilator through this area as the scope alone caused this superficial laceration.  Photographs were taken.  The scope was removed.  The patient tolerated the procedure well with no immediate complications.        [place], DO              CC:  Beryle Flock, MD   [address]   [place], [place] [address]     [facility], [address] [address]   [place],  [address]       DD:  03/30/2017 14:50:59  DT:  03/31/2017 07:37:38 KM  D#:  [id number]

## 2017-03-31 NOTE — Anesthesia Postprocedure Evaluation (Signed)
Anesthesia Post Op Evaluation    Patient: Gina Barrett  Procedure(s):  GASTROSCOPY WITH BIOPSY    Last Vitals:Temperature: 36.5 C (97.7 F) (03/30/17 1314)  Heart Rate: 82 (03/30/17 1455)  BP (Non-Invasive): 123/67 (03/30/17 1455)  Respiratory Rate: 16 (03/30/17 1455)  SpO2-1: 100 % (03/30/17 1455)  Pain Score (Numeric, Faces): 0 (03/30/17 1500)         Anesthetic complications: no      Comments: Discharged by criteria per RN.

## 2017-04-01 LAB — HISTORICAL SURGICAL PATHOLOGY SPECIMEN

## 2017-04-02 NOTE — Progress Notes (Signed)
Called patient made aware of biopsy results. Is not having any issues at this time will call if she needs to see tertiary center.TLC

## 2017-04-06 ENCOUNTER — Inpatient Hospital Stay
Admission: RE | Admit: 2017-04-06 | Discharge: 2017-04-06 | Disposition: A | Discharge status: Still In Hospital | Payer: BC Managed Care – PPO | Source: Ambulatory Visit | Attending: Hematology & Oncology | Admitting: Hematology & Oncology

## 2017-04-06 ENCOUNTER — Inpatient Hospital Stay (HOSPITAL_BASED_OUTPATIENT_CLINIC_OR_DEPARTMENT_OTHER): Payer: BC Managed Care – PPO | Attending: Hematology & Oncology | Admitting: Hematology & Oncology

## 2017-04-06 ENCOUNTER — Encounter (HOSPITAL_BASED_OUTPATIENT_CLINIC_OR_DEPARTMENT_OTHER): Payer: Self-pay | Admitting: Hematology & Oncology

## 2017-04-06 VITALS — BP 102/69 | HR 115 | Temp 97.6°F | Resp 24 | Ht 60.75 in | Wt 108.4 lb

## 2017-04-06 DIAGNOSIS — K2970 Gastritis, unspecified, without bleeding: Secondary | ICD-10-CM

## 2017-04-06 DIAGNOSIS — Z85118 Personal history of other malignant neoplasm of bronchus and lung: Secondary | ICD-10-CM | POA: Insufficient documentation

## 2017-04-06 DIAGNOSIS — E039 Hypothyroidism, unspecified: Secondary | ICD-10-CM | POA: Insufficient documentation

## 2017-04-06 DIAGNOSIS — C3490 Malignant neoplasm of unspecified part of unspecified bronchus or lung: Secondary | ICD-10-CM

## 2017-04-06 DIAGNOSIS — Z79899 Other long term (current) drug therapy: Secondary | ICD-10-CM | POA: Insufficient documentation

## 2017-04-06 DIAGNOSIS — Z95828 Presence of other vascular implants and grafts: Secondary | ICD-10-CM

## 2017-04-06 DIAGNOSIS — Z86010 Personal history of colonic polyps: Secondary | ICD-10-CM | POA: Insufficient documentation

## 2017-04-06 DIAGNOSIS — R1013 Epigastric pain: Secondary | ICD-10-CM | POA: Insufficient documentation

## 2017-04-06 DIAGNOSIS — Z79890 Hormone replacement therapy (postmenopausal): Secondary | ICD-10-CM | POA: Insufficient documentation

## 2017-04-06 MED ORDER — SODIUM CHLORIDE 0.9 % (FLUSH) INJECTION SYRINGE
10.0000 mL | INJECTION | Freq: Once | INTRAMUSCULAR | Status: AC
Start: 2017-04-06 — End: 2017-04-06
  Administered 2017-04-06: 10 mL

## 2017-04-06 MED ORDER — HEPARIN, PORCINE (PF) 100 UNIT/ML INTRAVENOUS SYRINGE
5.0000 mL | INJECTION | Freq: Once | INTRAVENOUS | Status: AC
Start: 2017-04-06 — End: 2017-04-06
  Administered 2017-04-06: 5 mL
  Filled 2017-04-06: qty 5

## 2017-04-06 NOTE — Nurses Notes (Signed)
Port flushed, no concerns.     Patient discharged home.  AVS reviewed with patient.  A written copy of the AVS and discharge instructions was given to the patient.  Questions sufficiently answered as needed.  Patient encouraged to follow up with PCP as indicated.  In the event of an emergency, patient instructed to call 911 or go to the nearest emergency room.

## 2017-04-06 NOTE — Discharge Instructions (Signed)

## 2017-05-01 ENCOUNTER — Other Ambulatory Visit (HOSPITAL_BASED_OUTPATIENT_CLINIC_OR_DEPARTMENT_OTHER): Payer: Self-pay | Admitting: Radiology

## 2017-05-01 ENCOUNTER — Other Ambulatory Visit (HOSPITAL_COMMUNITY): Payer: Self-pay | Admitting: Radiology

## 2017-05-01 DIAGNOSIS — C3411 Malignant neoplasm of upper lobe, right bronchus or lung: Secondary | ICD-10-CM

## 2017-05-02 ENCOUNTER — Ambulatory Visit (HOSPITAL_BASED_OUTPATIENT_CLINIC_OR_DEPARTMENT_OTHER)
Admission: RE | Admit: 2017-05-02 | Discharge: 2017-05-02 | Disposition: A | Discharge status: Home / Self Care | Payer: BC Managed Care – PPO | Source: Ambulatory Visit | Attending: Radiology | Admitting: Radiology

## 2017-05-02 DIAGNOSIS — J8410 Pulmonary fibrosis, unspecified: Secondary | ICD-10-CM | POA: Insufficient documentation

## 2017-05-02 DIAGNOSIS — J984 Other disorders of lung: Secondary | ICD-10-CM | POA: Insufficient documentation

## 2017-05-02 DIAGNOSIS — Y842 Radiological procedure and radiotherapy as the cause of abnormal reaction of the patient, or of later complication, without mention of misadventure at the time of the procedure: Secondary | ICD-10-CM | POA: Insufficient documentation

## 2017-05-02 DIAGNOSIS — C3411 Malignant neoplasm of upper lobe, right bronchus or lung: Secondary | ICD-10-CM | POA: Insufficient documentation

## 2017-05-02 DIAGNOSIS — R911 Solitary pulmonary nodule: Secondary | ICD-10-CM | POA: Insufficient documentation

## 2017-05-02 DIAGNOSIS — J700 Acute pulmonary manifestations due to radiation: Secondary | ICD-10-CM | POA: Insufficient documentation

## 2017-05-04 ENCOUNTER — Inpatient Hospital Stay
Admission: RE | Admit: 2017-05-04 | Discharge: 2017-05-04 | Disposition: A | Discharge status: Still In Hospital | Payer: BC Managed Care – PPO | Source: Ambulatory Visit | Attending: Hematology & Oncology | Admitting: Hematology & Oncology

## 2017-05-04 DIAGNOSIS — C3490 Malignant neoplasm of unspecified part of unspecified bronchus or lung: Secondary | ICD-10-CM | POA: Insufficient documentation

## 2017-05-04 DIAGNOSIS — Z452 Encounter for adjustment and management of vascular access device: Secondary | ICD-10-CM | POA: Insufficient documentation

## 2017-05-04 DIAGNOSIS — Z95828 Presence of other vascular implants and grafts: Secondary | ICD-10-CM

## 2017-05-04 LAB — COMPREHENSIVE METABOLIC PROFILE - BMC/JMC ONLY
ALBUMIN/GLOBULIN RATIO: 1 (ref 0.8–2.0)
ALBUMIN: 3.1 g/dL — ABNORMAL LOW (ref 3.5–5.0)
ALKALINE PHOSPHATASE: 63 U/L (ref 38–126)
ALT (SGPT): 8 U/L — ABNORMAL LOW (ref 14–54)
ANION GAP: 10 mmol/L (ref 3–11)
AST (SGOT): 16 U/L (ref 15–41)
BILIRUBIN TOTAL: 0.2 mg/dL — ABNORMAL LOW (ref 0.3–1.2)
BUN/CREA RATIO: 10 (ref 6–22)
BUN: 6 mg/dL (ref 6–20)
BUN: 6 mg/dL (ref 6–20)
CALCIUM: 8.5 mg/dL — ABNORMAL LOW (ref 8.8–10.2)
CHLORIDE: 100 mmol/L — ABNORMAL LOW (ref 101–111)
CO2 TOTAL: 23 mmol/L (ref 22–32)
CREATININE: 0.59 mg/dL (ref 0.44–1.00)
ESTIMATED GFR: >60 mL/min/{1.73_m2} (ref >60)
ESTIMATED GFR: >60 mL/min/{1.73_m2} (ref >60)
GLUCOSE: 78 mg/dL (ref 70–110)
POTASSIUM: 3.9 mmol/L (ref 3.4–5.1)
PROTEIN TOTAL: 6.1 g/dL — ABNORMAL LOW (ref 6.4–8.3)
SODIUM: 133 mmol/L — ABNORMAL LOW (ref 136–145)

## 2017-05-04 LAB — CBC WITH DIFF
BASOPHIL #: 0.01 10*3/uL (ref 0.00–0.10)
BASOPHIL %: 0 % (ref 0–3)
EOSINOPHIL #: 0.07 10*3/uL (ref 0.00–0.50)
EOSINOPHIL #: 0.07 x10?3/uL (ref 0.00–0.50)
EOSINOPHIL %: 2 % (ref 0–5)
EOSINOPHIL %: 2 % (ref 0–5)
HCT: 31 % — ABNORMAL LOW (ref 36.0–45.0)
HGB: 10.3 g/dL — ABNORMAL LOW (ref 12.0–15.5)
LYMPHOCYTE #: 0.91 10*3/uL — ABNORMAL LOW (ref 1.00–4.80)
LYMPHOCYTE %: 21 % (ref 15–43)
MCH: 32.7 pg (ref 27.5–33.2)
MCHC: 33.3 g/dL (ref 32.0–36.0)
MCV: 98.4 fL — ABNORMAL HIGH (ref 82.0–97.0)
MONOCYTE #: 0.63 10*3/uL (ref 0.20–0.90)
MONOCYTE %: 14 % — ABNORMAL HIGH (ref 5–12)
MPV: 7.6 fL (ref 7.4–10.5)
NEUTROPHIL #: 2.79 10*3/uL (ref 1.50–6.50)
NEUTROPHIL %: 63 % (ref 43–76)
PLATELETS: 286 10*3/uL (ref 150–450)
RBC: 3.16 10*6/uL — ABNORMAL LOW (ref 4.00–5.10)
RDW: 12.6 % (ref 11.0–16.0)
WBC: 4.4 10*3/uL (ref 4.0–11.0)
WBC: 4.4 x10?3/uL (ref 4.0–11.0)

## 2017-05-04 LAB — MAGNESIUM: MAGNESIUM: 2 mg/dL (ref 1.4–2.1)

## 2017-05-04 MED ORDER — SODIUM CHLORIDE 0.9 % (FLUSH) INJECTION SYRINGE
10.00 mL | INJECTION | Freq: Once | INTRAMUSCULAR | Status: AC
Start: 2017-05-04 — End: 2017-05-04
  Administered 2017-05-04: 10 mL

## 2017-05-04 MED ORDER — HEPARIN, PORCINE (PF) 100 UNIT/ML INTRAVENOUS SYRINGE
5.00 mL | INJECTION | Freq: Once | INTRAVENOUS | Status: AC
Start: 2017-05-04 — End: 2017-05-04
  Administered 2017-05-04: 5 mL
  Filled 2017-05-04: qty 5

## 2017-05-04 MED ADMIN — heparin, porcine (PF) 100 unit/mL intravenous syringe: @ 14:00:00

## 2017-05-04 NOTE — Discharge Instructions (Signed)

## 2017-05-16 ENCOUNTER — Other Ambulatory Visit: Payer: Self-pay

## 2017-05-21 ENCOUNTER — Ambulatory Visit: Payer: BC Managed Care – PPO | Attending: NURSE PRACTITIONER

## 2017-05-21 DIAGNOSIS — E785 Hyperlipidemia, unspecified: Secondary | ICD-10-CM | POA: Insufficient documentation

## 2017-05-21 LAB — COMPREHENSIVE METABOLIC PROFILE - BMC/JMC ONLY
ALBUMIN/GLOBULIN RATIO: 0.9 (ref 0.8–2.0)
ALBUMIN/GLOBULIN RATIO: 0.9 (ref 0.8–2.0)
ALBUMIN: 3 g/dL — ABNORMAL LOW (ref 3.5–5.0)
ALKALINE PHOSPHATASE: 64 U/L (ref 38–126)
ALT (SGPT): 8 U/L — ABNORMAL LOW (ref 14–54)
BUN: 6 mg/dL (ref 6–20)
BUN: 6 mg/dL (ref 6–20)
ESTIMATED GFR: >60 mL/min/1.73m?2 (ref >60)
ESTIMATED GFR: >60 mL/min/{1.73_m2} (ref >60)
POTASSIUM: 4.2 mmol/L (ref 3.4–5.1)
PROTEIN TOTAL: 6.5 g/dL (ref 6.4–8.3)
PROTEIN TOTAL: 6.5 g/dL (ref 6.4–8.3)

## 2017-05-21 LAB — LIPID PANEL: CHOL/HDL RATIO: 5.3

## 2017-06-01 ENCOUNTER — Inpatient Hospital Stay
Admission: RE | Admit: 2017-06-01 | Discharge: 2017-06-01 | Disposition: A | Discharge status: Still In Hospital | Payer: BC Managed Care – PPO | Source: Ambulatory Visit | Attending: Hematology & Oncology | Admitting: Hematology & Oncology

## 2017-06-01 DIAGNOSIS — Z95828 Presence of other vascular implants and grafts: Secondary | ICD-10-CM

## 2017-06-01 DIAGNOSIS — Z452 Encounter for adjustment and management of vascular access device: Secondary | ICD-10-CM | POA: Insufficient documentation

## 2017-06-01 DIAGNOSIS — C3490 Malignant neoplasm of unspecified part of unspecified bronchus or lung: Secondary | ICD-10-CM

## 2017-06-01 MED ORDER — SODIUM CHLORIDE 0.9 % (FLUSH) INJECTION SYRINGE
10.0000 mL | INJECTION | Freq: Once | INTRAMUSCULAR | Status: AC
Start: 2017-06-01 — End: 2017-06-01

## 2017-06-01 MED ORDER — HEPARIN, PORCINE (PF) 100 UNIT/ML INTRAVENOUS SYRINGE
5.0000 mL | INJECTION | Freq: Once | INTRAVENOUS | Status: AC
Start: 2017-06-01 — End: 2017-06-01
  Administered 2017-06-01: 5 mL
  Filled 2017-06-01: qty 5

## 2017-06-01 NOTE — Discharge Instructions (Signed)

## 2017-06-11 ENCOUNTER — Telehealth: Payer: Self-pay

## 2017-06-11 NOTE — Nursing Note (Signed)
Pt called stating that she has a dry cough; as a result of her coughing has sore ribs on the right side. Would like to know if Dr. Barbaraann Rondo can prescribe her something; and also if she could get an appt with Dr. Barbaraann Rondo for next week. Requesting a call back @ [phone]

## 2017-06-11 NOTE — Nurses Notes (Signed)
Spoke with Dr Barbaraann Rondo regarding pt's dry cough - Dr. Barbaraann Rondo would like pt to follow up with PCP and if cough persists or gets worse than Dr. Barbaraann Rondo will see pt in the clinic. Pt notified of Dr. Barbaraann Rondo recommendation and verbalized understanding. Pt stated that she will contact her PCP.

## 2017-06-29 ENCOUNTER — Inpatient Hospital Stay
Admission: RE | Admit: 2017-06-29 | Discharge: 2017-06-29 | Disposition: A | Discharge status: Still In Hospital | Payer: 59 | Source: Ambulatory Visit | Attending: Hematology & Oncology | Admitting: Hematology & Oncology

## 2017-06-29 NOTE — Discharge Instructions (Signed)

## 2017-07-08 NOTE — Discharge Instructions (Signed)

## 2017-07-09 ENCOUNTER — Inpatient Hospital Stay
Admission: RE | Admit: 2017-07-09 | Discharge: 2017-07-09 | Disposition: A | Discharge status: Still In Hospital | Payer: 59 | Source: Ambulatory Visit | Attending: Hematology & Oncology | Admitting: Hematology & Oncology

## 2017-07-09 ENCOUNTER — Encounter (HOSPITAL_BASED_OUTPATIENT_CLINIC_OR_DEPARTMENT_OTHER): Payer: Self-pay | Admitting: Hematology & Oncology

## 2017-07-09 ENCOUNTER — Inpatient Hospital Stay (HOSPITAL_BASED_OUTPATIENT_CLINIC_OR_DEPARTMENT_OTHER): Payer: 59 | Attending: Hematology & Oncology | Admitting: Hematology & Oncology

## 2017-07-09 VITALS — BP 124/71 | HR 120 | Temp 97.7°F | Resp 22 | Ht 60.75 in | Wt 103.2 lb

## 2017-07-09 DIAGNOSIS — Z95828 Presence of other vascular implants and grafts: Secondary | ICD-10-CM

## 2017-07-09 DIAGNOSIS — Z923 Personal history of irradiation: Secondary | ICD-10-CM | POA: Insufficient documentation

## 2017-07-09 DIAGNOSIS — Z79899 Other long term (current) drug therapy: Secondary | ICD-10-CM | POA: Insufficient documentation

## 2017-07-09 DIAGNOSIS — D649 Anemia, unspecified: Secondary | ICD-10-CM

## 2017-07-09 DIAGNOSIS — C3491 Malignant neoplasm of unspecified part of right bronchus or lung: Secondary | ICD-10-CM | POA: Insufficient documentation

## 2017-07-09 DIAGNOSIS — C3490 Malignant neoplasm of unspecified part of unspecified bronchus or lung: Secondary | ICD-10-CM | POA: Insufficient documentation

## 2017-07-09 DIAGNOSIS — Z9221 Personal history of antineoplastic chemotherapy: Secondary | ICD-10-CM

## 2017-07-09 DIAGNOSIS — Z79890 Hormone replacement therapy (postmenopausal): Secondary | ICD-10-CM | POA: Insufficient documentation

## 2017-07-09 DIAGNOSIS — E039 Hypothyroidism, unspecified: Secondary | ICD-10-CM | POA: Insufficient documentation

## 2017-07-09 LAB — COMPREHENSIVE METABOLIC PROFILE - BMC/JMC ONLY
ALBUMIN/GLOBULIN RATIO: 0.9 (ref 0.8–2.0)
ALBUMIN: 3 g/dL — ABNORMAL LOW (ref 3.5–5.0)
ALKALINE PHOSPHATASE: 70 U/L (ref 38–126)
ALT (SGPT): 9 U/L — ABNORMAL LOW (ref 14–54)
ALT (SGPT): 9 U/L — ABNORMAL LOW (ref 14–54)
ANION GAP: 10 mmol/L (ref 3–11)
AST (SGOT): 17 U/L (ref 15–41)
BILIRUBIN TOTAL: 0.3 mg/dL (ref 0.3–1.2)
BUN/CREA RATIO: 9 (ref 6–22)
BUN: 7 mg/dL (ref 6–20)
CALCIUM: 8.9 mg/dL (ref 8.8–10.2)
CHLORIDE: 104 mmol/L (ref 101–111)
CO2 TOTAL: 23 mmol/L (ref 22–32)
CREATININE: 0.78 mg/dL (ref 0.44–1.00)
ESTIMATED GFR: >60 mL/min/{1.73_m2} (ref >60)
GLUCOSE: 105 mg/dL (ref 70–110)
POTASSIUM: 3.8 mmol/L (ref 3.4–5.1)
PROTEIN TOTAL: 6.4 g/dL (ref 6.4–8.3)
SODIUM: 137 mmol/L (ref 136–145)

## 2017-07-09 LAB — CBC WITH DIFF
BASOPHIL #: 0.01 10*3/uL (ref 0.00–0.10)
BASOPHIL %: 0 % (ref 0–3)
EOSINOPHIL #: 0.25 10*3/uL (ref 0.00–0.50)
EOSINOPHIL %: 7 % — ABNORMAL HIGH (ref 0–5)
HCT: 31.7 % — ABNORMAL LOW (ref 36.0–45.0)
HGB: 10.5 g/dL — ABNORMAL LOW (ref 12.0–15.5)
LYMPHOCYTE #: 0.72 10*3/uL — ABNORMAL LOW (ref 1.00–4.80)
LYMPHOCYTE %: 21 % (ref 15–43)
MCH: 28.8 pg (ref 27.5–33.2)
MCHC: 33.1 g/dL (ref 32.0–36.0)
MCV: 87 fL (ref 82.0–97.0)
MONOCYTE #: 0.5 10*3/uL (ref 0.20–0.90)
MONOCYTE %: 15 % — ABNORMAL HIGH (ref 5–12)
MPV: 7.7 fL (ref 7.4–10.5)
NEUTROPHIL #: 1.97 10*3/uL (ref 1.50–6.50)
NEUTROPHIL %: 57 % (ref 43–76)
PLATELETS: 299 10*3/uL (ref 150–450)
RBC: 3.65 10*6/uL — ABNORMAL LOW (ref 4.00–5.10)
RDW: 14.7 % (ref 11.0–16.0)
WBC: 3.5 10*3/uL — ABNORMAL LOW (ref 4.0–11.0)

## 2017-07-09 LAB — MAGNESIUM: MAGNESIUM: 2.2 mg/dL (ref 1.6–2.6)

## 2017-07-09 MED ORDER — SODIUM CHLORIDE 0.9 % (FLUSH) INJECTION SYRINGE
10.0000 mL | INJECTION | Freq: Once | INTRAMUSCULAR | Status: AC
Start: 2017-07-09 — End: 2017-07-09
  Administered 2017-07-09: 10 mL

## 2017-07-09 MED ORDER — HEPARIN, PORCINE (PF) 100 UNIT/ML INTRAVENOUS SYRINGE
5.0000 mL | INJECTION | Freq: Once | INTRAVENOUS | Status: AC
Start: 2017-07-09 — End: 2017-07-09
  Administered 2017-07-09: 5 mL
  Filled 2017-07-09: qty 5

## 2017-07-09 NOTE — Progress Notes (Signed)
[address], [address]  [place] [address]  Operated by [facility]  Return Patient Progress Note    Date: 07/09/2017    Name: Gina Barrett  MRN: [id number]  Referring Physician: No ref. provider found  Primary Care Provider: Lanier Ensign    Reason for visit/consultation Other (f/u lung ca)    Assessment/Plan: [name] is a 62 y.o. 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  Surveillance discussed, should be followed initially every 3 months, seeing rad onc in 3 months, follow up in our clinic in 6 months.  CT ordered for surveillance, will need to re-assess cough and possible pulmonary fibrosis.        1. Non-small cell lung cancer, right (CMS HCC)  -s/p chemoradiation, carboplatin, paclitaxel  -will order chest CT prior to next visit, should not be done sooner than march as that would be 3 months post last imaging per NCCN guidelines  -surveillance:   history and physical in chest CT with and without contrast every 3-6 months for 3 years, then H&P and chest CT plus or minus contrast every 6 months for 2 years, then H&P and low-dose noncontrast enhanced chest CT annually    2. Normocytic anemia  -iron studies, b12 and folate normal  -consideration of anemia of chronic disease  -will continue to monitor    3. Imaging suggests pulmonary fibrosis - if repeat imaging confirms will refer to pulmonary  -currently complaints of cough    __________________________________________________________________________  Interval History:  Information Obtained from: patient, relative and history reviewed via medical record  Pt c/o intermittent dry cough, right rib pain exacerbated by cough - alleviated with aleve, and fatigue. She mentions the cough is alleviated after drinking and eating. She saw her PCP for the right rib pain,  who believed it was secondary to coughing. She was given benzonatate and an NSAID, but she stopped taking them due to experiencing tightness in her neck. She has had no swallowing difficulties. She will f/u with Dr. Elpidio Galea in March, 2019. She denies nausea, dizziness, or personality changes. She otherwise has no new complaints on a 12 point review of systems.      ROS:   A 12 point review systems was discussed, as per HPI, and otherwise negative.    Past Medical History  Past Medical History:   Diagnosis Date   . Abnormal Pap smear     ASCUS, ASCUS cannot rule out HGSIL   . Cancer (CMS HCC)     non small cell lung cancer (R)-s/p Chemo/Radiation completed 8/18   . Colon polyp    . Dysphagia    . Hypothyroid    . Non-small cell lung cancer, right (CMS [place])     s/p Chemo completed 9/18   . Raynaud's disease    . STD (sexually transmitted disease)     HSV   . Unspecified disorder of thyroid     Hypothyroidism       Current Outpatient Medications   Medication Sig   . Conj Estrog-Medroxyprogest Ace 0.3-1.5 mg Oral Tablet Take 1 Tab by mouth Once a day   . cycloSPORINE (RESTASIS) 0.05 % Ophthalmic Dropperette Instill 1 Drop into both eyes Every 12 hours   . docusate sodium (STOOL SOFTENER ORAL) Take by mouth Once per day as needed   .  levothyroxine (SYNTHROID) 88 mcg Oral Tablet take 88 mcg by mouth Once a day.   . loratadine (CLARITIN) 10 mg Oral Tablet take 10 mg by mouth Once a day.   . Nifedipine (ADALAT CC) 30 mg Oral Tablet Sustained Release take 30 mg by mouth Once a day.   . OMEGA-3 FATTY ACIDS (FISH OIL ORAL) take  by mouth Once a day.   . ondansetron (ZOFRAN ODT) 4 mg Oral Tablet, Rapid Dissolve Take 1 Tab (4 mg total) by mouth Every 8 hours as needed for nausea/vomiting (Patient not taking: Reported on 03/03/2017)   . polyethylene glycol (MIRALAX) 17 gram Oral Powder in Packet Take 1 Packet (17 g total) by mouth Every 12 hours as needed          Physical Examination:  BP 124/71   Pulse (!) 120   Temp 36.5 C  (97.7 F) (Oral)   Resp (!) 22   Ht 1.543 m (5' 0.75")   Wt 46.8 kg (103 lb 3.2 oz)   SpO2 100% Comment: RA  BMI 19.66 kg/m   ECOG Status: 1 - Restricted in physically strenuous activity, but ambulatory and able to carry out work of a light or sedentary nature, e.g., light housework, office work.  KPS Score: 90- Able to carry on normal activity; minor signs or symptoms of disease  General: appears in good health  Eyes: Sclera non-icteric.   HENT:Mouth mucous membranes moist.   Neck: no adenopathy  Lungs: clear to auscultation bilaterally.   Cardiovascular: RRR without murmur.  Abdomen: soft, non-tender, epigastric region is not tender to palpation  Extremities: no cyanosis or edema  Skin: Skin is warm and dry. No lesions or rashes.   Neurologic: grossly normal  Psychiatric: AOx3  Access:  port    Labs:  Results for orders placed or performed during the hospital encounter of 07/09/17 (from the past 36 hour(s))   CBC/DIFF    Narrative    The following orders were created for panel order CBC/DIFF.  Procedure                               Abnormality         Status                     ---------                               -----------         ------                     CBC WITH DIFF[[id number]]                Abnormal            Final result                 Please view results for these tests on the individual orders.   COMPREHENSIVE METABOLIC PROFILE - [facility] ONLY   Result Value Ref Range    SODIUM 137 136 - 145 mmol/L    POTASSIUM 3.8 3.4 - 5.1 mmol/L    CHLORIDE 104 101 - 111 mmol/L    CO2 TOTAL 23 22 - 32 mmol/L    ANION GAP 10 3 - 11 mmol/L    BUN 7 6 - 20 mg/dL    CREATININE 0.78  0.44 - 1.00 mg/dL    BUN/CREA RATIO 9 6 - 22    ESTIMATED GFR >60 >60 mL/min/1.82m^2    ALBUMIN 3.0 (L) 3.5 - 5.0 g/dL    CALCIUM 8.9 8.8 - 10.2 mg/dL    GLUCOSE 105 70 - 110 mg/dL    ALKALINE PHOSPHATASE 70 38 - 126 U/L    ALT (SGPT) 9 (L) 14 - 54 U/L    AST (SGOT) 17 15 - 41 U/L    BILIRUBIN TOTAL 0.3 0.3 - 1.2 mg/dL    PROTEIN  TOTAL 6.4 6.4 - 8.3 g/dL    ALBUMIN/GLOBULIN RATIO 0.9 0.8 - 2.0   MAGNESIUM   Result Value Ref Range    MAGNESIUM 2.2 1.6 - 2.6 mg/dL   CBC WITH DIFF   Result Value Ref Range    WBC 3.5 (L) 4.0 - 11.0 x10^3/uL    RBC 3.65 (L) 4.00 - 5.10 x10^6/uL    HGB 10.5 (L) 12.0 - 15.5 g/dL    HCT 31.7 (L) 36.0 - 45.0 %    MCV 87.0 82.0 - 97.0 fL    MCH 28.8 27.5 - 33.2 pg    MCHC 33.1 32.0 - 36.0 g/dL    RDW 14.7 11.0 - 16.0 %    PLATELETS 299 150 - 450 x10^3/uL    MPV 7.7 7.4 - 10.5 fL    NEUTROPHIL % 57 43 - 76 %    LYMPHOCYTE % 21 15 - 43 %    MONOCYTE % 15 (H) 5 - 12 %    EOSINOPHIL % 7 (H) 0 - 5 %    BASOPHIL % 0 0 - 3 %    NEUTROPHIL # 1.97 1.50 - 6.50 x10^3/uL    LYMPHOCYTE # 0.72 (L) 1.00 - 4.80 x10^3/uL    MONOCYTE # 0.50 0.20 - 0.90 x10^3/uL    EOSINOPHIL # 0.25 0.00 - 0.50 x10^3/uL    BASOPHIL # 0.01 0.00 - 0.10 x10^3/uL       Radiology:  EXAMINATION: FLUORO ESOPHAGRAM (BA SWALLOW)     EXAM DATE/TIME: 03/12/2017 9:45 AM    CLINICAL INDICATION: R10.13: Abdominal pain, epigastric  R13.10: Dysphagia    COMPARISON: None.    FINDINGS:     -SWALLOWING: Normal. No aspiration.  -PHARYNX: Normal appearance.  -ESOPHAGUS:   Strictures: Absent.  Masses: Absent.  Rings: Absent.  Hiatal hernia: None  Morphology: Normal appearance.  Motility: Normal appearance.    IMPRESSION:  Normal study.    EXAMINATION: PET CT TORSO SUBSEQUENT     EXAM DATE/TIME: 05/02/2017 10:17 AM    CLINICAL INFORMATION:  C34.11: Cancer of bronchus of right upper lobe (CMS  HCC)    COMPARISON: PET/CT from September 06, 2016.    RADIATION DOSE: 675 mGycm    Blood glucose prior to injection: 88 mg/dl    DOSE: 17.73 mCi of FDG    FINDINGS:    HEAD and NECK:  Hypermetabolic foci: None.  CT findings:  No masses or adenopathy.    THORAX:  Extensive parenchymal opacity in the right upper lobe is hypermetabolic  with a maximum SUV of 6.1. This is most consistent with radiation  pneumonitis. Amidst the parenchymal disease, a known pulmonary nodule at  the  right apex appears to be grossly unchanged at about 10 mm. Maximum SUV  is 2.5. Again identified is a 9 x 27 mm area of the pleural-based  parenchymal opacity in the left lower lobe which has a maximum SUV of 4,  unchanged in  size and metabolic activity when compared to the previous  study. On the CT, there is associated peripheral interstitial disease was  present dating back to a study from May 2016. This is consistent with  pulmonary fibrosis. It is suspected that this hypermetabolic activity on  the left is associated with some active benign lung disease.    Abdomen/pelvis: No areas of abnormal hypermetabolic activity. No gross CT  abnormality.    MUSCULOSKELETAL:  Hypermetabolic foci: None.  lytic or blastic foci: No lytic or blastic foci.    IMPRESSION:  1. 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  2. Amidst the pneumonitis, the known right apical nodule appears to be grossly unchanged.  3. Area of hypermetabolic activity in the left lower lobe is felt to be related to benign lung disease. There is evidence for pulmonary fibrosis.      Other outside studies:       Return to clinic in 6 months    Dr. Beryle Flock  CC:  PCP General:  [place] [id number]    Scribe Attestation:  Dorothyann Gibbs, Scribe, scribed for Dr. Barbaraann Rondo on 09:06 07/09/2017    Physician Attestation:  Documentation assistance provided for Carlena Hurl, MD by Rulon Abide, [place]. information recorded by the scribe was done at my direction and has been reviewed and validated by me Carlena Hurl, MD. 12:39    Portions of this note may be dictated using voice recognition software or a dictation service. Variances in spelling and vocabulary are possible and unintentional. Not all errors are caught/corrected. Please notify the [facility] if any discrepancies are noted or if the meaning of any statement is not clear.

## 2017-07-28 ENCOUNTER — Inpatient Hospital Stay: Payer: 59

## 2017-08-06 ENCOUNTER — Inpatient Hospital Stay: Payer: 59

## 2017-08-10 ENCOUNTER — Inpatient Hospital Stay
Admission: RE | Admit: 2017-08-10 | Discharge: 2017-08-10 | Disposition: A | Discharge status: Still In Hospital | Payer: 59 | Source: Ambulatory Visit | Attending: Hematology & Oncology | Admitting: Hematology & Oncology

## 2017-08-10 DIAGNOSIS — Z452 Encounter for adjustment and management of vascular access device: Secondary | ICD-10-CM | POA: Insufficient documentation

## 2017-08-10 DIAGNOSIS — Z95828 Presence of other vascular implants and grafts: Secondary | ICD-10-CM

## 2017-08-10 DIAGNOSIS — C3490 Malignant neoplasm of unspecified part of unspecified bronchus or lung: Secondary | ICD-10-CM

## 2017-08-10 MED ORDER — HEPARIN, PORCINE (PF) 100 UNIT/ML INTRAVENOUS SYRINGE
5.0000 mL | INJECTION | Freq: Once | INTRAVENOUS | Status: AC
Start: 2017-08-10 — End: 2017-08-10
  Administered 2017-08-10: 12:00:00 5 mL
  Filled 2017-08-10: qty 5

## 2017-08-10 MED ORDER — SODIUM CHLORIDE 0.9 % (FLUSH) INJECTION SYRINGE
10.0000 mL | INJECTION | Freq: Once | INTRAMUSCULAR | Status: AC
Start: 2017-08-10 — End: 2017-08-10
  Administered 2017-08-10: 10 mL

## 2017-08-10 NOTE — Discharge Instructions (Signed)

## 2017-08-28 ENCOUNTER — Encounter (HOSPITAL_BASED_OUTPATIENT_CLINIC_OR_DEPARTMENT_OTHER): Payer: Self-pay | Admitting: Hematology & Oncology

## 2017-08-28 ENCOUNTER — Other Ambulatory Visit: Payer: Self-pay

## 2017-08-28 DIAGNOSIS — C3490 Malignant neoplasm of unspecified part of unspecified bronchus or lung: Secondary | ICD-10-CM

## 2017-08-28 NOTE — Nursing Note (Signed)
CT CHEST WO/W IV CONTRAST APPROVED [facility] [id number] VALID 3-14-6-12-19.  SENT MSG TO OFFICE-NEED LAB ORDERED.  LVM CALL/SCHEDULE.

## 2017-09-01 ENCOUNTER — Encounter (HOSPITAL_BASED_OUTPATIENT_CLINIC_OR_DEPARTMENT_OTHER): Payer: Self-pay | Admitting: Hematology & Oncology

## 2017-09-01 ENCOUNTER — Encounter

## 2017-09-01 NOTE — Nursing Note (Signed)
CT CHEST WO/W IV CONTRAST PT GOING TO PROGRESSIVE.

## 2017-09-01 NOTE — Nursing Note (Signed)
CT CHEST WO/W IV CONTRAST lvm x 2 call/schedule.

## 2017-09-04 ENCOUNTER — Encounter (HOSPITAL_BASED_OUTPATIENT_CLINIC_OR_DEPARTMENT_OTHER): Payer: Self-pay | Admitting: Radiology

## 2017-09-04 ENCOUNTER — Inpatient Hospital Stay (HOSPITAL_BASED_OUTPATIENT_CLINIC_OR_DEPARTMENT_OTHER): Admit: 2017-09-04 | Discharge: 2017-09-04 | Disposition: A | Discharge status: Home / Self Care | Payer: Self-pay

## 2017-09-04 NOTE — Discharge Instructions (Signed)

## 2017-09-07 ENCOUNTER — Inpatient Hospital Stay
Admission: RE | Admit: 2017-09-07 | Discharge: 2017-09-07 | Disposition: A | Discharge status: Still In Hospital | Payer: 59 | Source: Ambulatory Visit | Attending: Hematology & Oncology | Admitting: Hematology & Oncology

## 2017-09-07 ENCOUNTER — Other Ambulatory Visit (HOSPITAL_BASED_OUTPATIENT_CLINIC_OR_DEPARTMENT_OTHER): Payer: Self-pay

## 2017-09-07 DIAGNOSIS — Z452 Encounter for adjustment and management of vascular access device: Secondary | ICD-10-CM | POA: Insufficient documentation

## 2017-09-07 DIAGNOSIS — C3490 Malignant neoplasm of unspecified part of unspecified bronchus or lung: Secondary | ICD-10-CM

## 2017-09-07 DIAGNOSIS — Z95828 Presence of other vascular implants and grafts: Secondary | ICD-10-CM

## 2017-09-07 MED ORDER — HEPARIN, PORCINE (PF) 100 UNIT/ML INTRAVENOUS SYRINGE
5.0000 mL | INJECTION | Freq: Once | INTRAVENOUS | Status: AC
Start: 2017-09-07 — End: 2017-09-07
  Administered 2017-09-07: 5 mL
  Filled 2017-09-07: qty 5

## 2017-09-07 MED ORDER — SODIUM CHLORIDE 0.9 % (FLUSH) INJECTION SYRINGE
10.0000 mL | INJECTION | Freq: Once | INTRAMUSCULAR | Status: AC
Start: 2017-09-07 — End: 2017-09-07
  Administered 2017-09-07: 10 mL

## 2017-10-05 ENCOUNTER — Inpatient Hospital Stay
Admission: RE | Admit: 2017-10-05 | Discharge: 2017-10-05 | Disposition: A | Discharge status: Still In Hospital | Payer: 59 | Source: Ambulatory Visit | Attending: Hematology & Oncology | Admitting: Hematology & Oncology

## 2017-10-05 DIAGNOSIS — Z452 Encounter for adjustment and management of vascular access device: Secondary | ICD-10-CM | POA: Insufficient documentation

## 2017-10-05 DIAGNOSIS — Z95828 Presence of other vascular implants and grafts: Secondary | ICD-10-CM

## 2017-10-05 DIAGNOSIS — C3490 Malignant neoplasm of unspecified part of unspecified bronchus or lung: Secondary | ICD-10-CM

## 2017-10-05 MED ORDER — HEPARIN, PORCINE (PF) 100 UNIT/ML INTRAVENOUS SYRINGE
5.0000 mL | INJECTION | Freq: Once | INTRAVENOUS | Status: AC
Start: 2017-10-05 — End: 2017-10-05
  Administered 2017-10-05: 5 mL
  Filled 2017-10-05: qty 5

## 2017-10-05 MED ORDER — SODIUM CHLORIDE 0.9 % (FLUSH) INJECTION SYRINGE
10.0000 mL | INJECTION | Freq: Once | INTRAMUSCULAR | Status: AC
Start: 2017-10-05 — End: 2017-10-05
  Administered 2017-10-05: 10 mL

## 2017-10-05 NOTE — Discharge Instructions (Signed)

## 2017-10-09 ENCOUNTER — Telehealth (HOSPITAL_BASED_OUTPATIENT_CLINIC_OR_DEPARTMENT_OTHER): Payer: Self-pay | Admitting: Hematology & Oncology

## 2017-10-09 NOTE — Telephone Encounter (Signed)
Dr. Barbaraann Rondo wrote of script for miralax for patient on 03/09/2017 which patient did not pick up. I shredded it on 10/09/17.

## 2017-11-03 ENCOUNTER — Inpatient Hospital Stay: Admission: RE | Admit: 2017-11-03 | Payer: 59 | Source: Ambulatory Visit

## 2017-11-04 ENCOUNTER — Inpatient Hospital Stay
Admission: RE | Admit: 2017-11-04 | Discharge: 2017-11-04 | Disposition: A | Discharge status: Still In Hospital | Payer: 59 | Source: Ambulatory Visit | Attending: Hematology & Oncology | Admitting: Hematology & Oncology

## 2017-11-04 DIAGNOSIS — Z452 Encounter for adjustment and management of vascular access device: Secondary | ICD-10-CM | POA: Insufficient documentation

## 2017-11-04 DIAGNOSIS — C3490 Malignant neoplasm of unspecified part of unspecified bronchus or lung: Secondary | ICD-10-CM

## 2017-11-04 DIAGNOSIS — Z95828 Presence of other vascular implants and grafts: Secondary | ICD-10-CM

## 2017-11-04 MED ORDER — HEPARIN, PORCINE (PF) 100 UNIT/ML INTRAVENOUS SYRINGE
5.0000 mL | INJECTION | Freq: Once | INTRAVENOUS | Status: AC
Start: 2017-11-04 — End: 2017-11-04
  Administered 2017-11-04: 5 mL
  Filled 2017-11-04: qty 5

## 2017-11-04 MED ORDER — SODIUM CHLORIDE 0.9 % (FLUSH) INJECTION SYRINGE
10.0000 mL | INJECTION | Freq: Once | INTRAMUSCULAR | Status: AC
Start: 2017-11-04 — End: 2017-11-04
  Administered 2017-11-04: 10 mL

## 2017-11-04 NOTE — Discharge Instructions (Signed)

## 2017-11-04 NOTE — Nurses Notes (Signed)
Patient discharged home.  AVS reviewed with patient.  A written copy of the AVS and discharge instructions was given to the patient.  Questions sufficiently answered as needed.  Patient encouraged to follow up with PCP as indicated.  In the event of an emergency, patient instructed to call 911 or go to the nearest emergency room.

## 2017-11-15 NOTE — Progress Notes (Signed)
[address], [address]  [place] [address]  Operated by [facility]  Return Patient Progress Note    Date: 04/06/2017    Name: Gina Barrett  MRN: [id number]  Referring Physician: No ref. provider found  Primary Care Provider: [facility]ier Ensign    Reason for visit/consultation Other (f/u lung)      Interval History:  Information Obtained from: patient, relative and history reviewed via medical record  Patient has been following with GI since her last visit, diagnosed with stricture.  Epigastric discomfort improving.  On open questioning patient mentioned she bit her tongue recently, with blister at the tip of her tongue, otherwise no new complaints.    ROS:   A 12 point review systems was discussed, as per HPI, and otherwise negative.    Past Medical History  Past Medical History:   Diagnosis Date   . Abnormal Pap smear     ASCUS, ASCUS cannot rule out HGSIL   . Cancer (CMS HCC)     non small cell lung cancer (R)-s/p Chemo/Radiation completed 8/18   . Colon polyp    . Dysphagia    . Hypothyroid    . Non-small cell lung cancer, right (CMS [place])     s/p Chemo completed 9/18   . Raynaud's disease    . STD (sexually transmitted disease)     HSV   . Unspecified disorder of thyroid     Hypothyroidism       Current Outpatient Medications   Medication Sig   . Conj Estrog-Medroxyprogest Ace 0.3-1.5 mg Oral Tablet Take 1 Tab by mouth Once a day   . cycloSPORINE (RESTASIS) 0.05 % Ophthalmic Dropperette Instill 1 Drop into both eyes Every 12 hours   . docusate sodium (STOOL SOFTENER ORAL) Take by mouth Once per day as needed   . levothyroxine (SYNTHROID) 88 mcg Oral Tablet take 88 mcg by mouth Once a day.   . loratadine (CLARITIN) 10 mg Oral Tablet take 10 mg by mouth Once a day.   . Nifedipine (ADALAT CC) 30 mg Oral Tablet Sustained Release take 30 mg by mouth Once a day.   . OMEGA-3 FATTY ACIDS (FISH OIL ORAL) take  by  mouth Once a day.   . ondansetron (ZOFRAN ODT) 4 mg Oral Tablet, Rapid Dissolve Take 1 Tab (4 mg total) by mouth Every 8 hours as needed for nausea/vomiting (Patient not taking: Reported on 03/03/2017)   . polyethylene glycol (MIRALAX) 17 gram Oral Powder in Packet Take 1 Packet (17 g total) by mouth Every 12 hours as needed          Physical Examination:  BP 102/69   Pulse (!) 115   Temp 36.4 C (97.6 F) (Oral)   Resp (!) 24   Ht 1.543 m (5' 0.75")   Wt 49.2 kg (108 lb 6.4 oz)   SpO2 100% Comment: r/a  BMI 20.65 kg/m      ECOG Status: 1 - Restricted in physically strenuous activity, but ambulatory and able to carry out work of a light or sedentary nature, e.g., light housework, office work.  KPS Score: 90- Able to carry on normal activity; minor signs or symptoms of disease  General: appears in good health  Eyes: Sclera non-icteric.   HENT:Mouth mucous membranes moist.   Neck: no adenopathy  Lungs: clear to auscultation bilaterally.   Cardiovascular: RRR without murmur.  Abdomen: soft,  non-tender  Extremities: no cyanosis or edema  Skin: no lesions, no rashes, ears improved  Neurologic: grossly normal  Lymphatics: No nodes appreciated  Psychiatric: AOx3  Access:  port    Labs:  No results found for this or any previous visit (from the past 36 hour(s)).  Results for JERMANI, PUND (MRN [id number])    Ref. Range 03/12/2017 11:24   WBC Latest Ref Range: 4.0 - 11.0 x10^3/uL 2.5 (L)   HGB Latest Ref Range: 12.0 - 15.5 g/dL 10.4 (L)   HCT Latest Ref Range: 36.0 - 45.0 % 30.2 (L)   PLATELET COUNT Latest Ref Range: 150 - 450 x10^3/uL 214   RBC Latest Ref Range: 4.00 - 5.10 x10^6/uL 3.28 (L)   MCV Latest Ref Range: 82.0 - 97.0 fL 92.0   MCHC Latest Ref Range: 32.0 - 36.0 g/dL 34.5   MCH Latest Ref Range: 27.5 - 33.2 pg 31.7   RDW Latest Ref Range: 11.0 - 16.0 % 22.4 (H)   MPV Latest Ref Range: 7.4 - 10.5 fL 8.3   PMN'S Latest Ref Range: 43 - 76 % 61   LYMPHOCYTES Latest Ref Range: 15 - 43 % 21   EOSINOPHIL Latest  Ref Range: 0 - 5 % 1   MONOCYTES Latest Ref Range: 5 - 12 % 17 (H)   BASOPHILS Latest Ref Range: 0 - 3 % 1   PMN ABS Latest Ref Range: 1.50 - 6.50 x10^3/uL 1.50   LYMPHS ABS Latest Ref Range: 1.00 - 4.80 x10^3/uL 0.50 (L)   EOS ABS Latest Ref Range: 0.00 - 0.50 x10^3/uL 0.00   MONOS ABS Latest Ref Range: 0.20 - 0.90 x10^3/uL 0.40   BASOS ABS Latest Ref Range: 0.00 - 0.10 x10^3/uL 0.00   SODIUM Latest Ref Range: 136 - 145 mmol/L 137   POTASSIUM Latest Ref Range: 3.4 - 5.1 mmol/L 3.3 (L)   CHLORIDE Latest Ref Range: 101 - 111 mmol/L 104   CARBON DIOXIDE Latest Ref Range: 22 - 32 mmol/L 24   BUN Latest Ref Range: 6 - 20 mg/dL 5 (L)   CREATININE Latest Ref Range: 0.44 - 1.00 mg/dL 0.68   GLUCOSE Latest Ref Range: 70 - 110 mg/dL 94   ANION GAP Latest Ref Range: 3 - 11 mmol/L 9   BUN/CREAT RATIO Latest Ref Range: 6 - 22  7   ESTIMATED GLOMERULAR FILTRATION RATE Latest Ref Range: >60 mL/min/1.28m^2 >60   CALCIUM Latest Ref Range: 8.8 - 10.2 mg/dL 8.8   FERRITIN Latest Ref Range: 11 - 307 ng/mL 699 (H)   IRON Latest Ref Range: 28 - 170 ug/dL 110   IRON BINDING CAPACITY Latest Units: ug/dL 224   IRON SATURATION Latest Ref Range: 15 - 50 % 49   TRANSFERRIN Latest Ref Range: 192 - 282 mg/dL 157 (L)   TOTAL PROTEIN Latest Ref Range: 6.4 - 8.3 g/dL 5.7 (L)   ALBUMIN Latest Ref Range: 3.5 - 5.0 g/dL 3.0 (L)   BILIRUBIN, TOTAL Latest Ref Range: 0.3 - 1.2 mg/dL 0.3   AST (SGOT) Latest Ref Range: 15 - 41 U/L 30   ALT (SGPT) Latest Ref Range: 14 - 54 U/L 20   ALKALINE PHOSPHATASE Latest Ref Range: 38 - 126 U/L 57   A/G RATIO Latest Ref Range: 0.8 - 2.0  1.1   FOLATE Latest Ref Range: >=4.5 ng/mL 7.8   VITAMIN B12 Latest Ref Range: 180 - 914 pg/mL 468     Radiology:    Other outside studies:  Assessment/Plan: EBUNOLUWA GERNERT is a 62 y.o. 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  She continues to have epigastric discomfort, upper GI scope  done 03/30/2017 showing gastritis.  Barium swallow on 03/12/2017 normal.  Reflux has improved, nausea resolved, but was diagnosed with stricture per patient.    1. Malignant neoplasm of lung, unspecified laterality, unspecified part of lung (CMS HCC)  -s/p chemoradiation, carboplatin, paclitaxel  -surveillance: history and physical in chest CT with and without contrast every 3-6 months for 3 years, then H&P and chest CT plus or minus contrast every 6 months for 2 years, then H&P and low-dose noncontrast enhanced chest CT annually    2. Epigastric discomfort  -evaluation by GI, input appreciated  -thought to be secondary to radiation therapy  -barium swallow and upper GI scope showing stricture and gastritis        Return to clinic in 3 months    Dr. Beryle Flock  CC:  PCP General:  [place]  [id number]    Portions of this note may be dictated using voice recognition software or a dictation service. Variances in spelling and vocabulary are possible and unintentional. Not all errors are caught/corrected. Please notify the [facility] if any discrepancies are noted or if the meaning of any statement is not clear.

## 2017-11-16 NOTE — Progress Notes (Signed)
[address], [address]  [place] [address]  Operated by [facility]  Return Patient Progress Note    Date: 02/17/2017    Name: Gina Barrett  MRN: [id number]  Referring Physician: No ref. provider found  Primary Care Provider: Lanier Ensign    Reason for visit/consultation Other (f/u; Non-small cell lung cancer)      Assessment/Plan: Gina Barrett is a 62 y.o. 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  She is recovering well, but has epigastric discomfort, will have her follow with GI.  Pulse today is elevated, but patient denies feeling light-headed, dizzy, or have significant hypotension.  Discussion held regarding surveillance.    1. Malignant neoplasm of lung, unspecified laterality, unspecified part of lung (CMS HCC)  -s/p chemoradiation, carboplatin, paclitaxel  -surveillance: history and physical in chest CT with and without contrast every 3-6 months for 3 years, then H&P and chest CT plus or minus contrast every 6 months for 2 years, then H&P and low-dose noncontrast enhanced chest CT annually    2. Gastroesophageal reflux disease, esophagitis presence not specified  -evaluation by GI  -ddx secondary to radiation therapy  -may need further imaging with barium swallow and scope if indicated      Return to clinic in 4 weeks    ________________________________________________________________________________________________________________________________________________     Interval History:  Information Obtained from: patient, daughter and history reviewed via medical record  Patient presents for routine follow-up after completion of chemoradiation with carboplatin, paclitaxel.  Her biggest complaint today is epigastric discomfort, that she states is in the center of her chest needle location of  her radiation.  She states that her appetite has been improving.  To note her vital signs were reviewed with her today, with an increased pulse, the patient states that she is feeling at her baseline state of health without any significant complaints.    Detailed Hematology-Oncology History:  Cancer Diagnosis: Non-small cell lung cancer diagnosed October 02, 2016 with intial evaluation and diagnosis made at [facility] in [place], [place].  Stage: Stage: IIIb  Molecular studies: We currently do not have ALK1, ROS1, EGFR or BRAF studies  Treatment: paclitaxel, carboplatin + RT    ROS:   A 12 point review systems was discussed, as per HPI, and otherwise negative.    Past Medical History  Past Medical History:   Diagnosis Date   . Abnormal Pap smear     ASCUS, ASCUS cannot rule out HGSIL   . Cancer (CMS HCC)     non small cell lung cancer (R)-s/p Chemo/Radiation completed 8/18   . Colon polyp    . Dysphagia    . Hypothyroid    . Non-small cell lung cancer, right (CMS [place])     s/p Chemo completed 9/18   . Raynaud's disease    . STD (sexually transmitted disease)     HSV   . Unspecified disorder of thyroid     Hypothyroidism         Current Outpatient Medications   Medication Sig   . Conj Estrog-Medroxyprogest Ace 0.3-1.5 mg Oral Tablet Take 1 Tab by mouth Once a day   . cycloSPORINE (RESTASIS) 0.05 % Ophthalmic Dropperette Instill 1 Drop into both eyes Every 12 hours   . docusate sodium (STOOL SOFTENER ORAL) Take by mouth Once  per day as needed   . levothyroxine (SYNTHROID) 88 mcg Oral Tablet take 88 mcg by mouth Once a day.   . loratadine (CLARITIN) 10 mg Oral Tablet take 10 mg by mouth Once a day.   . Nifedipine (ADALAT CC) 30 mg Oral Tablet Sustained Release take 30 mg by mouth Once a day.   . OMEGA-3 FATTY ACIDS (FISH OIL ORAL) take  by mouth Once a day.   . ondansetron (ZOFRAN ODT) 4 mg Oral Tablet, Rapid Dissolve Take 1 Tab (4 mg total) by mouth Every 8 hours as needed for nausea/vomiting (Patient not  taking: Reported on 03/03/2017)   . polyethylene glycol (MIRALAX) 17 gram Oral Powder in Packet Take 1 Packet (17 g total) by mouth Every 12 hours as needed          Physical Examination:  BP 115/70   Pulse (!) 120 Comment: apical  Temp 36.8 C (98.2 F) (Oral)   Resp 14   Ht 1.549 m (5' 0.98")   Wt 48.4 kg (106 lb 9.6 oz)   SpO2 100% Comment: r/a  BMI 20.15 kg/m       ECOG Status: 1 - Restricted in physically strenuous activity, but ambulatory and able to carry out work of a light or sedentary nature, e.g., light housework, office work.  KPS Score: 80- Normal activity with effort; some sign or symptoms of disease  General: appears stated age and no distress  Eyes: Conjunctiva clear., Pupils equal and round.   HENT:ENT without erythema or injection, mucous membranes moist.  Neck: supple, symmetrical, trachea midline  Lungs: clear to auscultation bilaterally.   Cardiovascular: Regular rate and rhythm  Abdomen: soft, non-tender, non-distended and no hepatosplenomegaly  Extremities: extremities normal, atraumatic, no cyanosis or edema  Skin: Skin warm and dry, No rashes and No lesions  Neurologic: grossly normal and alert and oriented x3  Lymphatics: No nodes appreciated  Psychiatric: AOx3 and affect normal    Labs:  No results found for this or any previous visit (from the past 36 hour(s)).  Results for KAYDIN, LABO (MRN [id number])    Ref. Range 02/17/2017 15:10   WBC Latest Ref Range: 4.0 - 11.0 x10^3/uL 2.8 (L)   HGB Latest Ref Range: 12.0 - 15.5 g/dL 11.7 (L)   HCT Latest Ref Range: 36.0 - 45.0 % 34.6 (L)   PLATELET COUNT Latest Ref Range: 150 - 450 x10^3/uL 150   RBC Latest Ref Range: 4.00 - 5.10 x10^6/uL 3.90 (L)   MCV Latest Ref Range: 82.0 - 97.0 fL 88.7   MCHC Latest Ref Range: 32.0 - 36.0 g/dL 33.7   MCH Latest Ref Range: 27.5 - 33.2 pg 29.9   RDW Latest Ref Range: 11.0 - 16.0 % 16.1 (H)   MPV Latest Ref Range: 7.4 - 10.5 fL 7.9   PMN'S Latest Ref Range: 43 - 76 % 53   LYMPHOCYTES Latest Ref Range:  15 - 43 % 21   EOSINOPHIL Latest Ref Range: 0 - 5 % 1   MONOCYTES Latest Ref Range: 5 - 12 % 24 (H)   BASOPHILS Latest Ref Range: 0 - 3 % 1   PMN ABS Latest Ref Range: 1.50 - 6.50 x10^3/uL 1.47 (L)   LYMPHS ABS Latest Ref Range: 1.00 - 4.80 x10^3/uL 0.58 (L)   EOS ABS Latest Ref Range: 0.00 - 0.50 x10^3/uL 0.03   MONOS ABS Latest Ref Range: 0.20 - 0.90 x10^3/uL 0.66   BASOS ABS Latest Ref Range: 0.00 - 0.10 x10^3/uL  0.02       Radiology:    Other outside studies:     Dr. Beryle Flock  CC:  PCP General:  Lanier Ensign  [facility] [place] [address]

## 2017-11-19 ENCOUNTER — Ambulatory Visit (HOSPITAL_BASED_OUTPATIENT_CLINIC_OR_DEPARTMENT_OTHER): Payer: 59 | Attending: NURSE PRACTITIONER

## 2017-11-19 DIAGNOSIS — E785 Hyperlipidemia, unspecified: Secondary | ICD-10-CM | POA: Insufficient documentation

## 2017-11-19 DIAGNOSIS — I519 Heart disease, unspecified: Secondary | ICD-10-CM

## 2017-11-19 DIAGNOSIS — E039 Hypothyroidism, unspecified: Secondary | ICD-10-CM | POA: Insufficient documentation

## 2017-11-19 LAB — COMPREHENSIVE METABOLIC PROFILE - BMC/JMC ONLY
ALBUMIN/GLOBULIN RATIO: 0.9 (ref 0.8–2.0)
ALBUMIN: 3.4 g/dL — ABNORMAL LOW (ref 3.5–5.0)
ALKALINE PHOSPHATASE: 66 U/L (ref 38–126)
ALKALINE PHOSPHATASE: 66 U/L (ref 38–126)
ALT (SGPT): 14 U/L (ref 14–54)
ANION GAP: 8 mmol/L (ref 3–11)
AST (SGOT): 21 U/L (ref 15–41)
BILIRUBIN TOTAL: 0.3 mg/dL (ref 0.3–1.2)
BUN/CREA RATIO: 16 (ref 6–22)
BUN: 11 mg/dL (ref 6–20)
CALCIUM: 9.3 mg/dL (ref 8.8–10.2)
CHLORIDE: 106 mmol/L (ref 101–111)
CO2 TOTAL: 26 mmol/L (ref 22–32)
CREATININE: 0.7 mg/dL (ref 0.44–1.00)
ESTIMATED GFR: >60 mL/min/{1.73_m2} (ref >60)
GLUCOSE: 87 mg/dL (ref 70–110)
GLUCOSE: 87 mg/dL (ref 70–110)
POTASSIUM: 4 mmol/L (ref 3.4–5.1)
PROTEIN TOTAL: 7.2 g/dL (ref 6.4–8.3)
SODIUM: 140 mmol/L (ref 136–145)

## 2017-11-19 LAB — LIPID PANEL
CHOL/HDL RATIO: 3.7
CHOLESTEROL: 187 mg/dL (ref 120–199)
HDL CHOL: 51 mg/dL (ref >39)
LDL CALC: 125 mg/dL (ref <=130)
TRIGLYCERIDES: 55 mg/dL (ref <150)
VLDL CALC: 11 mg/dL (ref 5–35)

## 2017-11-19 LAB — THYROID STIMULATING HORMONE (SENSITIVE TSH): TSH: 7.522 u[IU]/mL — ABNORMAL HIGH (ref 0.340–5.330)

## 2017-12-03 ENCOUNTER — Inpatient Hospital Stay
Admission: RE | Admit: 2017-12-03 | Discharge: 2017-12-03 | Disposition: A | Discharge status: Still In Hospital | Payer: 59 | Source: Ambulatory Visit | Attending: Hematology & Oncology | Admitting: Hematology & Oncology

## 2017-12-03 DIAGNOSIS — Z95828 Presence of other vascular implants and grafts: Secondary | ICD-10-CM

## 2017-12-03 DIAGNOSIS — Z452 Encounter for adjustment and management of vascular access device: Secondary | ICD-10-CM | POA: Insufficient documentation

## 2017-12-03 MED ORDER — SODIUM CHLORIDE 0.9 % (FLUSH) INJECTION SYRINGE
10.0000 mL | INJECTION | Freq: Once | INTRAMUSCULAR | Status: AC
Start: 2017-12-03 — End: 2017-12-03
  Administered 2017-12-03: 10 mL

## 2017-12-03 MED ORDER — HEPARIN, PORCINE (PF) 100 UNIT/ML INTRAVENOUS SYRINGE
5.0000 mL | INJECTION | Freq: Once | INTRAVENOUS | Status: AC
Start: 2017-12-03 — End: 2017-12-03
  Administered 2017-12-03: 5 mL
  Filled 2017-12-03: qty 5

## 2017-12-03 NOTE — Discharge Instructions (Signed)

## 2017-12-03 NOTE — Nurses Notes (Signed)
Patient discharged home.  AVS reviewed with patient.  A written copy of the AVS and discharge instructions was given to the patient.  Questions sufficiently answered as needed.  Patient encouraged to follow up with PCP as indicated.  In the event of an emergency, patient instructed to call 911 or go to the nearest emergency room.

## 2018-01-01 NOTE — Discharge Instructions (Signed)

## 2018-01-04 ENCOUNTER — Inpatient Hospital Stay
Admission: RE | Admit: 2018-01-04 | Discharge: 2018-01-04 | Disposition: A | Discharge status: Still In Hospital | Payer: 59 | Source: Ambulatory Visit

## 2018-01-04 DIAGNOSIS — Z95828 Presence of other vascular implants and grafts: Secondary | ICD-10-CM

## 2018-01-04 DIAGNOSIS — Z452 Encounter for adjustment and management of vascular access device: Secondary | ICD-10-CM | POA: Insufficient documentation

## 2018-01-04 MED ORDER — SODIUM CHLORIDE 0.9 % (FLUSH) INJECTION SYRINGE
10.0000 mL | INJECTION | Freq: Once | INTRAMUSCULAR | Status: AC
Start: 2018-01-04 — End: 2018-01-04
  Administered 2018-01-04: 10 mL

## 2018-01-04 MED ORDER — HEPARIN, PORCINE (PF) 100 UNIT/ML INTRAVENOUS SYRINGE
5.0000 mL | INJECTION | Freq: Once | INTRAVENOUS | Status: AC
Start: 2018-01-04 — End: 2018-01-04
  Administered 2018-01-04: 5 mL
  Filled 2018-01-04: qty 5

## 2018-01-04 MED ADMIN — lactated Ringers intravenous solution: @ 13:00:00 | NDC 00264775000

## 2018-01-07 ENCOUNTER — Encounter (HOSPITAL_BASED_OUTPATIENT_CLINIC_OR_DEPARTMENT_OTHER): Payer: 59 | Admitting: Internal Medicine

## 2018-01-08 ENCOUNTER — Inpatient Hospital Stay (HOSPITAL_BASED_OUTPATIENT_CLINIC_OR_DEPARTMENT_OTHER): Payer: 59 | Attending: Hematology & Oncology | Admitting: Hematology & Oncology

## 2018-01-08 ENCOUNTER — Encounter (HOSPITAL_BASED_OUTPATIENT_CLINIC_OR_DEPARTMENT_OTHER): Payer: Self-pay | Admitting: Hematology & Oncology

## 2018-01-08 VITALS — BP 122/78 | HR 117 | Temp 97.7°F | Resp 28 | Ht 60.98 in | Wt 113.2 lb

## 2018-01-08 DIAGNOSIS — R05 Cough: Secondary | ICD-10-CM | POA: Insufficient documentation

## 2018-01-08 DIAGNOSIS — Z9221 Personal history of antineoplastic chemotherapy: Secondary | ICD-10-CM | POA: Insufficient documentation

## 2018-01-08 DIAGNOSIS — Z79899 Other long term (current) drug therapy: Secondary | ICD-10-CM | POA: Insufficient documentation

## 2018-01-08 DIAGNOSIS — Z79890 Hormone replacement therapy (postmenopausal): Secondary | ICD-10-CM | POA: Insufficient documentation

## 2018-01-08 DIAGNOSIS — Z923 Personal history of irradiation: Secondary | ICD-10-CM | POA: Insufficient documentation

## 2018-01-08 DIAGNOSIS — E039 Hypothyroidism, unspecified: Secondary | ICD-10-CM | POA: Insufficient documentation

## 2018-01-08 DIAGNOSIS — C3490 Malignant neoplasm of unspecified part of unspecified bronchus or lung: Secondary | ICD-10-CM | POA: Insufficient documentation

## 2018-01-08 DIAGNOSIS — D649 Anemia, unspecified: Secondary | ICD-10-CM | POA: Insufficient documentation

## 2018-01-08 NOTE — Progress Notes (Signed)
[address], [address]  [place] [address]  Operated by [facility]  Return Patient Progress Note    Date: 01/08/2018    Name: Gina Barrett  MRN: [id number]  Referring Physician: No ref. provider found  Primary Care Provider: Lanier Ensign    Reason for visit/consultation Other (f/u lung ca)    Assessment/Plan:     ICD-10-CM    1. Malignant neoplasm of lung, unspecified laterality, unspecified part of lung (CMS HCC) C34.90 PET CT TORSO SUBSEQUENT     Gina Barrett is a 62 y.o. female with stage IIIb small cell lung cancer s/p concurrent chemoradiation completed 11 months ago. She has no clinical evidence of recurrence. Her Mediport is still in place. We discussed repeating her PET/CT scan at the 12 month mark from treatment, which would be August. She will return to clinic for face to face review after PET/CT scan. If she remains radiographically in remission, we will discuss removal of her Metaport.     She has mild anemia since the onset of treatment and her CBC from 07/09/17 showed hemo of 10.5 and hematocrit 31.7 with normal MCV. Will repeat CBC when she returns to clinic in August. We discussed now that she completed therapy, resuming screening imaging to include repeat mammography as soon as it can be arranged. Her colonoscopy is up to date.     Finally regarding intermittent dry cough, she is going to try aloe vera for about 1 week as she desires to do. If ineffective, recommend trial of Zantac over the counter but more importantly, await PET/CT result to esnure no recurrence of disease.     - PET CT TORSO SUBSEQUENT; Future    2. Anemia, unspecified type      Return in about 1 month (around 02/08/2018).    __________________________________________________________________________  Interval History:  Information Obtained from: patient, relative and history reviewed via medical record     Gina Barrett is a 62 y.o. female with stage IIIb non small cell lung cancer diagnosed and treated as below who returns to clinic for follow up. Her last imaging study was a PET CT 05/02/17 which showed stable right epical nodule with an area of hypermetabolic activity in left lower lobe that was felt to be benign. She returns to clinic today doing well overall. She has an intermittent dry cough mostly in the morning without associated rhinorrhea or cardiopulmonary symptoms except for the cough. She has recently seen Dr. Carlyle Dolly and is s/p EGD that was farily unremarkable. CT scan from 09/04/17 was reviewed and showed stable radiation changes in the right upper epical region. Laboratory studies from 11/19/17 was reviewed and showed mildly elevated TSH and normal LFT. States her energy levels are low and she must rest multiple times during the day.         ROS:   A 12 point review systems was discussed, as per HPI, and otherwise negative.    Past Medical History  Past Medical History:   Diagnosis Date   . Abnormal Pap smear     ASCUS, ASCUS cannot rule out HGSIL   . Cancer (CMS HCC)     non small cell lung cancer (R)-s/p Chemo/Radiation completed 8/18   . Colon polyp    . Dysphagia    . Hypothyroid    . Non-small cell lung cancer, right (CMS HCC)     s/p Chemo  completed 9/18   . Raynaud's disease    . STD (sexually transmitted disease)     HSV   . Unspecified disorder of thyroid     Hypothyroidism       Current Outpatient Medications   Medication Sig   . Conj Estrog-Medroxyprogest Ace 0.3-1.5 mg Oral Tablet Take 1 Tab by mouth Once a day   . cycloSPORINE (RESTASIS) 0.05 % Ophthalmic Dropperette Instill 1 Drop into both eyes Every 12 hours   . docusate sodium (STOOL SOFTENER ORAL) Take by mouth Once per day as needed   . levothyroxine (SYNTHROID) 88 mcg Oral Tablet take 88 mcg by mouth Once a day.   . loratadine (CLARITIN) 10 mg Oral Tablet take 10 mg by mouth Once a day.   . Nifedipine (ADALAT CC) 30 mg Oral Tablet Sustained  Release take 30 mg by mouth Once a day.   . OMEGA-3 FATTY ACIDS (FISH OIL ORAL) take  by mouth Once a day.          Physical Examination:  BP 122/78   Pulse (!) 117   Temp 36.5 C (97.7 F) (Oral)   Resp (!) 28   Ht 1.549 m (5' 0.98")   Wt 51.3 kg (113 lb 3.2 oz)   SpO2 98% Comment: r/a  BMI 21.40 kg/m      ECOG Status: 1 - Restricted in physically strenuous activity, but ambulatory and able to carry out work of a light or sedentary nature, e.g., light housework, office work.     General: appears in good health, NAD  Neck: no adenopathy  Lungs: clear to auscultation bilaterally.   Cardiovascular: RRR without murmur.  Abdomen: soft, non-tender, epigastric region is not tender to palpation  Extremities: no cyanosis or edema        Labs:  No results found for this or any previous visit (from the past 36 hour(s)).    Radiology:  EXAMINATION: FLUORO ESOPHAGRAM (BA SWALLOW)     EXAM DATE/TIME: 03/12/2017 9:45 AM    CLINICAL INDICATION: R10.13: Abdominal pain, epigastric  R13.10: Dysphagia    COMPARISON: None.    FINDINGS:     -SWALLOWING: Normal. No aspiration.  -PHARYNX: Normal appearance.  -ESOPHAGUS:   Strictures: Absent.  Masses: Absent.  Rings: Absent.  Hiatal hernia: None  Morphology: Normal appearance.  Motility: Normal appearance.    IMPRESSION:  Normal study.    EXAMINATION: PET CT TORSO SUBSEQUENT     EXAM DATE/TIME: 05/02/2017 10:17 AM    CLINICAL INFORMATION:  C34.11: Cancer of bronchus of right upper lobe (CMS  HCC)    COMPARISON: PET/CT from September 06, 2016.    RADIATION DOSE: 675 mGycm    Blood glucose prior to injection: 88 mg/dl    DOSE: 17.73 mCi of FDG    FINDINGS:    HEAD and NECK:  Hypermetabolic foci: None.  CT findings:  No masses or adenopathy.    THORAX:  Extensive parenchymal opacity in the right upper lobe is hypermetabolic  with a maximum SUV of 6.1. This is most consistent with radiation  pneumonitis. Amidst the parenchymal disease, a known pulmonary nodule at  the right  apex appears to be grossly unchanged at about 10 mm. Maximum SUV  is 2.5. Again identified is a 9 x 27 mm area of the pleural-based  parenchymal opacity in the left lower lobe which has a maximum SUV of 4,  unchanged in size and metabolic activity when compared to the previous  study. On the CT,  there is associated peripheral interstitial disease was  present dating back to a study from May 2016. This is consistent with  pulmonary fibrosis. It is suspected that this hypermetabolic activity on  the left is associated with some active benign lung disease.    Abdomen/pelvis: No areas of abnormal hypermetabolic activity. No gross CT  abnormality.    MUSCULOSKELETAL:  Hypermetabolic foci: None.  lytic or blastic foci: No lytic or blastic foci.    IMPRESSION:  1. 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  2. Amidst the pneumonitis, the known right apical nodule appears to be grossly unchanged.  3. Area of hypermetabolic activity in the left lower lobe is felt to be related to benign lung disease. There is evidence for pulmonary fibrosis.      CC:  PCP General:  [place] [id number]    Scribe Attestation:  IBarron Barrett, Scribe, scribed for Dr. Holley Dexter on 11:52 01/08/2018    Physician Attestation:  Documentation assistance provided for Mertie Moores, MD by Barron Barrett, SCRIBE. information recorded by the scribe was done at my direction and has been reviewed and validated by me Mertie Moores, MD.

## 2018-01-27 ENCOUNTER — Ambulatory Visit (HOSPITAL_BASED_OUTPATIENT_CLINIC_OR_DEPARTMENT_OTHER): Payer: 59 | Attending: NURSE PRACTITIONER

## 2018-01-27 DIAGNOSIS — I519 Heart disease, unspecified: Secondary | ICD-10-CM

## 2018-01-27 DIAGNOSIS — E039 Hypothyroidism, unspecified: Secondary | ICD-10-CM | POA: Insufficient documentation

## 2018-01-27 LAB — THYROID STIMULATING HORMONE (SENSITIVE TSH): TSH: 0.057 u[IU]/mL — ABNORMAL LOW (ref 0.340–5.330)

## 2018-02-01 ENCOUNTER — Inpatient Hospital Stay
Admission: RE | Admit: 2018-02-01 | Discharge: 2018-02-01 | Disposition: A | Discharge status: Still In Hospital | Payer: 59 | Source: Ambulatory Visit | Attending: Hematology & Oncology | Admitting: Hematology & Oncology

## 2018-02-01 DIAGNOSIS — Z95828 Presence of other vascular implants and grafts: Secondary | ICD-10-CM

## 2018-02-01 DIAGNOSIS — Z452 Encounter for adjustment and management of vascular access device: Secondary | ICD-10-CM | POA: Insufficient documentation

## 2018-02-01 DIAGNOSIS — C3490 Malignant neoplasm of unspecified part of unspecified bronchus or lung: Secondary | ICD-10-CM | POA: Insufficient documentation

## 2018-02-01 LAB — COMPREHENSIVE METABOLIC PROFILE - BMC/JMC ONLY
ALBUMIN/GLOBULIN RATIO: 1 (ref 0.8–2.0)
ALBUMIN: 3.6 g/dL (ref 3.5–5.0)
ALKALINE PHOSPHATASE: 65 U/L (ref 38–126)
ALT (SGPT): 14 U/L (ref 14–54)
ANION GAP: 9 mmol/L (ref 3–11)
AST (SGOT): 23 U/L (ref 15–41)
BILIRUBIN TOTAL: 0.3 mg/dL (ref 0.3–1.2)
BUN/CREA RATIO: 21 (ref 6–22)
BUN: 13 mg/dL (ref 6–20)
CALCIUM: 9.3 mg/dL (ref 8.8–10.2)
CHLORIDE: 104 mmol/L (ref 101–111)
CO2 TOTAL: 23 mmol/L (ref 22–32)
CREATININE: 0.61 mg/dL (ref 0.44–1.00)
ESTIMATED GFR: >60 mL/min/{1.73_m2} (ref >60)
GLUCOSE: 82 mg/dL (ref 70–110)
POTASSIUM: 4 mmol/L (ref 3.4–5.1)
PROTEIN TOTAL: 7.1 g/dL (ref 6.4–8.3)
SODIUM: 136 mmol/L (ref 136–145)

## 2018-02-01 LAB — CBC WITH DIFF
BASOPHIL #: 0.02 x10ˆ3/uL (ref 0.00–0.10)
BASOPHIL %: 1 % (ref 0–3)
EOSINOPHIL #: 0.05 x10ˆ3/uL (ref 0.00–0.50)
EOSINOPHIL %: 1 % (ref 0–5)
HCT: 37 % (ref 36.0–45.0)
HCT: 37 % (ref 36.0–45.0)
HGB: 12.1 g/dL (ref 12.0–15.5)
LYMPHOCYTE #: 1.2 x10ˆ3/uL (ref 1.00–4.80)
LYMPHOCYTE %: 30 % (ref 15–43)
MCH: 29.1 pg (ref 27.5–33.2)
MCHC: 32.7 g/dL (ref 32.0–36.0)
MCHC: 32.7 g/dL (ref 32.0–36.0)
MCV: 89 fL (ref 82.0–97.0)
MONOCYTE #: 0.56 x10ˆ3/uL (ref 0.20–0.90)
MONOCYTE %: 14 % — ABNORMAL HIGH (ref 5–12)
MPV: 7.7 fL (ref 7.4–10.5)
NEUTROPHIL #: 2.17 10*3/uL (ref 1.50–6.50)
NEUTROPHIL %: 54 % (ref 43–76)
PLATELETS: 253 10*3/uL (ref 150–450)
RBC: 4.16 10*6/uL (ref 4.00–5.10)
RDW: 13.6 % (ref 11.0–16.0)
WBC: 4 10*3/uL (ref 4.0–11.0)

## 2018-02-01 LAB — MAGNESIUM: MAGNESIUM: 1.8 mg/dL (ref 1.6–2.6)

## 2018-02-01 MED ORDER — HEPARIN, PORCINE (PF) 100 UNIT/ML INTRAVENOUS SYRINGE
5.0000 mL | INJECTION | Freq: Once | INTRAVENOUS | Status: AC
Start: 2018-02-01 — End: 2018-02-01
  Administered 2018-02-01: 5 mL
  Filled 2018-02-01: qty 5

## 2018-02-01 MED ORDER — SODIUM CHLORIDE 0.9 % (FLUSH) INJECTION SYRINGE
10.0000 mL | INJECTION | Freq: Once | INTRAMUSCULAR | Status: AC
Start: 2018-02-01 — End: 2018-02-01
  Administered 2018-02-01: 10 mL

## 2018-02-01 NOTE — Nurses Notes (Signed)
Patient discharged home.  AVS reviewed with patient/care giver.  A written copy of the AVS and discharge instructions was given to the patient/care giver.  Questions sufficiently answered as needed.  Patient/care giver encouraged to follow up with PCP as indicated.  In the event of an emergency, patient/care giver instructed to call 911 or go to the nearest emergency room.

## 2018-02-01 NOTE — Discharge Instructions (Signed)

## 2018-02-09 ENCOUNTER — Other Ambulatory Visit (INDEPENDENT_AMBULATORY_CARE_PROVIDER_SITE_OTHER): Payer: Self-pay | Admitting: Obstetrics & Gynecology

## 2018-02-09 DIAGNOSIS — Z1239 Encounter for other screening for malignant neoplasm of breast: Secondary | ICD-10-CM

## 2018-02-11 ENCOUNTER — Other Ambulatory Visit (INDEPENDENT_AMBULATORY_CARE_PROVIDER_SITE_OTHER): Payer: Self-pay | Admitting: Obstetrics & Gynecology

## 2018-02-11 DIAGNOSIS — Z79890 Hormone replacement therapy (postmenopausal): Secondary | ICD-10-CM

## 2018-02-11 NOTE — Telephone Encounter (Signed)
Received refill request for Prempro from pt's pharmacy. Pt was last seen in our office on 12/09/2016 by Dr. Illene Bolus. Pt is scheduled for an annual exam on 04/02/2018 with Dr. Illene Bolus. Will send refill request to Dr. Illene Bolus. Judene Companion  02/11/2018, 11:10

## 2018-02-12 ENCOUNTER — Encounter (HOSPITAL_BASED_OUTPATIENT_CLINIC_OR_DEPARTMENT_OTHER): Payer: Self-pay | Admitting: Hematology & Oncology

## 2018-02-12 ENCOUNTER — Ambulatory Visit (HOSPITAL_BASED_OUTPATIENT_CLINIC_OR_DEPARTMENT_OTHER): Payer: 59 | Attending: Hematology & Oncology | Admitting: Hematology & Oncology

## 2018-02-12 VITALS — BP 113/67 | HR 99 | Temp 98.1°F | Resp 18 | Ht 60.98 in | Wt 116.6 lb

## 2018-02-12 DIAGNOSIS — Z79890 Hormone replacement therapy (postmenopausal): Secondary | ICD-10-CM | POA: Insufficient documentation

## 2018-02-12 DIAGNOSIS — Z9221 Personal history of antineoplastic chemotherapy: Secondary | ICD-10-CM | POA: Insufficient documentation

## 2018-02-12 DIAGNOSIS — Z923 Personal history of irradiation: Secondary | ICD-10-CM | POA: Insufficient documentation

## 2018-02-12 DIAGNOSIS — C3490 Malignant neoplasm of unspecified part of unspecified bronchus or lung: Secondary | ICD-10-CM | POA: Insufficient documentation

## 2018-02-12 DIAGNOSIS — E039 Hypothyroidism, unspecified: Secondary | ICD-10-CM | POA: Insufficient documentation

## 2018-02-12 DIAGNOSIS — Z79899 Other long term (current) drug therapy: Secondary | ICD-10-CM | POA: Insufficient documentation

## 2018-02-12 NOTE — Progress Notes (Signed)
[place]  [place] [address]  Operated by [facility]  Return Patient Progress Note    Date: 02/12/2018    Name: Gina Barrett  MRN: [id number]  Referring Physician: No ref. provider found  Primary Care Provider: Lanier Ensign    Reason for visit/consultation Other (f/u for Lung Cancer)    Hem/Onc Diagnosis: Stage IIIB non-small cell adenocarcinoma of the lung     Treatment:   1. S/p chemoradiation with carboplatin + paclitaxel + RT     Interval History:  Gina Barrett is a 62 y.o. female with stage IIIB non-small cell lung cancer who is s/p concurrent chemoRT. Since her last visit, she reports doing well with stable to slightly increase wight of 3 pounds. She has no cardiopulmonary, GI, or GU symptoms. She has not gotten approval for PET CT scan that was due to be completed this month. She did get her Medi-Port flushed on 02/01/18 with labs completed at that time that showed a completely normal CBC and CMP. Her TSH was slightly decreased on 01/27/18 having been increased slightly on 11/19/17.       ROS:   A 12 point review systems was discussed, as per interval history, and otherwise negative.    Past Medical History  Past Medical History:   Diagnosis Date   . Abnormal Pap smear     ASCUS, ASCUS cannot rule out HGSIL   . Cancer (CMS HCC)     non small cell lung cancer (R)-s/p Chemo/Radiation completed 8/18   . Colon polyp    . Dysphagia    . Hypothyroid    . Non-small cell lung cancer, right (CMS [place])     s/p Chemo completed 9/18   . Raynaud's disease    . STD (sexually transmitted disease)     HSV   . Unspecified disorder of thyroid     Hypothyroidism       Current Outpatient Medications   Medication Sig   . cycloSPORINE (RESTASIS) 0.05 % Ophthalmic Dropperette Instill 1 Drop into both eyes Every 12 hours   . docusate sodium (STOOL SOFTENER ORAL) Take by mouth Once per day as needed   .  levothyroxine (SYNTHROID) 88 mcg Oral Tablet take 88 mcg by mouth Once a day.   . loratadine (CLARITIN) 10 mg Oral Tablet take 10 mg by mouth Once a day.   . Nifedipine (ADALAT CC) 30 mg Oral Tablet Sustained Release take 30 mg by mouth Once a day.   . OMEGA-3 FATTY ACIDS (FISH OIL ORAL) take  by mouth Once a day.   Marland Kitchen PREMPRO 0.3-1.5 mg Oral Tablet TAKE ONE TABLET BY MOUTH EVERY DAY          Physical Examination:  BP 113/67   Pulse 99   Temp 36.7 C (98.1 F) (Oral)   Resp 18   Ht 1.549 m (5' 0.98") Comment: *  Wt 52.9 kg (116 lb 9.6 oz)   SpO2 98%   BMI 22.04 kg/m      ECOG Status: 1 - Restricted in physically strenuous activity, but ambulatory and able to carry out work of a light or sedentary nature, e.g., light housework, office work.     General:Appears well, in NAD   HENT: No oral lesions.    Lungs: clear to auscultation bilaterally.   Cardiovascular: RRR without murmur.  Abdomen: soft, non-tender, non-distended, no hepatosplenomegaly or  palpable masses  Extremities: No edema bilaterally  Skin: No clinically significant rashes      Labs:  No results found for this or any previous visit (from the past 36 hour(s)).    Radiology:  No new imaging.     Assessment:   1. Malignant neoplasm of lung, unspecified laterality, unspecified part of lung (CMS [place])  62 y/o woman with stage IIIB non small cell lung cancer who is s/p concurrent chemoradiation and awaiting restaging studies. Her PET CT scan request was denied by insurance resulting in delay. We discussed and I have ordered CT chest abdomen and pelvis with IV contrast to be competed in the next 2-3 weeks. She will return to clinic to discuss and review result. She has no clinical evidence of recurrence and has continued to enjoy improved quality of life since completing therapy. Her Medi-Port is still in place and we discussed removal after CT scan is completed.   - CT CHEST ABDOMEN PELVIS W IV CONTRAST; Future    Return in about 4 weeks (around  03/12/2018).    CC:  PCP General:  [place] [id number]    Scribe Attestation:  I, Janice Coffin, Scribe, scribed for Mertie Moores, MD on 11:43 02/12/2018

## 2018-02-12 NOTE — Nursing Note (Signed)
CT CHEST ABDOMEN PELVIS W IV CONTRAST pt going to [facility].

## 2018-02-16 ENCOUNTER — Encounter (HOSPITAL_BASED_OUTPATIENT_CLINIC_OR_DEPARTMENT_OTHER): Payer: Self-pay | Admitting: Hematology & Oncology

## 2018-02-16 NOTE — Nursing Note (Signed)
CT CHEST ABDOMEN PELVIS W IV CONTRAST faxed order/clinical to [facility] [phone].

## 2018-02-24 ENCOUNTER — Encounter (HOSPITAL_BASED_OUTPATIENT_CLINIC_OR_DEPARTMENT_OTHER): Payer: Self-pay

## 2018-02-24 ENCOUNTER — Ambulatory Visit (HOSPITAL_BASED_OUTPATIENT_CLINIC_OR_DEPARTMENT_OTHER)
Admission: RE | Admit: 2018-02-24 | Discharge: 2018-02-24 | Disposition: A | Discharge status: Home / Self Care | Payer: 59 | Source: Ambulatory Visit | Attending: Obstetrics & Gynecology | Admitting: Obstetrics & Gynecology

## 2018-02-24 DIAGNOSIS — Z1239 Encounter for other screening for malignant neoplasm of breast: Secondary | ICD-10-CM

## 2018-02-24 DIAGNOSIS — Z1231 Encounter for screening mammogram for malignant neoplasm of breast: Secondary | ICD-10-CM | POA: Insufficient documentation

## 2018-02-26 NOTE — Progress Notes (Signed)
Dear [name]    You recent mammogram did not show any mammographic evidence of malignancy or concern. Please continue with periodic breast exams and return to the clinic for your annual to have another mammogram done in one year.    Please call us if you have any questions.      Glendora Score MD, FACOG  Associate Professor  Dept. Of Ob/GYN  [facility].

## 2018-03-05 ENCOUNTER — Encounter (HOSPITAL_BASED_OUTPATIENT_CLINIC_OR_DEPARTMENT_OTHER): Payer: Self-pay | Admitting: Hematology & Oncology

## 2018-03-08 ENCOUNTER — Inpatient Hospital Stay
Admission: RE | Admit: 2018-03-08 | Discharge: 2018-03-08 | Disposition: A | Discharge status: Still In Hospital | Payer: 59 | Source: Ambulatory Visit | Attending: Hematology & Oncology | Admitting: Hematology & Oncology

## 2018-03-08 DIAGNOSIS — C3490 Malignant neoplasm of unspecified part of unspecified bronchus or lung: Secondary | ICD-10-CM

## 2018-03-08 DIAGNOSIS — Z95828 Presence of other vascular implants and grafts: Secondary | ICD-10-CM

## 2018-03-08 DIAGNOSIS — Z452 Encounter for adjustment and management of vascular access device: Secondary | ICD-10-CM | POA: Insufficient documentation

## 2018-03-08 DIAGNOSIS — Z23 Encounter for immunization: Secondary | ICD-10-CM | POA: Insufficient documentation

## 2018-03-08 MED ORDER — HEPARIN, PORCINE (PF) 100 UNIT/ML INTRAVENOUS SYRINGE
5.0000 mL | INJECTION | Freq: Once | INTRAVENOUS | Status: AC
Start: 2018-03-08 — End: 2018-03-08
  Administered 2018-03-08: 5 mL

## 2018-03-08 MED ORDER — SODIUM CHLORIDE 0.9 % (FLUSH) INJECTION SYRINGE
10.0000 mL | INJECTION | Freq: Once | INTRAMUSCULAR | Status: AC
Start: 2018-03-08 — End: 2018-03-08
  Administered 2018-03-08: 10 mL

## 2018-03-08 NOTE — Discharge Instructions (Signed)
Patient Education     Vascular Access Port Implantation  Port implantation is surgery to place (implant) a port under the skin. For vascular access, it's placed into a vein. The port allows medicines or nutrition to be sent right into your bloodstream. Blood can also be taken or given through the port. During the procedure, a long, thin tube (catheter) is threaded into one of your large veins. The tube is then attached to the port. This usually sits under the skin of your chest and causes a small bump. To use the port, a special needle is passed through your skin and into the port. The needle can stay in your skin for up to 7 days, if needed. A port can stay in place for weeks or months or longer.    Why is a vascular access port needed?  A vascular access port may allow healthcare providers to give you:   Chemotherapy or other cancer-fighting medicines   IV treatments, such as antibiotics or nutrition   Hemodialysis for kidney failure  The port may also be used to draw blood.  Before the procedure  Follow any instructions you are given on how to get ready.  Tell your provider about any medicines you are taking. This includes:   All prescription medicines   Over-the-counter medicines such as aspirin or ibuprofen   Herbs, vitamins, and other supplements  Also be sure your provider knows:   If you are pregnant or think you may be pregnant   If you are allergic to any medicines or substances, especially local anesthesia or iodine   Your full health history, including why you will need the port. Also tell your provider if you have a condition that make your blood more likely to form clots.   If you plan on doing any contact sports  During the procedure   Before the procedure, an IV may be put into a vein in your arm or hand. This gives you fluids and medicines. You may be given medicine through the IV to help you relax during the procedure. This is called sedation. But some surgeons place ports using general  anesthesia.   The chest is used most often for the port. In some cases, your belly (abdomen) or arm will be used instead.   The skin over the insertion area is numbed with local anesthesia.   Ultrasound or X-rays are used to help the healthcare provider guide the catheter into the correct place during the procedure.   A cut (incision) is made in the skin where the port will be placed. A small pocket for the port is formed under the skin.   A second small incision is made in the skin near the first incision. A tunnel under the skin is created. The catheter is put through the tunnel and into the blood vessel.   The skin is closed over the port. It's held shut with stitches or surgical glue or tape. The second small incision is also closed.   A chest X-ray may be done to make sure the port is placed correctly.    After the procedure  You may be taken to a recovery room to recover from the sedation. Nurses will check on you as you rest. If you have pain, nurses can give you medicine. If you are not staying in the hospital overnight, you will be sent home a few hours after the procedure is done. A healthcare provider will tell you when you can go home.   An adult family member or friend will need to drive you home.  Recovering at home   Take pain medicine as directed by yourhealthcare provider.   Take it easy for 24 hours after the procedure. Don't do any physical activity or heavy lifting until yourhealthcare providersays it's OK.   Keep the port clean and dry.Ask when you canshower again. You will need to keep the port dry by covering it when you shower.   Care for the insertion site as you are directed.   Don't swim, bathe, or do other activities that cause water to cover the insertion site.   Your port needs to be flushed at least every 4 to 6 weeks to keep it from getting blocked with blood clots. Talk with your healthcare provider about who will do this for you.   In some cases, you may be taught  how to flush it yourself.    Risks and possible complications of implantation   Bleeding   Infection of the insertion site   Damage to a blood vessel   Nerve injury or irritation      Skin breakdown over the port   Rarely, collapsed lung (for chest port placements)    Risks and possible complications of having a port   Blocked port or catheter   Leakage or breakage of the port or catheter   The port moves out of position   Blood clot   Skin or bloodstream infection   Skin breakdown over the port    When to seek medical care  Call your healthcare provider right away if you have any of the following:   A fever of 100.4F (38.0C) or higher, or as directed by your healthcare provider   Swelling, bleeding, or a collection of blood (hematoma) around the port insertion site   The skin near the port is red, warm, swollen, or broken   Shoulder pain or arm numbness, weakness, or tingling on the side where the port is located   You feel a heart flutter or racing heart   Swollen arm, if the port is placed in your arm   Cough, shortness of breath, or trouble taking a full, deep breath  Date Last Reviewed: 06/16/2017   2000-2019 [employer] [address], [place] [address]. All rights reserved. This information is not intended as a substitute for professional medical care. Always follow your healthcare professional's instructions.

## 2018-03-08 NOTE — Nurses Notes (Signed)
Gina Barrett arrived for her scheduled monthly port flush appointment. Left sided port was accessed without difficulty. No redness, swelling, or tenderness was noted at the site. Brisk blood return was noted; flushed with ease. Labs were not ordered. Port was flushed with 79ml NS syringe and then heparinized with 500 units. The port was then de-accessed and a dressing was applied.     AVS reviewed with Patient.  A written copy of the AVS and discharge instructions were given to the patient/care giver.  Questions sufficiently answered as needed.  In the event of an emergency, patient/care giver instructed to call 911 or go to the nearest emergency room. Gina Barrett was discharged from clinic to home via Ambulatory.

## 2018-03-12 ENCOUNTER — Inpatient Hospital Stay (HOSPITAL_BASED_OUTPATIENT_CLINIC_OR_DEPARTMENT_OTHER): Admit: 2018-03-12 | Discharge: 2018-03-12 | Disposition: A | Discharge status: Home / Self Care | Payer: Self-pay

## 2018-03-12 ENCOUNTER — Other Ambulatory Visit (HOSPITAL_BASED_OUTPATIENT_CLINIC_OR_DEPARTMENT_OTHER): Payer: Self-pay

## 2018-03-22 ENCOUNTER — Ambulatory Visit (HOSPITAL_BASED_OUTPATIENT_CLINIC_OR_DEPARTMENT_OTHER): Payer: 59 | Admitting: Hematology & Oncology

## 2018-03-22 ENCOUNTER — Encounter (HOSPITAL_BASED_OUTPATIENT_CLINIC_OR_DEPARTMENT_OTHER): Payer: Self-pay | Admitting: Hematology & Oncology

## 2018-03-22 ENCOUNTER — Inpatient Hospital Stay
Admission: RE | Admit: 2018-03-22 | Discharge: 2018-03-22 | Disposition: A | Discharge status: Still In Hospital | Payer: 59 | Source: Ambulatory Visit | Attending: Hematology & Oncology | Admitting: Hematology & Oncology

## 2018-03-22 VITALS — BP 142/76 | HR 109 | Temp 98.4°F | Resp 28 | Ht 60.98 in | Wt 120.2 lb

## 2018-03-22 DIAGNOSIS — Z86010 Personal history of colonic polyps: Secondary | ICD-10-CM | POA: Insufficient documentation

## 2018-03-22 DIAGNOSIS — E039 Hypothyroidism, unspecified: Secondary | ICD-10-CM | POA: Insufficient documentation

## 2018-03-22 DIAGNOSIS — C3490 Malignant neoplasm of unspecified part of unspecified bronchus or lung: Secondary | ICD-10-CM | POA: Insufficient documentation

## 2018-03-22 DIAGNOSIS — Z79899 Other long term (current) drug therapy: Secondary | ICD-10-CM | POA: Insufficient documentation

## 2018-03-22 DIAGNOSIS — Z923 Personal history of irradiation: Secondary | ICD-10-CM | POA: Insufficient documentation

## 2018-03-22 DIAGNOSIS — Z79890 Hormone replacement therapy (postmenopausal): Secondary | ICD-10-CM | POA: Insufficient documentation

## 2018-03-22 DIAGNOSIS — Z9221 Personal history of antineoplastic chemotherapy: Secondary | ICD-10-CM | POA: Insufficient documentation

## 2018-03-22 DIAGNOSIS — I7300 Raynaud's syndrome without gangrene: Secondary | ICD-10-CM | POA: Insufficient documentation

## 2018-03-22 MED ORDER — FLU VACCINE QS 2019-20(6MOS UP)(PF) 60 MCG(15 MCGX4)/0.5 ML IM SYRINGE
0.5000 mL | INJECTION | Freq: Once | INTRAMUSCULAR | Status: AC
Start: 2018-03-22 — End: 2018-03-22
  Administered 2018-03-22: 0.5 mL via INTRAMUSCULAR
  Filled 2018-03-22: qty 0.5

## 2018-03-22 MED ORDER — PNEUMOCOCCAL 13-VAL CONJ VACCINE-DIP CRM (PF) 0.5 ML IM SYRINGE
0.5000 mL | INJECTION | Freq: Once | INTRAMUSCULAR | Status: AC
Start: 2018-03-22 — End: 2018-03-22
  Administered 2018-03-22: 0.5 mL via INTRAMUSCULAR
  Filled 2018-03-22: qty 0.5

## 2018-03-22 MED ADMIN — pneumococcal 13-val conj vaccine-dip crm (PF) 0.5 mL IM syringe: INTRAMUSCULAR | @ 13:00:00

## 2018-03-22 NOTE — Progress Notes (Signed)
[place], [place]  [place] [address]  Operated by [facility]  Return Patient Progress Note    Date: 03/22/2018    Name: Gina Barrett  MRN: [id number]  Referring Physician: No ref. 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 S/p chemoradiation with carboplatin + paclitaxel + RT     Interval History:  Gina Barrett is a 62 y.o. female with stage IIIB non-small cell lung cancer who is s/p concurrent chemoradiation completed end of August 2019. She returns to clinic today for evaluation. She had surveillance CT [facility] completed at [facility] [place] on 03/12/18 which showed overall stable findings with no evidence of progression of disease. Pt did not bring the CD of this imaging studies for review or scanning into our PACS system. Symptomatically she still has rare intermittent cough for which she is using aloe solution at night with some relief. Her cough remains non productive and not associated with any fever.       ROS:   A 12 point review systems was discussed, as per interval history, and otherwise negative.    Past Medical History  Past Medical History:   Diagnosis Date   . Abnormal Pap smear     ASCUS, ASCUS cannot rule out HGSIL   . Cancer (CMS HCC)     non small cell lung cancer (R)-s/p Chemo/Radiation completed 8/18   . Colon polyp    . Dysphagia    . Hypothyroid    . Non-small cell lung cancer, right (CMS [place])     s/p Chemo completed 9/18   . Raynaud's disease    . STD (sexually transmitted disease)     HSV   . Unspecified disorder of thyroid     Hypothyroidism       Current Outpatient Medications   Medication Sig   . cycloSPORINE (RESTASIS) 0.05 % Ophthalmic Dropperette Instill 1 Drop into both eyes Every 12 hours   .  docusate sodium (STOOL SOFTENER ORAL) Take by mouth Once per day as needed   . levothyroxine (SYNTHROID) 75 mcg Oral Tablet Take 75 mcg by mouth Every morning   . loratadine (CLARITIN) 10 mg Oral Tablet take 10 mg by mouth Once a day.   . Nifedipine (ADALAT CC) 30 mg Oral Tablet Sustained Release take 30 mg by mouth Once a day.   . OMEGA-3 FATTY ACIDS (FISH OIL ORAL) take  by mouth Once a day.   Marland Kitchen PREMPRO 0.3-1.5 mg Oral Tablet TAKE ONE TABLET BY MOUTH EVERY DAY          Physical Examination:  BP (!) 142/76   Pulse (!) 109   Temp 36.9 C (98.4 F) (Oral)   Resp (!) 28   Ht 1.549 m (5' 0.98")   Wt 54.5 kg (120 lb 3.2 oz)   SpO2 100% Comment: r/a  BMI 22.72 kg/m      ECOG Status: 1 - Restricted in physically strenuous activity, but ambulatory and able to carry out work of a light or sedentary nature, e.g., light housework, office work.   General:Appears well, in NAD   HENT: No oral lesions.    Lungs: clear to auscultation bilaterally.   Cardiovascular: RRR without murmur.  Abdomen: soft, non-tender, non-distended, no hepatosplenomegaly or palpable masses  Extremities: No edema bilaterally  Skin: No clinically significant rashes      Labs:  No results found for this or any previous visit (from the past 36 hour(s)).  CBC from 02/01/18 is normal.   CMP from the same date is normal.     Radiology:  Report of CT scan chest, abdomen, and pelvis from radiology scanned into media.  Bilateral screening mammogram from 02/24/18 is normal.     Assessment:   1. Malignant neoplasm of lung, unspecified laterality, unspecified part of lung (CMS [place])  62 y/o woman who continues to do clinically well without any evidence or progression of disease on CT scan of chest, abdomen, and pelvis on 03/12/18. Her insurance company has declined PET CT scan that was requested. I reviewed report with her and discussed continuing to survey and repeat imaging in about 6 months. She will see radiation oncology within the week. I informed her that  he scant intermittent cough is likely due to radiation changes.     Return in about 3 months (around 06/22/2018).    CC:  PCP General:  [place] [id number]    Scribe Attestation:  I, Janice Coffin, Scribe, scribed for Mertie Moores, MD on 11:09 03/22/2018     Physician Attestation:  Documentation assistance provided for Mertie Moores, MD by Janice Coffin, Ida. information recorded by the scribe was done at my direction and has been reviewed and validated by me Mertie Moores, MD

## 2018-04-02 ENCOUNTER — Ambulatory Visit (INDEPENDENT_AMBULATORY_CARE_PROVIDER_SITE_OTHER): Payer: Self-pay | Admitting: Obstetrics & Gynecology

## 2018-04-02 ENCOUNTER — Ambulatory Visit (HOSPITAL_BASED_OUTPATIENT_CLINIC_OR_DEPARTMENT_OTHER): Payer: 59 | Attending: NURSE PRACTITIONER

## 2018-04-02 DIAGNOSIS — E039 Hypothyroidism, unspecified: Secondary | ICD-10-CM | POA: Insufficient documentation

## 2018-04-02 DIAGNOSIS — I519 Heart disease, unspecified: Secondary | ICD-10-CM

## 2018-04-02 LAB — THYROID STIMULATING HORMONE (SENSITIVE TSH): TSH: 2.925 u[IU]/mL (ref 0.340–5.330)

## 2018-04-06 ENCOUNTER — Inpatient Hospital Stay
Admission: RE | Admit: 2018-04-06 | Discharge: 2018-04-06 | Disposition: A | Discharge status: Still In Hospital | Payer: 59 | Source: Ambulatory Visit | Attending: Hematology & Oncology | Admitting: Hematology & Oncology

## 2018-04-06 DIAGNOSIS — Z452 Encounter for adjustment and management of vascular access device: Secondary | ICD-10-CM | POA: Insufficient documentation

## 2018-04-06 DIAGNOSIS — Z95828 Presence of other vascular implants and grafts: Secondary | ICD-10-CM

## 2018-04-06 MED ORDER — SODIUM CHLORIDE 0.9 % (FLUSH) INJECTION SYRINGE
10.0000 mL | INJECTION | Freq: Once | INTRAMUSCULAR | Status: AC
Start: 2018-04-06 — End: 2018-04-06
  Administered 2018-04-06: 10 mL

## 2018-04-06 MED ORDER — HEPARIN, PORCINE (PF) 100 UNIT/ML INTRAVENOUS SYRINGE
5.0000 mL | INJECTION | Freq: Once | INTRAVENOUS | Status: AC
Start: 2018-04-06 — End: 2018-04-06
  Administered 2018-04-06: 5 mL
  Filled 2018-04-06: qty 5

## 2018-04-06 MED ADMIN — heparin, porcine (PF) 100 unit/mL intravenous syringe: @ 10:00:00

## 2018-04-06 NOTE — Discharge Instructions (Signed)

## 2018-04-06 NOTE — Nurses Notes (Signed)
Patient discharged home.  AVS reviewed with patient.  A written copy of the AVS and discharge instructions was given to the patient.  Questions sufficiently answered as needed.  Patient encouraged to follow up with PCP as indicated.  In the event of an emergency, patient instructed to call 911 or go to the nearest emergency room.

## 2018-04-15 ENCOUNTER — Other Ambulatory Visit (HOSPITAL_BASED_OUTPATIENT_CLINIC_OR_DEPARTMENT_OTHER): Payer: Self-pay

## 2018-04-20 ENCOUNTER — Telehealth (INDEPENDENT_AMBULATORY_CARE_PROVIDER_SITE_OTHER): Payer: Self-pay | Admitting: Family

## 2018-04-20 NOTE — Telephone Encounter (Signed)
States she had some labs couple weeks ago and is interested in thyroid resullt

## 2018-04-22 NOTE — Telephone Encounter (Signed)
Sorry I cannot find the result in [place]. Can you see if it is in her paper chart. Thank you

## 2018-04-27 ENCOUNTER — Encounter (HOSPITAL_BASED_OUTPATIENT_CLINIC_OR_DEPARTMENT_OTHER): Payer: Self-pay | Admitting: Radiology

## 2018-05-05 ENCOUNTER — Inpatient Hospital Stay
Admission: RE | Admit: 2018-05-05 | Discharge: 2018-05-05 | Disposition: A | Discharge status: Still In Hospital | Payer: 59 | Source: Ambulatory Visit | Attending: Hematology & Oncology | Admitting: Hematology & Oncology

## 2018-05-05 DIAGNOSIS — Z452 Encounter for adjustment and management of vascular access device: Secondary | ICD-10-CM | POA: Insufficient documentation

## 2018-05-05 DIAGNOSIS — Z95828 Presence of other vascular implants and grafts: Secondary | ICD-10-CM

## 2018-05-05 MED ORDER — SODIUM CHLORIDE 0.9 % (FLUSH) INJECTION SYRINGE
10.00 mL | INJECTION | Freq: Once | INTRAMUSCULAR | Status: AC
Start: 2018-05-05 — End: 2018-05-05
  Administered 2018-05-05: 10 mL

## 2018-05-05 MED ORDER — HEPARIN, PORCINE (PF) 100 UNIT/ML INTRAVENOUS SYRINGE
5.00 mL | INJECTION | Freq: Once | INTRAVENOUS | Status: AC
Start: 2018-05-05 — End: 2018-05-05
  Administered 2018-05-05: 5 mL
  Filled 2018-05-05: qty 5

## 2018-05-05 NOTE — Discharge Instructions (Signed)
Patient Education     Vascular Access Port Implantation  Port implantation is surgery to place (implant) a port under the skin. For vascular access, it's placed into a vein. The port allows medicines or nutrition to be sent right into your bloodstream. Blood can also be taken or given through the port. During the procedure, a long, thin tube (catheter) is threaded into one of your large veins. The tube is then attached to the port. This usually sits under the skin of your chest and causes a small bump. To use the port, a special needle is passed through your skin and into the port. The needle can stay in your skin for up to 7 days, if needed. A port can stay in place for weeks or months or longer.    Why is a vascular access port needed?  A vascular access port may allow healthcare providers to give you:   Chemotherapy or other cancer-fighting medicines   IV treatments, such as antibiotics or nutrition   Hemodialysis for kidney failure  The port may also be used to draw blood.  Before the procedure  Follow any instructions you are given on how to get ready.  Tell your provider about any medicines you are taking. This includes:   All prescription medicines   Over-the-counter medicines such as aspirin or ibuprofen   Herbs, vitamins, and other supplements  Also be sure your provider knows:   If you are pregnant or think you may be pregnant   If you are allergic to any medicines or substances, especially local anesthesia or iodine   Your full health history, including why you will need the port. Also tell your provider if you have a condition that make your blood more likely to form clots.   If you plan on doing any contact sports  During the procedure   Before the procedure, an IV may be put into a vein in your arm or hand. This gives you fluids and medicines. You may be given medicine through the IV to help you relax during the procedure. This is called sedation. But some surgeons place ports using general  anesthesia.   The chest is used most often for the port. In some cases, your belly (abdomen) or arm will be used instead.   The skin over the insertion area is numbed with local anesthesia.   Ultrasound or X-rays are used to help the healthcare provider guide the catheter into the correct place during the procedure.   A cut (incision) is made in the skin where the port will be placed. A small pocket for the port is formed under the skin.   A second small incision is made in the skin near the first incision. A tunnel under the skin is created. The catheter is put through the tunnel and into the blood vessel.   The skin is closed over the port. It's held shut with stitches or surgical glue or tape. The second small incision is also closed.   A chest X-ray may be done to make sure the port is placed correctly.    After the procedure  You may be taken to a recovery room to recover from the sedation. Nurses will check on you as you rest. If you have pain, nurses can give you medicine. If you are not staying in the hospital overnight, you will be sent home a few hours after the procedure is done. A healthcare provider will tell you when you can go home.   An adult family member or friend will need to drive you home.  Recovering at home   Take pain medicine as directed by yourhealthcare provider.   Take it easy for 24 hours after the procedure. Don't do any physical activity or heavy lifting until yourhealthcare providersays it's OK.   Keep the port clean and dry.Ask when you canshower again. You will need to keep the port dry by covering it when you shower.   Care for the insertion site as you are directed.   Don't swim, bathe, or do other activities that cause water to cover the insertion site.   Your port needs to be flushed at least every 4 to 6 weeks to keep it from getting blocked with blood clots. Talk with your healthcare provider about who will do this for you.   In some cases, you may be taught  how to flush it yourself.    Risks and possible complications of implantation   Bleeding   Infection of the insertion site   Damage to a blood vessel   Nerve injury or irritation      Skin breakdown over the port   Rarely, collapsed lung (for chest port placements)    Risks and possible complications of having a port   Blocked port or catheter   Leakage or breakage of the port or catheter   The port moves out of position   Blood clot   Skin or bloodstream infection   Skin breakdown over the port    When to seek medical care  Call your healthcare provider right away if you have any of the following:   A fever of 100.4F (38.0C) or higher, or as directed by your healthcare provider   Swelling, bleeding, or a collection of blood (hematoma) around the port insertion site   The skin near the port is red, warm, swollen, or broken   Shoulder pain or arm numbness, weakness, or tingling on the side where the port is located   You feel a heart flutter or racing heart   Swollen arm, if the port is placed in your arm   Cough, shortness of breath, or trouble taking a full, deep breath  Date Last Reviewed: 06/16/2017   2000-2019 [employer] [address], [place] [address]. All rights reserved. This information is not intended as a substitute for professional medical care. Always follow your healthcare professional's instructions.

## 2018-05-05 NOTE — Nurses Notes (Signed)
Gina Barrett arrived for her scheduled monthly port flush appointment. Left sided port was accessed without difficulty. No redness, swelling, or tenderness was noted at the site. Brisk blood return was noted; flushed with ease. Labs were not ordered. Port was flushed with 50ml NS syringe and then heparinized with 500 units. The port was then de-accessed and a dressing was applied.     AVS reviewed with Patient.  A written copy of the AVS and discharge instructions were given to the patient/care giver.  Questions sufficiently answered as needed.  In the event of an emergency, patient/care giver instructed to call 911 or go to the nearest emergency room. Gina Barrett was discharged from clinic to home via Ambulatory.

## 2018-05-06 ENCOUNTER — Other Ambulatory Visit: Payer: Self-pay | Admitting: Hematology & Oncology

## 2018-05-06 ENCOUNTER — Encounter (HOSPITAL_BASED_OUTPATIENT_CLINIC_OR_DEPARTMENT_OTHER): Payer: Self-pay | Admitting: Hematology & Oncology

## 2018-05-06 DIAGNOSIS — Z95828 Presence of other vascular implants and grafts: Secondary | ICD-10-CM

## 2018-05-06 NOTE — Nursing Note (Signed)
IR PORT REMOVAL sent to IR.

## 2018-05-06 NOTE — Nurses Notes (Signed)
Verbal order for port removal received from Dr. Holley Dexter. Order placed.

## 2018-05-07 ENCOUNTER — Encounter (INDEPENDENT_AMBULATORY_CARE_PROVIDER_SITE_OTHER): Payer: Self-pay | Admitting: Obstetrics & Gynecology

## 2018-05-07 ENCOUNTER — Other Ambulatory Visit (HOSPITAL_BASED_OUTPATIENT_CLINIC_OR_DEPARTMENT_OTHER): Payer: 59 | Attending: Obstetrics & Gynecology

## 2018-05-07 ENCOUNTER — Other Ambulatory Visit (HOSPITAL_BASED_OUTPATIENT_CLINIC_OR_DEPARTMENT_OTHER): Payer: Self-pay | Admitting: Obstetrics & Gynecology

## 2018-05-07 ENCOUNTER — Ambulatory Visit (INDEPENDENT_AMBULATORY_CARE_PROVIDER_SITE_OTHER): Payer: 59 | Admitting: Obstetrics & Gynecology

## 2018-05-07 VITALS — BP 120/64 | HR 60 | Temp 97.9°F | Ht 61.0 in | Wt 120.6 lb

## 2018-05-07 DIAGNOSIS — N888 Other specified noninflammatory disorders of cervix uteri: Secondary | ICD-10-CM

## 2018-05-07 DIAGNOSIS — Z01419 Encounter for gynecological examination (general) (routine) without abnormal findings: Secondary | ICD-10-CM

## 2018-05-07 DIAGNOSIS — N898 Other specified noninflammatory disorders of vagina: Secondary | ICD-10-CM

## 2018-05-07 DIAGNOSIS — Z87891 Personal history of nicotine dependence: Secondary | ICD-10-CM

## 2018-05-07 DIAGNOSIS — Z923 Personal history of irradiation: Secondary | ICD-10-CM

## 2018-05-07 DIAGNOSIS — Z85118 Personal history of other malignant neoplasm of bronchus and lung: Secondary | ICD-10-CM

## 2018-05-07 DIAGNOSIS — Z9221 Personal history of antineoplastic chemotherapy: Secondary | ICD-10-CM

## 2018-05-07 DIAGNOSIS — Z1151 Encounter for screening for human papillomavirus (HPV): Secondary | ICD-10-CM

## 2018-05-07 NOTE — Progress Notes (Signed)
[id number]  05/07/2018  Gina Barrett    Gina Barrett is a 62 y.o. female who presents for annual exam.  Patient with a history of non-small cell lung cancer status post chemoradiation completed in 2018. Had a recent PET scan in 2018 was noted to be negative for any recurrence.  Has had subsequent CT scans of the chest that she being followed with her colleges.  She denies any postmenopausal bleeding.  Patient is not on any hormone replacement therapy.  Has been concerned about some vaginal dryness and discomfort with sexual activity.  Recently more active and would like something for this.  Denies any incontinence or prolapse symptoms.  Patient prior Pap smears have all been normal.  Prior last Pap smear was 2000 and 15 was noted to be normal with negative HPV.  Patient requests if she can have a repeat Pap smear today given her history of some non-small cell lung cancer.  Mammogram in September noted to be normal.  Has not felt anything of concern.  Has a PCP is following up for other health needs.  She has received vaccinations as well as had colonoscopies.  She is due to get a DEXA scan.    Past Medical History:   Diagnosis Date   . Abnormal Pap smear     ASCUS, ASCUS cannot rule out HGSIL   . Cancer (CMS HCC)     non small cell lung cancer (R)-s/p Chemo/Radiation completed 8/18   . Colon polyp    . Dysphagia    . Hypothyroid    . Non-small cell lung cancer, right (CMS [place])     s/p Chemo completed 9/18   . Raynaud's disease    . STD (sexually transmitted disease)     HSV   . Unspecified disorder of thyroid     Hypothyroidism         Past Surgical History:   Procedure Laterality Date   . COLONOSCOPY N/A 03/08/2014    Performed by Duayne Cal, DO at [facility]   . GASTROSCOPY WITH BIOPSY N/A 03/30/2017    Performed by Duayne Cal, DO at [facility] 3 OPERATING ROOM   . HX CATARACT REMOVAL Bilateral 2011   . HX COLONOSCOPY  2009   . HX ELBOW SURGERY     . HX FOOT SURGERY      . HX TUBAL LIGATION  1978   . LUNG BIOPSY Right 10/02/2016   . MEDIASTINOSCOPY  11/13/2016   . PB FOREARM/WRIST SURGERY UNLISTED  1999    carpal tunnel release   . PORTACATH PLACEMENT  12/12/2016     cycloSPORINE (RESTASIS) 0.05 % Ophthalmic Dropperette, Instill 1 Drop into both eyes Every 12 hours  levothyroxine (SYNTHROID) 75 mcg Oral Tablet, Take 75 mcg by mouth Every morning  loratadine (CLARITIN) 10 mg Oral Tablet, take 10 mg by mouth Once a day.  Nifedipine (ADALAT CC) 30 mg Oral Tablet Sustained Release, take 30 mg by mouth Once a day.  OMEGA-3 FATTY ACIDS (FISH OIL ORAL), take  by mouth Once a day.  PREMPRO 0.3-1.5 mg Oral Tablet, TAKE ONE TABLET BY MOUTH EVERY DAY  docusate sodium (STOOL SOFTENER ORAL), Take by mouth Once per day as needed    No facility-administered medications prior to visit.     Review of Systems - negative to review except HPI  Family Medical History:     Problem Relation (Age of Onset)    Breast Cancer Mother  Diabetes Paternal Grandfather, Brother    No Known Problems Father, Sister, Maternal Grandmother, Maternal Grandfather, Paternal 20, Daughter, Son, Maternal Aunt, Maternal Uncle, Paternal 80, Paternal Uncle, Other            Social History     Socioeconomic History   . Marital status: Married     Spouse name: Not on file   . Number of children: Not on file   . Years of education: Not on file   . Highest education level: Not on file   Occupational History     Employer: [employer]   Tobacco Use   . Smoking status: Former Smoker     Years: 20.00     Types: Cigarettes     Start date: 12/09/1970     Last attempt to quit: 09/08/1998     Years since quitting: 19.6   . Smokeless tobacco: Never Used   Substance and Sexual Activity   . Alcohol use: No   . Drug use: No   . Sexual activity: Yes     Partners: Male     Birth control/protection: None   Other Topics Concern   . Breast Self Exam Yes   . Calcium intake adequate No   . Ability to Walk 1 Flight of Steps without  SOB/CP Yes   . Ability To Do Own ADL's Yes       Objective:     Vitals:    05/07/18 1027   BP: 120/64   Pulse: 60   Temp: 36.6 C (97.9 F)   Weight: 54.7 kg (120 lb 9.6 oz)   Height: 1.549 m (5\' 1" )   BMI: 22.83         General:  Alert, cooperative, no distress, appears stated age.   Back:   Symmetric, no curvature. ROM normal. No CVA tenderness.   Lungs:   Clear to auscultation bilaterally.   Heart:  Regular rate and rhythm, S1, S2 normal, no murmur, click, rub or gallop.   Breast Exam:  No tenderness,or nipple abnormality. No adenopathy.no masses. No skin changes   Abdomen:   Soft, non-tender. Bowel sounds normal. No masses,  No organomegaly.   Genitalia:  Normal VBUS. No adenopathy. No leak of urine with valsalva.    no prolapse,vaginal walls normal in continued presence of 3 polypoid lesions over the vaginal walls as noted before.  They appear to be unchanged however no size documented in the past.  Will consider getting a ultrasound to assess better., cervix visually normal. Uterus anteverted,normal sized,no adenexal masses         Assessment/plan   Annual Female well visit.    Diagnosis explained in detail, including differential.  All questions answered.  Neurosurgeon distributed.  Discussed healthy lifestyle modifications.  Plan: Health maintenance topics   a. Osteoporosis prevention: discussed vit D/calcium,  b. Obesity Nutrition, exercise and weight management:   Regular exercise: discussed   Healthy diet: discussed   Weight management: discussed   c. Tobacco cessation: n/a   d. Cancer screening:   Breast cancer screening: performs SBE   Mammograms:  Ordered for next visit.  Cervical cancer screening: PAP repeated today along with HPV testing.  Patient with a history of non-small-cell carcinoma.  Colorectal cancer screening:  Per PCP.  e. Lab testing: cholesterol, DM screening and TSH per PCP  f. Vaccination:  Discussed will need shingles vaccine flu vaccine as well as Pneumo vaccine.  Will get at  PCP.  Would have patient get a pelvic ultrasound to assess vaginal polyps in detail.  PET scan in 2018 November did not show any evidence of pickup in that area.  Suspect that these are benign.  Does been no interval change in the area since last couple of exams.    Follow up PRN or annual

## 2018-05-09 DIAGNOSIS — Z85118 Personal history of other malignant neoplasm of bronchus and lung: Secondary | ICD-10-CM | POA: Insufficient documentation

## 2018-05-09 NOTE — Progress Notes (Signed)
Subjective:    Patient ID: Melody Hendricks is a 62 y.o. female.    Patient here for evaluation of sinus congestion and cough.  Patient does have a history of right upper lobe non-small celladenocarcinoma with lymph node involvement. She was diagnosed in June of 2018. She has since finished her radiation and has had multiple "clean CT of her lungs/ches"t. Finished chemo/radiation August 2018. She has a follow up with hem/onc on 06/02/2018 for port flush and a follow up in April.   O2 sat rechecked and was 97% on RA.    Sinusitis   This is a new problem. The current episode started 1 to 4 weeks ago. The problem has been gradually worsening since onset. There has been no fever. Her pain is at a severity of 0/10. She is experiencing no pain. Associated symptoms include congestion, coughing, a hoarse voice and sinus pressure. Past treatments include oral decongestants. The treatment provided no relief.       The following portions of the patient's history were reviewed and updated as appropriate: allergies, current medications, past family history, past medical history, past social history, past surgical history and problem list.    Review of Systems   HENT: Positive for congestion, hoarse voice and sinus pressure.    Respiratory: Positive for cough.    All other systems reviewed and are negative.          Objective:    Physical Exam  Vitals signs reviewed.   Constitutional:       Appearance: Normal appearance. She is normal weight.   HENT:      Head: Normocephalic and atraumatic.      Right Ear: Ear canal normal.      Left Ear: Ear canal normal.      Nose: Congestion and rhinorrhea present.      Right Turbinates: Swollen.      Left Turbinates: Swollen.      Right Sinus: Maxillary sinus tenderness present.      Left Sinus: Maxillary sinus tenderness present.      Comments: Bilateral TM with serous effusion, TM intact bilateral  Eyes:      Conjunctiva/sclera: Conjunctivae normal.      Comments: PND, with mild tonsil  hypertrophy with erythema without exudate   Cardiovascular:      Rate and Rhythm: Normal rate and regular rhythm.   Pulmonary:      Effort: Respiratory distress present.      Breath sounds: No wheezing.   Chest:      Chest wall: Tenderness (over right ribs) present.   Abdominal:      General: Abdomen is flat. Bowel sounds are normal.      Palpations: Abdomen is soft.   Lymphadenopathy:      Cervical: No cervical adenopathy.   Skin:     General: Skin is warm and dry.   Neurological:      General: No focal deficit present.      Mental Status: She is alert.   Psychiatric:         Mood and Affect: Mood normal.         Behavior: Behavior normal.             Assessment:       1. Acute maxillary sinusitis, recurrence not specified    2. Cough with sputum    3. History of cancer of upper lobe bronchus or lung          Plan:  1. ABX therapy given with instruction to finish full course to prevent reoccurrence  2. Tessalon Perles given for cough with instruction. Will check CXR if symptoms worsen or persists  3. Keep already scheduled appointment with HEM/ONC   4. Follow up as already scheduled next month here for Chronic medical check

## 2018-05-10 ENCOUNTER — Ambulatory Visit (INDEPENDENT_AMBULATORY_CARE_PROVIDER_SITE_OTHER): Payer: Commercial Managed Care - POS | Admitting: Family

## 2018-05-10 ENCOUNTER — Encounter (INDEPENDENT_AMBULATORY_CARE_PROVIDER_SITE_OTHER): Payer: Self-pay | Admitting: Family

## 2018-05-10 VITALS — BP 110/70 | HR 61 | Temp 97.8°F | Resp 16 | Ht 61.0 in | Wt 122.0 lb

## 2018-05-10 DIAGNOSIS — Z85118 Personal history of other malignant neoplasm of bronchus and lung: Secondary | ICD-10-CM

## 2018-05-10 DIAGNOSIS — R058 Other specified cough: Secondary | ICD-10-CM

## 2018-05-10 DIAGNOSIS — R05 Cough: Secondary | ICD-10-CM

## 2018-05-10 DIAGNOSIS — I7300 Raynaud's syndrome without gangrene: Secondary | ICD-10-CM

## 2018-05-10 DIAGNOSIS — J0100 Acute maxillary sinusitis, unspecified: Secondary | ICD-10-CM

## 2018-05-10 LAB — HPV RNA, HIGH RISK - BMC/JMC ONLY: HPV RNA PCR: NEGATIVE

## 2018-05-10 MED ORDER — AZITHROMYCIN 250 MG PO TABS
250.0000 mg | ORAL_TABLET | Freq: Every day | ORAL | 0 refills | Status: DC
Start: ? — End: 2018-05-10

## 2018-05-10 MED ORDER — BENZONATATE 100 MG PO CAPS
100.0000 mg | ORAL_CAPSULE | Freq: Three times a day (TID) | ORAL | 1 refills | Status: DC | PRN
Start: ? — End: 2018-05-10

## 2018-05-18 ENCOUNTER — Ambulatory Visit (HOSPITAL_BASED_OUTPATIENT_CLINIC_OR_DEPARTMENT_OTHER): Payer: 59 | Attending: NURSE PRACTITIONER

## 2018-05-18 DIAGNOSIS — I7300 Raynaud's syndrome without gangrene: Secondary | ICD-10-CM

## 2018-05-18 LAB — COMPREHENSIVE METABOLIC PROFILE - BMC/JMC ONLY
ALBUMIN/GLOBULIN RATIO: 1 (ref 0.8–2.0)
ALBUMIN: 3.7 g/dL (ref 3.5–5.0)
ALKALINE PHOSPHATASE: 63 U/L (ref 38–126)
ALT (SGPT): 14 U/L (ref 14–54)
ANION GAP: 7 mmol/L (ref 3–11)
AST (SGOT): 21 U/L (ref 15–41)
BILIRUBIN TOTAL: 0.3 mg/dL (ref 0.3–1.2)
BUN/CREA RATIO: 13 (ref 6–22)
BUN: 9 mg/dL (ref 6–20)
CALCIUM: 9.6 mg/dL (ref 8.8–10.2)
CHLORIDE: 107 mmol/L (ref 101–111)
CO2 TOTAL: 26 mmol/L (ref 22–32)
CREATININE: 0.71 mg/dL (ref 0.44–1.00)
ESTIMATED GFR: >60 mL/min/1.73mˆ2 (ref >60)
GLUCOSE: 90 mg/dL (ref 70–110)
POTASSIUM: 4.6 mmol/L (ref 3.4–5.1)
PROTEIN TOTAL: 7.4 g/dL (ref 6.4–8.3)
SODIUM: 140 mmol/L (ref 136–145)

## 2018-05-18 LAB — LIPID PANEL
CHOL/HDL RATIO: 4.5
CHOLESTEROL: 220 mg/dL — ABNORMAL HIGH (ref 120–199)
HDL CHOL: 49 mg/dL (ref >39)
LDL CALC: 158 mg/dL — ABNORMAL HIGH (ref <=130)
TRIGLYCERIDES: 65 mg/dL (ref <150)
VLDL CALC: 13 mg/dL (ref 5–35)

## 2018-05-20 LAB — HISTORICAL CYTOPATHOLOGY-GYN (PAP AND HPV TESTS)

## 2018-05-21 ENCOUNTER — Inpatient Hospital Stay (HOSPITAL_BASED_OUTPATIENT_CLINIC_OR_DEPARTMENT_OTHER)
Admission: RE | Admit: 2018-05-21 | Discharge: 2018-05-21 | Disposition: A | Discharge status: Home / Self Care | Payer: 59 | Source: Ambulatory Visit | Attending: Hematology & Oncology | Admitting: Hematology & Oncology

## 2018-05-21 ENCOUNTER — Other Ambulatory Visit (HOSPITAL_BASED_OUTPATIENT_CLINIC_OR_DEPARTMENT_OTHER): Payer: Self-pay | Admitting: Diagnostic Radiology

## 2018-05-21 DIAGNOSIS — Z95828 Presence of other vascular implants and grafts: Secondary | ICD-10-CM

## 2018-05-21 DIAGNOSIS — Z452 Encounter for adjustment and management of vascular access device: Secondary | ICD-10-CM | POA: Insufficient documentation

## 2018-05-21 MED ORDER — LIDOCAINE 1 %-EPINEPHRINE 1:100,000 INJECTION SOLUTION
15.0000 mL | INTRAMUSCULAR | Status: AC
Start: 2018-05-21 — End: 2018-05-21
  Administered 2018-05-21: 25 mL via INTRAMUSCULAR
  Filled 2018-05-21: qty 20

## 2018-05-21 NOTE — Progress Notes (Signed)
Dear Gina Barrett    Your PAP smear at your recent Gyn visit was normal.  Your high risk HPV test was also normal.  You will have to continue with routine annual screening.    Please follow up in 1 year for your next annual visit.    Thank you   Dr. Illene Bolus

## 2018-05-21 NOTE — Nurses Notes (Signed)
1400 Patient arrived to IR room 4 for [facility] removal. Patient alert and oriented x3. Allergies and medications reviewed.   [id number] Procedure discussed with patient including procedural medications with potential side effects, expected length of time and recovery process, application of dressing. Explained potential complications to report i.e. bleeding and signs of infection (fever, redness, wound drainage, increased pain). Consent and detailed discharge instructions reviewed and signed. Time allowed to answer any questions.  1411 Right chest prepped with chloroprep and sterile drape applied.  1440 Dr Marcell Anger in room, time-out performed. Lidocaine with epinephrine administered.   [place] removed successfully.   1448 Sutures, dermabond, steri-strips, gauze, and tegaderm applied to site.   1456 Patient discharged to main entrance in no distress with all personal belongings,  provided AVS summary. Dressing CDI.

## 2018-05-26 ENCOUNTER — Ambulatory Visit (INDEPENDENT_AMBULATORY_CARE_PROVIDER_SITE_OTHER): Payer: Self-pay | Admitting: Family

## 2018-05-27 ENCOUNTER — Inpatient Hospital Stay (HOSPITAL_BASED_OUTPATIENT_CLINIC_OR_DEPARTMENT_OTHER)
Admission: RE | Admit: 2018-05-27 | Discharge: 2018-05-27 | Disposition: A | Discharge status: Home / Self Care | Payer: 59 | Source: Ambulatory Visit | Attending: Diagnostic Radiology | Admitting: Diagnostic Radiology

## 2018-05-27 DIAGNOSIS — Z4801 Encounter for change or removal of surgical wound dressing: Secondary | ICD-10-CM | POA: Insufficient documentation

## 2018-05-27 DIAGNOSIS — Z95828 Presence of other vascular implants and grafts: Secondary | ICD-10-CM

## 2018-05-27 NOTE — Nurses Notes (Signed)
Patient to IR room 1 for site check post left chest port removal last week. Old dressing removed. Incision scabbed over. Steri strips still intact. No signs or symptoms of infection noted. Site left open to air. Patient left unit in no distress with all personal belongings.

## 2018-05-28 ENCOUNTER — Ambulatory Visit (HOSPITAL_BASED_OUTPATIENT_CLINIC_OR_DEPARTMENT_OTHER): Payer: 59

## 2018-06-01 ENCOUNTER — Ambulatory Visit (HOSPITAL_BASED_OUTPATIENT_CLINIC_OR_DEPARTMENT_OTHER)
Admission: RE | Admit: 2018-06-01 | Discharge: 2018-06-01 | Disposition: A | Discharge status: Home / Self Care | Payer: 59 | Source: Ambulatory Visit | Attending: Obstetrics & Gynecology | Admitting: Obstetrics & Gynecology

## 2018-06-01 DIAGNOSIS — N898 Other specified noninflammatory disorders of vagina: Secondary | ICD-10-CM | POA: Insufficient documentation

## 2018-06-01 DIAGNOSIS — Z780 Asymptomatic menopausal state: Secondary | ICD-10-CM | POA: Insufficient documentation

## 2018-06-01 DIAGNOSIS — R9389 Abnormal findings on diagnostic imaging of other specified body structures: Secondary | ICD-10-CM | POA: Insufficient documentation

## 2018-06-01 DIAGNOSIS — D251 Intramural leiomyoma of uterus: Secondary | ICD-10-CM | POA: Insufficient documentation

## 2018-06-02 ENCOUNTER — Encounter (INDEPENDENT_AMBULATORY_CARE_PROVIDER_SITE_OTHER): Payer: Self-pay

## 2018-06-02 ENCOUNTER — Inpatient Hospital Stay: Payer: 59

## 2018-06-03 ENCOUNTER — Ambulatory Visit (INDEPENDENT_AMBULATORY_CARE_PROVIDER_SITE_OTHER): Payer: Commercial Managed Care - POS | Admitting: Family

## 2018-06-03 ENCOUNTER — Encounter (INDEPENDENT_AMBULATORY_CARE_PROVIDER_SITE_OTHER): Payer: Self-pay | Admitting: Family

## 2018-06-03 VITALS — BP 118/62 | HR 95 | Temp 97.8°F | Resp 18 | Ht 61.0 in | Wt 120.0 lb

## 2018-06-03 DIAGNOSIS — I7300 Raynaud's syndrome without gangrene: Secondary | ICD-10-CM

## 2018-06-03 DIAGNOSIS — E7800 Pure hypercholesterolemia, unspecified: Secondary | ICD-10-CM

## 2018-06-03 DIAGNOSIS — C3400 Malignant neoplasm of unspecified main bronchus: Secondary | ICD-10-CM

## 2018-06-03 DIAGNOSIS — E785 Hyperlipidemia, unspecified: Secondary | ICD-10-CM | POA: Insufficient documentation

## 2018-06-03 DIAGNOSIS — E079 Disorder of thyroid, unspecified: Secondary | ICD-10-CM | POA: Insufficient documentation

## 2018-06-03 DIAGNOSIS — C801 Malignant (primary) neoplasm, unspecified: Secondary | ICD-10-CM | POA: Insufficient documentation

## 2018-06-03 DIAGNOSIS — Z1159 Encounter for screening for other viral diseases: Secondary | ICD-10-CM

## 2018-06-03 NOTE — Progress Notes (Signed)
Subjective:    Patient ID: Melody Hendricks is a 62 y.o. female.    Here to follow up on   Pure hypercholesterolemia  Disorder of thyroid  Raynaud's disease without gangrene  Malignant neoplasm of the lung    Patient recent lipid panel was as follows:CHOLESTEROL 120 - 199 mg/dL [id number]    HDL CHOL >78 mg/dL 49   TRIGLYCERIDES <469 mg/dL 65   LDL CALC <=629 mg/dL [id number]    VLDL CALC 5 - 35 mg/dL 13   CHOL/HDL RATIO  4.5   Her CV risk was at 10.8%.  Patient not currently on a statin drug.  Her lipid profile 6 months earlier was better and did not require patient to be on statin therapy.  Patient wishes to try lifestyle modification to see if she can get cholesterol back down to normal.  She is currently taking levothyroxine 75 MCG's daily.  She denies any hyper/hypo-thyroid symptoms.  Her recent TSH was 2.9.  Her Raynaund's is well controlled with nifedipine ER 30 mg daily she denies any any pain in her limbs.  She denies any ulcerations of her fingertips or toes.  She has completed both radiation and chemotherapy for her lung cancer.  She has a follow-up with oncology in April 2020.  She is not having any lung complaints at this time.  She is currently seeing a gynecologist and is scheduled to have a mammogram today.  She states she is aware that she is due for a DEXA scan and plans to schedule that next year.          The following portions of the patient's history were reviewed and updated as appropriate: allergies, current medications, past family history, past medical history, past social history, past surgical history and problem list.    Review of Systems   Constitutional: Negative for activity change, appetite change, chills, fatigue and fever.   HENT: Negative for sinus pressure and sinus pain.    Eyes: Negative for visual disturbance.   Respiratory: Negative for chest tightness and shortness of breath.    Cardiovascular: Negative for chest pain.   Gastrointestinal: Negative for abdominal pain,  constipation, diarrhea, nausea and vomiting.   Endocrine: Negative for polydipsia and polyphagia.   Genitourinary: Negative for difficulty urinating.   Musculoskeletal: Negative for arthralgias.   Skin: Negative for rash.   Neurological: Negative for headaches.   Psychiatric/Behavioral: Negative for suicidal ideas.   All other systems reviewed and are negative.        Objective:      BP 118/62   Pulse 95   Temp 97.8 F (36.6 C)   Resp 18   Ht 1.549 m (5\' 1" )   Wt 54.4 kg (120 lb)   SpO2 97%   BMI 22.67 kg/m     Physical Exam  Vitals signs reviewed.   Constitutional:       Appearance: Normal appearance.   HENT:      Head: Normocephalic and atraumatic.      Right Ear: Tympanic membrane normal.      Left Ear: Tympanic membrane normal.      Mouth/Throat:      Pharynx: Oropharynx is clear.   Eyes:      Extraocular Movements: Extraocular movements intact.      Conjunctiva/sclera: Conjunctivae normal.      Pupils: Pupils are equal, round, and reactive to light.   Neck:      Musculoskeletal: Normal range of motion.   Cardiovascular:  Rate and Rhythm: Normal rate and regular rhythm.      Pulses: Normal pulses.      Heart sounds: Normal heart sounds. No murmur.   Pulmonary:      Effort: Pulmonary effort is normal. No respiratory distress.      Breath sounds: Normal breath sounds. No wheezing.   Abdominal:      General: Abdomen is flat. Bowel sounds are normal.      Palpations: There is no mass.      Tenderness: There is no abdominal tenderness.   Musculoskeletal: Normal range of motion.   Skin:     General: Skin is warm.      Capillary Refill: Capillary refill takes less than 2 seconds.      Comments: Steri strips over left chest wall without s/s of infection. This was the previous site of her porta cath that was removed last week   Neurological:      General: No focal deficit present.      Mental Status: She is alert and oriented to person, place, and time.   Psychiatric:         Attention and Perception:  Attention normal.         Speech: Speech normal.         Behavior: Behavior normal.         Thought Content: Thought content normal.         Cognition and Memory: Cognition normal.           Assessment:       1. Need for hepatitis C screening test    2. Pure hypercholesterolemia    3. Disorder of thyroid    4. Raynaud's disease without gangrene    5. Malignant neoplasm of hilus of lung, unspecified laterality          Plan:       1.  Lifestyle modification to help improve cholesterol discussed.  Patient will consider statin therapy if cholesterol stays elevated.   2.  Continue Synthroid at current dose  3.  Continue nifedipine 30 mg.  Stressed the importance of patient to check her skin and report any ulcerations or changes.  Told to keep hands and feet warm and cold temperatures  4.  Follow-up with oncology as scheduled in April  5.  Check hepatitis C screening.  Encouraged patient to keep regular follow-ups with her gynecologist.  Colonoscopy is up-to-date she is established with a gastroenterologist.

## 2018-06-03 NOTE — Patient Instructions (Signed)
Lipid Panel  Does this test have other names?  Lipid profile, lipoprotein profile  What is this test?  This group of tests measures the amount of cholesterol and other fats in your blood.  Cholesterol and triglycerides are lipids, or fats. These fats are important for cell health, but they can be harmful when they build up in the blood. Sometimes they can lead to clogged, inflamed arteries, a condition call atherosclerosis. This may keep your heart from working normally if the arteries of your heart muscle are affected.  This panel of tests helps predict your risk for heart disease and stroke.  A lipid panel measures these fats:   Total cholesterol   LDL ("bad") cholesterol   HDL ("good") cholesterol   Triglycerides, another type of fat that causes hardening of the arteries  Why do I need this test?  You may need this panel of tests if you have a family history of heart disease or stroke.  You may also have this test if your healthcare provider believes you're at risk for heart disease. These are risk factors:   High blood pressure   Diabetes or prediabetes   Overweight or obesity   Smoking   Lack of exercise   Diet of unhealthy foods   Stress   High total cholesterol  If you are already being treated for heart disease, you may have this test to see whether treatment is working.  What other tests might I have along with this test?  Your healthcare provider may also order other tests to look at how well your heart is working. These tests may include:   Electrocardiogram, or ECG, which tests your heart's electrical impulses to see if it is beating normally   Stress test, in which you may have to exercise while being monitored by ECG   Echocardiogram, which uses sound waves to make pictures of your heart   Cardiac catheterization. For this test, a healthcare provider puts a tube into your blood vessels and injects dye. X-rays are then done to look for clogs in the arteries of the heart.  Your  provider may also order tests for high blood pressure or blood sugar, or glucose.  What do my test results mean?  Test results may vary depending on your age, gender, health history, the method used for the test, and other things. Your test results may not mean you have a problem. Ask your healthcare provider what your test results mean for you.  Results are given in milligrams per deciliter (mg/dL). Here are the ranges for total cholesterol in adults:   Normal: Less than 200 mg/dL   Borderline high: 200 to 239 mg/dL   High: At or above 240 mg/dL  These are the adult ranges for LDL cholesterol:   Optimal: Less than 100 mg/dL (This is the goal for people with diabetes or heart disease.)   Near optimal: 100 to 129 mg/dL   Borderline high: 130 to 159 mg/dL   High: 160 to 189 mg/dL   Very high: 190 mg/dL and higher  The above numbers are general guidelines, because actual goals depend on the number of risk factors you have for heart disease.  Your HDL cholesterol levels should be above 40 mg/dL. This type of fat is actually good for you because it lowers your risk of heart disease. The higher the number, the lower your risk. Sixty mg/dL or above is considered the level to protect you against heart disease.   High levels of   triglycerides are linked with a higher heart disease risk. Here are the adult ranges:   Normal: Less than 150 mg/dL   Borderline high: 150 to 199 mg/dL   High: 200 to 499 mg/dL   Very high: Above 500 mg/dL  Depending on your test results, your healthcare provider will decide whether you need lifestyle changes or medicines to lower your cholesterol.  Your results and targets will vary according to your age and health. If you have high blood pressure or diabetes, you're at higher risk of having heart disease. You may have to take medicine to get your cholesterol and triglyceride levels even lower.  How is this test done?  The test is done with a blood sample, which is drawn through a  needle from a vein in your arm.  Does this test pose any risks?  Having a blood test with a needle carries some risks. These include bleeding, infection, bruising, and feeling lightheaded. When the needle pricks your arm or hand, you may feel a slight sting or pain. Afterward, the site may be sore.  What might affect my test results?  Being sick or under stress, and taking certain medicines canaffect your results.  What you eat, how often you exercise, and whether you smoke can also affect your lipid profile.  How do I prepare for the test?  You may need to not eat or drink anything but water for 12 to 14 hours before this test. In addition, be sure your healthcare provider knows about all medicines, herbs, vitamins, and supplements you are taking. This includes medicines that don't need a prescription and any illicit drugs you may use.   2000-2019 [employer] [address], [place] [address]. All rights reserved. This information is not intended as a substitute for professional medical care. Always follow your healthcare professional's instructions.

## 2018-06-14 ENCOUNTER — Encounter (INDEPENDENT_AMBULATORY_CARE_PROVIDER_SITE_OTHER): Payer: Self-pay | Admitting: Family

## 2018-06-14 ENCOUNTER — Ambulatory Visit (INDEPENDENT_AMBULATORY_CARE_PROVIDER_SITE_OTHER): Payer: Commercial Managed Care - POS | Admitting: Family

## 2018-06-14 DIAGNOSIS — Z20828 Contact with and (suspected) exposure to other viral communicable diseases: Secondary | ICD-10-CM

## 2018-06-14 DIAGNOSIS — J069 Acute upper respiratory infection, unspecified: Secondary | ICD-10-CM

## 2018-06-14 LAB — POCT INFLUENZA A/B
POCT Rapid Influenza A AG: NEGATIVE
POCT Rapid Influenza B AG: NEGATIVE

## 2018-06-14 NOTE — Progress Notes (Signed)
Subjective:    Patient ID: Melody Hendricks is a 62 y.o. female.    Here for evaluation for cough with body aches. States was exposed to the flu over Christmas. She did get her flu vaccine this year.    Cough   This is a recurrent problem. The current episode started 1 to 4 weeks ago. The problem has been waxing and waning. The problem occurs every few hours. The cough is productive of purulent sputum. Pertinent negatives include no chills, ear pain, sore throat or sweats. Associated symptoms comments: Body aches. Nothing aggravates the symptoms. Risk factors: H/O lung cancer. She has tried rest and OTC cough suppressant for the symptoms. The treatment provided mild relief. lung cancer       The following portions of the patient's history were reviewed and updated as appropriate: allergies, current medications, past family history, past medical history, past social history, past surgical history and problem list.    Review of Systems   Constitutional: Negative for chills.        Body aches   HENT: Negative for ear pain and sore throat.    Respiratory: Positive for cough.            Objective:    Physical Exam  Vitals signs reviewed.   Constitutional:       General: She is not in acute distress.     Appearance: Normal appearance. She is normal weight.   HENT:      Head: Normocephalic and atraumatic.      Right Ear: Tympanic membrane normal.      Left Ear: Tympanic membrane normal.      Nose: Congestion present.      Mouth/Throat:      Pharynx: Oropharynx is clear.   Eyes:      Extraocular Movements: Extraocular movements intact.      Conjunctiva/sclera: Conjunctivae normal.      Pupils: Pupils are equal, round, and reactive to light.   Neck:      Musculoskeletal: Normal range of motion and neck supple.   Cardiovascular:      Rate and Rhythm: Normal rate and regular rhythm.      Pulses: Normal pulses.      Heart sounds: Normal heart sounds. No murmur.   Pulmonary:      Effort: Pulmonary effort is normal. No  respiratory distress.      Breath sounds: Normal breath sounds. No wheezing.   Abdominal:      General: Abdomen is flat. Bowel sounds are normal.      Palpations: Abdomen is soft.   Musculoskeletal: Normal range of motion.   Skin:     General: Skin is warm.      Capillary Refill: Capillary refill takes less than 2 seconds.   Neurological:      Mental Status: She is alert and oriented to person, place, and time.   Psychiatric:         Attention and Perception: Attention normal.         Mood and Affect: Mood normal.         Speech: Speech normal.         Behavior: Behavior normal.         Thought Content: Thought content normal.         Cognition and Memory: Cognition normal.         Judgment: Judgment normal.             Assessment:  1. Viral upper respiratory tract infection    2. Exposure to the flu          Plan:       1. Reassurance given. No ABX needed. Conservative treatment  2. Influenza swab was negative

## 2018-09-06 ENCOUNTER — Encounter (INDEPENDENT_AMBULATORY_CARE_PROVIDER_SITE_OTHER): Payer: Self-pay | Admitting: Family

## 2018-09-06 ENCOUNTER — Ambulatory Visit (INDEPENDENT_AMBULATORY_CARE_PROVIDER_SITE_OTHER): Payer: Commercial Managed Care - POS | Admitting: Family

## 2018-09-06 VITALS — BP 134/80 | HR 63 | Temp 98.2°F | Resp 18 | Ht 61.0 in | Wt 124.6 lb

## 2018-09-06 DIAGNOSIS — J0100 Acute maxillary sinusitis, unspecified: Secondary | ICD-10-CM

## 2018-09-06 DIAGNOSIS — Z87891 Personal history of nicotine dependence: Secondary | ICD-10-CM

## 2018-09-06 MED ORDER — DOXYCYCLINE HYCLATE 100 MG PO TABS
100.0000 mg | ORAL_TABLET | Freq: Two times a day (BID) | ORAL | 0 refills | Status: DC
Start: ? — End: 2018-09-06

## 2018-09-06 NOTE — Patient Instructions (Signed)
Sinus Headache    The sinuses are air-filled spaces in the bones of the face. They connect to the inside of the nose. Sinusitis is an inflammation of the tissue lining the sinus cavities. Sinus inflammation can occur during a cold or hay fever (allergies to pollens and other particles in the air). It can cause symptoms of sinus congestion and fullness and perhaps a low-grade fever. An infection is often present when there is also facial pain or headache and green or yellow drainage from the nose or into the back of the throat (postnasal drip). Antibiotics may be prescribed to treat this condition.   Sinus headache may cause pain in different places, depending on which sinuses are infected. There may be pain in the temples, forehead, top of the head, behind or around the eye, across the cheekbone, or into the upper teeth.   You may find that changing your position will bring some relief. Try sitting upright or lying down.   Home care  These guidelines will help you care for yourself at home:    Drink plenty of water, hot tea, and other liquids to stay well hydrated. This thins the mucus and helps your sinuses drain.   Apply heat to the painful areas of the face. Use a towel soaked in hot water. Or stand in the shower with the hot spray on your face. This is a good way to breathe in warm water vapor and get heat on your face at the same time. Cover your mouth and nose with your hands so you can still breathe as you do this.   Use a cool mist vaporizer at night. Suck on peppermint, menthol, or eucalyptus hard candies during the day.   An over-the-counter expectorant containing guaifenesin helps thin the mucus. It also helps your sinuses drain.   You may use over-the-counter decongestants unless a similar medicine was prescribed. Nasal sprays or drops work the fastest. Use one that contains phenylephrine or oxymetazoline. First blow your nose gently to remove mucus. Then apply the spray or drops. Don't use  decongestant nasal sprays or drops more often than the label says or for more than 3 days. This can make symptoms worse. Nasal sprays or drops prescribed by your healthcare provider typically don't have these limits. Check with your provider or pharmacist. You may also use tablets that have pseudoephedrine. Side effects from oral decongestants tend to be worse than with nasal sprays or drops, and may keep you from using them. Many sinus remedies combine ingredients, which may increase side effects. Also, if you are taking a combination medicine with another medicine, be sure you are not taking a double dose of anything by mistake. Read the labels or ask the pharmacist for help. Talk with your provider before using decongestants if you have high blood pressure, heart disease, glaucoma, or prostate trouble.   Antihistamines may help if allergies are causing your sinusitis. You can get chlorpheniramine and diphenhydramine over the counter, but these can make you drowsy. Don't use these if you have glaucoma or if you are a man with trouble urinating due to an enlarged prostate. Over-the-counter antihistamines containing loratadine and cetirizine cause less drowsiness. They may be a better choice for daytime use.   When allergies cause your sinusitis, a saline nasal rinse may give relief. A saline nasal rinse reduces swelling and clears excess mucus. This lets the sinuses drain. Packaged kits are sold at most pharmacies. These contain premixed salt packets and an irrigation device. If   antibiotics have been prescribed to treat an acute sinus infection, talk with your provider before using a nasal rinse. This is to be sure it is safe for you.   You may use over-the-counter medicine to control pain and fever, unless another pain medicine was prescribed. Talk with your provider before using acetaminophen or ibuprofen if you have chronic liver or kidney disease. Also talk with your provider if you have ever had a stomach  ulcer. Aspirin should never be used by anyone younger than 19 unless directed by a provider. It may cause a life-threatening condition called Reye syndrome.   If antibiotics were given, finish all of them, even if you are feeling better after a few days.  Follow-up care  Follow up with your healthcare provider, or as advised, if your symptoms aren't better in 1 week.   Call 911  Call 911 if any of these occur:    Abnormal drowsiness or confusion   Swelling of the forehead or eyelids   Vision problems including blurred or double vision   Seizure   Feeling of doom   Trouble breathing   Feeling dizzy   Loss of consciousness or fainting  When to get medical advice  Call your healthcare provider right away if any of these occur:   Facial pain or headache becomes worse   Stiff neck   Fever of 100.4F (38C) or higher, or as directed by your healthcare provider   Bleeding from the nose or throat  [employer] last reviewed this educational content on 02/14/2018   2000-2020 [employer] [address], [place] [address]. All rights reserved. This information is not intended as a substitute for professional medical care. Always follow your healthcare professional's instructions.

## 2018-09-06 NOTE — Progress Notes (Signed)
Subjective:    Patient ID: Melody Hendricks is a 63 y.o. female.    Here for evaluation for sinusitis.  No recent travel    Sinusitis   This is a new problem. The current episode started in the past 7 days. The problem has been gradually worsening since onset. There has been no fever. Her pain is at a severity of 5/10. The pain is mild. Associated symptoms include coughing, headaches and sinus pressure. Pertinent negatives include no chills or shortness of breath. Past treatments include oral decongestants. The treatment provided no relief.       The following portions of the patient's history were reviewed and updated as appropriate: allergies, current medications, past family history, past medical history, past social history, past surgical history and problem list.    Review of Systems   Constitutional: Negative for activity change, appetite change, chills, fatigue and fever.   HENT: Positive for sinus pressure. Negative for sinus pain.    Eyes: Negative for visual disturbance.   Respiratory: Positive for cough. Negative for chest tightness and shortness of breath.    Cardiovascular: Negative for chest pain.   Gastrointestinal: Negative for abdominal pain, constipation, diarrhea, nausea and vomiting.   Endocrine: Negative for polydipsia and polyphagia.   Genitourinary: Negative for difficulty urinating.   Musculoskeletal: Negative for arthralgias.   Skin: Negative for rash.   Neurological: Positive for headaches.   Psychiatric/Behavioral: Negative for suicidal ideas.   All other systems reviewed and are negative.          Objective:    Physical Exam  Vitals signs reviewed.   Constitutional:       Appearance: Normal appearance.   HENT:      Head: Normocephalic and atraumatic.      Right Ear: External ear normal. A middle ear effusion is present.      Left Ear: External ear normal. A middle ear effusion is present.      Nose:      Right Turbinates: Enlarged and swollen.      Left Turbinates: Enlarged and  swollen.      Right Sinus: Maxillary sinus tenderness present.      Left Sinus: Maxillary sinus tenderness present.      Mouth/Throat:      Mouth: Mucous membranes are moist.      Pharynx: Oropharynx is clear.   Eyes:      Extraocular Movements: Extraocular movements intact.      Conjunctiva/sclera: Conjunctivae normal.      Pupils: Pupils are equal, round, and reactive to light.   Neck:      Musculoskeletal: Normal range of motion and neck supple.   Cardiovascular:      Rate and Rhythm: Normal rate and regular rhythm.      Pulses: Normal pulses.      Heart sounds: Normal heart sounds.   Pulmonary:      Effort: Pulmonary effort is normal. No respiratory distress.      Breath sounds: Normal breath sounds.   Abdominal:      General: Bowel sounds are normal.      Palpations: Abdomen is soft.   Musculoskeletal: Normal range of motion.   Skin:     General: Skin is warm.      Capillary Refill: Capillary refill takes less than 2 seconds.   Neurological:      General: No focal deficit present.      Mental Status: She is alert and oriented to person, place, and time.  Psychiatric:         Attention and Perception: Attention normal.         Mood and Affect: Mood normal.         Speech: Speech normal.         Behavior: Behavior normal.         Thought Content: Thought content normal.         Cognition and Memory: Cognition normal.         Judgment: Judgment normal.             Assessment:       1. Acute non-recurrent maxillary sinusitis          Plan:       1. Doxy given with instruction. Increase fluids and rest   Follow up prn

## 2018-09-17 ENCOUNTER — Other Ambulatory Visit: Payer: Self-pay

## 2018-09-20 ENCOUNTER — Ambulatory Visit (HOSPITAL_BASED_OUTPATIENT_CLINIC_OR_DEPARTMENT_OTHER): Payer: 59 | Attending: Hematology & Oncology | Admitting: Hematology & Oncology

## 2018-09-20 DIAGNOSIS — C3490 Malignant neoplasm of unspecified part of unspecified bronchus or lung: Secondary | ICD-10-CM

## 2018-09-20 NOTE — Progress Notes (Signed)
HEMATOLOGY/ONCOLOGY, [place], [place]  [place] [address]  Operated by [facility]  Video Visit     Name: Gina Barrett  MRN: [id number]    Date: 09/20/2018  Age: 63 y.o.                             Patient's location: [place] [address]   Patient/family aware of provider location: Yes  Patient/family consent for video visit: Yes  Interview and observation performed by: Leia Alf, SCRIBE    Chief Complaint: Lung Cancer  History of Present Illness:    Gina Barrett is a 63 y.o. female with stage IIIB non-small cell lung cancer who is s/p concurrent chemo radiation completed end of August 2019. She is doing well and has no new complaints except for her stable intermittent cough but no Hemoptysis , dyspnea, or chest pain. She was recently treated with doxycyline for sinus infection. She is taking all the appropriate COVID-19 precautions. She has a right thoracic wall area of intermittent pain which is not new and is now requiring analgesic.      ROS: All the review of systems are negative except as indicated in interval history above.     Past Medical History  Past Medical History:   Diagnosis Date   . Abnormal Pap smear     ASCUS, ASCUS cannot rule out HGSIL   . Cancer (CMS HCC)     non small cell lung cancer (R)-s/p Chemo/Radiation completed 8/18   . Colon polyp    . Dysphagia    . Hypothyroid    . Non-small cell lung cancer, right (CMS [place])     s/p Chemo completed 9/18   . Raynaud's disease    . STD (sexually transmitted disease)     HSV   . Unspecified disorder of thyroid     Hypothyroidism       Current Outpatient Medications   Medication Sig   . cycloSPORINE (RESTASIS) 0.05 % Ophthalmic Dropperette Instill 1 Drop into both eyes Every 12 hours   . levothyroxine (SYNTHROID) 75 mcg Oral Tablet Take 75 mcg by mouth Every morning   . loratadine (CLARITIN) 10 mg Oral Tablet take 10 mg by mouth Once a day.   . Nifedipine (ADALAT CC) 30 mg Oral Tablet  Sustained Release take 30 mg by mouth Once a day.   . OMEGA-3 FATTY ACIDS (FISH OIL ORAL) take  by mouth Once a day.   Marland Kitchen PREMPRO 0.3-1.5 mg Oral Tablet TAKE ONE TABLET BY MOUTH EVERY DAY        Physical Examination:  There were no vitals taken for this visit.     Labs:  No results found for this or any previous visit (from the past 36 hour(s)).    Radiology:  Report of CT scan chest, abdomen, and pelvis from radiology scanned into media.  Bilateral screening mammogram from 02/24/18 is normal.     Observational Exam:   General:Appears well, in NAD   Exam Deferred.     Data Reviewed:   CT from 03/12/18 reviewed    Assessment/Plan:  1. Malignant neoplasm of lung, unspecified laterality, unspecified part of lung (CMS [place])  63 y/o woman with stage IIIB none small cell lung cancer who complete concurrent chemo radiation 8 months ago and remains in clinical remission. We have planned to repeat CT of chest, abdomen and pelvis this month  which we will defer until June/July 2020. This test has been ordered and pt will return to clinic for exam and review of imaging studies in July.  She will obtain chemistry to ensure normal renal function before CT is done.         ICD-10-CM    1. Malignant neoplasm of lung, unspecified laterality, unspecified part of lung (CMS HCC) C34.90 CT CHEST ABDOMEN PELVIS W IV CONTRAST     Orders Placed This Encounter   . CT CHEST ABDOMEN PELVIS W IV CONTRAST     Follow Up:  3 months.    Leia Alf, SCRIBE    Scribe Attestation:  I, Leia Alf, Scribe, scribed for Dr.Rhona Fusilier on 11:29 09/20/2018     Physician Attestation:  Documentation assistance provided for Dr.Aoi Kouns by Leia Alf, SCRIBE. information recorded by the scribe was done at my direction and has been reviewed and validated by me Dr.Siyon Linck.

## 2018-11-24 ENCOUNTER — Other Ambulatory Visit: Payer: Self-pay

## 2018-11-24 ENCOUNTER — Ambulatory Visit: Payer: 59 | Attending: NURSE PRACTITIONER

## 2018-11-24 DIAGNOSIS — Z1159 Encounter for screening for other viral diseases: Secondary | ICD-10-CM | POA: Insufficient documentation

## 2018-11-24 DIAGNOSIS — E079 Disorder of thyroid, unspecified: Secondary | ICD-10-CM | POA: Insufficient documentation

## 2018-11-24 LAB — THYROID STIMULATING HORMONE (SENSITIVE TSH): TSH: 2.477 u[IU]/mL (ref 0.340–5.330)

## 2018-11-24 LAB — HEPATITIS C ANTIBODY SCREEN WITH REFLEX TO HCV PCR: HCV ANTIBODY QUALITATIVE: NEGATIVE

## 2018-12-02 ENCOUNTER — Encounter (INDEPENDENT_AMBULATORY_CARE_PROVIDER_SITE_OTHER): Payer: Self-pay | Admitting: Family

## 2018-12-02 ENCOUNTER — Other Ambulatory Visit (INDEPENDENT_AMBULATORY_CARE_PROVIDER_SITE_OTHER): Payer: Self-pay

## 2018-12-02 ENCOUNTER — Ambulatory Visit (INDEPENDENT_AMBULATORY_CARE_PROVIDER_SITE_OTHER): Payer: Commercial Managed Care - POS | Admitting: Family

## 2018-12-02 ENCOUNTER — Other Ambulatory Visit: Payer: Self-pay

## 2018-12-02 ENCOUNTER — Other Ambulatory Visit (HOSPITAL_COMMUNITY): Payer: Self-pay | Admitting: NURSE PRACTITIONER

## 2018-12-02 ENCOUNTER — Ambulatory Visit
Admission: RE | Admit: 2018-12-02 | Discharge: 2018-12-02 | Disposition: A | Discharge status: Home / Self Care | Payer: 59 | Source: Ambulatory Visit | Attending: NURSE PRACTITIONER | Admitting: NURSE PRACTITIONER

## 2018-12-02 ENCOUNTER — Ambulatory Visit: Payer: 59

## 2018-12-02 VITALS — BP 130/70 | HR 80 | Temp 99.0°F | Resp 16 | Ht 61.0 in | Wt 123.0 lb

## 2018-12-02 DIAGNOSIS — C3491 Malignant neoplasm of unspecified part of right bronchus or lung: Secondary | ICD-10-CM

## 2018-12-02 DIAGNOSIS — M79642 Pain in left hand: Secondary | ICD-10-CM

## 2018-12-02 DIAGNOSIS — E7800 Pure hypercholesterolemia, unspecified: Secondary | ICD-10-CM

## 2018-12-02 DIAGNOSIS — Z9221 Personal history of antineoplastic chemotherapy: Secondary | ICD-10-CM

## 2018-12-02 DIAGNOSIS — I7300 Raynaud's syndrome without gangrene: Secondary | ICD-10-CM

## 2018-12-02 DIAGNOSIS — E039 Hypothyroidism, unspecified: Secondary | ICD-10-CM

## 2018-12-02 DIAGNOSIS — M79641 Pain in right hand: Secondary | ICD-10-CM

## 2018-12-02 DIAGNOSIS — Z9229 Personal history of other drug therapy: Secondary | ICD-10-CM

## 2018-12-02 DIAGNOSIS — Z780 Asymptomatic menopausal state: Secondary | ICD-10-CM

## 2018-12-02 LAB — RHEUMATOID FACTOR, SERUM: RHEUMATOID FACTOR: 256 [IU]/mL — ABNORMAL HIGH (ref <=30)

## 2018-12-02 MED ORDER — LEVOTHYROXINE SODIUM 75 MCG PO TABS
75.0000 ug | ORAL_TABLET | Freq: Every day | ORAL | 1 refills | Status: DC
Start: ? — End: 2018-12-02

## 2018-12-02 MED ORDER — CYCLOSPORINE 0.05 % OP EMUL
1.00 [drp] | Freq: Two times a day (BID) | OPHTHALMIC | 3 refills | Status: DC
Start: 2018-12-02 — End: 2019-03-28

## 2018-12-02 MED ORDER — NIFEDIPINE ER 30 MG PO TB24
30.00 mg | ORAL_TABLET | Freq: Every day | ORAL | 3 refills | Status: DC
Start: 2018-12-02 — End: 2020-01-19

## 2018-12-02 NOTE — Progress Notes (Signed)
Subjective:    Patient ID: Melody Hendricks is a 63 y.o. female.    Here to follow up on  Pure hypercholesterolemia    Acquired hypothyroidism  Raynaud's disease without gangrene  Non-small cell lung cancer, right  Bilateral hand pain    Patient continue to do lifestyle modification to help improve her cholesterol.Her last CV risk was 10.8 %. She will be due to recheck her lipid panel in 3 months. She is currently not taking a statin at this time. Her goal is to not take medication for this condition.  Her hypothyroid has been stable. Her most recent TSh was 2.4. She is taking synthroid daily. She denies any hypo or hyper thyroid symptoms.  Raynaud's disease is stable on Adalat in combination with protecting skin from cool temperatures. She hasnot noticed any skin breakdown or ulcerations.   She has completed chemotherapy and radiation for her right non-small cell lung cancer at the end of August. Saw her oncologist in April, Dr. Lorrin Jackson, cancer was reported as in remission. A CT scan of chest, ABD and pelvic was ordered to be completed next month. She is to follow up with the specialist in 3 months.  Saw her GYN for an annual exam in November 2019. During the visit she had concerns of vaginal dryness. She was started on a cream, which she does not recall the name.   Her mammogram was normal, pap smear NEGATIVE FOR INTRAEPITHELIAL LESION OR MALIGNANCY. REACTIVE CELLULAR CHANGES. She is due for DEXA. Needs order.  Her hepatitis C screening was negative.  Complains of bilateral hand pain without injury. Concerned about arthritis. She is right hand dominant. Having trouble opening jars and doors      Hand Pain    The incident occurred more than 1 week ago. The incident occurred at home. There was no injury mechanism. The pain is present in the right hand and left hand. The quality of the pain is described as aching. The pain does not radiate. The pain is at a severity of 1/10. The pain is mild. The pain has been  intermittent since the incident. Associated symptoms include muscle weakness (right weaker than left). Pertinent negatives include no chest pain, numbness or tingling. Exacerbated by: twisting to open jars. She has tried nothing for the symptoms. The treatment provided no relief.       The following portions of the patient's history were reviewed and updated as appropriate: allergies, current medications, past family history, past medical history, past social history, past surgical history and problem list.    Review of Systems   Constitutional: Negative for activity change, appetite change, chills, fatigue and fever.   HENT: Negative for sinus pressure and sinus pain.    Eyes: Negative for visual disturbance.   Respiratory: Negative for chest tightness and shortness of breath.    Cardiovascular: Negative for chest pain.   Gastrointestinal: Negative for abdominal pain, constipation, diarrhea, nausea and vomiting.   Endocrine: Negative for polydipsia and polyphagia.   Genitourinary: Negative for difficulty urinating.   Musculoskeletal: Negative for arthralgias.        Bilateral hand pain   Skin: Negative for rash.   Neurological: Negative for tingling, numbness and headaches.   Psychiatric/Behavioral: Negative for suicidal ideas.   All other systems reviewed and are negative.          Objective:    Physical Exam  Vitals signs reviewed.   Constitutional:       Appearance: Normal appearance.  HENT:      Head: Normocephalic and atraumatic.      Right Ear: Tympanic membrane, ear canal and external ear normal.      Left Ear: Tympanic membrane, ear canal and external ear normal.      Nose: Nose normal.      Mouth/Throat:      Mouth: Mucous membranes are moist.      Pharynx: Oropharynx is clear.   Eyes:      Extraocular Movements: Extraocular movements intact.      Conjunctiva/sclera: Conjunctivae normal.      Pupils: Pupils are equal, round, and reactive to light.   Neck:      Musculoskeletal: Normal range of motion and  neck supple.   Cardiovascular:      Rate and Rhythm: Normal rate and regular rhythm.      Pulses: Normal pulses.      Heart sounds: Normal heart sounds.   Pulmonary:      Effort: Pulmonary effort is normal. No respiratory distress.      Breath sounds: Normal breath sounds.   Abdominal:      General: Bowel sounds are normal.      Palpations: Abdomen is soft.   Musculoskeletal: Normal range of motion.      Comments: Bilateral hand weakness worse in the right. Bilateral 5 finger with trigger finger.  Bilateral 4th finger with slight enlargement of DIPs   Skin:     General: Skin is warm.      Capillary Refill: Capillary refill takes less than 2 seconds.   Neurological:      General: No focal deficit present.      Mental Status: She is alert and oriented to person, place, and time.      Motor: Weakness (minimal weakness in right hand compared to left.) present.   Psychiatric:         Attention and Perception: Attention normal.         Mood and Affect: Mood normal.         Speech: Speech normal.         Behavior: Behavior normal.         Thought Content: Thought content normal.         Cognition and Memory: Cognition normal.         Judgment: Judgment normal.             Assessment:       1. Pure hypercholesterolemia    2. Acquired hypothyroidism    3. Raynaud's disease without gangrene    4. Non-small cell lung cancer, right    5. Postmenopause    6. History of postmenopausal HRT    7. History of chemotherapy    8. Bilateral hand pain          Plan:       1.  Check lipid panel and CMP in 3 months.  Continue lifestyle modification  2.  Continue Synthroid 75 mcg daily  3.  Continue nifedipine 30 mg daily  4.  Follow-up as scheduled with oncology  5.  Continue treatment per GYN    6.  Check DEXA scan  7.  Check DEXA scan  8.  Check rheumatoid factor and bilateral hand x-rays.  I will contact patient with these results and further recommendations.  Follow up in 3 months for chronic medical problems with labs prior  Orders  Placed This Encounter   . Dxa Bone Density Axial Skeleton   . XR  Hand Right PA And Lateral   . XR Hand Left PA And Lateral   . Rheumatoid factor   . Lipid panel   . Comprehensive metabolic panel   . cycloSPORINE (RESTASIS) 0.05 % ophthalmic emulsion   . NIFEdipine ER (ADALAT CC) 30 MG 24 hr tablet

## 2018-12-03 ENCOUNTER — Telehealth (INDEPENDENT_AMBULATORY_CARE_PROVIDER_SITE_OTHER): Payer: Self-pay

## 2018-12-03 ENCOUNTER — Inpatient Hospital Stay (HOSPITAL_BASED_OUTPATIENT_CLINIC_OR_DEPARTMENT_OTHER): Admit: 2018-12-03 | Discharge: 2018-12-03 | Disposition: A | Discharge status: Home / Self Care | Payer: Self-pay

## 2018-12-03 ENCOUNTER — Other Ambulatory Visit (HOSPITAL_BASED_OUTPATIENT_CLINIC_OR_DEPARTMENT_OTHER): Payer: Self-pay

## 2018-12-03 NOTE — Telephone Encounter (Signed)
Gave Xray results per [name]. Melody Hendricks

## 2018-12-06 ENCOUNTER — Telehealth (INDEPENDENT_AMBULATORY_CARE_PROVIDER_SITE_OTHER): Payer: Self-pay

## 2018-12-06 ENCOUNTER — Encounter (INDEPENDENT_AMBULATORY_CARE_PROVIDER_SITE_OTHER): Payer: Self-pay | Admitting: Family

## 2018-12-06 DIAGNOSIS — M05741 Rheumatoid arthritis with rheumatoid factor of right hand without organ or systems involvement: Secondary | ICD-10-CM

## 2018-12-06 DIAGNOSIS — M79642 Pain in left hand: Secondary | ICD-10-CM

## 2018-12-06 NOTE — Telephone Encounter (Signed)
Error

## 2018-12-08 ENCOUNTER — Telehealth: Payer: Self-pay | Admitting: Hematology & Oncology

## 2018-12-08 ENCOUNTER — Other Ambulatory Visit: Payer: Self-pay | Admitting: Hematology & Oncology

## 2018-12-08 NOTE — Telephone Encounter (Signed)
Pt called to ask for results of PET scan. Called back and left VM asking where it was done. There are no results in her chart.    Servando Snare  Triage Nurse

## 2018-12-16 ENCOUNTER — Telehealth: Payer: Self-pay | Admitting: Hematology & Oncology

## 2018-12-16 NOTE — Telephone Encounter (Signed)
Called and left VM with callback number re CT results.    Servando Snare  Triage Nurse

## 2019-01-12 ENCOUNTER — Encounter (HOSPITAL_BASED_OUTPATIENT_CLINIC_OR_DEPARTMENT_OTHER): Payer: Self-pay | Admitting: Hematology & Oncology

## 2019-01-12 ENCOUNTER — Other Ambulatory Visit: Payer: Self-pay

## 2019-01-12 ENCOUNTER — Ambulatory Visit (HOSPITAL_BASED_OUTPATIENT_CLINIC_OR_DEPARTMENT_OTHER): Payer: 59 | Attending: Hematology & Oncology | Admitting: Hematology & Oncology

## 2019-01-12 VITALS — BP 137/63 | HR 102 | Temp 97.5°F | Resp 22 | Ht 60.98 in | Wt 121.4 lb

## 2019-01-12 DIAGNOSIS — C3491 Malignant neoplasm of unspecified part of right bronchus or lung: Secondary | ICD-10-CM | POA: Insufficient documentation

## 2019-01-12 DIAGNOSIS — Z79899 Other long term (current) drug therapy: Secondary | ICD-10-CM | POA: Insufficient documentation

## 2019-01-12 DIAGNOSIS — S22050A Wedge compression fracture of T5-T6 vertebra, initial encounter for closed fracture: Secondary | ICD-10-CM

## 2019-01-12 DIAGNOSIS — Z85118 Personal history of other malignant neoplasm of bronchus and lung: Secondary | ICD-10-CM

## 2019-01-12 DIAGNOSIS — I7300 Raynaud's syndrome without gangrene: Secondary | ICD-10-CM | POA: Insufficient documentation

## 2019-01-12 DIAGNOSIS — Z129 Encounter for screening for malignant neoplasm, site unspecified: Secondary | ICD-10-CM

## 2019-01-12 DIAGNOSIS — E039 Hypothyroidism, unspecified: Secondary | ICD-10-CM | POA: Insufficient documentation

## 2019-01-12 NOTE — Progress Notes (Signed)
[place], [place]  [place] [address]  Operated by [facility]  Return Patient Progress Note    Date: 01/12/2019    Name: Gina Barrett  MRN: [id number]  Referring Physician: Self, Referral  Primary Care Provider: [facility]ign    Reason for visit/consultation Lung Cancer (f/u)    Hem/Onc Diagnosis: Stage IIIB non-small cell mod differentiated adenocarcinoma of the lung diagnosed at [facility] on 10/02/2016  -underwent CT lung cancer screening on 05/10/2016 which showed 8 mm ill-defined nodule in the right upper lobe, a 5 mm nodule at the base of the right lower lobe with subpleural scarring in both lungs.  -PET-CT scan on 09/06/2016 showed 10 mm right upper lobe FDG avid lesion that is slightly larger than 2017 CT and concerning for malignancy.  A separate 3 cm long area of tracer uptake in the left lower lobe was noted.  -CT-guided biopsy a [facility] on 10/02/2016 was positive for adenocarcinoma that is well to moderately differentiated.  -Mediastinoscopy 11/13/2016 positive for left mid paratrachael      Treatment & Surveillance:   1. S/p chemoradiation with carboplatin + paclitaxel + RT completed 02/10/19/18    2. Serial imaging since completion of therapy has been negative for recurrence.  Most recent imaging in on 12/03/2018 was also negative but showed a new T6 compression abnormality.  Of note, patient is postmenopausal and has history hypothyroidism.    Interval History:  Gina Barrett is a 63 y.o. female with stage IIIB non-small cell lung cancer who is s/p concurrent chemoradiation completed end of August 2019. She returns to clinic today for f/u and has no new respiratory, CV or bone pain symptoms.  [facility] [place] [employer] on 12/03/2018 but forgot to bring in CD    ROS:   Negative except for Interval history above.        Past Medical History  Past Medical History:   Diagnosis Date   . Abnormal  Pap smear     ASCUS, ASCUS cannot rule out HGSIL   . Cancer (CMS HCC)     non small cell lung cancer (R)-s/p Chemo/Radiation completed 8/18   . Colon polyp    . Dysphagia    . Hypothyroid    . Non-small cell lung cancer, right (CMS [place])     s/p Chemo completed 9/18   . Raynaud's disease    . STD (sexually transmitted disease)     HSV   . Unspecified disorder of thyroid     Hypothyroidism       Current Outpatient Medications   Medication Sig   . cycloSPORINE (RESTASIS) 0.05 % Ophthalmic Dropperette Instill 1 Drop into both eyes Every 12 hours   . levothyroxine (SYNTHROID) 75 mcg Oral Tablet Take 75 mcg by mouth Every morning   . loratadine (CLARITIN) 10 mg Oral Tablet take 10 mg by mouth Once a day.   . Nifedipine (ADALAT CC) 30 mg Oral Tablet Sustained Release take 30 mg by mouth Once a day.   . OMEGA-3 FATTY ACIDS (FISH OIL ORAL) take  by mouth Once a day.   Marland Kitchen PREMPRO 0.3-1.5 mg Oral Tablet TAKE ONE TABLET BY MOUTH EVERY DAY        Physical Examination:  BP 137/63   Pulse (!) 102   Temp 36.4 C (97.5 F) (Thermal Scan)   Resp (!) 22   Ht 1.549 m (5' 0.98") Comment: *  Wt 55.1 kg (121 lb 6.4 oz)   SpO2 96%   BMI 22.95 kg/m      ECOG Status: 1 - Restricted in physically strenuous activity, but ambulatory and able to carry out work of a light or sedentary nature, e.g., light housework, office work.   General:Appears well, in NAD   HENT: No oral lesions.    Lungs: clear to auscultation bilaterally.   Cardiovascular: RRR without murmur.  Abdomen: soft, non-tender, non-distended, no hepatosplenomegaly or palpable masses  Extremities: No edema bilaterally  Skin: No clinically significant rashes    Labs:  No results found for this or any previous visit (from the past 36 hour(s)).    Radiology:  No evidence of recurrence/relapse disease.  New T6 compression abnormality    Assessment:   1. Non-small cell lung cancer, right (CMS [facility])  63 year old woman with stage III non-small cell lung cancer diagnosed and treated as  above who remains in clinical and radiographic remission 2 years after completing therapy.  She does have a new T6 compression abnormality on most recent imaging in June 2020 that is not pathologic in nature.  Will continue surveillance with repeat imaging study in 6-12 months     2. Closed wedge compression fracture of T6 vertebra (CMS HCC)  Patient is completely asymptomatic.  I recommended getting bone density to evaluate for osteoporosis and to treated accordingly.  She is already in the process of doing so with her primary care physician.  There is no indication for bone scan at this time.  I recommended starting vitamin D.     3. Cancer screening  Her colonoscopy, mammogram and Pap smear are up-to-date.    Return in about 6 months (around 07/15/2019).    CC:  PCP General:  [place] [id number]    Scribe Attestation:  Arloa Koh, Scribe, scribed for Dr.Stevenson Windmiller,MD on 09:58 01/12/2019     Physician Attestation:  Documentation assistance provided for Dr.Gauge Winski,MD by Leia Alf, SCRIBE. information recorded by the scribe was done at my direction and has been reviewed and validated by me Dr.Kenai Fluegel,MD.

## 2019-01-13 ENCOUNTER — Other Ambulatory Visit (HOSPITAL_BASED_OUTPATIENT_CLINIC_OR_DEPARTMENT_OTHER): Payer: Self-pay

## 2019-01-18 ENCOUNTER — Telehealth (INDEPENDENT_AMBULATORY_CARE_PROVIDER_SITE_OTHER): Payer: Self-pay

## 2019-01-18 ENCOUNTER — Other Ambulatory Visit (HOSPITAL_BASED_OUTPATIENT_CLINIC_OR_DEPARTMENT_OTHER): Payer: Self-pay | Admitting: NURSE PRACTITIONER

## 2019-01-18 DIAGNOSIS — Z9229 Personal history of other drug therapy: Secondary | ICD-10-CM

## 2019-01-18 DIAGNOSIS — Z780 Asymptomatic menopausal state: Secondary | ICD-10-CM

## 2019-01-18 DIAGNOSIS — Z9221 Personal history of antineoplastic chemotherapy: Secondary | ICD-10-CM

## 2019-01-18 NOTE — Telephone Encounter (Signed)
Spoke with patient about Bone Density Scan scheduled for 01/20/2019 @ 2:30pm at the [employer] building.

## 2019-01-20 ENCOUNTER — Ambulatory Visit
Admission: RE | Admit: 2019-01-20 | Discharge: 2019-01-20 | Disposition: A | Discharge status: Home / Self Care | Payer: 59 | Source: Ambulatory Visit | Attending: NURSE PRACTITIONER | Admitting: NURSE PRACTITIONER

## 2019-01-20 ENCOUNTER — Other Ambulatory Visit: Payer: Self-pay

## 2019-01-20 DIAGNOSIS — Z780 Asymptomatic menopausal state: Secondary | ICD-10-CM | POA: Insufficient documentation

## 2019-01-20 DIAGNOSIS — M810 Age-related osteoporosis without current pathological fracture: Secondary | ICD-10-CM | POA: Insufficient documentation

## 2019-01-20 DIAGNOSIS — Z9229 Personal history of other drug therapy: Secondary | ICD-10-CM | POA: Insufficient documentation

## 2019-01-20 DIAGNOSIS — Z9221 Personal history of antineoplastic chemotherapy: Secondary | ICD-10-CM

## 2019-01-25 ENCOUNTER — Other Ambulatory Visit (HOSPITAL_BASED_OUTPATIENT_CLINIC_OR_DEPARTMENT_OTHER): Payer: Self-pay

## 2019-01-27 ENCOUNTER — Encounter (INDEPENDENT_AMBULATORY_CARE_PROVIDER_SITE_OTHER): Payer: Self-pay | Admitting: Family

## 2019-01-27 ENCOUNTER — Ambulatory Visit (INDEPENDENT_AMBULATORY_CARE_PROVIDER_SITE_OTHER): Payer: Commercial Managed Care - POS | Admitting: Family

## 2019-01-27 DIAGNOSIS — M85851 Other specified disorders of bone density and structure, right thigh: Secondary | ICD-10-CM

## 2019-01-27 DIAGNOSIS — M85852 Other specified disorders of bone density and structure, left thigh: Secondary | ICD-10-CM

## 2019-01-27 DIAGNOSIS — M25511 Pain in right shoulder: Secondary | ICD-10-CM

## 2019-01-27 DIAGNOSIS — M05742 Rheumatoid arthritis with rheumatoid factor of left hand without organ or systems involvement: Secondary | ICD-10-CM

## 2019-01-27 DIAGNOSIS — M05741 Rheumatoid arthritis with rheumatoid factor of right hand without organ or systems involvement: Secondary | ICD-10-CM

## 2019-01-27 DIAGNOSIS — S22000A Wedge compression fracture of unspecified thoracic vertebra, initial encounter for closed fracture: Secondary | ICD-10-CM

## 2019-01-27 DIAGNOSIS — M4854XD Collapsed vertebra, not elsewhere classified, thoracic region, subsequent encounter for fracture with routine healing: Secondary | ICD-10-CM

## 2019-01-27 DIAGNOSIS — M79644 Pain in right finger(s): Secondary | ICD-10-CM

## 2019-01-27 MED ORDER — RALOXIFENE HCL 60 MG PO TABS
60.0000 mg | ORAL_TABLET | Freq: Every day | ORAL | 3 refills | Status: DC
Start: ? — End: 2019-01-27

## 2019-01-27 MED ORDER — PREDNISONE 20 MG PO TABS
ORAL_TABLET | ORAL | 0 refills | Status: DC
Start: ? — End: 2019-01-27

## 2019-01-27 NOTE — Progress Notes (Signed)
Subjective:    Patient ID: Melody Hendricks is a 63 y.o. female.    Here today for evaluation for right shoulder and right thumb pain without injury.  She was recently diagnosed with RA and was referred to Rheumatology, but is still waiting on an appointment.  She was also recently seen by Dr. Lorrin Jackson to follow up on breast cancer. She has an CT of her chest that revealed a new compression abnormality @ T6. This test was completed @ [facility] on 12/03/2018. I reviewed the findings and I agree with the interpretation.  Patient denies any pain over area of concern.  She has had a recent DEXA scan that revealed that FRAX* Results:  10-Year Probability of Fracture   Major Osteoporotic Fracture  23.7% Hip Fracture  9.4%  She is not currently on any treatment @ this time      Shoulder Pain    This is a new problem. The current episode started 1 to 4 weeks ago. The problem occurs 2 to 4 times per day. The problem has been waxing and waning. The quality of the pain is described as aching. The pain is at a severity of 7/10. The pain is moderate. Pertinent negatives include no fever, inability to bear weight, itching, joint swelling, limited range of motion, numbness or tingling. The symptoms are aggravated by activity. Treatments tried: Motrin. The treatment provided mild relief. Family history includes rheumatoid arthritis.   Hand Pain    The incident occurred 5 to 7 days ago. The incident occurred at home. There was no injury mechanism. Pain location: right thumb. The quality of the pain is described as aching. The pain does not radiate. The pain is at a severity of 4/10. The pain is mild. The pain has been fluctuating since the incident. Pertinent negatives include no chest pain, muscle weakness, numbness or tingling. Exacerbated by: pushing with thumb. Treatments tried: motrin.       The following portions of the patient's history were reviewed and updated as appropriate: allergies, current medications,  past family history, past medical history, past social history, past surgical history and problem list.    Review of Systems   Constitutional: Negative for activity change, appetite change, chills, fatigue and fever.   HENT: Negative for sinus pressure and sinus pain.    Eyes: Negative for visual disturbance.   Respiratory: Negative for chest tightness and shortness of breath.    Cardiovascular: Negative for chest pain.   Gastrointestinal: Negative for abdominal pain, constipation, diarrhea, nausea and vomiting.   Endocrine: Negative for polydipsia and polyphagia.   Genitourinary: Negative for difficulty urinating.   Musculoskeletal: Negative for arthralgias.        Right shoulder and thumb pain without injury   Skin: Negative for itching and rash.   Neurological: Negative for tingling, numbness and headaches.   Psychiatric/Behavioral: Negative for suicidal ideas.   All other systems reviewed and are negative.          Objective:    Physical Exam  Vitals signs reviewed.   Constitutional:       Appearance: Normal appearance.   HENT:      Head: Normocephalic and atraumatic.      Right Ear: Tympanic membrane, ear canal and external ear normal.      Left Ear: Tympanic membrane, ear canal and external ear normal.      Nose: Nose normal.      Mouth/Throat:      Mouth: Mucous membranes are moist.  Pharynx: Oropharynx is clear.   Eyes:      Extraocular Movements: Extraocular movements intact.      Conjunctiva/sclera: Conjunctivae normal.      Pupils: Pupils are equal, round, and reactive to light.   Neck:      Musculoskeletal: Normal range of motion and neck supple.   Cardiovascular:      Rate and Rhythm: Normal rate and regular rhythm.      Pulses: Normal pulses.      Heart sounds: Normal heart sounds.   Pulmonary:      Effort: Pulmonary effort is normal. No respiratory distress.      Breath sounds: Normal breath sounds.   Abdominal:      General: Bowel sounds are normal.      Palpations: Abdomen is soft.    Musculoskeletal:         General: Swelling (right thumb) and tenderness (over anterior shoulder girdle and PIP of right thumb) present.      Right shoulder: She exhibits tenderness.        Arms:    Skin:     General: Skin is warm.      Capillary Refill: Capillary refill takes less than 2 seconds.   Neurological:      General: No focal deficit present.      Mental Status: She is alert and oriented to person, place, and time.   Psychiatric:         Attention and Perception: Attention normal.         Mood and Affect: Mood normal.         Speech: Speech normal.         Behavior: Behavior normal.         Thought Content: Thought content normal.         Cognition and Memory: Cognition normal.         Judgment: Judgment normal.             Assessment:       1. Acute pain of right shoulder    2. Thumb pain, right    3. Rheumatoid arthritis involving both hands with positive rheumatoid factor    4. Compression fracture of body of thoracic vertebra    5. Osteopenia of both hips          Plan:       1/2.  Discussed with patient that I believe that her shoulder and thumb pain is related to her RA flare.  Will start prednisone taper  3.  Check with referral specialist to reassure referral was sent.  It was confirmed that patient's referral was sent and appointment is still pending.  Patient is made aware  4.  No treatment needed at this time.  Patient denies pain over her T6 area  5.  Patient is instructed to start E Vista 60 mg after she finishes her round of prednisone.  Patient is to follow-up as already scheduled in 2 months for a chronic medical issue recheck  Orders Placed This Encounter   . predniSONE (DELTASONE) 20 MG tablet   . raloxifene (Evista) 60 MG tablet

## 2019-01-31 ENCOUNTER — Encounter (INDEPENDENT_AMBULATORY_CARE_PROVIDER_SITE_OTHER): Payer: Self-pay

## 2019-01-31 NOTE — Progress Notes (Signed)
scan

## 2019-02-02 ENCOUNTER — Other Ambulatory Visit (INDEPENDENT_AMBULATORY_CARE_PROVIDER_SITE_OTHER): Payer: Self-pay | Admitting: Obstetrics & Gynecology

## 2019-02-02 DIAGNOSIS — Z79890 Hormone replacement therapy (postmenopausal): Secondary | ICD-10-CM

## 2019-02-02 NOTE — Telephone Encounter (Signed)
Pt has an appt on 05/08/19 with Dr. Illene Bolus. Rae Halsted, MA  02/02/2019, 09:22

## 2019-02-07 ENCOUNTER — Other Ambulatory Visit (INDEPENDENT_AMBULATORY_CARE_PROVIDER_SITE_OTHER): Payer: Self-pay | Admitting: Family

## 2019-02-07 DIAGNOSIS — I7300 Raynaud's syndrome without gangrene: Secondary | ICD-10-CM

## 2019-02-07 DIAGNOSIS — M05741 Rheumatoid arthritis with rheumatoid factor of right hand without organ or systems involvement: Secondary | ICD-10-CM

## 2019-02-07 DIAGNOSIS — M3500 Sicca syndrome, unspecified: Secondary | ICD-10-CM

## 2019-03-22 ENCOUNTER — Other Ambulatory Visit: Payer: Self-pay

## 2019-03-22 ENCOUNTER — Ambulatory Visit: Payer: 59 | Attending: NURSE PRACTITIONER

## 2019-03-22 DIAGNOSIS — E7800 Pure hypercholesterolemia, unspecified: Secondary | ICD-10-CM | POA: Insufficient documentation

## 2019-03-22 LAB — COMPREHENSIVE METABOLIC PROFILE - BMC/JMC ONLY
ALBUMIN/GLOBULIN RATIO: 1 (ref 0.8–2.0)
ALBUMIN: 3.4 g/dL — ABNORMAL LOW (ref 3.5–5.0)
ALKALINE PHOSPHATASE: 49 U/L (ref 38–126)
ALT (SGPT): 11 U/L — ABNORMAL LOW (ref 14–54)
ANION GAP: 10 mmol/L (ref 3–11)
AST (SGOT): 18 U/L (ref 15–41)
BILIRUBIN TOTAL: 0.5 mg/dL (ref 0.3–1.2)
BUN/CREA RATIO: 15 (ref 6–22)
BUN: 10 mg/dL (ref 6–20)
CALCIUM: 9.4 mg/dL (ref 8.8–10.2)
CHLORIDE: 106 mmol/L (ref 101–111)
CO2 TOTAL: 23 mmol/L (ref 22–32)
CREATININE: 0.67 mg/dL (ref 0.44–1.00)
ESTIMATED GFR: >60 mL/min/{1.73_m2} (ref >60)
GLUCOSE: 85 mg/dL (ref 70–110)
POTASSIUM: 4.7 mmol/L (ref 3.4–5.1)
PROTEIN TOTAL: 6.9 g/dL (ref 6.4–8.3)
SODIUM: 139 mmol/L (ref 136–145)

## 2019-03-22 LAB — LIPID PANEL
CHOL/HDL RATIO: 4.9
CHOLESTEROL: 205 mg/dL — ABNORMAL HIGH (ref 120–199)
HDL CHOL: 42 mg/dL (ref >39)
LDL CALC: 149 mg/dL — ABNORMAL HIGH (ref <=130)
TRIGLYCERIDES: 68 mg/dL (ref <150)
VLDL CALC: 14 mg/dL (ref 5–35)

## 2019-03-28 ENCOUNTER — Encounter (INDEPENDENT_AMBULATORY_CARE_PROVIDER_SITE_OTHER): Payer: Self-pay | Admitting: Family

## 2019-03-28 ENCOUNTER — Ambulatory Visit (INDEPENDENT_AMBULATORY_CARE_PROVIDER_SITE_OTHER): Payer: Commercial Managed Care - POS | Admitting: Family

## 2019-03-28 VITALS — BP 118/70 | HR 79 | Temp 98.0°F | Resp 18 | Ht 61.0 in | Wt 122.0 lb

## 2019-03-28 DIAGNOSIS — M05741 Rheumatoid arthritis with rheumatoid factor of right hand without organ or systems involvement: Secondary | ICD-10-CM

## 2019-03-28 DIAGNOSIS — M05742 Rheumatoid arthritis with rheumatoid factor of left hand without organ or systems involvement: Secondary | ICD-10-CM

## 2019-03-28 DIAGNOSIS — E782 Mixed hyperlipidemia: Secondary | ICD-10-CM

## 2019-03-28 DIAGNOSIS — M3500 Sicca syndrome, unspecified: Secondary | ICD-10-CM

## 2019-03-28 DIAGNOSIS — E039 Hypothyroidism, unspecified: Secondary | ICD-10-CM

## 2019-03-28 DIAGNOSIS — C3491 Malignant neoplasm of unspecified part of right bronchus or lung: Secondary | ICD-10-CM

## 2019-03-28 DIAGNOSIS — Z23 Encounter for immunization: Secondary | ICD-10-CM

## 2019-03-28 DIAGNOSIS — I7300 Raynaud's syndrome without gangrene: Secondary | ICD-10-CM

## 2019-03-28 NOTE — Progress Notes (Signed)
Patient gave verbal consent to receive vaccination. Patient was given an IM injections. Patient tolerated well. Patient was given patient education on any side effects.  Paola Aleshire, MA

## 2019-03-28 NOTE — Progress Notes (Signed)
Subjective:    Patient ID: Melody Hendricks is a 63 y.o. female.    Here to follow up on   Moderate mixed hyperlipidemia not requiring statin therapy   Acquired hypothyroidism  Raynaud's disease without gangrene  Sjogren's syndrome without extraglandular involvement  Rheumatoid arthritis involving both hands with positive rheumatoid factor  Non-small cell lung cancer, right    Patient  Has not had her cholesterol checked since December 2019, when it was @ goal. Patient forgot to have fasting labs prior to this appointment.  She is taking synthroid 75 MCG daily. Her last TSH was in June 2020. The result was 2.7. She denies any hypothyroid symptoms.    Taking adalat 30 mg daily for raynaud's syndrome.  She denies any ulcerations on her fingers or toes.  Patient admits to wearing warm socks and gloves when needed.    Patient has an appointment to see rheumatology on 04/07/2019   for treatment of her Sjogren's as well as rheumatoid arthritis.    Patient's next appointment with oncology to follow-up on her right sided lung cancer is  06/2019 .  She is currently in remission.  She denies any issues with shortness of breath, cough, or fever.  Her shoulder pain in right shoulder is remarkably better. She states that it doesn't bother at much at all since she took the Prednisone taper.                The following portions of the patient's history were reviewed and updated as appropriate: allergies, current medications, past family history, past medical history, past social history, past surgical history and problem list.    Review of Systems   Constitutional: Negative for activity change, appetite change, chills, fatigue and fever.   HENT: Negative for sinus pressure and sinus pain.    Eyes: Negative for visual disturbance.   Respiratory: Negative for chest tightness and shortness of breath.    Cardiovascular: Negative for chest pain.   Gastrointestinal: Negative for abdominal pain, constipation, diarrhea, nausea and  vomiting.   Endocrine: Negative for polydipsia and polyphagia.   Genitourinary: Negative for difficulty urinating.   Musculoskeletal: Positive for arthralgias and joint swelling.   Skin: Negative for rash.   Neurological: Negative for headaches.   Psychiatric/Behavioral: Negative for suicidal ideas.   All other systems reviewed and are negative.          Objective:    Physical Exam  Vitals signs reviewed.   Constitutional:       Appearance: Normal appearance.   HENT:      Head: Normocephalic and atraumatic.      Right Ear: Tympanic membrane, ear canal and external ear normal.      Left Ear: Tympanic membrane, ear canal and external ear normal.      Nose: Nose normal.      Mouth/Throat:      Mouth: Mucous membranes are moist.      Pharynx: Oropharynx is clear.   Eyes:      Extraocular Movements: Extraocular movements intact.      Conjunctiva/sclera: Conjunctivae normal.      Pupils: Pupils are equal, round, and reactive to light.   Neck:      Musculoskeletal: Normal range of motion and neck supple.   Cardiovascular:      Rate and Rhythm: Normal rate and regular rhythm.      Pulses: Normal pulses.      Heart sounds: Normal heart sounds.   Pulmonary:  Effort: Pulmonary effort is normal. No respiratory distress.      Breath sounds: Normal breath sounds.   Abdominal:      General: Bowel sounds are normal.      Palpations: Abdomen is soft.   Musculoskeletal: Normal range of motion.      Comments: Joint tenderness in both hands    Skin:     General: Skin is warm.      Capillary Refill: Capillary refill takes less than 2 seconds.   Neurological:      General: No focal deficit present.      Mental Status: She is alert and oriented to person, place, and time.   Psychiatric:         Attention and Perception: Attention normal.         Mood and Affect: Mood normal.         Speech: Speech normal.         Behavior: Behavior normal.         Thought Content: Thought content normal.         Cognition and Memory: Cognition normal.          Judgment: Judgment normal.             Assessment:       1. Moderate mixed hyperlipidemia not requiring statin therapy    2. Acquired hypothyroidism    3. Raynaud's disease without gangrene    4. Sjogren's syndrome without extraglandular involvement    5. Rheumatoid arthritis involving both hands with positive rheumatoid factor    6. Non-small cell lung cancer, right    7. Immunization due          Plan:       1. Continue to exercise. Get plenty of fiber in diet  2. Continue Synthroid  3. Follow up with Rheumatology as scheduled  4. Follow up with Endocrinology  5. Follow up with Rheumatology as scheduled  6. Follow up with Oncology as scheduled  7. Flu shot given    Orders Placed This Encounter   . Flu Vaccine Recombinant Quadrivalent 18 yrs & up PRESERVATIVE FREE (Flublok)   . TSH   . prednisoLONE acetate (PRED FORTE) 1 % ophthalmic suspension

## 2019-04-05 ENCOUNTER — Encounter (HOSPITAL_COMMUNITY): Payer: Self-pay

## 2019-04-05 ENCOUNTER — Other Ambulatory Visit: Payer: Self-pay

## 2019-04-05 ENCOUNTER — Emergency Department
Admission: EM | Admit: 2019-04-05 | Discharge: 2019-04-06 | Disposition: A | Discharge status: Home / Self Care | Payer: 59 | Attending: Emergency Medicine | Admitting: Emergency Medicine

## 2019-04-05 ENCOUNTER — Emergency Department (HOSPITAL_COMMUNITY): Payer: 59

## 2019-04-05 DIAGNOSIS — J189 Pneumonia, unspecified organism: Secondary | ICD-10-CM

## 2019-04-05 DIAGNOSIS — Z87891 Personal history of nicotine dependence: Secondary | ICD-10-CM | POA: Insufficient documentation

## 2019-04-05 DIAGNOSIS — R55 Syncope and collapse: Secondary | ICD-10-CM | POA: Insufficient documentation

## 2019-04-05 DIAGNOSIS — N390 Urinary tract infection, site not specified: Secondary | ICD-10-CM

## 2019-04-05 LAB — URINALYSIS WITH MICROSCOPIC REFLEX IF INDICATED BMC/JMC ONLY
BILIRUBIN: NEGATIVE mg/dL
BLOOD: NEGATIVE mg/dL
GLUCOSE: NEGATIVE mg/dL
KETONES: NEGATIVE mg/dL
NITRITE: NEGATIVE
PH: 6.5 (ref <8.0)
PROTEIN: NEGATIVE mg/dL
SPECIFIC GRAVITY: <=1.005 (ref <1.022)
UROBILINOGEN: 0.2 mg/dL (ref <=2.0)

## 2019-04-05 LAB — COMPREHENSIVE METABOLIC PROFILE - BMC/JMC ONLY
ALBUMIN/GLOBULIN RATIO: 0.9 (ref 0.8–2.0)
ALBUMIN: 3.4 g/dL — ABNORMAL LOW (ref 3.5–5.0)
ALKALINE PHOSPHATASE: 55 U/L (ref 38–126)
ALT (SGPT): 13 U/L — ABNORMAL LOW (ref 14–54)
ANION GAP: 10 mmol/L (ref 3–11)
AST (SGOT): 20 U/L (ref 15–41)
BILIRUBIN TOTAL: 0.3 mg/dL (ref 0.3–1.2)
BUN/CREA RATIO: 10 (ref 6–22)
BUN: 8 mg/dL (ref 6–20)
CALCIUM: 9.1 mg/dL (ref 8.8–10.2)
CHLORIDE: 103 mmol/L (ref 101–111)
CO2 TOTAL: 24 mmol/L (ref 22–32)
CREATININE: 0.84 mg/dL (ref 0.44–1.00)
ESTIMATED GFR: >60 mL/min/{1.73_m2} (ref >60)
GLUCOSE: 123 mg/dL — ABNORMAL HIGH (ref 70–110)
POTASSIUM: 4.7 mmol/L (ref 3.4–5.1)
PROTEIN TOTAL: 7.3 g/dL (ref 6.4–8.3)
SODIUM: 137 mmol/L (ref 136–145)

## 2019-04-05 LAB — CBC WITH DIFF
BASOPHIL #: 0 10*3/uL (ref 0.00–0.10)
BASOPHIL %: 1 % (ref 0–3)
EOSINOPHIL #: 0.1 10*3/uL (ref 0.00–0.50)
EOSINOPHIL %: 2 % (ref 0–5)
HCT: 37 % (ref 36.0–45.0)
HGB: 12.6 g/dL (ref 12.0–15.5)
LYMPHOCYTE #: 1 10*3/uL (ref 1.00–4.80)
LYMPHOCYTE %: 14 % — ABNORMAL LOW (ref 15–43)
MCH: 30.4 pg (ref 27.5–33.2)
MCHC: 34.1 g/dL (ref 32.0–36.0)
MCV: 89.2 fL (ref 82.0–97.0)
MONOCYTE #: 0.6 10*3/uL (ref 0.20–0.90)
MONOCYTE %: 8 % (ref 5–12)
MPV: 8 fL (ref 7.4–10.5)
NEUTROPHIL #: 5.8 10*3/uL (ref 1.50–6.50)
NEUTROPHIL %: 76 % (ref 43–76)
PLATELETS: 279 10*3/uL (ref 150–450)
RBC: 4.15 10*6/uL (ref 4.00–5.10)
RDW: 13.5 % (ref 11.0–16.0)
WBC: 7.6 10*3/uL (ref 4.0–11.0)

## 2019-04-05 LAB — LACTIC ACID LEVEL W/ REFLEX FOR LEVEL >2.0: LACTIC ACID: 1.2 mmol/L (ref 0.5–2.0)

## 2019-04-05 LAB — URINALYSIS, MICROSCOPIC

## 2019-04-05 LAB — BLUE TOP TUBE

## 2019-04-05 LAB — TROPONIN-I: TROPONIN I: <0.03 ng/mL (ref <=0.06)

## 2019-04-05 LAB — THYROID STIMULATING HORMONE WITH FREE T4 REFLEX: TSH: 2.438 u[IU]/mL (ref 0.340–5.330)

## 2019-04-05 MED ORDER — DOXYCYCLINE HYCLATE 100 MG TABLET
100.00 mg | ORAL_TABLET | ORAL | Status: AC
Start: 2019-04-06 — End: 2019-04-06
  Administered 2019-04-06: 100 mg via ORAL
  Filled 2019-04-05: qty 1

## 2019-04-05 MED ORDER — DOXYCYCLINE HYCLATE 100 MG CAPSULE
100.00 mg | ORAL_CAPSULE | Freq: Two times a day (BID) | ORAL | 0 refills | Status: AC
Start: 2019-04-05 — End: 2019-04-15

## 2019-04-05 MED ORDER — IOPAMIDOL 370 MG IODINE/ML (76 %) INTRAVENOUS SOLUTION
100.0000 mL | INTRAVENOUS | Status: AC
Start: 2019-04-05 — End: 2019-04-05
  Administered 2019-04-05: 23:00:00 100 mL via INTRAVENOUS
  Filled 2019-04-05: qty 100

## 2019-04-05 MED ORDER — SODIUM CHLORIDE 0.9 % IV BOLUS
1000.00 mL | INJECTION | Status: AC
Start: 2019-04-05 — End: 2019-04-05
  Administered 2019-04-05: 1000 mL via INTRAVENOUS
  Administered 2019-04-05: 0 mL via INTRAVENOUS

## 2019-04-05 NOTE — ED Nurses Note (Signed)
Dr.  Londner at bedside.

## 2019-04-05 NOTE — ED Provider Notes (Signed)
Gina Chamber, MD  [facility]  Emergency Department Visit Note    Date:  04/05/2019  Primary care provider:  Lanier Ensign  Means of arrival:  private car  History obtained from: patient  History limited by: none    Chief Complaint:  Syncope     HISTORY OF PRESENT ILLNESS     Gina Barrett, date of birth [birth year], is a 63 y.o. female who presents to the Emergency Department complaining of syncope onset earlier today. Patient has a witnessed syncope episode while standing up having a conversation. Patient was lowered to the ground by her daughter. Daughter checked her sugar at that time which was 60. Daughter reports the patient having a decreased appetite. Patient denies any new medications or nutritional changes. She notes that her blood pressure usually runs low. The patient denies measured fevers, nausea, vomiting, diarrhea, shortness of breath, abdominal pain, chest pain or extremity pain.     REVIEW OF SYSTEMS     The pertinent positive and negative symptoms are as per HPI. All other systems reviewed and are negative.     PATIENT HISTORY     Past Medical History:  Past Medical History:   Diagnosis Date   . Abnormal Pap smear     ASCUS, ASCUS cannot rule out HGSIL   . Cancer (CMS HCC)     non small cell lung cancer (R)-s/p Chemo/Radiation completed 8/18   . Colon polyp    . Dysphagia    . Hypothyroid    . Non-small cell lung cancer, right (CMS [place])     s/p Chemo completed 9/18   . Raynaud's disease    . STD (sexually transmitted disease)     HSV   . Unspecified disorder of thyroid     Hypothyroidism       Past Surgical History:  Past Surgical History:   Procedure Laterality Date   . Hx cataract removal Bilateral 2011   . Hx colonoscopy  2009   . Hx elbow surgery     . Hx foot surgery     . Hx tubal ligation  1978   . Lung biopsy Right 10/02/2016   . Mediastinoscopy  11/13/2016   . Pb forearm/wrist surgery unlisted  1999   . Portacath placement  12/12/2016       Family History:  Family  Medical History:     Problem Relation (Age of Onset)    Breast Cancer Mother    Diabetes Paternal Grandfather, Brother    No Known Problems Father, Sister, Maternal Grandmother, Maternal Grandfather, Paternal 64, Daughter, Son, Maternal Aunt, Maternal Uncle, Paternal 41, Paternal Uncle, Other          Social History:  Social History     Tobacco Use   . Smoking status: Former Smoker     Years: 20.00     Types: Cigarettes     Start date: 12/09/1970     Last attempt to quit: 09/08/1998     Years since quitting: 20.5   . Smokeless tobacco: Never Used   Substance Use Topics   . Alcohol use: No   . Drug use: No     Social History     Substance and Sexual Activity   Drug Use No       Medications:  Current Outpatient Medications   Medication Sig   . cycloSPORINE (RESTASIS) 0.05 % Ophthalmic Dropperette Instill 1 Drop into both eyes Every 12 hours   . levothyroxine (SYNTHROID) 75  mcg Oral Tablet Take 75 mcg by mouth Every morning   . loratadine (CLARITIN) 10 mg Oral Tablet take 10 mg by mouth Once a day.   . Nifedipine (ADALAT CC) 30 mg Oral Tablet Sustained Release take 30 mg by mouth Once a day.   . OMEGA-3 FATTY ACIDS (FISH OIL ORAL) take  by mouth Once a day.   Marland Kitchen PREMPRO 0.3-1.5 mg Oral Tablet TAKE ONE TABLET BY MOUTH EVERY DAY     Allergies:  Allergies   Allergen Reactions   . Penicillins        PHYSICAL EXAM     Vitals:  Filed Vitals:    04/05/19 1915   BP: (!) 87/61   Pulse: (!) 108   Resp: 17   Temp: 36.8 C (98.2 F)   SpO2: 98%     Pulse ox  98% on None (Room Air) interpreted by me as: Normal    Constitutional: The patient is alert and oriented to person, place, and time. Well-developed and well-nourished.  HENT: Atraumatic, normocephalic head. Mucous membranes moist. TM's clear, Nares unremarkable. Oropharynx shows no erythema or exudate.   Eyes: Pupils equal and round, reactive to light. No scleral icterus. Normal conjunctiva. Extraocular movements are intact.  Neck: Supple, non-tender, no nuchal  rigidity, no adenopathy.   Lungs: Clear to auscultation bilaterally. Symmetric and equal expansion. No respiratory distress or retractions.  Cardiovascular: Heart is S1-S2 tachycardic rate and regular rhythm without murmur click or rub.  Abdomen:  Soft, non-distended. No tenderness to palpation without evidence of rebound or guarding. No pulsatile masses. No organomegaly.   Genitourinary: No CVA tenderness.  Extremities: Full range of motion, no clubbing, cyanosis, or edema. Pulses 2+, capillary refill <2 seconds.  Spine: No midline or paraspinal muscle tenderness to palpation. No step-off.   Skin: Warm and dry. No cyanosis, jaundice, rash or lesion.  Neurologic: Alert and oriented x3. Normal facial symmetry and speech, Normal upper and lower extremity strength, and grossly normal sensation. Anxious.     DIFFERENTIAL DIAGNOSES     1. Syncope  2. PE  3. Pneumonia  4. Vagal event  5. Hyper/hypoglycemia     DIAGNOSTIC STUDIES     Labs:    Results for orders placed or performed during the hospital encounter of 04/05/19   COMPREHENSIVE METABOLIC PROFILE - [facility] ONLY   Result Value Ref Range    SODIUM 137 136 - 145 mmol/L    POTASSIUM 4.7 3.4 - 5.1 mmol/L    CHLORIDE 103 101 - 111 mmol/L    CO2 TOTAL 24 22 - 32 mmol/L    ANION GAP 10 3 - 11 mmol/L    BUN 8 6 - 20 mg/dL    CREATININE 0.84 0.44 - 1.00 mg/dL    BUN/CREA RATIO 10 6 - 22    ESTIMATED GFR >60 >60 mL/min/1.72m^2    ALBUMIN 3.4 (L) 3.5 - 5.0 g/dL    CALCIUM 9.1 8.8 - 10.2 mg/dL    GLUCOSE 123 (H) 70 - 110 mg/dL    ALKALINE PHOSPHATASE 55 38 - 126 U/L    ALT (SGPT) 13 (L) 14 - 54 U/L    AST (SGOT) 20 15 - 41 U/L    BILIRUBIN TOTAL 0.3 0.3 - 1.2 mg/dL    PROTEIN TOTAL 7.3 6.4 - 8.3 g/dL    ALBUMIN/GLOBULIN RATIO 0.9 0.8 - 2.0   TROPONIN-I   Result Value Ref Range    TROPONIN I <0.03 <=0.06 ng/mL   CBC WITH DIFF  Result Value Ref Range    WBC 7.6 4.0 - 11.0 x10^3/uL    RBC 4.15 4.00 - 5.10 x10^6/uL    HGB 12.6 12.0 - 15.5 g/dL    HCT 37.0 36.0 - 45.0 %    MCV  89.2 82.0 - 97.0 fL    MCH 30.4 27.5 - 33.2 pg    MCHC 34.1 32.0 - 36.0 g/dL    RDW 13.5 11.0 - 16.0 %    PLATELETS 279 150 - 450 x10^3/uL    MPV 8.0 7.4 - 10.5 fL    NEUTROPHIL % 76 43 - 76 %    LYMPHOCYTE % 14 (L) 15 - 43 %    MONOCYTE % 8 5 - 12 %    EOSINOPHIL % 2 0 - 5 %    BASOPHIL % 1 0 - 3 %    NEUTROPHIL # 5.80 1.50 - 6.50 x10^3/uL    LYMPHOCYTE # 1.00 1.00 - 4.80 x10^3/uL    MONOCYTE # 0.60 0.20 - 0.90 x10^3/uL    EOSINOPHIL # 0.10 0.00 - 0.50 x10^3/uL    BASOPHIL # 0.00 0.00 - 0.10 x10^3/uL   BLUE TOP TUBE   Result Value Ref Range    RAINBOW/EXTRA TUBE AUTO RESULT Yes    RED TOP TUBE   Result Value Ref Range    RAINBOW/EXTRA TUBE AUTO RESULT Yes    URINALYSIS WITH MICROSCOPIC REFLEX IF INDICATED [facility] ONLY   Result Value Ref Range    COLOR Light Yellow Light Yellow, Straw, Yellow    APPEARANCE Clear Clear    PH 6.5 <8.0    LEUKOCYTES Moderate (A) Negative WBCs/uL    NITRITE Negative Negative    PROTEIN Negative Negative, 10  mg/dL    GLUCOSE Negative Negative mg/dL    KETONES Negative Negative mg/dL    UROBILINOGEN 0.2  <=2.0 mg/dL    BILIRUBIN Negative Negative mg/dL    BLOOD Negative Negative mg/dL    SPECIFIC GRAVITY <=1.005 <1.022   LACTIC ACID LEVEL W/ REFLEX FOR LEVEL >2.0   Result Value Ref Range    LACTIC ACID 1.2 0.5 - 2.0 mmol/L   THYROID STIMULATING HORMONE WITH FREE T4 REFLEX   Result Value Ref Range    TSH 2.438 0.340 - 5.330 uIU/mL   URINALYSIS, MICROSCOPIC   Result Value Ref Range    RBCS 0-2 0 - 2 /hpf    WBCS 10-20 (A) 0 - 2 /hpf    BACTERIA Moderate (A) None /hpf    SQUAMOUS EPITHELIAL 2-5 (A) 0 - 2 /hpf     Labs reviewed and interpreted by me.    Radiology:    CT ANGIO CHEST FOR PULMONARY EMBOLUS W IV CONTRAST   Final Result      Patchy infiltrates at the periphery of both lungs. Small area of subpleural   consolidation in the posterior aspect of the left lower lobe..      Mildly enlarged subcarinal and left hilar lymph nodes.      Chronic scarring at the right lung apex.      Trace  amount of left pleural effusion.      No evidence for PE.       MIPS PERFORMANCE METRICS:   1. Dose reduction technique used. Automatic exposure control (AEC) was   applied.   2. Count of high-dose radiation studies (CT and cardiac and NM) last 12   months: .          Radiologist location ID: [id number]  Radiological imaging interpreted by radiologist. Report reviewed by me.    EKG:  12 lead EKG interpreted by me shows sinus rhythm 1st degree AV block, rate of 99 bpm, left axis, nonspecific ST/T abnormalities.     ED PROGRESS NOTE / [place] records reviewed by me:  I have reviewed the nurse's notes. I have reviewed the patient's problem list and pertinent past medical records.     Orders Placed This Encounter   . CT ANGIO CHEST FOR PULMONARY EMBOLUS W IV CONTRAST   . COMPREHENSIVE METABOLIC PROFILE - [facility] ONLY   . TROPONIN-I   . CBC WITH DIFF   . URINALYSIS WITH MICROSCOPIC REFLEX IF INDICATED [facility] ONLY   . LACTIC ACID LEVEL W/ REFLEX FOR LEVEL >2.0   . THYROID STIMULATING HORMONE WITH FREE T4 REFLEX   . URINALYSIS, MICROSCOPIC   . ECG 12-LEAD (Take to provider with a brief history)   . NS bolus infusion 1,000 mL   . doxycycline tablet   . doxycycline hyclate (VIBRAMYCIN) 100 mg Oral Capsule          21:40: Initial evaluation is complete at this time. I discussed with the patient that I would order a CT of her chest to further evaluate. Patient is agreeable with the treatment plan at this time.     23:27: On recheck, the patient is in no acute distress. I explained the results of the diagnostic studies. Patient will be discharged with Vibramycin. Patient is to follow up with Lanier Ensign in 2 days. I discussed the diagnosis, disposition, and follow-up plan. The patient understood and in accordance with the treatment plan at this time. Patient is to return here to the Emergency Department if new or worsening symptoms appear. All of their questions have been answered to their  satisfaction. The patient is in stable condition at the time of discharge.     MIPS     Not applicable     OPIATE PRESCRIPTION       Not applicable    CORE MEASURES      Not applicable    CRITICAL CARE TIME      Not applicable     PRE-DISPOSITION VITALS      Pre-Disposition Vitals:  Filed Vitals:    04/05/19 2300 04/05/19 2315 04/05/19 2330 04/06/19 0000   BP: 131/69 134/72 (!) 146/86 125/68   Pulse: 93 100 (!) 110 96   Resp: 17 (!) 24 20 20    Temp:       SpO2:  99% 100% 100%     CLINICAL IMPRESSION     Encounter Diagnoses   Name Primary?   . UTI (urinary tract infection) Yes   . Pneumonia    . Syncope      DISPOSITION/PLAN     Discharged        Prescriptions:     Discharge Medication List as of 04/06/2019 12:00 AM      START taking these medications    Details   doxycycline hyclate (VIBRAMYCIN) 100 mg Oral Capsule Take 1 Cap (100 mg total) by mouth Twice daily for 10 days, Disp-20 Cap,R-0, Print             Follow-Up:     Lanier Ensign  [facility]  [place] [address]  [phone]    Call in 2 days  for follow up      Condition at Disposition:  Stable        SCRIBE ATTESTATION STATEMENT  I Arcola Jansky, SCRIBE scribed for Gina Chamber, MD on 04/05/2019 at 9:37 PM.     Documentation assistance provided for Gina Chamber, MD  by Arcola Jansky, [place]. Information recorded by the scribe was done at my direction and has been reviewed and validated by me Gina Chamber, MD.

## 2019-04-05 NOTE — ED Triage Notes (Signed)
Pt daughter reports that pt had a witnessed syncopal episode while standing during conversation. Pt was lowered to the ground and denies any injury. Daughter checked sugar which read 60. Daughter also reports that pt has been eating less. Pt denies any distress at this time.

## 2019-04-05 NOTE — ED Nurses Note (Signed)
Pt now reporting shoulder pain.

## 2019-04-06 LAB — ECG 12-LEAD
Atrial Rate: 99 {beats}/min
Calculated P Axis: 79 degrees
Calculated R Axis: -38 degrees
Calculated T Axis: 63 degrees
PR Interval: 218 ms
QRS Duration: 66 ms
QT Interval: 362 ms
QTC Calculation: 464 ms
Ventricular rate: 99 {beats}/min

## 2019-04-06 LAB — RED TOP TUBE

## 2019-04-06 NOTE — ED Nurses Note (Signed)
Patient discharged home with family.  AVS reviewed with patient/care giver.  A written copy of the AVS and discharge instructions was given to the patient/care giver.  Questions sufficiently answered as needed.  Patient/care giver encouraged to follow up with PCP as indicated.  In the event of an emergency, patient/care giver instructed to call 911 or go to the nearest emergency room.        Current Discharge Medication List      START taking these medications.      Details   doxycycline hyclate 100 mg Capsule  Commonly known as:  VIBRAMYCIN   100 mg, Oral, 2 TIMES DAILY  Qty:  20 Cap  Refills:  0        CONTINUE these medications - NO CHANGES were made during your visit.      Details   FISH OIL ORAL   DAILY  Refills:  0     levothyroxine 75 mcg Tablet  Commonly known as:  SYNTHROID   75 mcg, Oral, EVERY MORNING  Refills:  0     loratadine 10 mg Tablet  Commonly known as:  CLARITIN   10 mg, DAILY  Refills:  0     NIFEdipine 30 mg Tablet Sustained Release  Commonly known as:  ADALAT CC   30 mg, DAILY  Refills:  0     Prempro 0.3-1.5 mg Tablet  Generic drug:  Conj Estrog-Medroxyprogest Ace   TAKE ONE TABLET BY MOUTH EVERY DAY  Qty:  28 Tab  Refills:  4     Restasis 0.05 % Dropperette  Generic drug:  cycloSPORINE   1 Drop, EVERY 12 HOURS  Refills:  0

## 2019-04-11 ENCOUNTER — Encounter (INDEPENDENT_AMBULATORY_CARE_PROVIDER_SITE_OTHER): Payer: Self-pay | Admitting: Family

## 2019-04-11 ENCOUNTER — Ambulatory Visit (INDEPENDENT_AMBULATORY_CARE_PROVIDER_SITE_OTHER): Payer: Commercial Managed Care - POS | Admitting: Family

## 2019-04-11 VITALS — BP 118/76 | HR 100 | Temp 98.6°F | Resp 18 | Ht 61.0 in | Wt 121.8 lb

## 2019-04-11 DIAGNOSIS — N3000 Acute cystitis without hematuria: Secondary | ICD-10-CM

## 2019-04-11 DIAGNOSIS — J189 Pneumonia, unspecified organism: Secondary | ICD-10-CM

## 2019-04-11 DIAGNOSIS — S40012A Contusion of left shoulder, initial encounter: Secondary | ICD-10-CM

## 2019-04-11 NOTE — Progress Notes (Signed)
Subjective:    Patient ID: Melody Hendricks is a 63 y.o. female.    Here to follow up Ed visit. She had a snycopal episode on Tuesday last week. She states she was outside and began to feel hot and the next thing she remembers she was laying on the ground looking up at her neighbor. She states that her lneighbor laid her on the ground. She complains of pain in her left shoulder. She has large area of bruising and limited range of motion of left shoulder.  When she has the episode her daughter checked her blood sugar and it was 60. She drank some orange juice and it came up to 90. She did not to the ED for about 3 hours after the episode. She denies any cough or urinary symptoms today. She is currently taking doxycline without any reported side effects.   During her hospitalization, she had a CT of the chest that revealed the following:    IMPRESSION:    Patchy infiltrates at the periphery of both lungs. Small area of subpleural  consolidation in the posterior aspect of the left lower lobe..    Mildly enlarged subcarinal and left hilar lymph nodes.    Chronic scarring at the right lung apex.    Trace amount of left pleural effusion.    No evidence for PE.     MIPS PERFORMANCE METRICS:  1. Dose reduction technique used. Automatic exposure control (AEC) was  applied.  2. Count of high-dose radiation studies (CT and cardiac and NM) last 12  months: .     The above Ct was reviewed by me and I agree with the interpretation.     UA found leukocytes, otherwise normal     The hospital record was reviewed by me and discussed with the patient.         The following portions of the patient's history were reviewed and updated as appropriate: allergies, current medications, past family history, past medical history, past social history, past surgical history and problem list.    Review of Systems   Constitutional: Negative for activity change, appetite change, chills, fatigue and fever.   HENT: Negative for sinus pressure  and sinus pain.    Eyes: Negative for visual disturbance.   Respiratory: Negative for chest tightness and shortness of breath.    Cardiovascular: Negative for chest pain.   Gastrointestinal: Negative for abdominal pain, constipation, diarrhea, nausea and vomiting.   Endocrine: Negative for polydipsia and polyphagia.   Genitourinary: Negative for difficulty urinating.   Musculoskeletal: Positive for arthralgias.        Pain and decreased ROM in left shoulder    Skin: Negative for rash.        Large area of bruising to left shoulder down into the left breast    Neurological: Negative for headaches.   Psychiatric/Behavioral: Negative for suicidal ideas.   All other systems reviewed and are negative.          Objective:    Physical Exam  Vitals signs reviewed.   Constitutional:       Appearance: Normal appearance.   HENT:      Head: Normocephalic and atraumatic.      Right Ear: Tympanic membrane, ear canal and external ear normal.      Left Ear: Tympanic membrane, ear canal and external ear normal.      Nose: Nose normal.      Mouth/Throat:      Mouth: Mucous membranes are moist.  Pharynx: Oropharynx is clear.   Eyes:      Extraocular Movements: Extraocular movements intact.      Conjunctiva/sclera: Conjunctivae normal.      Pupils: Pupils are equal, round, and reactive to light.   Neck:      Musculoskeletal: Normal range of motion and neck supple.   Cardiovascular:      Rate and Rhythm: Normal rate and regular rhythm.      Pulses: Normal pulses.      Heart sounds: Normal heart sounds.   Pulmonary:      Effort: Pulmonary effort is normal. No respiratory distress.      Breath sounds: Normal breath sounds.   Abdominal:      General: Bowel sounds are normal.      Palpations: Abdomen is soft.   Musculoskeletal:         General: Tenderness present.      Left shoulder: She exhibits decreased range of motion and tenderness.        Arms:    Skin:     General: Skin is warm.      Capillary Refill: Capillary refill takes less  than 2 seconds.      Findings: Bruising present.             Comments: Bruising to left eye yellow in color    Neurological:      General: No focal deficit present.      Mental Status: She is alert and oriented to person, place, and time.   Psychiatric:         Attention and Perception: Attention normal.         Mood and Affect: Mood normal.         Speech: Speech normal.         Behavior: Behavior normal.         Thought Content: Thought content normal.         Cognition and Memory: Cognition normal.         Judgment: Judgment normal.             Assessment:       1. Contusion of left shoulder, initial encounter    2. Acute cystitis without hematuria    3. Pneumonia due to infectious organism, unspecified laterality, unspecified part of lung          Plan:    1. X-ray if no improvement in pain and ROM.    2. Finish doxycycline   3. Finish Doxycycline    Follow up in 1 week    Orders Placed This Encounter   . XR Shoulder Left 2+ Views   . doxycycline (VIBRAMYCIN) 100 MG capsule

## 2019-04-15 ENCOUNTER — Other Ambulatory Visit (INDEPENDENT_AMBULATORY_CARE_PROVIDER_SITE_OTHER): Payer: Self-pay | Admitting: Family

## 2019-04-15 ENCOUNTER — Ambulatory Visit
Admission: RE | Admit: 2019-04-15 | Discharge: 2019-04-15 | Disposition: A | Discharge status: Home / Self Care | Payer: 59 | Source: Ambulatory Visit | Attending: NURSE PRACTITIONER | Admitting: NURSE PRACTITIONER

## 2019-04-15 ENCOUNTER — Other Ambulatory Visit: Payer: Self-pay

## 2019-04-15 ENCOUNTER — Other Ambulatory Visit (HOSPITAL_COMMUNITY): Payer: Self-pay | Admitting: NURSE PRACTITIONER

## 2019-04-15 ENCOUNTER — Ambulatory Visit (HOSPITAL_COMMUNITY): Payer: 59

## 2019-04-15 DIAGNOSIS — S42002A Fracture of unspecified part of left clavicle, initial encounter for closed fracture: Secondary | ICD-10-CM

## 2019-04-15 DIAGNOSIS — S42032A Displaced fracture of lateral end of left clavicle, initial encounter for closed fracture: Secondary | ICD-10-CM | POA: Insufficient documentation

## 2019-04-15 DIAGNOSIS — S40012A Contusion of left shoulder, initial encounter: Secondary | ICD-10-CM

## 2019-04-18 ENCOUNTER — Ambulatory Visit (INDEPENDENT_AMBULATORY_CARE_PROVIDER_SITE_OTHER): Payer: Commercial Managed Care - POS | Admitting: Family

## 2019-04-18 ENCOUNTER — Telehealth (HOSPITAL_COMMUNITY): Payer: Self-pay

## 2019-04-18 NOTE — Telephone Encounter (Signed)
Pt called to update Dr. Holley Dexter on new meds and issues. Pt passed out and went to ED on 10/20. Pt was diagnosed with UTI and put on Doxycycline. Course completed 10/31. 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 Rhuematologist also dx possible autoimmune disease and is putting pt on methotrexate 2.5 mg. Pt wants to make sure this will not interfere with any meds currently being taken. Will notify Dr. Holley Dexter.    Servando Snare  Triage Nurse

## 2019-04-19 ENCOUNTER — Telehealth (INDEPENDENT_AMBULATORY_CARE_PROVIDER_SITE_OTHER): Payer: Self-pay

## 2019-04-19 ENCOUNTER — Telehealth (HOSPITAL_COMMUNITY): Payer: Self-pay | Admitting: Hematology & Oncology

## 2019-04-19 NOTE — Telephone Encounter (Signed)
Per Dr. Holley Dexter, re phone call yesterday, pt should check in with PCP re update and review meds with her. Pt is currently being seen here for monitoring purposes only. Next appt scheduled in January of next year. Spoke with pt this AM and she verbalized understanding. She is updating PCP today.    Servando Snare  Triage Nurse

## 2019-04-21 NOTE — Telephone Encounter (Signed)
I spoke to Dr. Lahoma Rocker. He started patient on Neurontin for his back issue    Marya Landry, FNP

## 2019-04-21 NOTE — Telephone Encounter (Signed)
I spoke to patient about seeing the specialist.   She states that she has RA and an unknown autoimmune disorder.  She will start methotrexate after she finishes ABX.  She states that she already has an order to recheck CXR to make sure pneumonia is resolved.   Told to hold off taking her methotrexate until she has a clear CXR.

## 2019-04-29 ENCOUNTER — Encounter (INDEPENDENT_AMBULATORY_CARE_PROVIDER_SITE_OTHER): Payer: Self-pay

## 2019-04-29 NOTE — Progress Notes (Signed)
scans

## 2019-05-02 ENCOUNTER — Telehealth (INDEPENDENT_AMBULATORY_CARE_PROVIDER_SITE_OTHER): Payer: Self-pay

## 2019-05-02 ENCOUNTER — Ambulatory Visit
Admission: RE | Admit: 2019-05-02 | Discharge: 2019-05-02 | Disposition: A | Discharge status: Home / Self Care | Payer: 59 | Source: Ambulatory Visit | Attending: Specialist | Admitting: Specialist

## 2019-05-02 ENCOUNTER — Other Ambulatory Visit: Payer: Self-pay

## 2019-05-02 ENCOUNTER — Other Ambulatory Visit (HOSPITAL_COMMUNITY): Payer: Self-pay | Admitting: Specialist

## 2019-05-02 ENCOUNTER — Ambulatory Visit (HOSPITAL_COMMUNITY): Payer: 59

## 2019-05-02 DIAGNOSIS — M059 Rheumatoid arthritis with rheumatoid factor, unspecified: Secondary | ICD-10-CM

## 2019-05-02 DIAGNOSIS — J439 Emphysema, unspecified: Secondary | ICD-10-CM | POA: Insufficient documentation

## 2019-05-02 LAB — CBC WITH DIFF
BASOPHIL #: 0.1 10*3/uL (ref 0.00–0.10)
BASOPHIL %: 1 % (ref 0–3)
EOSINOPHIL #: 0.2 10*3/uL (ref 0.00–0.50)
EOSINOPHIL %: 3 % (ref 0–5)
HCT: 36.8 % (ref 36.0–45.0)
HGB: 12.5 g/dL (ref 12.0–15.5)
LYMPHOCYTE #: 1.7 10*3/uL (ref 1.00–4.80)
LYMPHOCYTE %: 28 % (ref 15–43)
MCH: 30.3 pg (ref 27.5–33.2)
MCHC: 33.9 g/dL (ref 32.0–36.0)
MCV: 89.5 fL (ref 82.0–97.0)
MONOCYTE #: 0.7 10*3/uL (ref 0.20–0.90)
MONOCYTE %: 11 % (ref 5–12)
MPV: 8.9 fL (ref 7.4–10.5)
NEUTROPHIL #: 3.4 10*3/uL (ref 1.50–6.50)
NEUTROPHIL %: 57 % (ref 43–76)
PLATELETS: 315 10*3/uL (ref 150–450)
RBC: 4.12 10*6/uL (ref 4.00–5.10)
RDW: 13.8 % (ref 11.0–16.0)
WBC: 6 10*3/uL (ref 4.0–11.0)

## 2019-05-02 LAB — HEPATIC FUNCTION PANEL
ALBUMIN/GLOBULIN RATIO: 0.9 (ref 0.8–2.0)
ALBUMIN: 3.5 g/dL (ref 3.5–5.0)
ALKALINE PHOSPHATASE: 54 U/L (ref 38–126)
ALT (SGPT): 13 U/L — ABNORMAL LOW (ref 14–54)
AST (SGOT): 24 U/L (ref 15–41)
BILIRUBIN DIRECT: 0.1 mg/dL (ref 0.1–0.5)
BILIRUBIN TOTAL: 0.5 mg/dL (ref 0.3–1.2)
PROTEIN TOTAL: 7.2 g/dL (ref 6.4–8.3)

## 2019-05-02 LAB — CREATININE WITH EGFR
CREATININE: 0.7 mg/dL (ref 0.44–1.00)
ESTIMATED GFR: >60 mL/min/{1.73_m2} (ref >60)

## 2019-05-02 LAB — BUN: BUN: 9 mg/dL (ref 6–20)

## 2019-05-02 LAB — C-REACTIVE PROTEIN(CRP),INFLAMMATION: CRP INFLAMMATION: 9.1 mg/L — ABNORMAL HIGH (ref <=8.0)

## 2019-05-02 LAB — SEDIMENTATION RATE: ERYTHROCYTE SEDIMENTATION RATE (ESR): 42 mm/h — ABNORMAL HIGH (ref 0–30)

## 2019-05-06 NOTE — Telephone Encounter (Signed)
Done

## 2019-05-11 LAB — MISC INT TEST 3 - FROZEN

## 2019-05-27 ENCOUNTER — Ambulatory Visit: Payer: 59 | Attending: Hematology & Oncology | Admitting: Hematology & Oncology

## 2019-05-27 ENCOUNTER — Other Ambulatory Visit: Payer: Self-pay

## 2019-05-27 ENCOUNTER — Encounter (HOSPITAL_COMMUNITY): Payer: Self-pay | Admitting: Hematology & Oncology

## 2019-05-27 VITALS — BP 127/77 | HR 117 | Temp 97.5°F | Resp 18 | Ht 60.98 in | Wt 117.4 lb

## 2019-05-27 DIAGNOSIS — J984 Other disorders of lung: Secondary | ICD-10-CM | POA: Insufficient documentation

## 2019-05-27 DIAGNOSIS — E039 Hypothyroidism, unspecified: Secondary | ICD-10-CM | POA: Insufficient documentation

## 2019-05-27 DIAGNOSIS — Z6822 Body mass index (BMI) 22.0-22.9, adult: Secondary | ICD-10-CM | POA: Insufficient documentation

## 2019-05-27 DIAGNOSIS — Z79899 Other long term (current) drug therapy: Secondary | ICD-10-CM | POA: Insufficient documentation

## 2019-05-27 DIAGNOSIS — Z86010 Personal history of colonic polyps: Secondary | ICD-10-CM | POA: Insufficient documentation

## 2019-05-27 DIAGNOSIS — S22050A Wedge compression fracture of T5-T6 vertebra, initial encounter for closed fracture: Secondary | ICD-10-CM | POA: Insufficient documentation

## 2019-05-27 DIAGNOSIS — Z79890 Hormone replacement therapy (postmenopausal): Secondary | ICD-10-CM | POA: Insufficient documentation

## 2019-05-27 DIAGNOSIS — E669 Obesity, unspecified: Secondary | ICD-10-CM | POA: Insufficient documentation

## 2019-05-27 DIAGNOSIS — R590 Localized enlarged lymph nodes: Secondary | ICD-10-CM | POA: Insufficient documentation

## 2019-05-27 DIAGNOSIS — C3491 Malignant neoplasm of unspecified part of right bronchus or lung: Secondary | ICD-10-CM | POA: Insufficient documentation

## 2019-05-27 DIAGNOSIS — R9389 Abnormal findings on diagnostic imaging of other specified body structures: Secondary | ICD-10-CM

## 2019-05-27 DIAGNOSIS — I7300 Raynaud's syndrome without gangrene: Secondary | ICD-10-CM | POA: Insufficient documentation

## 2019-05-27 DIAGNOSIS — J90 Pleural effusion, not elsewhere classified: Secondary | ICD-10-CM | POA: Insufficient documentation

## 2019-05-27 DIAGNOSIS — M810 Age-related osteoporosis without current pathological fracture: Secondary | ICD-10-CM | POA: Insufficient documentation

## 2019-05-27 NOTE — Progress Notes (Signed)
[place], [place]  Gina Barrett [id number]    Return Patient Progress Note    Date: 05/27/2019    Name: Gina Barrett  MRN: [id number]  Referring Physician: No ref. 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  -PET-CT scan on 09/06/2016 showed 10 mm right upper lobe FDG avid lesion that is slightly larger than 2017 CT and concerning for malignancy.  A separate 3 cm long area of tracer uptake in the left lower lobe was noted.  -CT-guided biopsy a [facility] on 10/02/2016 was positive for adenocarcinoma that is well to moderately differentiated.  -Mediastinoscopy 11/13/2016 positive for left mid paratrachael    Stage: IIIB     Molecular/special studies:  unknown    Treatment & Surveillance:   1. S/p chemoradiation with carboplatin + paclitaxel + RT 66.6 Gy completed 02/10/19/18    2. Serial imaging since completion of therapy has been negative for recurrence.  Most recent imaging in on 12/03/2018 was also negative but showed a new T6 compression abnormality.      Interval History:  Gina Barrett is a 63 y.o. female with stage IIIB non-small cell lung cancer diagnosed 20 months ago who returns to clinic for follow-up earlier than expected because of apparent abnormality found on on planned CT scan chest in the emergency room 04/06/2019.  Patient reports this was talking to her neighbor when she suddenly passed out without any antecedent symptoms.  She had been feeling well and reports that in vent was not inspected.  She was taken to the emergency room where her  evaluation did not reveal the cause her syncope/vasovagal event.  CT angiogram performed part of this evaluation was reported as showing patchy infiltrates at the periphery of both lungs, small area sub pleural consolidation in the posterior aspect the left lower lobe, mildly enlarged subcarinal and left hilar lymph nodes, chronic scarring at the right lung apex, trace amount of left pleural effusion without any evidence of pulmonary embolism.  Because of these findings, patient returns to clinic today to discuss result and concern for recurrence of her disease.  She feels well and has no headache, constitutional, cardiopulmonary, GI or GU symptoms at this time.  She has had no recurrence of the event.  She had seen her rheumatologist and was given a prescription of methotrexate which she has not started and the support of her question this visit whether to begin this treatment.      ROS:   Negative except for Interval history above.        Past Medical History  Past Medical History:   Diagnosis Date   . Abnormal Pap smear     ASCUS, ASCUS cannot rule out HGSIL   . Cancer (CMS HCC)     non small cell lung cancer (R)-s/p Chemo/Radiation completed 8/18   . Colon polyp    . Dysphagia    . Hypothyroid    . Non-small cell lung cancer, right (CMS [facility])     s/p Chemo completed 9/18   . Raynaud's disease    .  STD (sexually transmitted disease)     HSV   . Unspecified disorder of thyroid     Hypothyroidism       Current Outpatient Medications   Medication Sig   . cycloSPORINE (RESTASIS) 0.05 % Ophthalmic Dropperette Instill 1 Drop into both eyes Every 12 hours   . levothyroxine (SYNTHROID) 75 mcg Oral Tablet Take 75 mcg by mouth Every morning   . loratadine (CLARITIN) 10 mg Oral Tablet take 10 mg by mouth Once a day.   . Nifedipine (ADALAT CC) 30 mg Oral Tablet Sustained Release take 30 mg by mouth Once a day.   . OMEGA-3 FATTY ACIDS (FISH OIL ORAL) take  by mouth Once a day.   Marland Kitchen PREMPRO 0.3-1.5 mg Oral Tablet TAKE ONE TABLET  BY MOUTH EVERY DAY        Physical Examination:  BP 127/77 (Site: Left, Patient Position: Sitting)   Pulse (!) 117   Temp 36.4 C (97.5 F)   Resp 18   Ht 1.549 m (5' 0.98")   Wt 53.2 kg (117 lb 6 oz)   SpO2 99%   BMI 22.19 kg/m      ECOG Status: 1 - Restricted in physically strenuous activity, but ambulatory and able to carry out work of a light or sedentary nature, e.g., light housework, office work.   General:Appears well, in NAD   HENT: No oral lesions.    Lungs: clear to auscultation bilaterally.   Cardiovascular: RRR without murmur.  Abdomen: soft, non-tender, non-distended, no hepatosplenomegaly or palpable masses  Extremities: No edema bilaterally  Skin: No clinically significant rashes    Labs:  Results for Gina Barrett (MRN [id number]) as of 05/29/2019 09:50   Ref. Range 05/02/2019 09:13   WBC Latest Ref Range: 4.0 - 11.0 x10^3/uL 6.0   HGB Latest Ref Range: 12.0 - 15.5 g/dL 12.5   HCT Latest Ref Range: 36.0 - 45.0 % 36.8   PLATELET COUNT Latest Ref Range: 150 - 450 x10^3/uL 315   SEDIMENTATION RATE Latest Ref Range: 0 - 30 mm/hr 42 (H)   RBC Latest Ref Range: 4.00 - 5.10 x10^6/uL 4.12   MCV Latest Ref Range: 82.0 - 97.0 fL 89.5   MCHC Latest Ref Range: 32.0 - 36.0 g/dL 33.9   MCH Latest Ref Range: 27.5 - 33.2 pg 30.3   RDW Latest Ref Range: 11.0 - 16.0 % 13.8   MPV Latest Ref Range: 7.4 - 10.5 fL 8.9   PMN'S Latest Ref Range: 43 - 76 % 57   LYMPHOCYTES Latest Ref Range: 15 - 43 % 28   EOSINOPHIL Latest Ref Range: 0 - 5 % 3   MONOCYTES Latest Ref Range: 5 - 12 % 11   BASOPHILS Latest Ref Range: 0 - 3 % 1   PMN ABS Latest Ref Range: 1.50 - 6.50 x10^3/uL 3.40   LYMPHS ABS Latest Ref Range: 1.00 - 4.80 x10^3/uL 1.70   EOS ABS Latest Ref Range: 0.00 - 0.50 x10^3/uL 0.20   MONOS ABS Latest Ref Range: 0.20 - 0.90 x10^3/uL 0.70   BASOS ABS Latest Ref Range: 0.00 - 0.10 x10^3/uL 0.10   BUN Latest Ref Range: 6 - 20 mg/dL 9   CREATININE Latest Ref Range: 0.44 - 1.00 mg/dL 0.70   ESTIMATED GLOMERULAR  FILTRATION RATE Latest Ref Range: >60 mL/min/1.58m^2 >60   TOTAL PROTEIN Latest Ref Range: 6.4 - 8.3 g/dL 7.2   ALBUMIN Latest Ref Range: 3.5 - 5.0 g/dL 3.5   BILIRUBIN, TOTAL Latest Ref  Range: 0.3 - 1.2 mg/dL 0.5   BILIRUBIN,CONJUGATED Latest Ref Range: 0.1 - 0.5 mg/dL 0.1   AST (SGOT) Latest Ref Range: 15 - 41 U/L 24   ALT (SGPT) Latest Ref Range: 14 - 54 U/L 13 (L)   ALKALINE PHOSPHATASE Latest Ref Range: 38 - 126 U/L 54   A/G RATIO Latest Ref Range: 0.8 - 2.0  0.9   C-REACTIVE PROTEIN HIGH SENSITIVITY (INFLAMMATION) Latest Ref Range: <=8.0 mg/L mg/L 9.1 (H)       Radiology:    CT angiogram 04/05/2019  RADIOLOGIST: Lars Masson, MD    EXAMINATION: CT ANGIO CHEST FOR PULMONARY EMBOLUS W IV CONTRAST     TECHNIQUE: Axial plane images were performed. Coronal and Sagittal  reformatted image were obtained.  CLINICAL INDICATION: syncope, tachy    DLP: 720.80 mGy.cm    CONTRAST:  Isovue 370    FINDINGS:    There are mildly enlarged subcarinal and left hilar lymph nodes. The  subcarinal lymph node measures 2 x 1.4 cm. Left hilar lymph node measures  1.4 cm.    Evaluation of the pulmonary arteries demonstrates no evidence for PE.    There is chronic scarring at the right lung apex.    There are subtle patchy infiltrates along the periphery of both lungs.  There is a small area of consolidation in the posterior subpleural area of  the left lower lobe.    There is a trace amount of pleural effusion on the left side.    There is an old compression fracture of the T6 vertebrae     IMPRESSION:    Patchy infiltrates at the periphery of both lungs. Small area of subpleural  consolidation in the posterior aspect of the left lower lobe..    Mildly enlarged subcarinal and left hilar lymph nodes.    Chronic scarring at the right lung apex.    Trace amount of left pleural effusion.    No evidence for PE      Assessment:   1. Non-small cell lung cancer, right (CMS [facility])  63 year old woman with stage III non-small cell  lung cancer diagnosed and treated as above who remains in clinical and radiographic remission 2 years after completing therapy.  I pulled and reviewed her CT scan chest from 11/2018 done a progressive Radiology in [place] and compared it recent ER study from October.  I informed her that I do not see any significant change dose to imaging studies and recommended that we repeat a CT scan a progressive Radiology or in April 2021 unless she does have any new symptoms that would prompt early repeat a scan.  With regards to her syncope versus vasovagal event, this obesity will need to be monitored and if recurrence, we need to see cardiology for Holter monitoring.    2. Closed wedge compression fracture of T6 vertebra (CMS HCC)  Her bone density on 01/20/2019 showed that she has osteoporosis with 10 years probability of fracture 23.7% and hip fracture of 9.4%.        Return in about 18 weeks (around 09/30/2019).    CC:  PCP General:  Lanier Ensign  [facility] [place] [id number]

## 2019-05-29 ENCOUNTER — Encounter (HOSPITAL_COMMUNITY): Payer: Self-pay | Admitting: Hematology & Oncology

## 2019-06-24 ENCOUNTER — Other Ambulatory Visit (INDEPENDENT_AMBULATORY_CARE_PROVIDER_SITE_OTHER): Payer: Self-pay | Admitting: OBSTETRICS/GYNECOLOGY

## 2019-06-24 DIAGNOSIS — Z79890 Hormone replacement therapy (postmenopausal): Secondary | ICD-10-CM

## 2019-07-08 ENCOUNTER — Other Ambulatory Visit (INDEPENDENT_AMBULATORY_CARE_PROVIDER_SITE_OTHER): Payer: Self-pay | Admitting: Family

## 2019-07-13 ENCOUNTER — Encounter (HOSPITAL_COMMUNITY): Payer: 59 | Admitting: Hematology & Oncology

## 2019-07-15 ENCOUNTER — Other Ambulatory Visit (INDEPENDENT_AMBULATORY_CARE_PROVIDER_SITE_OTHER): Payer: Self-pay | Admitting: Obstetrics & Gynecology

## 2019-07-15 DIAGNOSIS — Z79890 Hormone replacement therapy (postmenopausal): Secondary | ICD-10-CM

## 2019-07-15 MED ORDER — PREMPRO 0.3 MG-1.5 MG TABLET
ORAL_TABLET | ORAL | 3 refills | Status: DC
Start: 2019-07-15 — End: 2019-08-19

## 2019-07-15 NOTE — Telephone Encounter (Signed)
Pt is requesting refill for Prempro. Pt has an appt on 08/19/19 with Dr. Illene Bolus. Rae Halsted, [place]  07/15/2019, 12:28

## 2019-08-17 ENCOUNTER — Ambulatory Visit (INDEPENDENT_AMBULATORY_CARE_PROVIDER_SITE_OTHER): Payer: Commercial Managed Care - POS | Admitting: Family

## 2019-08-17 ENCOUNTER — Encounter (INDEPENDENT_AMBULATORY_CARE_PROVIDER_SITE_OTHER): Payer: Self-pay | Admitting: Family

## 2019-08-17 VITALS — BP 124/70 | HR 103 | Temp 100.2°F | Resp 18 | Ht 61.0 in | Wt 114.0 lb

## 2019-08-17 DIAGNOSIS — G8929 Other chronic pain: Secondary | ICD-10-CM

## 2019-08-17 DIAGNOSIS — M25512 Pain in left shoulder: Secondary | ICD-10-CM

## 2019-08-17 DIAGNOSIS — N3000 Acute cystitis without hematuria: Secondary | ICD-10-CM

## 2019-08-17 LAB — POCT URINALYSIS AUTOMATED (IAH)
Bilirubin, UA POCT: NEGATIVE
Blood, UA POCT: NEGATIVE
Glucose, UA POCT: NEGATIVE
Ketones, UA POCT: NEGATIVE mg/dL
Nitrite, UA POCT: POSITIVE — AB
PH, UA POCT: 7 (ref 4.6–8)
Protein, UA POCT: NEGATIVE mg/dL
Specific Gravity, UA POCT: 1.015 mg/dL (ref 1.001–1.035)
Urobilinogen, UA POCT: 0.2 mg/dL

## 2019-08-17 MED ORDER — SULFAMETHOXAZOLE-TRIMETHOPRIM 800-160 MG PO TABS
1.00 | ORAL_TABLET | Freq: Two times a day (BID) | ORAL | 0 refills | Status: DC
Start: ? — End: 2019-08-17

## 2019-08-17 NOTE — Progress Notes (Signed)
Subjective:    Patient ID: Melody Hendricks is a 64 y.o. female.    Here for evaluation for UTI symptoms for the past several weeks.    Patient also complains of left shoulder pain.  She reports that she has seen orthopedics in the past for her shoulder pain.  She states that she was told to follow-up with the specialist if her symptoms worsen or persist.  She reports that the pain in her left shoulder will wake her up at night if she rolls on that side.  She has tried injection and anti-inflammatories without good results.  She denies any injury.  She rates her pain a 3 out of 10.    Urinary Tract Infection Symptoms   This is a new problem. The current episode started 1 to 4 weeks ago. The problem occurs every urination. The problem has been gradually worsening. The pain is at a severity of 0/10. The patient is experiencing no pain. The maximum temperature recorded prior to her arrival was 100 - 100.9 F. She is sexually active. There is no history of pyelonephritis. Associated symptoms include frequency and urgency. Pertinent negatives include no chills, hematuria, nausea, sweats or vomiting. The treatment provided no relief.       The following portions of the patient's history were reviewed and updated as appropriate: allergies, current medications, past family history, past medical history, past social history, past surgical history and problem list.    Review of Systems   Constitutional: Negative for activity change, appetite change, chills and fatigue.   HENT: Negative for sinus pressure and sinus pain.    Eyes: Negative for visual disturbance.   Respiratory: Negative for chest tightness and shortness of breath.    Cardiovascular: Negative for chest pain.   Gastrointestinal: Negative for abdominal pain, constipation, diarrhea, nausea and vomiting.   Endocrine: Negative for polydipsia and polyphagia.   Genitourinary: Positive for frequency and urgency. Negative for difficulty urinating and hematuria.    Musculoskeletal: Negative for arthralgias.        Left shoulder pain   Skin: Negative for rash.   Neurological: Negative for headaches.   Psychiatric/Behavioral: Negative for suicidal ideas.   All other systems reviewed and are negative.          Objective:    Physical Exam  Vitals signs reviewed.   Constitutional:       Appearance: Normal appearance.   HENT:      Head: Normocephalic and atraumatic.      Right Ear: Tympanic membrane, ear canal and external ear normal.      Left Ear: Tympanic membrane, ear canal and external ear normal.      Nose: Nose normal.      Mouth/Throat:      Mouth: Mucous membranes are moist.      Pharynx: Oropharynx is clear.   Eyes:      Extraocular Movements: Extraocular movements intact.      Conjunctiva/sclera: Conjunctivae normal.      Pupils: Pupils are equal, round, and reactive to light.   Neck:      Musculoskeletal: Normal range of motion and neck supple.   Cardiovascular:      Rate and Rhythm: Normal rate and regular rhythm.      Pulses: Normal pulses.      Heart sounds: Normal heart sounds.   Pulmonary:      Effort: Pulmonary effort is normal. No respiratory distress.      Breath sounds: Normal breath sounds.  Abdominal:      General: Bowel sounds are normal.      Palpations: Abdomen is soft.      Tenderness: There is abdominal tenderness (suprapubic).   Musculoskeletal: Normal range of motion.      Left shoulder: She exhibits laceration and pain. She exhibits no deformity, no spasm, normal pulse and normal strength.   Skin:     General: Skin is warm.      Capillary Refill: Capillary refill takes less than 2 seconds.   Neurological:      General: No focal deficit present.      Mental Status: She is alert and oriented to person, place, and time.   Psychiatric:         Attention and Perception: Attention normal.         Mood and Affect: Mood normal.         Speech: Speech normal.         Behavior: Behavior normal.         Thought Content: Thought content normal.          Cognition and Memory: Cognition normal.         Judgment: Judgment normal.             Assessment:       1. Acute cystitis without hematuria    2. Chronic left shoulder pain          Plan:       1. Urine sent for culture. I will contact patient with results when available.  Prescription given for Bactrim twice daily x7 days.  Patient encouraged to make another appointment if her symptoms worsen or persists  2.  Encourage patient to make an appointment with orthopedics for further evaluation of her left shoulder pain    Follow up prn   Orders Placed This Encounter    [facility] Urine Culture    POCT UA Automated (urine dipstick)    sulfamethoxazole-trimethoprim (Bactrim DS) 800-160 MG per tablet

## 2019-08-18 ENCOUNTER — Other Ambulatory Visit
Admission: RE | Admit: 2019-08-18 | Discharge: 2019-08-18 | Disposition: A | Discharge status: Home / Self Care | Payer: Commercial Managed Care - POS | Source: Ambulatory Visit | Attending: Family | Admitting: Family

## 2019-08-18 DIAGNOSIS — N3000 Acute cystitis without hematuria: Secondary | ICD-10-CM

## 2019-08-19 ENCOUNTER — Other Ambulatory Visit: Payer: Self-pay

## 2019-08-19 ENCOUNTER — Encounter (INDEPENDENT_AMBULATORY_CARE_PROVIDER_SITE_OTHER): Payer: Self-pay | Admitting: Obstetrics & Gynecology

## 2019-08-19 ENCOUNTER — Ambulatory Visit (INDEPENDENT_AMBULATORY_CARE_PROVIDER_SITE_OTHER): Payer: 59 | Admitting: Obstetrics & Gynecology

## 2019-08-19 VITALS — BP 122/68 | HR 88 | Temp 97.5°F | Ht 61.0 in | Wt 113.0 lb

## 2019-08-19 DIAGNOSIS — Z1239 Encounter for other screening for malignant neoplasm of breast: Secondary | ICD-10-CM

## 2019-08-19 DIAGNOSIS — Z01419 Encounter for gynecological examination (general) (routine) without abnormal findings: Secondary | ICD-10-CM

## 2019-08-19 NOTE — Progress Notes (Signed)
[id number]  08/19/2019  [name]    Gina Barrett is a 64 y.o. female who presents for annual exam.   Patient with a history of non-small cell lung cancer status post chemoradiation completed in 2018. Had a recent PET scan in 2018 was noted to be negative for any recurrence.  Has had subsequent CT scans of the chest that she being followed with her colleges.  She denies any postmenopausal bleeding.  Patient is not on any hormone replacement therapy.  Has been concerned about some vaginal dryness and discomfort with sexual activity.  Had some vaginal dryness. Was using lubrication. PAP 2019 was normal with negative HPV. Last mammogram 2019 was also noted to be normal..  Has not felt anything of concern.  Has a PCP is following up for other health needs.  She has received vaccinations as well as had colonoscopies.  She is due to get a DEXA scan.  Patient hs known vaginal polyp. [place] in 2019 negative for any concerns.  PET scan in 2018 November did not show any evidence of pickup in that area.  Suspect that these are benign.  Does been no interval change in the area since last couple of exams.  patient was on prempro. Have advised to stop medication. Patient states is not taking it. No PMB  Past Medical History:   Diagnosis Date   . Abnormal Pap smear     ASCUS, ASCUS cannot rule out HGSIL   . Cancer (CMS HCC)     non small cell lung cancer (R)-s/p Chemo/Radiation completed 8/18   . Colon polyp    . Dysphagia    . Hypothyroid    . Non-small cell lung cancer, right (CMS [facility])     s/p Chemo completed 9/18   . Raynaud's disease    . STD (sexually transmitted disease)     HSV   . Unspecified disorder of thyroid     Hypothyroidism         Past Surgical History:   Procedure Laterality Date   . COLONOSCOPY N/A 03/08/2014    Performed by Duayne Cal, DO at [facility] ([facility])   . GASTROSCOPY WITH BIOPSY N/A 03/30/2017    Performed by Duayne Cal, DO at [facility] 3 OPERATING ROOM   . HX  CATARACT REMOVAL Bilateral 2011   . HX COLONOSCOPY  2009   . HX ELBOW SURGERY     . HX FOOT SURGERY     . HX TUBAL LIGATION  1978   . LUNG BIOPSY Right 10/02/2016   . MEDIASTINOSCOPY  11/13/2016   . PB FOREARM/WRIST SURGERY UNLISTED  1999    carpal tunnel release   . PORTACATH PLACEMENT  12/12/2016     Conj Estrog-Medroxyprogest Ace (PREMPRO) 0.3-1.5 mg Oral Tablet, TAKE ONE TABLET BY MOUTH EVERY DAY  levothyroxine (SYNTHROID) 75 mcg Oral Tablet, Take 75 mcg by mouth Every morning  loratadine (CLARITIN) 10 mg Oral Tablet, take 10 mg by mouth Once a day.  Nifedipine (ADALAT CC) 30 mg Oral Tablet Sustained Release, take 30 mg by mouth Once a day.  OMEGA-3 FATTY ACIDS (FISH OIL ORAL), take  by mouth Once a day.  cycloSPORINE (RESTASIS) 0.05 % Ophthalmic Dropperette, Instill 1 Drop into both eyes Every 12 hours    No facility-administered medications prior to visit.    Review of Systems - negative to review except HPI  Family Medical History:     Problem Relation (Age of Onset)  Breast Cancer Mother    Diabetes Paternal Grandfather, Brother    No Known Problems Father, Sister, Maternal Grandmother, Maternal Grandfather, Paternal 31, Daughter, Son, Maternal Aunt, Maternal Uncle, Paternal 65, Paternal Uncle, Other            Social History     Socioeconomic History   . Marital status: Married     Spouse name: Not on file   . Number of children: Not on file   . Years of education: Not on file   . Highest education level: Not on file   Occupational History     Employer: [employer]   Tobacco Use   . Smoking status: Former Smoker     Years: 20.00     Types: Cigarettes     Start date: 12/09/1970     Quit date: 09/08/1998     Years since quitting: 20.9   . Smokeless tobacco: Never Used   Substance and Sexual Activity   . Alcohol use: No   . Drug use: No   . Sexual activity: Yes     Partners: Male     Birth control/protection: None   Other Topics Concern   . Breast Self Exam Yes   . Calcium intake adequate No      . Ability to Walk 1 Flight of Steps without SOB/CP Yes   . Ability To Do Own ADL's Yes     Social Determinants of Health     Financial Resource Strain:    . Difficulty of Paying Living Expenses:    Food Insecurity:    . Worried About Charity fundraiser in the Last Year:    . Arboriculturist in the Last Year:    Transportation Needs:    . Film/video editor (Medical):    Marland Kitchen Lack of Transportation (Non-Medical):    Physical Activity:    . Days of Exercise per Week:    . Minutes of Exercise per Session:    Stress:    . Feeling of Stress :    Intimate Partner Violence:    . Fear of Current or Ex-Partner:    . Emotionally Abused:    Marland Kitchen Physically Abused:    . Sexually Abused:        Objective:     Vitals:    08/19/19 1118   BP: 122/68   Pulse: 88   Temp: 36.4 C (97.5 F)   TempSrc: Thermal Scan   Weight: 51.3 kg (113 lb)   Height: 1.549 m (5\' 1" )   BMI: 21.4         General:  Alert, cooperative, no distress, appears stated age.   Back:   Symmetric, no curvature. ROM normal. No CVA tenderness.   Lungs:   Clear to auscultation bilaterally.   Heart:  Regular rate and rhythm, S1, S2 normal, no murmur, click, rub or gallop.   Breast Exam:  No tenderness,or nipple abnormality. No adenopathy.no masses. No skin changes   Abdomen:   Soft, non-tender. Bowel sounds normal. No masses,  No organomegaly.   Genitalia:  Normal VBUS atrophy seen. No adenopathy. No leak of urine with valsalva.    no prolapse,vaginal walls normal 2 vaginal wall polyps noted unchanged from last exam., cervix visually normal. Uterus anteverted,normal sized,no adenexal masses         Assessment/plan   Annual Female well visit.    Diagnosis explained in detail, including differential.  All questions answered.  Neurosurgeon distributed.  Discussed healthy lifestyle modifications.  Plan: Health maintenance topics   a. Osteoporosis prevention: discussed vit D/calcium,  b. Obesity Nutrition, exercise and weight management:   Regular exercise:  discussed   Healthy diet: discussed   Weight management: discussed   c. Tobacco cessation: n/a   d. Cancer screening:   Breast cancer screening: performs SBE   Mammograms: ordered  Cervical cancer screening: PAP not indicated  Colorectal cancer screening: per PCP  e. Lab testing: cholesterol, DM screening and TSH per PCP      Unchanged vaginal wall polyps.  Follow up PRN or annual

## 2019-08-25 ENCOUNTER — Ambulatory Visit (VACCINATION_CLINIC): Payer: Self-pay

## 2019-09-15 ENCOUNTER — Ambulatory Visit (VACCINATION_CLINIC): Payer: Self-pay

## 2019-09-20 ENCOUNTER — Ambulatory Visit: Payer: 59 | Attending: Hematology & Oncology

## 2019-09-20 ENCOUNTER — Other Ambulatory Visit: Payer: Self-pay

## 2019-09-20 DIAGNOSIS — I519 Heart disease, unspecified: Secondary | ICD-10-CM | POA: Insufficient documentation

## 2019-09-20 DIAGNOSIS — C3491 Malignant neoplasm of unspecified part of right bronchus or lung: Secondary | ICD-10-CM | POA: Insufficient documentation

## 2019-09-20 DIAGNOSIS — E039 Hypothyroidism, unspecified: Secondary | ICD-10-CM | POA: Insufficient documentation

## 2019-09-20 LAB — BUN: BUN: 10 mg/dL (ref 6–20)

## 2019-09-20 LAB — CREATININE WITH EGFR
CREATININE: 0.69 mg/dL (ref 0.44–1.00)
ESTIMATED GFR: >60 mL/min/{1.73_m2} (ref >60)

## 2019-09-20 LAB — THYROID STIMULATING HORMONE (SENSITIVE TSH): TSH: 4.512 u[IU]/mL (ref 0.340–5.330)

## 2019-09-26 ENCOUNTER — Inpatient Hospital Stay (HOSPITAL_COMMUNITY): Admit: 2019-09-26 | Discharge: 2019-09-26 | Disposition: A | Discharge status: Home / Self Care | Payer: Self-pay

## 2019-09-28 ENCOUNTER — Ambulatory Visit (INDEPENDENT_AMBULATORY_CARE_PROVIDER_SITE_OTHER): Payer: Commercial Managed Care - POS | Admitting: Family

## 2019-09-30 ENCOUNTER — Encounter (HOSPITAL_COMMUNITY): Payer: 59 | Admitting: Hematology & Oncology

## 2019-10-03 ENCOUNTER — Other Ambulatory Visit (HOSPITAL_BASED_OUTPATIENT_CLINIC_OR_DEPARTMENT_OTHER): Payer: Self-pay

## 2019-10-07 ENCOUNTER — Other Ambulatory Visit: Payer: Self-pay

## 2019-10-07 ENCOUNTER — Encounter (HOSPITAL_COMMUNITY): Payer: Self-pay | Admitting: Hematology & Oncology

## 2019-10-07 ENCOUNTER — Ambulatory Visit: Payer: 59 | Attending: Hematology & Oncology | Admitting: Hematology & Oncology

## 2019-10-07 VITALS — BP 123/67 | HR 117 | Temp 99.6°F | Resp 24 | Ht 60.98 in | Wt 112.8 lb

## 2019-10-07 DIAGNOSIS — Z87891 Personal history of nicotine dependence: Secondary | ICD-10-CM | POA: Insufficient documentation

## 2019-10-07 DIAGNOSIS — J438 Other emphysema: Secondary | ICD-10-CM | POA: Insufficient documentation

## 2019-10-07 DIAGNOSIS — J984 Other disorders of lung: Secondary | ICD-10-CM | POA: Insufficient documentation

## 2019-10-07 DIAGNOSIS — R911 Solitary pulmonary nodule: Secondary | ICD-10-CM

## 2019-10-07 DIAGNOSIS — C3491 Malignant neoplasm of unspecified part of right bronchus or lung: Secondary | ICD-10-CM | POA: Insufficient documentation

## 2019-10-07 NOTE — Progress Notes (Signed)
Return Patient Progress Note    Date: 10/07/2019    Name: Gina Barrett  MRN: [id number]  DOB: [birth year]   Referring Physician: Self, Referral  Primary Care Provider: Lanier Ensign, FNP-C    Reason for visit/consultation Lung Cancer (follow-up NSCLC)      Interval History:   65 y.o woman with RUL NSCLC diagnosed and treated 3 years ago who returns to clinic for f/u.  [facility] and brought imaging to be scanned in.  She has some fatigue but other doing well without new respiratory symptoms.  ROS as below.    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  -PET-CT scan on 09/06/2016 showed 10 mm right upper lobe FDG avid lesion that is slightly larger than 2017 CT and concerning for malignancy.  A separate 3 cm long area of tracer uptake in the left lower lobe was noted.  -CT-guided biopsy a [facility] on 10/02/2016 was positive for adenocarcinoma that is well to moderately differentiated.  -Mediastinoscopy 11/13/2016 positive for left mid paratrachael    Stage: IIIB     Molecular/special studies:  unknown    Treatment & Surveillance:   1. S/p chemoradiation with carboplatin + paclitaxel + RT 66.6 Gy completed 02/10/19/18    2. Serial imaging since completion of therapy has been negative for recurrence.  Most recent imaging in on 12/03/2018 was also negative but showed a new T6 compression abnormality.     3. Chest CT with contrast from 09/23/2019 which in summary showed increasing pleural based density in the LLL which may be infectious or inflammatory.  There  is increased 1.7cm LN in left hilum ans stable compression abnormality of T6.    4. Biopsy of left LL pleural bases mass.      ________________________________________________________________________________________________________________________________________________     ROS:     Review of Systems   Constitutional: Positive for fatigue.   All other systems reviewed and are negative.       Objective   Objective:   BP 123/67 (Site: Left, Patient Position: Sitting)    Pulse (!) 117    Temp 37.6 C (99.6 F) (Thermal Scan)    Resp (!) 24    Ht 1.549 m (5' 0.98")    Wt 51.2 kg (112 lb 12.8 oz)    SpO2 99% Comment: ra   BMI 21.32 kg/m       ECOG Status: 1 - Restricted in physically strenuous activity, but ambulatory and able to carry out work of a light or sedentary nature, e.g., light housework, office work.  Physical Exam  Vitals and nursing note reviewed.   Constitutional:       General: She is not in acute distress.  Neck:      Comments: No palpable superficial axillary lymph nodes  Cardiovascular:      Rate and Rhythm: Normal rate and regular rhythm.      Heart sounds: Normal heart sounds. No murmur heard.   No friction rub. No gallop.    Pulmonary:      Effort: Pulmonary effort is normal. No respiratory distress.      Breath sounds: Normal breath sounds.   Abdominal:  Palpations: Abdomen is soft. There is no hepatomegaly, splenomegaly or mass.      Tenderness: There is no abdominal tenderness. There is no guarding.   Lymphadenopathy:      Head:      Right side of head: No submental or submandibular adenopathy.      Left side of head: No submental or submandibular adenopathy.      Cervical: No cervical adenopathy.      Upper Body:      Right upper body: No supraclavicular adenopathy.      Left upper body: No supraclavicular adenopathy.   Skin:     General: Skin is warm and dry.      Coloration: Skin is not pale.      Findings: No rash.   Neurological:      Mental Status: She is alert and oriented to person,  place, and time.      Gait: Gait is intact.   Psychiatric:         Mood and Affect: Mood and affect normal.          Past Medical History:   Diagnosis Date    Abnormal Pap smear     ASCUS, ASCUS cannot rule out HGSIL    Cancer (CMS HCC)     non small cell lung cancer (R)-s/p Chemo/Radiation completed 8/18    Colon polyp     Dysphagia     Hypothyroid     Non-small cell lung cancer, right (CMS HCC)     s/p Chemo completed 9/18    Raynaud's disease     STD (sexually transmitted disease)     HSV    Unspecified disorder of thyroid     Hypothyroidism         Current Outpatient Medications   Medication Sig    carboxymethyl/glycerin/poly80 (REFRESH OPTIVE ADVANCED OPHT) Apply to eye Once per day as needed    carboxymethylcellulose Sodium (THERATEARS) 0.25 % Ophthalmic Drops 1-2 Drops Once per day as needed    Conj Estrog-Medroxyprogest Ace (PREMPRO) 0.3-1.5 mg Oral Tablet Take 1 Tablet by mouth Once a day    levothyroxine (SYNTHROID) 75 mcg Oral Tablet Take 75 mcg by mouth Every morning    loratadine (CLARITIN) 10 mg Oral Tablet take 10 mg by mouth Once a day.    MULTIVITAMIN ORAL Take by mouth Once a day Centrum Silver Women's 50+    Nifedipine (ADALAT CC) 30 mg Oral Tablet Sustained Release take 30 mg by mouth Once a day.    OMEGA-3 FATTY ACIDS (FISH OIL ORAL) take  by mouth Once a day.    raloxifene (EVISTA) 60 mg Oral Tablet Take 60 mg by mouth Once a day    UNKNOWN MEDICATION (UNKNOWN MEDICATION) Apply to eye [name] 128 Eye Drops OTC 4x daily  [name] 128 Eye Gel OTC Nightly     Allergies as of 10/07/2019 - Reviewed 10/07/2019   Allergen Reaction Noted    Penicillins  08/15/2010       Social History  Occupation:   Social History     Occupational History     Employer: [employer] [id number]   reports that she quit smoking about 21 years ago. Her smoking use included cigarettes. She started smoking about 48 years ago. She quit after 20.00 years of use. She has never used  smokeless tobacco. She reports being sexually active and has had partner(s) who are Female. She reports using the following method of  birth control/protection: None. She reports that she does not drink alcohol and does not use drugs.    Labs:  Labs reviewed and interpreted:  No results found for this or any previous visit (from the past 96 hour(s)).    Radiology: All recent pertinent radiologic images and reports reviewed      ________________________________________________________________________________________________________________________________________________     Assessment/Plan:     1. Non-small cell lung cancer, right (CMS HCC)  -Doing clinically  -I reviewed both the report and imaging of recent chest CT with contrast from 09/23/2019 which in summary showed increasing pleural based density in the LLL which may be infectious or inflammatory.  There is increased 1.7cm LN in left hilum ans stable compression abnormality of T6.  -Biopsy of pleural based mass which I don't believe is infectious or inflammatory.  If biopsy negative, wll ask for pet/ct to fully evaluate recurrence to include 1.7cm left hilar LN and T6 compression abnormality..  - IR CT GUIDED BIOPSY; Future      2. Lesion of left lung  -Per #1 as above.  - IR CT GUIDED BIOPSY; Future    3. Other emphysema (CMS HCC)  - Refer to [facility]; Future      Return in about 6 weeks (around 11/18/2019) for In Person Visit.    Mertie Moores, MD        CC:  PCP General:  Lanier Ensign, FNP-C  [facility] [id number]  [place] [address]      Portions of this note may be dictated using voice recognition software or a dictation service. Variances in spelling and vocabulary are possible and unintentional. Not all errors are caught/corrected. Please notify the [facility] if any discrepancies are noted or if the meaning of any statement is not clear.

## 2019-10-09 ENCOUNTER — Encounter (HOSPITAL_COMMUNITY): Payer: Self-pay | Admitting: Hematology & Oncology

## 2019-10-13 ENCOUNTER — Ambulatory Visit (INDEPENDENT_AMBULATORY_CARE_PROVIDER_SITE_OTHER): Payer: Commercial Managed Care - POS | Admitting: Family

## 2019-10-13 ENCOUNTER — Encounter (INDEPENDENT_AMBULATORY_CARE_PROVIDER_SITE_OTHER): Payer: Self-pay | Admitting: Family

## 2019-10-13 ENCOUNTER — Ambulatory Visit: Payer: 59 | Attending: Specialist

## 2019-10-13 ENCOUNTER — Other Ambulatory Visit: Payer: Self-pay

## 2019-10-13 DIAGNOSIS — Z23 Encounter for immunization: Secondary | ICD-10-CM

## 2019-10-13 DIAGNOSIS — M3500 Sicca syndrome, unspecified: Secondary | ICD-10-CM

## 2019-10-13 DIAGNOSIS — E039 Hypothyroidism, unspecified: Secondary | ICD-10-CM

## 2019-10-13 DIAGNOSIS — C3491 Malignant neoplasm of unspecified part of right bronchus or lung: Secondary | ICD-10-CM

## 2019-10-13 DIAGNOSIS — I7300 Raynaud's syndrome without gangrene: Secondary | ICD-10-CM

## 2019-10-13 DIAGNOSIS — M05742 Rheumatoid arthritis with rheumatoid factor of left hand without organ or systems involvement: Secondary | ICD-10-CM

## 2019-10-13 DIAGNOSIS — E782 Mixed hyperlipidemia: Secondary | ICD-10-CM

## 2019-10-13 DIAGNOSIS — M05741 Rheumatoid arthritis with rheumatoid factor of right hand without organ or systems involvement: Secondary | ICD-10-CM

## 2019-10-13 DIAGNOSIS — M059 Rheumatoid arthritis with rheumatoid factor, unspecified: Secondary | ICD-10-CM | POA: Insufficient documentation

## 2019-10-13 LAB — CBC W/AUTO DIFF
BASOPHIL #: 0 10*3/uL (ref 0.00–0.10)
BASOPHIL %: 1 % (ref 0–3)
EOSINOPHIL #: 0.1 10*3/uL (ref 0.00–0.50)
EOSINOPHIL %: 1 % (ref 0–5)
HCT: 38.3 % (ref 36.0–45.0)
HGB: 13 g/dL (ref 12.0–15.5)
LYMPHOCYTE #: 1.3 10*3/uL (ref 1.00–4.80)
LYMPHOCYTE %: 29 % (ref 15–43)
MCH: 30.4 pg (ref 27.5–33.2)
MCHC: 33.9 g/dL (ref 32.0–36.0)
MCV: 89.7 fL (ref 82.0–97.0)
MONOCYTE #: 0.7 10*3/uL (ref 0.20–0.90)
MONOCYTE %: 16 % — ABNORMAL HIGH (ref 5–12)
MPV: 9.4 fL (ref 7.4–10.5)
NEUTROPHIL #: 2.3 10*3/uL (ref 1.50–6.50)
NEUTROPHIL %: 53 % (ref 43–76)
PLATELETS: 271 10*3/uL (ref 150–450)
RBC: 4.27 10*6/uL (ref 4.00–5.10)
RDW: 14.4 % (ref 11.0–16.0)
WBC: 4.4 10*3/uL (ref 4.0–11.0)

## 2019-10-13 LAB — HEPATIC FUNCTION PANEL
ALBUMIN: 3.4 g/dL (ref 3.4–4.8)
ALKALINE PHOSPHATASE: 56 U/L (ref 50–130)
ALT (SGPT): 12 U/L (ref 8–22)
AST (SGOT): 23 U/L (ref 8–45)
BILIRUBIN DIRECT: 0.1 mg/dL (ref 0.1–0.4)
BILIRUBIN TOTAL: 0.4 mg/dL (ref 0.3–1.3)
PROTEIN TOTAL: 7.7 g/dL (ref 6.0–8.0)

## 2019-10-13 LAB — BUN: BUN: 11 mg/dL (ref 8–25)

## 2019-10-13 LAB — HEPATITIS C ANTIBODY SCREEN WITH REFLEX TO HCV PCR: HCV ANTIBODY QUALITATIVE: REACTIVE — AB

## 2019-10-13 LAB — SEDIMENTATION RATE: ERYTHROCYTE SEDIMENTATION RATE (ESR): 40 mm/h — ABNORMAL HIGH (ref 0–30)

## 2019-10-13 LAB — RHEUMATOID FACTOR, SERUM: RHEUMATOID FACTOR: 165 [IU]/mL — ABNORMAL HIGH (ref <=30)

## 2019-10-13 LAB — CREATININE WITH EGFR
CREATININE: 0.77 mg/dL (ref 0.60–1.05)
ESTIMATED GFR: 81 mL/min/BSA (ref >=60)

## 2019-10-13 MED ORDER — LEVOTHYROXINE SODIUM 88 MCG PO TABS
88.00 ug | ORAL_TABLET | Freq: Every day | ORAL | 3 refills | Status: DC
Start: ? — End: 2019-10-13

## 2019-10-13 NOTE — Progress Notes (Signed)
Subjective:    Patient ID: Melody Hendricks is a 64 y.o. female.    Here to follow up on   Moderate mixed hyperlipidemia not requiring statin therapy   Acquired hypothyroidism  Raynaud's disease without gangrene  Sjogren's syndrome without extraglandular involvement  Rheumatoid arthritis involving both hands with positive rheumatoid factor  Non-small cell lung cancer, right    Patient  has not had her cholesterol checked since December 2019, when it was @ goal. Her CV risk was 5.6%, no statin or asa required.    She is taking synthroid 75 MCG daily. Her last TSH was September 18, 2018. At that time her TSh was 4.5 She complains of mild fatigue    Taking adalat 30 mg daily for raynaud's syndrome.  She denies any ulcerations on her fingers or toes.  Patient admits to wearing warm socks and gloves when needed.    Continues to see rheumatology for treatment of her Sjogren's as well as rheumatoid arthritis. She is currently on methotrexate 2.5 mg      Saw oncology on 4/23 for her Non-small cell lung cancer, right (CMS [facility])  She had a chest CT with contrast from 09/23/2019 which in summary showed increasing pleural based density in the LLL which may be infectious or inflammatory. There is increased 1.7cm LN in left hilum ans stable compression abnormality of T6.  Will have IR CT guided biopsy . If biopsy negative, wll ask for pet/ct to fully evaluate recurrence to include 1.7cm left hilar LN and T6 compression abnormality..      The following portions of the patient's history were reviewed and updated as appropriate: allergies, current medications, past family history, past medical history, past social history, past surgical history and problem list.    Review of Systems   Constitutional: Negative for activity change, appetite change, chills, fatigue and fever.   HENT: Negative for sinus pressure and sinus pain.    Eyes: Negative for visual disturbance.   Respiratory: Negative for chest tightness and shortness of breath.     Cardiovascular: Negative for chest pain.   Gastrointestinal: Negative for abdominal pain, constipation, diarrhea, nausea and vomiting.   Endocrine: Negative for polydipsia and polyphagia.   Genitourinary: Negative for difficulty urinating.   Musculoskeletal: Positive for arthralgias and joint swelling.   Skin: Negative for rash.   Neurological: Negative for headaches.   Psychiatric/Behavioral: Negative for suicidal ideas.   All other systems reviewed and are negative.          Objective:    Physical Exam  Vitals signs reviewed.   Constitutional:       Appearance: Normal appearance.   HENT:      Head: Normocephalic and atraumatic.      Right Ear: Tympanic membrane, ear canal and external ear normal.      Left Ear: Tympanic membrane, ear canal and external ear normal.      Nose: Nose normal.      Mouth/Throat:      Mouth: Mucous membranes are moist.      Pharynx: Oropharynx is clear.   Eyes:      Extraocular Movements: Extraocular movements intact.      Conjunctiva/sclera: Conjunctivae normal.      Pupils: Pupils are equal, round, and reactive to light.   Neck:      Musculoskeletal: Normal range of motion and neck supple.   Cardiovascular:      Rate and Rhythm: Normal rate and regular rhythm.      Pulses:  Normal pulses.      Heart sounds: Normal heart sounds.   Pulmonary:      Effort: Pulmonary effort is normal. No respiratory distress.      Breath sounds: Normal breath sounds.   Abdominal:      General: Bowel sounds are normal.      Palpations: Abdomen is soft.   Musculoskeletal: Normal range of motion.      Comments: Joint tenderness in both hands    Skin:     General: Skin is warm.      Capillary Refill: Capillary refill takes less than 2 seconds.   Neurological:      General: No focal deficit present.      Mental Status: She is alert and oriented to person, place, and time.   Psychiatric:         Attention and Perception: Attention normal.         Mood and Affect: Mood normal.         Speech: Speech normal.          Behavior: Behavior normal.         Thought Content: Thought content normal.         Cognition and Memory: Cognition normal.         Judgment: Judgment normal.             Assessment:       1. Moderate mixed hyperlipidemia not requiring statin therapy    2. Acquired hypothyroidism    3. Raynaud's disease without gangrene    4. Sjogren's syndrome without extraglandular involvement    5. Rheumatoid arthritis involving both hands with positive rheumatoid factor    6. Non-small cell lung cancer, right    7.  Immunization Due         Plan:       1. Continue to exercise. Get plenty of fiber in diet. Will recheck lipid in 6 months. Low CV risk. No statin required based on last lab work  2. Increase Synthroid to 88 mcg. Recheck labs in 2-3 months.   3. Follow up with Rheumatology as scheduled  4. Follow up with Endocrinology  5. Follow up with Rheumatology as scheduled  6. Follow up with Oncology as scheduled. Encouraged to keep her appointment for biopsy when scheduled  7. Pneumonia 23 vaccine given per Crystal, RN    Follow up in 3 months and prn     Orders Placed This Encounter    Pneumococcal polysaccharide vaccine 23-valent greater than or equal to 2yo subcutaneous/IM    TSH    DISCONTD: Carboxymethylcellulose Sodium (Eye Drops) 0.5 % Solution    DISCONTD: Carboxymethylcellulose Sodium (Theratears) 0.25 % Solution    Multiple Vitamins-Minerals (CENTRUM SILVER PO)    ketotifen (ZADITOR) 0.025 % ophthalmic solution    Carboxymethylcellul-Glycerin (REFRESH OPTIVE OP)    levothyroxine (Synthroid) 88 MCG tablet

## 2019-10-14 ENCOUNTER — Other Ambulatory Visit (HOSPITAL_COMMUNITY): Payer: Self-pay | Admitting: Adult Health

## 2019-10-14 DIAGNOSIS — Z01812 Encounter for preprocedural laboratory examination: Secondary | ICD-10-CM

## 2019-10-14 LAB — CYCLIC CITRULLINATED PEPTIDE ANTIBODIES, IGG, SERUM
CYCLIC CITRULLINATED PEPTIDE ANTIBODY IGG QUAL: NEGATIVE
CYCLIC CITRULLINATED PEPTIDE ANTIBODY IGG QUANT: 0.5 U/mL (ref <3.0)

## 2019-10-17 LAB — ANA (ANTINUCLEAR ANTIBODIES), SERUM
ANTI-NUCLEAR ANTIBODIES,QUALITATIVE: POSITIVE — AB
ANTI-NUCLEAR ANTIBODIES,QUANTITATIVE: 9.74 {index_val} — ABNORMAL HIGH (ref <=0.90)

## 2019-10-17 LAB — HEPATITIS C VIRUS (HCV) RNA DETECTION AND QUANTIFICATION, PCR, PLASMA: HCV QUANTITATIVE PCR: NOT DETECTED

## 2019-10-19 LAB — ANA REFLEX
ANA FINAL INTERPRETATION: POSITIVE — AB
ANA TITER: >1:5120 {titer}

## 2019-10-20 LAB — HEPATITIS B VIRUS (HBV) DNA, QUANTITATIVE PCR, PLASMA: HBV QUANTITATIVE PCR: NOT DETECTED

## 2019-10-31 ENCOUNTER — Ambulatory Visit: Payer: 59 | Attending: Adult Health

## 2019-10-31 ENCOUNTER — Telehealth (HOSPITAL_COMMUNITY): Payer: Self-pay

## 2019-10-31 DIAGNOSIS — Z20822 Contact with and (suspected) exposure to covid-19: Secondary | ICD-10-CM | POA: Insufficient documentation

## 2019-10-31 DIAGNOSIS — Z01812 Encounter for preprocedural laboratory examination: Secondary | ICD-10-CM | POA: Insufficient documentation

## 2019-10-31 LAB — COVID-19 ~~LOC~~ MOLECULAR LAB TESTING: SARS-CoV-2: NOT DETECTED

## 2019-10-31 NOTE — Telephone Encounter (Signed)
Pt saw her rheumatologist this past week. He wants to put her on a new medication but asked her to check with her oncologist if there was any reason she shouldn't be taking it.     Medication is hydrochloroquine sulfate 200 mg, BID.     Pt is also asking if she can start it right away or should she wait until after her biopsy on 5/19?    Per Dr. Holley Dexter, ok to start med. Advised pt to check with surgeon as well.    Servando Snare  Triage Nurse

## 2019-11-02 ENCOUNTER — Other Ambulatory Visit: Payer: Self-pay

## 2019-11-02 ENCOUNTER — Inpatient Hospital Stay
Admission: RE | Admit: 2019-11-02 | Discharge: 2019-11-02 | Disposition: A | Discharge status: Home / Self Care | Payer: 59 | Source: Ambulatory Visit | Attending: Hematology & Oncology | Admitting: Hematology & Oncology

## 2019-11-02 DIAGNOSIS — J984 Other disorders of lung: Secondary | ICD-10-CM

## 2019-11-02 DIAGNOSIS — D1432 Benign neoplasm of left bronchus and lung: Secondary | ICD-10-CM

## 2019-11-02 DIAGNOSIS — R911 Solitary pulmonary nodule: Secondary | ICD-10-CM | POA: Insufficient documentation

## 2019-11-02 DIAGNOSIS — C3491 Malignant neoplasm of unspecified part of right bronchus or lung: Secondary | ICD-10-CM | POA: Insufficient documentation

## 2019-11-02 MED ORDER — LIDOCAINE 1 %-EPINEPHRINE 1:100,000 INJECTION SOLUTION
Freq: Once | INTRAMUSCULAR | Status: AC | PRN
Start: 2019-11-02 — End: 2019-11-02
  Administered 2019-11-02: 10 mL via INTRADERMAL

## 2019-11-02 MED ORDER — FENTANYL (PF) 50 MCG/ML INJECTION SOLUTION
INTRAMUSCULAR | Status: AC
Start: 2019-11-02 — End: 2019-11-02
  Filled 2019-11-02: qty 2

## 2019-11-02 MED ORDER — FENTANYL (PF) 50 MCG/ML INJECTION SOLUTION
Freq: Once | INTRAMUSCULAR | Status: AC | PRN
Start: 2019-11-02 — End: 2019-11-02
  Administered 2019-11-02: 50 ug via INTRAVENOUS

## 2019-11-03 LAB — SURGICAL PATHOLOGY SPECIMEN

## 2019-11-04 ENCOUNTER — Encounter (HOSPITAL_BASED_OUTPATIENT_CLINIC_OR_DEPARTMENT_OTHER): Payer: Self-pay

## 2019-11-04 ENCOUNTER — Ambulatory Visit
Admission: RE | Admit: 2019-11-04 | Discharge: 2019-11-04 | Disposition: A | Discharge status: Home / Self Care | Payer: 59 | Source: Ambulatory Visit | Attending: Obstetrics & Gynecology | Admitting: Obstetrics & Gynecology

## 2019-11-04 ENCOUNTER — Other Ambulatory Visit: Payer: Self-pay

## 2019-11-04 DIAGNOSIS — Z1239 Encounter for other screening for malignant neoplasm of breast: Secondary | ICD-10-CM | POA: Insufficient documentation

## 2019-11-04 NOTE — Progress Notes (Signed)
Dear [name]    You recent mammogram did not show any mammographic evidence of malignancy or concern. Please continue with periodic breast exams and return to the clinic for your annual to have another mammogram done in one year.    Please call us if you have any questions.      Glendora Score MD, FACOG  Associate Professor  Dept. Of Ob/GYN  [facility].

## 2019-11-12 ENCOUNTER — Ambulatory Visit (INDEPENDENT_AMBULATORY_CARE_PROVIDER_SITE_OTHER): Payer: Commercial Managed Care - POS | Admitting: Physician Assistant

## 2019-11-12 ENCOUNTER — Encounter (INDEPENDENT_AMBULATORY_CARE_PROVIDER_SITE_OTHER): Payer: Self-pay

## 2019-11-12 VITALS — BP 149/74 | HR 120 | Temp 98.3°F | Resp 18 | Wt 113.0 lb

## 2019-11-12 DIAGNOSIS — R000 Tachycardia, unspecified: Secondary | ICD-10-CM

## 2019-11-12 DIAGNOSIS — M62838 Other muscle spasm: Secondary | ICD-10-CM

## 2019-11-12 MED ORDER — METAXALONE 800 MG PO TABS
800.0000 mg | ORAL_TABLET | Freq: Three times a day (TID) | ORAL | 0 refills | Status: DC
Start: 2019-11-12 — End: 2020-01-12

## 2019-11-12 NOTE — Patient Instructions (Addendum)
Muscle Spasm    Apply ice to the affected area 20 minutes every 2-4 hours for 1-2 days then use warm moist heat. Rest and elevate your affected extremity. Take Ibuprofen over the counter 400-600 mg (2-3 tabs) every 6 hours for pain and swelling.     Go to the Emergency Department if symptoms worsen or change in any way.   A muscle spasm is a sudden tightening of the muscle you can't control. This may be caused by strain, overworking the muscle, or injury. It can also be caused bydehydration, electrolyte imbalance, diabetes, alcohol use, and certain medicines. If it goes on long enough the muscle spasm causes pain. Common areas for muscle spasm are the legs, neck, and back.  Home care   Heat, massage, and stretching will help relax muscle spasm.   When the spasm is in your arm or leg, stretch the musclepassively. To do this, have someone bend or straighten the joint above or below the muscle until you feel the stretch on the sore muscle.You can stretch the muscleactivelyby moving the affected body part. This will stretch the muscle that is in spasm. For example, if the spasm is in your calf, bend the ankle so your toespointupward toward your knee. This will stretch your calf muscle.   You may useover-the-counter pain medicine to control pain, unless another medicine was prescribed. If you have chronic liver or kidney disease or ever had a stomach ulcer or gastrointestinal bleeding, talk with your healthcare provider beforeusing these medicines.    Follow-up care  Follow up with yourhealthcare provider, or as advised.   When to seek medical advice  Call your healthcare provider right away if any of the following occur:   Fingers or toes become swollen, cold, blue, numb, or tingly   You develop weakness in the affected arm or leg   Pain increases and is not controlled by the above measures  [employer] last reviewed this educational content on 10/14/2016   2000-2020 [employer], [place]. [address], [place], [place] [address]. All rights reserved. This information is not intended as a substitute for professional medical care. Always follow your healthcare professional's instructions.

## 2019-11-12 NOTE — Progress Notes (Signed)
Subjective:    Patient ID: Melody Hendricks is a 64 y.o. female.    This patient was seen in clinic due to low risk of suspected COVID 19. The patient wore a loop mask during the history and physical exam. I wore a loop mask and the protective eye wear provided for me for the duration of the patient's visit to minimize my risk of exposure.     HPI  Patient presents today due to pain to her left upper back.  She states this started last evening.  She states she has pain with movement of her head and movement of her left arm.  Patient denies any injury.  She has not been lifting anything.  Patient has not tried to take anything for symptomatic relief.  Patient states that she did have a lung biopsy 10 days ago on her left lower lung.    The following portions of the patient's history were reviewed and updated as appropriate: allergies, current medications, past family history, past medical history, past social history, past surgical history and problem list.    Review of Systems   Constitutional: Negative for chills, fatigue and fever.   HENT: Negative for congestion and sore throat.    Respiratory: Negative for cough.    Gastrointestinal: Negative for abdominal pain, nausea and vomiting.   Musculoskeletal: Positive for back pain and myalgias.   Skin: Negative for rash.   Allergic/Immunologic: Negative for environmental allergies.   Neurological: Negative for dizziness, light-headedness and headaches.   Hematological: Negative for adenopathy.   Psychiatric/Behavioral: The patient is not nervous/anxious.          Objective:    BP 149/74   Pulse (!) 120   Temp 98.3 F (36.8 C) (Tympanic)   Resp 18   Wt 51.3 kg (113 lb)   SpO2 100%   BMI 21.35 kg/m     Physical Exam  Vitals and nursing note reviewed.   Constitutional:       Appearance: Normal appearance.   HENT:      Head: Normocephalic.   Cardiovascular:      Rate and Rhythm: Normal rate and regular rhythm.      Heart sounds: Normal heart sounds.    Pulmonary:      Effort: Pulmonary effort is normal.      Breath sounds: Normal breath sounds.   Musculoskeletal:        Arms:    Skin:     General: Skin is warm.   Neurological:      General: No focal deficit present.      Mental Status: She is alert and oriented to person, place, and time.           Assessment and Plan:       Melody Hendricks was seen today for shoulder problem.    Diagnoses and all orders for this visit:    Trapezius muscle spasm    Tachycardia    Other orders  -     metaxalone (Skelaxin) 800 MG tablet; Take 1 tablet (800 mg total) by mouth 3 (three) times daily PRN muscle spasm; DO NOT drive    Patient was instructed this most likely is muscular as there is pain with movement of her head and arm.  We discussed the fact that there would be concern for blood clot due to her recent lung biopsy.  Patient's heart rate was elevated 120 bpm when she came in.  On recheck it was 109 bpm.  Patient has  good lung sounds throughout.  Patient was instructed if any symptoms were to worsen or change she must get emergency department.  Patient was instructed to apply ice or warm moist heat 20 minutes every 2-4 hours and to take ibuprofen 400 to 600 mg every 6 hours.  Patient agrees and is understanding.          Braulio Conte, [place]  [facility]  11/12/2019  2:32 PM

## 2019-11-18 ENCOUNTER — Encounter (HOSPITAL_COMMUNITY): Payer: Self-pay | Admitting: Hematology & Oncology

## 2019-11-18 ENCOUNTER — Ambulatory Visit: Payer: 59 | Attending: Hematology & Oncology | Admitting: Hematology & Oncology

## 2019-11-18 ENCOUNTER — Other Ambulatory Visit: Payer: Self-pay

## 2019-11-18 VITALS — BP 113/68 | HR 111 | Temp 97.3°F | Resp 26 | Ht 60.98 in | Wt 111.0 lb

## 2019-11-18 DIAGNOSIS — Z87891 Personal history of nicotine dependence: Secondary | ICD-10-CM

## 2019-11-18 DIAGNOSIS — C3491 Malignant neoplasm of unspecified part of right bronchus or lung: Secondary | ICD-10-CM

## 2019-11-18 DIAGNOSIS — J984 Other disorders of lung: Secondary | ICD-10-CM | POA: Insufficient documentation

## 2019-11-18 DIAGNOSIS — R911 Solitary pulmonary nodule: Secondary | ICD-10-CM

## 2019-11-18 DIAGNOSIS — Z85118 Personal history of other malignant neoplasm of bronchus and lung: Secondary | ICD-10-CM | POA: Insufficient documentation

## 2019-11-18 NOTE — Progress Notes (Signed)
Return Patient Progress Note    Date: 11/18/2019    Name: Gina Barrett  MRN: [id number]  DOB: 09-18-1955   Referring Physician: Self, Referral  Primary Care Provider: Lanier Ensign, FNP-C    Reason for visit/consultation Lung Cancer (NSCLC - right)      Interval History:   64 y/o woman with stage IIIB lung cancer diagnosed 3 years ago who on recent imaging surveillance had concerning pet/ct finding for recurrence.  she had CT guided biopsy on 11/02/2019 and returns to discuss result and management.  Has no complication from biopsy and no new symptoms.    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  -PET-CT scan on 09/06/2016 showed 10 mm right upper lobe FDG avid lesion that is slightly larger than 2017 CT and concerning for malignancy.  A separate 3 cm long area of tracer uptake in the left lower lobe was noted.  -CT-guided biopsy a [facility] on 10/02/2016 was positive for adenocarcinoma that is well to moderately differentiated.  -Mediastinoscopy 11/13/2016 positive for left mid paratrachael    Stage: IIIB     Molecular/special studies:  unknown    Treatment & Surveillance:   1. S/p chemoradiation with carboplatin + paclitaxel + RT 66.6 Gy completed 02/10/19/18    2. Serial imaging since completion of therapy has been negative for recurrence.  Most recent imaging in on 12/03/2018 was also negative but showed a new T6 compression abnormality.     3. Chest CT with contrast from 09/23/2019 which in summary showed increasing pleural based density in the LLL which may be infectious or inflammatory.   There is increased 1.7cm LN in left hilum ans stable compression abnormality of T6.    4. Biopsy of left LL pleural bases mass on 11/02/2019 showed benign fibrovascular tissue with focal chronic inflammation.  no lung parechyma or malgnancy identified.      ________________________________________________________________________________________________________________________________________________     ROS:     Review of Systems   Eyes: Positive for eye problems.   Musculoskeletal: Positive for neck pain.   All other systems reviewed and are negative.       Objective   Objective:   BP 113/68 (Site: Left, Patient Position: Sitting)    Pulse (!) 111    Temp 36.3 C (97.3 F) (Thermal Scan)    Resp (!) 26    Ht 1.549 m (5' 0.98") Comment: *   Wt 50.3 kg (111 lb)    BMI 20.98 kg/m       ECOG Status: 0 - Fully active, able to carry on all pre-disease performance without restriction.     Physical Exam  Vitals and nursing note reviewed.   Constitutional:       General: She is not in acute distress.  Neck:      Comments: No palpable superficial axillary lymph nodes  Cardiovascular:      Rate and Rhythm: Normal rate and regular rhythm.      Heart sounds: Normal heart sounds. No murmur heard.   No friction rub. No gallop.    Pulmonary:      Effort: Pulmonary effort is normal. No respiratory distress.      Breath sounds: Normal breath sounds. No  wheezing or rhonchi.   Abdominal:      Palpations: Abdomen is soft. There is no mass.      Tenderness: There is no abdominal tenderness. There is no guarding.   Lymphadenopathy:      Cervical: No cervical adenopathy.      Upper Body:      Right upper body: No supraclavicular adenopathy.      Left upper body: No supraclavicular adenopathy.   Skin:     Coloration: Skin is not pale.      Findings: No rash.   Neurological:      Mental Status: She is alert and oriented to person, place, and time.      Gait: Gait is intact.   Psychiatric:         Mood and Affect: Mood and affect normal.               Past Medical History:   Diagnosis Date    Abnormal Pap smear     ASCUS, ASCUS cannot rule out HGSIL    Cancer (CMS HCC)     non small cell lung cancer (R)-s/p Chemo/Radiation completed 8/18    Colon polyp     Dysphagia     Hypothyroid     Non-small cell lung cancer, right (CMS HCC)     s/p Chemo completed 9/18    Raynaud's disease     STD (sexually transmitted disease)     HSV    Unspecified disorder of thyroid     Hypothyroidism         Current Outpatient Medications   Medication Sig    carboxymethyl/glycerin/poly80 (REFRESH OPTIVE ADVANCED OPHT) Apply to eye Once per day as needed    carboxymethylcellulose Sodium (THERATEARS) 0.25 % Ophthalmic Drops 1-2 Drops Once per day as needed    hydrOXYchloroQUINE (PLAQUENIL) 200 mg Oral Tablet TAKE 2 TABLETS BY MOUTH EVERY DAY    levothyroxine (SYNTHROID) 88 mcg Oral Tablet TAKE ONE TABLET BY MOUTH EVERY DAY    loratadine (CLARITIN) 10 mg Oral Tablet take 10 mg by mouth Once a day.    metaxalone (SKELAXIN) 800 mg Oral Tablet TAKE 1 TABLET BY MOUTH 3 TIMES DAILY AS NEEDED FOR MUSCLE SPASM. DO NOT DRIVE    MULTIVITAMIN ORAL Take by mouth Once a day Centrum Silver Women's 50+    Nifedipine (ADALAT CC) 30 mg Oral Tablet Sustained Release take 30 mg by mouth Once a day.    OMEGA-3 FATTY ACIDS (FISH OIL ORAL) take  by mouth Once a day.    raloxifene (EVISTA) 60 mg Oral Tablet Take 60 mg by mouth Once a day    UNKNOWN MEDICATION (UNKNOWN MEDICATION) Apply to eye [name] 128 Eye Drops OTC 4x daily  [name] 128 Eye Gel OTC Nightly     Allergies as of 11/18/2019 - Reviewed 11/18/2019   Allergen Reaction Noted    Penicillins  08/15/2010       Social History  Occupation:   Social History     Occupational History     Employer: [employer] [place] [id number]   reports that she quit smoking about 21 years ago. Her smoking use included cigarettes. She started smoking about 49 years ago. She quit after 20.00 years of use. She has never used  smokeless tobacco. She reports being sexually active and has had partner(s) who are Female. She reports using the following method of birth control/protection: None. She reports that she does not drink alcohol  and does not use drugs.    Labs:  Labs reviewed and interpreted:  SURGICAL PATHOLOGY SPECIMEN: [phone]  Order: [id number]  Status:  Final result Visible to patient:  Yes ([facility]) Next appt:  05/18/2020 at 10:30 AM in Hematology & Oncology Mertie Moores, MD)   0 Result Notes  Component    Final Diagnosis   LUNG, LEFT, NEEDLE CORE BIOPSY:  -          Benign fibrovascular tissue with focal chronic inflammation.  -          No lung parenchyma or malignancy identified.                Radiology: All recent pertinent radiologic images and reports reviewed      ________________________________________________________________________________________________________________________________________________     Assessment/Plan:     1. Non-small cell lung cancer, right (CMS [place])  Remains in remission for 3 years now with recent biopsy negative for recurrence.  Pathology reviewed with patient.  Continue surveillance.    2. Lesion of left lung  Per #1 above      Return in about 6 months (around 05/19/2020).    Mertie Moores, MD        CC:  PCP General:  Lanier Ensign, FNP-C  [facility] [address]  [place] [address]      Portions of this note may be dictated using voice recognition software or a dictation service. Variances in spelling and vocabulary are possible and unintentional. Not all errors are caught/corrected. Please notify the [facility] if any discrepancies are noted or if the meaning of any statement is not clear.

## 2019-12-01 ENCOUNTER — Encounter (HOSPITAL_COMMUNITY): Payer: Self-pay | Admitting: Hematology & Oncology

## 2019-12-09 ENCOUNTER — Encounter (INDEPENDENT_AMBULATORY_CARE_PROVIDER_SITE_OTHER): Payer: Self-pay

## 2020-01-05 ENCOUNTER — Other Ambulatory Visit: Payer: Self-pay

## 2020-01-05 ENCOUNTER — Ambulatory Visit: Payer: 59 | Attending: NURSE PRACTITIONER

## 2020-01-05 DIAGNOSIS — E039 Hypothyroidism, unspecified: Secondary | ICD-10-CM | POA: Insufficient documentation

## 2020-01-05 DIAGNOSIS — I519 Heart disease, unspecified: Secondary | ICD-10-CM | POA: Insufficient documentation

## 2020-01-05 LAB — THYROID STIMULATING HORMONE (SENSITIVE TSH): TSH: 1.614 u[IU]/mL (ref 0.430–3.550)

## 2020-01-12 ENCOUNTER — Ambulatory Visit (INDEPENDENT_AMBULATORY_CARE_PROVIDER_SITE_OTHER): Payer: Commercial Managed Care - POS | Admitting: Family

## 2020-01-12 ENCOUNTER — Encounter (INDEPENDENT_AMBULATORY_CARE_PROVIDER_SITE_OTHER): Payer: Self-pay | Admitting: Family

## 2020-01-12 VITALS — BP 114/70 | HR 115 | Temp 98.8°F | Resp 16 | Ht 61.0 in | Wt 110.0 lb

## 2020-01-12 DIAGNOSIS — E039 Hypothyroidism, unspecified: Secondary | ICD-10-CM

## 2020-01-12 DIAGNOSIS — R000 Tachycardia, unspecified: Secondary | ICD-10-CM

## 2020-01-12 NOTE — Progress Notes (Signed)
Subjective:    Patient ID: Melody Hendricks is a 64 y.o. female.    Patient here to follow-up on hypothyroidism.    Patient's Synthroid was increased from 75 mcg to 88 mcg approximately 2 months ago due to her TSH being elevated at 4.  5 her TSH is now improved at 1.6.    Patient was noted to have a heart rate of 115 today on exam.  She denies any chest pain, shortness of breath, heart palpitations, or vertigo.  She states that she has not noticed any heart palpitations.  She states she has had some mild fatigue but thought it was situational.  Patient was seen in the urgent care on Nov 12, 2019 where her heart rate was recorded as 120.  Patient reports at that time she was uncomfortable due to having some muscle strain in her trapezius muscles bilaterally.  She is not currently taking any medication that would cause her blood pressure to be elevated.  She does states that she had 1 cup of hot chocolate this morning which is not unusual for her.  She denies any excess caffeine intake.      The following portions of the patient's history were reviewed and updated as appropriate: allergies, current medications, past family history, past medical history, past social history, past surgical history and problem list.    Review of Systems   Constitutional: Negative for activity change, appetite change, chills, fatigue and fever.   HENT: Negative for sinus pressure and sinus pain.    Eyes: Negative for visual disturbance.   Respiratory: Negative for chest tightness and shortness of breath.    Cardiovascular: Negative for chest pain.        Tachycardia   Gastrointestinal: Negative for abdominal pain, constipation, diarrhea, nausea and vomiting.   Endocrine: Negative for polydipsia and polyphagia.   Genitourinary: Negative for difficulty urinating.   Musculoskeletal: Negative for arthralgias.   Skin: Negative for rash.   Neurological: Negative for headaches.   Psychiatric/Behavioral: Negative for suicidal ideas.   All  other systems reviewed and are negative.          Objective:    Physical Exam  Vitals reviewed.   Constitutional:       Appearance: Normal appearance.   HENT:      Head: Normocephalic and atraumatic.      Right Ear: Tympanic membrane, ear canal and external ear normal.      Left Ear: Tympanic membrane, ear canal and external ear normal.      Nose: Nose normal.      Mouth/Throat:      Mouth: Mucous membranes are moist.      Pharynx: Oropharynx is clear.   Eyes:      Extraocular Movements: Extraocular movements intact.      Conjunctiva/sclera: Conjunctivae normal.      Pupils: Pupils are equal, round, and reactive to light.   Cardiovascular:      Rate and Rhythm: Regular rhythm. Tachycardia present.      Pulses: Normal pulses.      Heart sounds: Normal heart sounds.   Pulmonary:      Effort: Pulmonary effort is normal. No respiratory distress.      Breath sounds: Normal breath sounds.   Abdominal:      General: Bowel sounds are normal.      Palpations: Abdomen is soft.   Musculoskeletal:         General: Normal range of motion.      Cervical  back: Normal range of motion and neck supple.   Skin:     General: Skin is warm.      Capillary Refill: Capillary refill takes less than 2 seconds.   Neurological:      General: No focal deficit present.      Mental Status: She is alert and oriented to person, place, and time.   Psychiatric:         Attention and Perception: Attention normal.         Mood and Affect: Mood normal.         Speech: Speech normal.         Behavior: Behavior normal.         Thought Content: Thought content normal.         Cognition and Memory: Cognition normal.         Judgment: Judgment normal.     EKG sinus tachycardia with a rate of 105 without ectopy.  This EKG was interpreted by me        Assessment:       1. Acquired hypothyroidism    2. Tachycardia          Plan:       1.  TSH within normal limits at 1.6.  She is to continue Synthroid 88 mcg daily  2.  Patient's EKG refilled sinus tachycardia.   We will check a Holter monitor.  Encourage patient to go to the local emergency department if she develops any cardiac symptoms which were reviewed with patient.  A handout was also given to patient on tachycardia    Follow-up in 4 to 6 weeks for recheck and as needed    Orders Placed This Encounter    POCT (EKG) ECG 12 lead    Holter Monitor Hours: 48    moxifloxacin (VIGAMOX) 0.5 % ophthalmic solution

## 2020-01-12 NOTE — Patient Instructions (Signed)
Understanding Tachycardia    The heart has an electrical system that sends signals to control the heartbeat. Any abnormal change in the speed or pattern of the heartbeat is called an arrhythmia. If you have an arrhythmia that causes the heart to beat faster than normal, this is known as tachycardia. There are many types of tachycardia. They can affect the heart’s upper chambers (atria), the heart’s lower chambers (ventricles), or both.  What causes tachycardia?  Many things can cause tachycardia, including:  · Damage to heart tissue from heart disease, past heart attack, or heart surgery  · Abnormal electrical pathways in the heart  · Problems with the heart’s structure that you are born with (congenital heart defect)  · High blood pressure  · Overactive thyroid  · Use of certain medicines  · Severe blood loss or anemia  · Dehydration  · Severe stress, fear, or anxiety  · Smoking  · Too much alcohol or caffeine  · Abuse of certain street drugs, such as cocaine  · Infections  · Certain inflammatory conditions  · Chronic  pain syndromes  What are the symptoms of tachycardia?  Tachycardia can cause a fast, pounding, or irregular heartbeat. It can also make it harder for the heart to pump blood efficiently to the body. This may cause symptoms such as:  · Shortness of breath  · Tiredness  · Dizziness or fainting  · Chest pain  Some people with tachycardia may have no symptoms at all.  How is tachycardia treated?  Treatment for tachycardia depends on the cause. It also depends on the type you have and how severe your symptoms are. Tachycardia in the ventricles is often more serious than in the atria. For this reason, it may need to be treated right away. Possible treatments include:  · Treating the underlying cause. For instance, if a medicine is causing your tachycardia, changing the dosage or stopping the medicine with your doctor’s guidance may correct the problem.  · Lifestyle changes. These include getting enough  sleep and reducing stress. They also include avoiding caffeine, alcohol, tobacco, and illegal drugs.  · Vagal maneuvers. These are techniques that may help interrupt a fast heartbeat and slow it down. One example is to take a deep breath and bear down hard while holding your breath.   · Medicines. These may be used to help slow down a fast heartbeat. They may also be used to regulate the pattern of the heartbeat.  · Electrical cardioversion. Special pads or paddles are used to send one or more brief  electrical shocks to the heart. This can help restore the heartbeat to normal.  · Ablation. A long thin tube called a catheter is inserted into a blood vessel and threaded to the heart. The catheter sends out hot or cold energy to the areas causing abnormal signals. This destroys tissue or cells where the abnormal electrical signal originates. This may stop certain types of tachycardia.  · Pacemaker. This is a device that is placed just under the skin in the chest. It sends paced signals to make the heart beat at a predetermined rate and rhythm. While it can't slow your heart rate, you may need this if certain medicines used to treat tachycardia result in a heart rate that is too slow .    · Implantable cardioverter defibrillator (ICD).  This is a device that is placed just under the skin in the chest or armpit. The ICD monitors your heart rate. When needed, it sends controlled burst of signals to the heart to   overdrive a tachycardia in the ventricles.  It can also send a single stronger shock to the heart to stop a life-threatening type of tachycardia, if needed.  · Surgery.During surgery, different techniques may be used to create scar tissue in the areas of the heart causing abnormal signals. This may help stop certain types of tachycardia.  What are possible complications of tachycardia?   These can include:  · Blood clots or stroke  · Heart failure. With this problem, the heart muscle is so weak it no longer pumps  blood well  · Fainting  · Sudden cardiac arrest. This is when the heart suddenly stops beating  When should I call my healthcare provider?   Call your healthcare provider right away if you have any of these:  · Symptoms that don’t get better with treatment, or get worse  · New symptoms  [employer] last reviewed this educational content on 10/14/2017    © 2000-2021 [employer]. All rights reserved. This information is not intended as a substitute for professional medical care. Always follow your healthcare professional's instructions.

## 2020-01-13 ENCOUNTER — Other Ambulatory Visit (HOSPITAL_COMMUNITY): Payer: Self-pay | Admitting: NURSE PRACTITIONER

## 2020-01-13 DIAGNOSIS — R000 Tachycardia, unspecified: Secondary | ICD-10-CM

## 2020-01-16 ENCOUNTER — Other Ambulatory Visit: Payer: Self-pay

## 2020-01-16 ENCOUNTER — Encounter (HOSPITAL_COMMUNITY): Payer: Self-pay | Admitting: Internal Medicine

## 2020-01-16 ENCOUNTER — Ambulatory Visit (INDEPENDENT_AMBULATORY_CARE_PROVIDER_SITE_OTHER): Payer: 59 | Admitting: Internal Medicine

## 2020-01-16 DIAGNOSIS — R0600 Dyspnea, unspecified: Secondary | ICD-10-CM

## 2020-01-16 DIAGNOSIS — J438 Other emphysema: Secondary | ICD-10-CM

## 2020-01-16 DIAGNOSIS — Z87891 Personal history of nicotine dependence: Secondary | ICD-10-CM

## 2020-01-16 NOTE — Progress Notes (Signed)
[facility] [place]  [address]  [place] [address]    History and Physical     Name: Gina Barrett MRN:  [id number]   Date: 01/16/2020 Age: 64 y.o.         Referring Provider:  Mertie Moores, MD  [address]  [place]  [place],  [place] [address]      Chief Complaint: New Patient and Difficulty Breathing    History of Present Illness  64 y.o. female  who presents for further evaluation dyspnea on exertion as well COPD/emphysema    She has a history of stage III lung cancer diagnosed and treated back in 2018.  She is being followed by Dr. Holley Dexter with every 6 months CT scans.  She had a recent left lower lobe CT-guided needle biopsy which did not exhibit any cancer.    She has noted dyspnea most notably while bending over or walking short distances.  She does state that she can walk a mileat a time.    She has no shortness of breath at rest she has no chronic cough.    She has a 25 pack-year history of smoking but quit 20 years ago.    She denies any chest tightness heaviness or wheezing with her dyspnea.    She has never tried any inhalers in the past    Past History  Current Outpatient Medications   Medication Sig   . carboxymethyl/glycerin/poly80 (REFRESH OPTIVE ADVANCED OPHT) Apply to eye Once per day as needed   . carboxymethylcellulose Sodium (THERATEARS) 0.25 % Ophthalmic Drops 1-2 Drops Once per day as needed   . hydrOXYchloroQUINE (PLAQUENIL) 200 mg Oral Tablet TAKE 2 TABLETS BY MOUTH EVERY DAY   . levothyroxine (SYNTHROID) 88 mcg Oral Tablet TAKE ONE TABLET BY MOUTH EVERY DAY   . loratadine (CLARITIN) 10 mg Oral Tablet take 10 mg by mouth Once a day.   . metaxalone (SKELAXIN) 800 mg Oral Tablet TAKE 1 TABLET BY MOUTH 3 TIMES DAILY AS NEEDED FOR MUSCLE SPASM. DO NOT DRIVE (Patient not taking: Reported on 01/16/2020)   . MULTIVITAMIN ORAL Take by mouth Once a day Centrum Silver Women's 50+   . Nifedipine (ADALAT CC) 30 mg Oral Tablet Sustained Release take 30 mg by mouth Once a day.   .  OMEGA-3 FATTY ACIDS (FISH OIL ORAL) take  by mouth Once a day.   . raloxifene (EVISTA) 60 mg Oral Tablet Take 60 mg by mouth Once a day   . UNKNOWN MEDICATION (UNKNOWN MEDICATION) Apply to eye [name] 128 Eye Drops OTC 4x daily  [name] 128 Eye Gel OTC Nightly     Allergies   Allergen Reactions   . Penicillins      Past Medical History:   Diagnosis Date   . Abnormal Pap smear     ASCUS, ASCUS cannot rule out HGSIL   . Cancer (CMS HCC)     non small cell lung cancer (R)-s/p Chemo/Radiation completed 8/18   . Colon polyp    . Dysphagia    . Hypothyroid    . Non-small cell lung cancer, right (CMS [place])     s/p Chemo completed 9/18   . Raynaud's disease    . STD (sexually transmitted disease)     HSV   . Unspecified disorder of thyroid     Hypothyroidism         Past Surgical History:   Procedure Laterality Date   . HX CATARACT REMOVAL Bilateral 2011   .  HX COLONOSCOPY  2009   . HX ELBOW SURGERY     . HX FOOT SURGERY     . HX TUBAL LIGATION  1978   . LUNG BIOPSY Right 10/02/2016   . MEDIASTINOSCOPY  11/13/2016   . PB FOREARM/WRIST SURGERY UNLISTED  1999    carpal tunnel release   . PORTACATH PLACEMENT  12/12/2016           Family History  Family Medical History:     Problem Relation (Age of Onset)    Breast Cancer Mother    Diabetes Paternal Grandfather, Brother    No Known Problems Father, Sister, Maternal Grandmother, Maternal Grandfather, Paternal 47, Daughter, Son, Maternal Aunt, Maternal Uncle, Paternal 79, Paternal Uncle, Other            Social History  Social History     Tobacco Use   . Smoking status: Former Smoker     Years: 20.00     Types: Cigarettes     Start date: 12/09/1970     Quit date: 09/08/1998     Years since quitting: 21.3   . Smokeless tobacco: Never Used   Substance Use Topics   . Alcohol use: No        Review of Systems:  Other than ROS in the HPI, all other systems were negative.  Flu shot annually.  She has received the Pneumovax in the past and has received both of her COVID vaccines.   She does have seasonal allergies    Physical  Examination:  BP 134/83   Pulse 62   Temp (!) 34.8 C (94.6 F) (Thermal Scan)   Resp 16   Ht 1.524 m (5')   Wt 49.9 kg (110 lb)   SpO2 99%   BMI 21.48 kg/m       Vitals: BP 134/83   Pulse 62   Temp (!) 34.8 C (94.6 F) (Thermal Scan)   Resp 16   Ht 1.524 m (5')   Wt 49.9 kg (110 lb)   SpO2 99%   BMI 21.48 kg/m     General: appears in good health, appears stated age and no distress  HENT:wearing a mask  Respiratory Observatoins Unlabored breathing  Lungs: clear to auscultation bilaterally.   Cardiovascular:    Heart regular rate and rhythm without murmer  Extremities: no cyanosis or edema  Neurologic: grossly normal  Psychiatric: affect normal      Data reviewed:      Imaging:   EXAMINATION: XR CHEST PA AND LATERAL     EXAM DATE/TIME: 05/02/2019 9:18 AM    CLINICAL INDICATION: M05.9: Rheumatoid arthritis with rheumatoid factor,  unspecified (CMS [place])    COMPARISON: 08/10/2009.    Quality of Exam: Adequate     PULMONARY: Emphysematous lungs. Nodular density noted at the left lung base  as well as blunting of the left costophrenic angle. This may be due to  pleural scarring or pleural effusion.     CARDIOVASCULAR: Normal appearance of cardiovascular structures.    CHEST WALL/SPINE: Osteopenia of the bones.    OTHER: None significant.      IMPRESSION:  1. Emphysematous lungs.  2. Ill-defined nodular opacity at the left lung base as well as blunting of  the left costophrenic angle this consistent with a subpleural consolidation  and pleural effusion noted on the prior CT study of 04/05/2019. A 3-4 week  follow-up radiographic evaluation is recommended.          Diagnosis:  1. Other  emphysema (CMS [place])      64 year old female with previous history of smoking as well as previous history of lung cancer treated with chemo and radiation.  She presents with several month history of dyspnea on exertion of unclear cause.  Most likely this is untreated COPD   Plan:  Check pulmonary function test  Consider 6 minute walk test  Return to clinic in 4 weeks    Follow Up:  Return in about 4 weeks (around 02/13/2020).        Volney Presser, MD

## 2020-01-18 ENCOUNTER — Other Ambulatory Visit: Payer: Self-pay

## 2020-01-19 ENCOUNTER — Other Ambulatory Visit (INDEPENDENT_AMBULATORY_CARE_PROVIDER_SITE_OTHER): Payer: Self-pay

## 2020-01-19 MED ORDER — NIFEDIPINE ER 30 MG PO TB24
30.0000 mg | ORAL_TABLET | Freq: Every day | ORAL | 3 refills | Status: DC
Start: ? — End: 2020-01-19

## 2020-01-20 ENCOUNTER — Other Ambulatory Visit: Payer: Self-pay

## 2020-01-20 ENCOUNTER — Ambulatory Visit
Admission: RE | Admit: 2020-01-20 | Discharge: 2020-01-20 | Disposition: A | Discharge status: Home / Self Care | Payer: 59 | Source: Ambulatory Visit | Attending: Internal Medicine | Admitting: Internal Medicine

## 2020-01-20 DIAGNOSIS — J438 Other emphysema: Secondary | ICD-10-CM | POA: Insufficient documentation

## 2020-02-13 ENCOUNTER — Ambulatory Visit
Admission: RE | Admit: 2020-02-13 | Discharge: 2020-02-13 | Disposition: A | Discharge status: Home / Self Care | Payer: 59 | Source: Ambulatory Visit | Attending: NURSE PRACTITIONER | Admitting: NURSE PRACTITIONER

## 2020-02-13 ENCOUNTER — Other Ambulatory Visit: Payer: Self-pay

## 2020-02-13 DIAGNOSIS — R000 Tachycardia, unspecified: Secondary | ICD-10-CM | POA: Insufficient documentation

## 2020-02-13 DIAGNOSIS — I493 Ventricular premature depolarization: Secondary | ICD-10-CM

## 2020-02-16 ENCOUNTER — Ambulatory Visit (INDEPENDENT_AMBULATORY_CARE_PROVIDER_SITE_OTHER): Payer: Commercial Managed Care - POS | Admitting: Family

## 2020-02-16 ENCOUNTER — Encounter (INDEPENDENT_AMBULATORY_CARE_PROVIDER_SITE_OTHER): Payer: Self-pay | Admitting: Family

## 2020-02-16 VITALS — BP 122/60 | HR 100 | Temp 98.0°F | Resp 18 | Ht 61.0 in | Wt 108.8 lb

## 2020-02-16 DIAGNOSIS — R000 Tachycardia, unspecified: Secondary | ICD-10-CM

## 2020-02-16 MED ORDER — NEBIVOLOL HCL 5 MG PO TABS
5.0000 mg | ORAL_TABLET | Freq: Every day | ORAL | 2 refills | Status: DC
Start: ? — End: 2020-02-16

## 2020-02-16 NOTE — Patient Instructions (Signed)
Understanding Tachycardia    The heart has an electrical system that sends signals to control the heartbeat. Any abnormal change in the speed or pattern of the heartbeat is called an arrhythmia. If you have an arrhythmia that causes the heart to beat faster than normal, this is known as tachycardia. There are many types of tachycardia. They can affect the heart’s upper chambers (atria), the heart’s lower chambers (ventricles), or both.  What causes tachycardia?  Many things can cause tachycardia, including:  · Damage to heart tissue from heart disease, past heart attack, or heart surgery  · Abnormal electrical pathways in the heart  · Problems with the heart’s structure that you are born with (congenital heart defect)  · High blood pressure  · Overactive thyroid  · Use of certain medicines  · Severe blood loss or anemia  · Dehydration  · Severe stress, fear, or anxiety  · Smoking  · Too much alcohol or caffeine  · Abuse of certain street drugs, such as cocaine  · Infections  · Certain inflammatory conditions  · Chronic  pain syndromes  What are the symptoms of tachycardia?  Tachycardia can cause a fast, pounding, or irregular heartbeat. It can also make it harder for the heart to pump blood efficiently to the body. This may cause symptoms such as:  · Shortness of breath  · Tiredness  · Dizziness or fainting  · Chest pain  Some people with tachycardia may have no symptoms at all.  How is tachycardia treated?  Treatment for tachycardia depends on the cause. It also depends on the type you have and how severe your symptoms are. Tachycardia in the ventricles is often more serious than in the atria. For this reason, it may need to be treated right away. Possible treatments include:  · Treating the underlying cause. For instance, if a medicine is causing your tachycardia, changing the dosage or stopping the medicine with your doctor’s guidance may correct the problem.  · Lifestyle changes. These include getting enough  sleep and reducing stress. They also include avoiding caffeine, alcohol, tobacco, and illegal drugs.  · Vagal maneuvers. These are techniques that may help interrupt a fast heartbeat and slow it down. One example is to take a deep breath and bear down hard while holding your breath.   · Medicines. These may be used to help slow down a fast heartbeat. They may also be used to regulate the pattern of the heartbeat.  · Electrical cardioversion. Special pads or paddles are used to send one or more brief  electrical shocks to the heart. This can help restore the heartbeat to normal.  · Ablation. A long thin tube called a catheter is inserted into a blood vessel and threaded to the heart. The catheter sends out hot or cold energy to the areas causing abnormal signals. This destroys tissue or cells where the abnormal electrical signal originates. This may stop certain types of tachycardia.  · Pacemaker. This is a device that is placed just under the skin in the chest. It sends paced signals to make the heart beat at a predetermined rate and rhythm. While it can't slow your heart rate, you may need this if certain medicines used to treat tachycardia result in a heart rate that is too slow .    · Implantable cardioverter defibrillator (ICD).  This is a device that is placed just under the skin in the chest or armpit. The ICD monitors your heart rate. When needed, it sends controlled burst of signals to the heart to   overdrive a tachycardia in the ventricles.  It can also send a single stronger shock to the heart to stop a life-threatening type of tachycardia, if needed.  · Surgery.During surgery, different techniques may be used to create scar tissue in the areas of the heart causing abnormal signals. This may help stop certain types of tachycardia.  What are possible complications of tachycardia?   These can include:  · Blood clots or stroke  · Heart failure. With this problem, the heart muscle is so weak it no longer pumps  blood well  · Fainting  · Sudden cardiac arrest. This is when the heart suddenly stops beating  When should I call my healthcare provider?   Call your healthcare provider right away if you have any of these:  · Symptoms that don’t get better with treatment, or get worse  · New symptoms  [employer] last reviewed this educational content on 10/14/2017    © 2000-2021 [employer]. All rights reserved. This information is not intended as a substitute for professional medical care. Always follow your healthcare professional's instructions.

## 2020-02-16 NOTE — Progress Notes (Signed)
Subjective:    Patient ID: Melody Hendricks is a 63 y.o. female.    Patient here to follow-up on tachycardia.    Patient wore a Holter monitor that revealed the following results:  Venita Lick, MD - 02/16/2020   Formatting of this note might be different from the original.   Hookup Date/Tima Feb 13 2020 15:08:00   Acquisition Duration 47 Hr : 56 : Min   Number of QRS Complexes [id number]   Number of Ventricular Ectopics 152   Number of Ventricular Isolated Beats 152   Number of Ventricular Bigeminal Cycles 0   Number of Ventricular Couplets 0   Number of Ventricular Runs 0   Number of Ventricular Beats in Runs 0     Patient reports that she is not symptomatic whenever her heart rate goes above 100.  She denies any chest pain, shortness of breath or vertigo.    She did recently have a lung function test per pulmonology that revealed the following:  Pulmonary Function Diagnosis:   Moderate Obstructive Airways Disease   Moderate Restriction -Parenchymal   Moderately severe Diffusion Defect         The following portions of the patient's history were reviewed and updated as appropriate: allergies, current medications, past family history, past medical history, past social history, past surgical history and problem list.    Review of Systems   Constitutional: Negative for activity change, appetite change, chills, fatigue and fever.   HENT: Negative for sinus pressure and sinus pain.    Eyes: Negative for visual disturbance.   Respiratory: Negative for chest tightness and shortness of breath.    Cardiovascular: Negative for chest pain.        Tachycardia   Gastrointestinal: Negative for abdominal pain, constipation, diarrhea, nausea and vomiting.   Endocrine: Negative for polydipsia and polyphagia.   Genitourinary: Negative for difficulty urinating.   Musculoskeletal: Negative for arthralgias.   Skin: Negative for rash.   Neurological: Negative for headaches.   Psychiatric/Behavioral: Negative for suicidal ideas.    All other systems reviewed and are negative.          Objective:    Physical Exam  Vitals reviewed.   Constitutional:       Appearance: Normal appearance.   HENT:      Head: Normocephalic and atraumatic.      Right Ear: Tympanic membrane, ear canal and external ear normal.      Left Ear: Tympanic membrane, ear canal and external ear normal.      Nose: Nose normal.      Mouth/Throat:      Mouth: Mucous membranes are moist.      Pharynx: Oropharynx is clear.   Eyes:      Extraocular Movements: Extraocular movements intact.      Conjunctiva/sclera: Conjunctivae normal.      Pupils: Pupils are equal, round, and reactive to light.   Cardiovascular:      Rate and Rhythm: Normal rate and regular rhythm.      Pulses: Normal pulses.      Heart sounds: Normal heart sounds.      Comments: HR 100 BPM  Pulmonary:      Effort: Pulmonary effort is normal. No respiratory distress.      Breath sounds: Normal breath sounds.   Abdominal:      General: Bowel sounds are normal.      Palpations: Abdomen is soft.   Musculoskeletal:         General: Normal  range of motion.      Cervical back: Normal range of motion and neck supple.   Skin:     General: Skin is warm.      Capillary Refill: Capillary refill takes less than 2 seconds.   Neurological:      General: No focal deficit present.      Mental Status: She is alert and oriented to person, place, and time.   Psychiatric:         Attention and Perception: Attention normal.         Mood and Affect: Mood normal.         Speech: Speech normal.         Behavior: Behavior normal.         Thought Content: Thought content normal.         Cognition and Memory: Cognition normal.         Judgment: Judgment normal.             Assessment:       1. Tachycardia          Plan:       1.  Patient started on Bystolic 5 mg daily.  This selective beta-blocker was chosen due to patient's recent abnormal pulmonary function test.  Patient is encouraged to follow-up in 4 weeks for a recheck.  She is also  instructed to follow-up with pulmonology as already scheduled    Follow-up in 4 weeks and as needed  Orders Placed This Encounter    nebivolol (Bystolic) 5 MG tablet

## 2020-02-27 ENCOUNTER — Encounter (HOSPITAL_COMMUNITY): Payer: Self-pay | Admitting: Internal Medicine

## 2020-02-27 ENCOUNTER — Ambulatory Visit (INDEPENDENT_AMBULATORY_CARE_PROVIDER_SITE_OTHER): Payer: 59 | Admitting: Internal Medicine

## 2020-02-27 ENCOUNTER — Other Ambulatory Visit: Payer: Self-pay

## 2020-02-27 VITALS — BP 129/79 | HR 91 | Temp 99.5°F | Ht 60.0 in | Wt 107.6 lb

## 2020-02-27 DIAGNOSIS — Z87891 Personal history of nicotine dependence: Secondary | ICD-10-CM

## 2020-02-27 DIAGNOSIS — R0600 Dyspnea, unspecified: Secondary | ICD-10-CM

## 2020-02-27 DIAGNOSIS — J438 Other emphysema: Secondary | ICD-10-CM

## 2020-02-27 DIAGNOSIS — J449 Chronic obstructive pulmonary disease, unspecified: Secondary | ICD-10-CM

## 2020-02-27 MED ORDER — STIOLTO RESPIMAT 2.5 MCG-2.5 MCG/ACTUATION SOLUTION FOR INHALATION
2.0000 | Freq: Every day | RESPIRATORY_TRACT | 0 refills | Status: DC
Start: 2020-02-27 — End: 2020-03-16

## 2020-02-27 NOTE — Progress Notes (Signed)
Subjective:     Patient ID:  Gina Barrett is an 64 y.o. female     Chief Complaint:    Chief Complaint   Patient presents with   . Follow Up     PFT   . COPD       HPI  Here for further evaluation of dyspnea.    She is still limited to some degree not being able to walk as far as she feels like she should.    She has no shortness of breath at rest and she has minimal cough.    She had PFTs done which showed moderate airway obstruction and a mild diffusing abnormality.  She has never tried inhalers but is willing to do so  Past Medical History  Current Outpatient Medications   Medication Sig   . carboxymethyl/glycerin/poly80 (REFRESH OPTIVE ADVANCED OPHT) Apply to eye Once per day as needed   . carboxymethylcellulose Sodium (THERATEARS) 0.25 % Ophthalmic Drops 1-2 Drops Once per day as needed   . hydrOXYchloroQUINE (PLAQUENIL) 200 mg Oral Tablet TAKE 2 TABLETS BY MOUTH EVERY DAY   . levothyroxine (SYNTHROID) 88 mcg Oral Tablet TAKE ONE TABLET BY MOUTH EVERY DAY   . loratadine (CLARITIN) 10 mg Oral Tablet take 10 mg by mouth Once a day.   . metaxalone (SKELAXIN) 800 mg Oral Tablet TAKE 1 TABLET BY MOUTH 3 TIMES DAILY AS NEEDED FOR MUSCLE SPASM. DO NOT DRIVE   . MULTIVITAMIN ORAL Take by mouth Once a day Centrum Silver Women's 50+   . Nifedipine (ADALAT CC) 30 mg Oral Tablet Sustained Release take 30 mg by mouth Once a day.   . OMEGA-3 FATTY ACIDS (FISH OIL ORAL) take  by mouth Once a day.   . raloxifene (EVISTA) 60 mg Oral Tablet Take 60 mg by mouth Once a day   . UNKNOWN MEDICATION (UNKNOWN MEDICATION) Apply to eye [name] 128 Eye Drops OTC 4x daily  [name] 128 Eye Gel OTC Nightly     Allergies   Allergen Reactions   . Penicillins      Past Medical History:   Diagnosis Date   . Abnormal Pap smear     ASCUS, ASCUS cannot rule out HGSIL   . Cancer (CMS HCC)     non small cell lung cancer (R)-s/p Chemo/Radiation completed 8/18   . Colon polyp    . Dysphagia    . Hypothyroid    . Non-small cell lung cancer, right  (CMS [place])     s/p Chemo completed 9/18   . Raynaud's disease    . STD (sexually transmitted disease)     HSV   . Unspecified disorder of thyroid     Hypothyroidism         Past Surgical History:   Procedure Laterality Date   . HX CATARACT REMOVAL Bilateral 2011   . HX COLONOSCOPY  2009   . HX ELBOW SURGERY     . HX FOOT SURGERY     . HX TUBAL LIGATION  1978   . LUNG BIOPSY Right 10/02/2016   . MEDIASTINOSCOPY  11/13/2016   . PB FOREARM/WRIST SURGERY UNLISTED  1999    carpal tunnel release   . PORTACATH PLACEMENT  12/12/2016         Family Medical History:     Problem Relation (Age of Onset)    Breast Cancer Mother    Diabetes Paternal Grandfather, Brother    No Known Problems Father, Sister, Maternal Grandmother, Maternal Grandfather, Paternal  Grandmother, Daughter, Son, Maternal Aunt, Maternal Uncle, Paternal 85, Paternal Uncle, Other            Social History     Socioeconomic History   . Marital status: Married     Spouse name: Not on file   . Number of children: Not on file   . Years of education: Not on file   . Highest education level: Not on file   Occupational History     Employer: [employer]   Tobacco Use   . Smoking status: Former Smoker     Years: 20.00     Types: Cigarettes     Start date: 12/09/1970     Quit date: 09/08/1998     Years since quitting: 21.4   . Smokeless tobacco: Never Used   Substance and Sexual Activity   . Alcohol use: No   . Drug use: No   . Sexual activity: Yes     Partners: Male     Birth control/protection: None   Other Topics Concern   . Breast Self Exam Yes   . Calcium intake adequate No   . Ability to Walk 1 Flight of Steps without SOB/CP Yes   . Ability To Do Own ADL's Yes     Social Determinants of Health     Financial Resource Strain:    . Difficulty of Paying Living Expenses:    Food Insecurity:    . Worried About Charity fundraiser in the Last Year:    . Arboriculturist in the Last Year:    Transportation Needs:    . Film/video editor (Medical):    Marland Kitchen Lack of  Transportation (Non-Medical):    Physical Activity:    . Days of Exercise per Week:    . Minutes of Exercise per Session:    Stress:    . Feeling of Stress :    Intimate Partner Violence:    . Fear of Current or Ex-Partner:    . Emotionally Abused:    Marland Kitchen Physically Abused:    . Sexually Abused:      Review of Systems   Respiratory: Positive for shortness of breath.    All other systems reviewed and are negative.      Objective:     Physical Exam  Constitutional:       Appearance: Normal appearance.   Cardiovascular:      Rate and Rhythm: Normal rate and regular rhythm.      Heart sounds: Normal heart sounds.   Pulmonary:      Effort: Pulmonary effort is normal.   Skin:     General: Skin is warm and dry.   Neurological:      General: No focal deficit present.      Mental Status: She is alert and oriented to person, place, and time.   Psychiatric:         Mood and Affect: Mood normal.         Behavior: Behavior normal.         Ortho Exam  Visit Date: 01/20/2020 Second ID: [id number]   Referring Doctor: Creta Levin MD, [place]   Reviewing Doctor: [facility] Pulmonologist   Pulmonary Doctor: [facility] Pulmonologist   Technician: Truett Mainland   Age: 61 DOB: September 07, 1955 Sex: Female Race: Caucasian   Height: 61.00 Inches Weight: 109.00 Lbs BSA: 1.46   Order IDs: [id number]   Requested Test(s): <PULMONARY FUNCTION TESTING>   Diagnosis: Other emphysema (CMS HCC) [J43.8]  Dyspnea: On hills and stairs Cough: Non-Productive Wheeze: No Wheeze   Tbco Prod: Cigarette Yrs Smk: 28.0 Pks/Day: 1.0 Yrs Quit: 21.0   Medications: No respiratory medications   Pre Test Comments: Hx lung cancer, Raynaud's disease, dysphagia   Post Test Comments: Full Pulm Funct Study (SPIRO W/BD,DLCO,LV)      Good patient effort and cooperation. 4 puffs of 53mcg Albuterol inhaler given   with spacer. No adverse reactions noted. The results of this test meet the   ATS standards for acceptability and repeatability.   Review Status: Completed+Posted+Locked                                Pre-Bronch  Post-Bronch                   Pred  LLN  ULN  Actual %Pred Actual %Chng   SPIROMETRY   FVC (L)             2.79  2.16  3.42  1.94   69  1.92   -1   FEV1 (L)             2.13  1.60  2.66  1.38   64  1.45   4   FEV1/FVC (%)            77   67   87   71   92   75   5   FEF 25% (L/sec)         4.54  2.40  6.68  3.44   75  3.89   13   FEF 50% (L/sec)         3.48  1.67  5.29  1.12   32  1.40   24   FEF 75% (L/sec)         1.09  0.13  2.05  0.37   34  0.35   -5   FEF 25-75% (L/sec)        1.95  0.83  3.07  0.89   45  1.03   15   FEF Max (L/sec)         5.52  3.97  7.07  4.21   76  4.57   8   FIVC (L)                       1.71     1.78   4   FIF 50% (L/sec)         3.04  1.60  4.48  2.07   68  2.04   -1   FIF Max (L/sec)                    2.12     2.67   25   Expiratory Time (sec)                 7.17     7.13       Back Extrap Vol (L)                  0.07     0.06   -6   Time To FEFmax (sec)                 0.082     0.075   -9   LUNG VOLUMES   SVC (L)  2.79  2.16  3.42  1.81   64          IC (L)              2.00         1.25   62          ERV (L)             0.79         0.55   69          TGV (L)             2.60  1.56  3.64  2.22   85          RV (Pleth) (L)          1.94  1.18  2.70  1.67   86          TLC (Pleth) (L)         4.60  3.52  5.68  3.48   75          RV/TLC (Pleth) (%)         42   32   52   48  114          DIFFUSION   DLCOunc  (ml/min/mmHg)      20.00        10.98   54          DL/VA (ml/min/mmHg/L)      4.83         3.75   77          VA (L)              4.60  3.71  5.49  2.93   63          BHT (sec)                      10.38              IVC (L)                        1.80              TLC (SB) (L)                     3.08              AIRWAYS RESISTANCE   Raw (cmH2O/L/s)         1.86  1.15  2.57  1.89  101          Gaw (L/s/cmH2O)         1.03         0.53   51          sRaw (cmH2O*s)          4.76         4.62   97          sGaw (1/cmH2O*s)         0.20  0.13  0.27  0.22  108            Interpretation: The FVC, FEV1, FEV1/FVC ratio and FEF25-75% are reduced   indicating airway obstruction. It is impossible to adequately evaluate the   flow volume loop due to excessive variablity such as might be produced by   coughing. The airway resistance is normal. The TLC  and SVC are reduced, but   the FRC is normal. Following administration of bronchodilators, there is no   significant response. The reduced diffusing capacity indicates a moderately   severe loss of functional alveolar capillary surface. However, the diffusing   capacity was not corrected for the patient's hemoglobin.     Conclusions: The diffusion defect and reduced lung volumes suggest an early   parenchymal process. Moderate airway obstruction is present. In view of the   severity of the diffusion defect, studies with exercise would be helpful to   evaluate the presence of hypoxemia.     Pulmonary Function Diagnosis:   Moderate Obstructive Airways Disease   Moderate Restriction -Parenchymal   Moderately severe Diffusion Defect   Assessment & Plan:       ICD-10-CM    1. 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

## 2020-02-27 NOTE — Nursing Note (Signed)
BP 129/79   Pulse 91   Temp 37.5 C (99.5 F) (Thermal Scan)   Ht 1.524 m (5')   Wt 48.8 kg (107 lb 9.6 oz)   SpO2 98%   BMI 21.01 kg/m   [place] Bjornstad, LPN  5/52/0802, 23:36

## 2020-02-27 NOTE — Patient Instructions (Signed)
Stiolto Respimat Metered Dose Inhaler 2.5 mcg / 2.5 mcg per inhalation  Uses  For chronic lung disease (COPD).  Instructions  It is very important that you take the medicine at about the same time every day. It will work best if you do this.  Keep the medicine at room temperature. Avoid heat and direct light.  Ask your doctor, nurse or pharmacist to show you how to use this medicine correctly.  The medicine is a very fine powder. Some patients may not feel or taste the medicine. Do not take an extra dose of medicine if you do not taste or feel the medicine being delivered.  If you are using more than one puff or more than one inhaled medicine, wait at least 1 minute between puffs.  Do not suddenly stop using this medicine. Stopping suddenly could cause your breathing to become worse.  It is important that you keep taking each dose of this medicine on time even if you are feeling well.  If you forget to take a dose on time, take it as soon as you remember. If it is almost time for the next dose, do not take the missed dose. Return to your normal dosing schedule. Do not take 2 doses of this medicine at one time.  Please tell your doctor and pharmacist about all the medicines you take. Include both prescription and over-the-counter medicines. Also tell them about any vitamins, herbal medicines, or anything else you take for your health.  Do not share this medicine with anyone who has not been prescribed this medicine.  It is very important that you keep all appointments for medical exams and tests while on this medicine.  Cautions  Tell your doctor and pharmacist if you ever had an allergic reaction to a medicine. Symptoms of an allergic reaction can include trouble breathing, skin rash, itching, swelling, or severe dizziness.  Do not use the medication any more than instructed.  Be very careful not to spray the medicine into your eyes. Wash your hands thoroughly before touching your eyes.  If this medicine causes  more wheezing or makes it harder to breathe, stop the medicine. Get medical help right away.  Tell the doctor or pharmacist if you are pregnant, planning to be pregnant, or breastfeeding.  Do not use this medicine during an asthma attack.  This medicine will not stop an asthma attack once it has started.  Ask your pharmacist if this medicine can interact with any of your other medicines. Be sure to tell them about all the medicines you take.  Please tell all your doctors and dentists that you are on this medicine before they provide care.  Do not start or stop any other medicines without first speaking to your doctor or pharmacist.  It is very important that you follow your doctor's instructions for all blood tests.  If you have any trouble using the medicine, please tell your doctor, nurse or pharmacist.  Side Effects  The following is a list of some common side effects from this medicine. Please speak with your doctor about what you should do if you experience these or other side effects.   agitated feeling or trouble sleeping   nervousness   shakiness  Call your doctor or get medical help right away if you notice any of these more serious side effects:   rarely, a severe episode of asthma may occur   chest pain   eye pain or redness   fainting   fast  or irregular heart beats   high blood pressure   muscle cramps   muscle weakness   shortness of breath   excessive thirst   difficulty or discomfort urinating   increased urinary frequency   seeing halos or colors around lights   worsening breathing symptoms  A few people may have an allergic reaction to this medicine. Symptoms can include difficulty breathing, skin rash, itching, swelling, or severe dizziness. If you notice any of these symptoms, seek medical help quickly.  Extra  Please speak with your doctor, nurse, or pharmacist if you have any questions about this medicine.  [email or web address]  IMPORTANT NOTE: This  document tells you briefly how to take your medicine, but it does not tell you all there is to know about it.Your doctor or pharmacist may give you other documents about your medicine. Please talk to them if you have any questions.Always follow their advice. There is a more complete description of this medicine available in [place].Scan this code on your smartphone or tablet or use the web address below. You can also ask your pharmacist for a printout. If you have any questions, please ask your pharmacist.    2021 [employer].

## 2020-02-28 ENCOUNTER — Encounter (INDEPENDENT_AMBULATORY_CARE_PROVIDER_SITE_OTHER): Payer: Self-pay

## 2020-02-28 MED ORDER — STIOLTO RESPIMAT 2.5 MCG-2.5 MCG/ACTUATION SOLUTION FOR INHALATION
2.0000 | Freq: Every day | RESPIRATORY_TRACT | 0 refills | Status: DC
Start: 2020-02-28 — End: 2020-03-16

## 2020-02-28 NOTE — Progress Notes (Unsigned)
Scanned

## 2020-03-05 ENCOUNTER — Encounter (INDEPENDENT_AMBULATORY_CARE_PROVIDER_SITE_OTHER): Payer: Self-pay | Admitting: Family

## 2020-03-05 ENCOUNTER — Telehealth (INDEPENDENT_AMBULATORY_CARE_PROVIDER_SITE_OTHER): Payer: Commercial Managed Care - POS | Admitting: Family

## 2020-03-05 VITALS — Resp 18 | Ht 61.0 in | Wt 107.0 lb

## 2020-03-05 DIAGNOSIS — M5416 Radiculopathy, lumbar region: Secondary | ICD-10-CM

## 2020-03-05 NOTE — Progress Notes (Signed)
Subjective:    Patient ID: Melody Hendricks is a 64 y.o. female.    Consultation for back pain that radiates into her right leg, thigh, and knee    Verbal consent has been obtained from the patient to conduct a video and telephone: yes  Telemedicine Documentation Requirements    Originating site (Patient location): home  Distant site (Provider location): [facility] [place]  Provider and Title: Marya Landry, FNP  Consent obtained: YES/NO: Yes  Language, if applicable and if translator was required: [place]    Tele conference lasted about 10 minutes    She denies injury. States discuss her symptoms with her Rheumatology and was instructed to contact to contact brain and spine center in [place] based on her symptoms not being related to her RA.            Back Pain  This is a new problem. The current episode started more than 1 month ago. The problem occurs intermittently. The problem has been waxing and waning since onset. The pain is present in the lumbar spine. The pain radiates to the right knee and right thigh. The pain is at a severity of 10/10. The pain is severe. Worse during: worse after resting. The symptoms are aggravated by bending, lying down, sitting and standing. Stiffness is present in the morning. Pertinent negatives include no abdominal pain, bladder incontinence, chest pain, fever, headaches, perianal numbness or tingling. Risk factors include menopause (RA). She has tried NSAIDs for the symptoms. The treatment provided mild relief.       The following portions of the patient's history were reviewed and updated as appropriate: allergies, current medications, past family history, past medical history, past social history, past surgical history and problem list.    Review of Systems   Constitutional: Negative for activity change, appetite change, chills, fatigue and fever.   HENT: Negative for sinus pressure and sinus pain.    Eyes: Negative for visual disturbance.    Respiratory: Negative for chest tightness and shortness of breath.    Cardiovascular: Negative for chest pain.   Gastrointestinal: Negative for abdominal pain, constipation, diarrhea, nausea and vomiting.   Endocrine: Negative for polydipsia and polyphagia.   Genitourinary: Negative for bladder incontinence and difficulty urinating.   Musculoskeletal: Positive for back pain (lumbar). Negative for arthralgias.        Right knee and thigh.    Skin: Negative for rash.   Neurological: Negative for tingling and headaches.   Psychiatric/Behavioral: Negative for suicidal ideas.   All other systems reviewed and are negative.          Objective:    Physical Exam  Vitals reviewed.   Constitutional:       Appearance: Normal appearance.   HENT:      Head: Normocephalic.      Nose: Nose normal.      Mouth/Throat:      Mouth: Mucous membranes are moist.   Eyes:      Extraocular Movements: Extraocular movements intact.      Conjunctiva/sclera: Conjunctivae normal.      Pupils: Pupils are equal, round, and reactive to light.   Pulmonary:      Effort: Pulmonary effort is normal. No respiratory distress.   Neurological:      Mental Status: She is alert and oriented to person, place, and time.   Psychiatric:         Mood and Affect: Mood normal.  Behavior: Behavior normal.         Thought Content: Thought content normal.         Judgment: Judgment normal.             Assessment:       1. Lumbar radiculopathy          Plan:       1. Referral for MRI. Discussed need to be careful lifting.    Follow up as scheduled and prn    Orders Placed This Encounter    MRI Lumbar spine without contrast    Tiotropium Bromide-Olodaterol (Stiolto Respimat) 2.5-2.5 MCG/ACT Aero Soln

## 2020-03-15 ENCOUNTER — Ambulatory Visit (INDEPENDENT_AMBULATORY_CARE_PROVIDER_SITE_OTHER): Payer: Commercial Managed Care - POS | Admitting: Family

## 2020-03-15 ENCOUNTER — Encounter (INDEPENDENT_AMBULATORY_CARE_PROVIDER_SITE_OTHER): Payer: Self-pay | Admitting: Family

## 2020-03-15 VITALS — BP 110/74 | HR 69 | Temp 98.9°F | Resp 18 | Ht 61.0 in | Wt 104.0 lb

## 2020-03-15 DIAGNOSIS — Z23 Encounter for immunization: Secondary | ICD-10-CM

## 2020-03-15 DIAGNOSIS — M5416 Radiculopathy, lumbar region: Secondary | ICD-10-CM

## 2020-03-15 DIAGNOSIS — R000 Tachycardia, unspecified: Secondary | ICD-10-CM

## 2020-03-15 NOTE — Patient Instructions (Signed)
Understanding Tachycardia    The heart has an electrical system that sends signals to control the heartbeat. Any abnormal change in the speed or pattern of the heartbeat is called an arrhythmia. If you have an arrhythmia that causes the heart to beat faster than normal, this is known as tachycardia. There are many types of tachycardia. They can affect the heart’s upper chambers (atria), the heart’s lower chambers (ventricles), or both.  What causes tachycardia?  Many things can cause tachycardia, including:  · Damage to heart tissue from heart disease, past heart attack, or heart surgery  · Abnormal electrical pathways in the heart  · Problems with the heart’s structure that you are born with (congenital heart defect)  · High blood pressure  · Overactive thyroid  · Use of certain medicines  · Severe blood loss or anemia  · Dehydration  · Severe stress, fear, or anxiety  · Smoking  · Too much alcohol or caffeine  · Abuse of certain street drugs, such as cocaine  · Infections  · Certain inflammatory conditions  · Chronic  pain syndromes  What are the symptoms of tachycardia?  Tachycardia can cause a fast, pounding, or irregular heartbeat. It can also make it harder for the heart to pump blood efficiently to the body. This may cause symptoms such as:  · Shortness of breath  · Tiredness  · Dizziness or fainting  · Chest pain  Some people with tachycardia may have no symptoms at all.  How is tachycardia treated?  Treatment for tachycardia depends on the cause. It also depends on the type you have and how severe your symptoms are. Tachycardia in the ventricles is often more serious than in the atria. For this reason, it may need to be treated right away. Possible treatments include:  · Treating the underlying cause. For instance, if a medicine is causing your tachycardia, changing the dosage or stopping the medicine with your doctor’s guidance may correct the problem.  · Lifestyle changes. These include getting enough  sleep and reducing stress. They also include avoiding caffeine, alcohol, tobacco, and illegal drugs.  · Vagal maneuvers. These are techniques that may help interrupt a fast heartbeat and slow it down. One example is to take a deep breath and bear down hard while holding your breath.   · Medicines. These may be used to help slow down a fast heartbeat. They may also be used to regulate the pattern of the heartbeat.  · Electrical cardioversion. Special pads or paddles are used to send one or more brief  electrical shocks to the heart. This can help restore the heartbeat to normal.  · Ablation. A long thin tube called a catheter is inserted into a blood vessel and threaded to the heart. The catheter sends out hot or cold energy to the areas causing abnormal signals. This destroys tissue or cells where the abnormal electrical signal originates. This may stop certain types of tachycardia.  · Pacemaker. This is a device that is placed just under the skin in the chest. It sends paced signals to make the heart beat at a predetermined rate and rhythm. While it can't slow your heart rate, you may need this if certain medicines used to treat tachycardia result in a heart rate that is too slow .    · Implantable cardioverter defibrillator (ICD).  This is a device that is placed just under the skin in the chest or armpit. The ICD monitors your heart rate. When needed, it sends controlled burst of signals to the heart to   overdrive a tachycardia in the ventricles.  It can also send a single stronger shock to the heart to stop a life-threatening type of tachycardia, if needed.  · Surgery.During surgery, different techniques may be used to create scar tissue in the areas of the heart causing abnormal signals. This may help stop certain types of tachycardia.  What are possible complications of tachycardia?   These can include:  · Blood clots or stroke  · Heart failure. With this problem, the heart muscle is so weak it no longer pumps  blood well  · Fainting  · Sudden cardiac arrest. This is when the heart suddenly stops beating  When should I call my healthcare provider?   Call your healthcare provider right away if you have any of these:  · Symptoms that don’t get better with treatment, or get worse  · New symptoms  [employer] last reviewed this educational content on 10/14/2017    © 2000-2021 [employer]. All rights reserved. This information is not intended as a substitute for professional medical care. Always follow your healthcare professional's instructions.

## 2020-03-15 NOTE — Progress Notes (Signed)
Subjective:    Patient ID: Melody Hendricks is a 64 y.o. female.    Follow up on  Tachycardia  (primary encounter diagnosis)  Lumbar radiculopathy    Patient reports that since starting the Bystolic 5 mg her heart rate has been running in the 60-70 range. She denies any side effects to taking this medication.  Patient states that she is unsure if she is feeling better with her heart rate lower due to her having some back and right leg pain.  An MRI is already scheduled for patient but will not be performed until Monday next week. She denies any bowel or bladder incontinence.      The following portions of the patient's history were reviewed and updated as appropriate: allergies, current medications, past family history, past medical history, past social history, past surgical history and problem list.    Review of Systems   Constitutional: Negative for activity change, appetite change, chills, fatigue and fever.   HENT: Negative for sinus pressure and sinus pain.    Eyes: Negative for visual disturbance.   Respiratory: Negative for chest tightness and shortness of breath.    Cardiovascular: Negative for chest pain.   Gastrointestinal: Negative for abdominal pain, constipation, diarrhea, nausea and vomiting.   Endocrine: Negative for polydipsia and polyphagia.   Genitourinary: Negative for difficulty urinating.   Musculoskeletal: Negative for arthralgias.   Skin: Negative for rash.   Neurological: Negative for headaches.   Psychiatric/Behavioral: Negative for suicidal ideas.   All other systems reviewed and are negative.          Objective:    Physical Exam  Vitals reviewed.   Constitutional:       Appearance: Normal appearance.   HENT:      Head: Normocephalic and atraumatic.      Right Ear: Tympanic membrane, ear canal and external ear normal.      Left Ear: Tympanic membrane, ear canal and external ear normal.      Nose: Nose normal.      Mouth/Throat:      Mouth: Mucous membranes are moist.      Pharynx:  Oropharynx is clear.   Eyes:      Extraocular Movements: Extraocular movements intact.      Conjunctiva/sclera: Conjunctivae normal.      Pupils: Pupils are equal, round, and reactive to light.   Cardiovascular:      Rate and Rhythm: Normal rate and regular rhythm.      Pulses: Normal pulses.      Heart sounds: Normal heart sounds.   Pulmonary:      Effort: Pulmonary effort is normal. No respiratory distress.      Breath sounds: Normal breath sounds.   Abdominal:      General: Bowel sounds are normal.      Palpations: Abdomen is soft.   Musculoskeletal:         General: Normal range of motion.      Cervical back: Normal range of motion and neck supple.      Lumbar back: Tenderness present.        Back:         Legs:       Comments: tenderness   Skin:     General: Skin is warm.      Capillary Refill: Capillary refill takes less than 2 seconds.   Neurological:      General: No focal deficit present.      Mental Status: She is alert and  oriented to person, place, and time.   Psychiatric:         Attention and Perception: Attention normal.         Mood and Affect: Mood normal.         Speech: Speech normal.         Behavior: Behavior normal.         Thought Content: Thought content normal.         Cognition and Memory: Cognition normal.         Judgment: Judgment normal.             Assessment:       1. Tachycardia    2. Lumbar radiculopathy    3. Immunization due          Plan:       1. Resolved with Bystolic 5 mg  2. Encourage patient to keep her appointment for her already scheduled MRI of her lumbar spine on Monday. I will contact patient with these results when available  3. High-dose flu shot given per MA. Patient tolerated well  Follow up in 3 months for recheck and as needed    Orders Placed This Encounter    Flu vaccine HIGH-DOSE QUAD (PF) 65 yrs and older

## 2020-03-15 NOTE — Progress Notes (Signed)
Patient gave verbal consent to receive vaccination. Patient was given an IM injections. Patient tolerated well. Patient was given patient education on any side effects.  Toriana Sponsel, RMA

## 2020-03-16 ENCOUNTER — Telehealth (INDEPENDENT_AMBULATORY_CARE_PROVIDER_SITE_OTHER): Payer: Self-pay | Admitting: Family

## 2020-03-16 ENCOUNTER — Telehealth (HOSPITAL_COMMUNITY): Payer: Self-pay | Admitting: Internal Medicine

## 2020-03-16 DIAGNOSIS — J438 Other emphysema: Secondary | ICD-10-CM

## 2020-03-16 DIAGNOSIS — J449 Chronic obstructive pulmonary disease, unspecified: Secondary | ICD-10-CM

## 2020-03-16 MED ORDER — STIOLTO RESPIMAT 2.5 MCG-2.5 MCG/ACTUATION SOLUTION FOR INHALATION
2.0000 | Freq: Every day | RESPIRATORY_TRACT | 5 refills | Status: DC
Start: 2020-03-16 — End: 2020-12-05

## 2020-03-16 NOTE — Telephone Encounter (Signed)
I have sent the [place] via [facility] to [facility] [employer]

## 2020-03-16 NOTE — Telephone Encounter (Signed)
Patient is calling needing a refill on her Stiolto called to [employer]  I would normally send to the nurses but patient is completely out and we have no one here. Thanks! Iver Nestle Breining  03/16/2020, 11:27

## 2020-03-16 NOTE — Telephone Encounter (Signed)
Per patient fyi, her mri was going to be oct 4 but was a delay in getting prior authorization so it will be oct 13

## 2020-03-20 ENCOUNTER — Other Ambulatory Visit (INDEPENDENT_AMBULATORY_CARE_PROVIDER_SITE_OTHER): Payer: Self-pay | Admitting: Family

## 2020-03-29 ENCOUNTER — Telehealth (INDEPENDENT_AMBULATORY_CARE_PROVIDER_SITE_OTHER): Payer: Commercial Managed Care - POS | Admitting: Family

## 2020-03-29 ENCOUNTER — Encounter (INDEPENDENT_AMBULATORY_CARE_PROVIDER_SITE_OTHER): Payer: Self-pay | Admitting: Family

## 2020-03-29 VITALS — Ht 61.0 in | Wt 104.0 lb

## 2020-03-29 DIAGNOSIS — M5416 Radiculopathy, lumbar region: Secondary | ICD-10-CM

## 2020-03-29 MED ORDER — PREDNISONE 20 MG PO TABS
ORAL_TABLET | ORAL | 0 refills | Status: DC
Start: ? — End: 2020-03-29

## 2020-03-29 NOTE — Patient Instructions (Signed)
Back Pain (Acute or Chronic)    Back pain is one of the most common problems. The good news is that most people feel better in 1 to 2 weeks, and most of the rest in 1 to 2 months. Most people can remain active.  People who have paindescribe it differently--noteveryone is the same.   The pain can be sharp, stabbing, shooting, aching, cramping or burning.   Movement, standing, bending, lifting, sitting, or walking may worsen pain.   It can be limited to one spot or area, or it can be more generalized.   It can spread upwards, to the front, or go down your arms or legs (sciatica).   It can cause muscle spasm.  Most of the time, mechanical problems with the musclesor spine cause the pain. Mechanical problemsare usually caused by an injury to the muscles or ligaments. Illness can cause back pain, but it's usually not caused by a serious illness. Mechanical problems include:   Physical activity such as sports, exercise, work, or normal activity   Overexertion, lifting, pushing, pulling incorrectly or too aggressively   Sudden twisting, bending, or stretching from an accident, or accidental movement   Poor posture   Stretching or moving wrong, without noticing pain at the time   Poor coordination, lack of regular exercise (check with your doctor about this)   Spinal disc disease or arthritis   Stress  Pain can also be related to pregnancy, or illness like appendicitis, bladder or kidney infections, pelvic infections, and many other things.  Acute back pain usually gets better in1 to 2 weeks. Back pain related to disk disease, arthritis in the spinal joints, or narrowing of the spinal canal (spinal stenosis) can become chronic and last for months or years.  Unless you had a physical injury such as a car accident or fall, X-rays are usually not needed for the first assessment of back pain. If pain continues and does not respond to medical treatment, you may need X-rays and other tests.  Home care  Try  this home care advice:   When in bed, tryto find a position of comfort. A firm mattress is best. Try lying flat on your back with pillows under your knees. You can also try lying on your side with your knees bent up toward your chest and a pillow between your knees.   At first, don't try to stretch out the sore spots. If there is a strain, it's not like the good soreness you get after exercising without an injury. In this case, stretching may make it worse.   Don't sit for long periods, as in a long car ride or during othertravel. This puts more stress on the lower back than standing or walking.   During the first 24 to 72 hours after an acute injury or flare up of chronic back pain, apply an ice pack to the painful area for 20 minutes and then remove it for 20 minutes. Do this over a period of 60 to 90 minutes or several times a day. This will reduce swelling and pain. Wrap the ice pack in a thin towel or plastic to protect your skin.   You can start with ice, then switch to heat. Heat (hot shower, hot bath, or heating pad) reduces pain and works well for muscle spasms. Heat can be applied to the painful area for 20 minutes then remove it for 20 minutes. Do this over a period of 60 to 90 minutes or several times a   day. Don't sleep on a heating pad. It can lead to skin burns or tissue damage.   You can alternate ice and heat therapy. Talk with your doctor aboutthe best treatment for your back pain.   Therapeutic massage can help relax the back muscles without stretching them.   Be aware of safe lifting methods and don't lift anything without stretching first.  Medicines  Talk to your doctor before using medicine, especially if you have other medical problems or are taking other medicines.   You may use over-the-counter medicine as directed on the bottle to control pain, unless another pain medicine was prescribed. If you have chronic conditions like diabetes, liver or kidney disease, stomach ulcers, or  gastrointestinal bleeding, or are taking blood thinners, talk to your doctor before taking any medicine.   Be careful if you are given a prescription medicines, narcotics, or medicine for muscle spasms. They can cause drowsiness, affect your coordination, reflexes, and judgement. Don't drive or operate heavy machinery.  Follow-up care  Follow up with your healthcare provider, or as advised.  If X-rays were taken, you will be told of any new findings that may affect your care  Call 911  Call 911 if any of the following occur:   Trouble breathing   Confusion   Very drowsy or trouble awakening   Fainting or loss of consciousness   Rapid or very slow heart rate   Loss of bowel or bladder control  When to seek medical advice  Call your healthcare provider right away if any of these occur:   Pain becomes worse or spreads to your legs   Weakness or numbness in one or both legs   Numbness in the groin or genital area  [employer] last reviewed this educational content on 03/16/2018   2000-2021 [employer]. All rights reserved. This information is not intended as a substitute for professional medical care. Always follow your healthcare professional's instructions.

## 2020-03-29 NOTE — Progress Notes (Signed)
Subjective:    Patient ID: Melody Hendricks is a 64 y.o. female.    Consultation for lumbar radiculopathy     Verbal consent has been obtained from the patient to conduct a video and telephone: yes  Telemedicine Documentation Requirements    Originating site (Patient location): home  Distant site (Provider location): [facility] [place]  Provider and Title: Marya Landry, FNP  Consent obtained: YES/NO: Yes  Language, if applicable and if translator was required: [place]    Tele conference lasted 10 minutes      Patient states that she continues to have pain in her low back that radiates into her right leg. Her insurance denied her MRI.        The following portions of the patient's history were reviewed and updated as appropriate: allergies, current medications, past family history, past medical history, past social history, past surgical history and problem list.    Review of Systems   Constitutional: Negative for activity change, appetite change, chills, fatigue and fever.   HENT: Negative for sinus pressure and sinus pain.    Eyes: Negative for visual disturbance.   Respiratory: Negative for chest tightness and shortness of breath.    Cardiovascular: Negative for chest pain.   Gastrointestinal: Negative for abdominal pain, constipation, diarrhea, nausea and vomiting.   Endocrine: Negative for polydipsia and polyphagia.   Genitourinary: Negative for difficulty urinating.   Musculoskeletal: Positive for back pain (radiates into her right leg). Negative for arthralgias.   Skin: Negative for rash.   Neurological: Negative for headaches.   Psychiatric/Behavioral: Negative for suicidal ideas.   All other systems reviewed and are negative.          Objective:    Physical Exam  Vitals reviewed.   Constitutional:       Appearance: Normal appearance.   HENT:      Head: Normocephalic.      Nose: Nose normal.      Mouth/Throat:      Mouth: Mucous membranes are moist.   Eyes:      Extraocular  Movements: Extraocular movements intact.      Conjunctiva/sclera: Conjunctivae normal.      Pupils: Pupils are equal, round, and reactive to light.   Pulmonary:      Effort: Pulmonary effort is normal. No respiratory distress.   Neurological:      Mental Status: She is alert and oriented to person, place, and time.   Psychiatric:         Mood and Affect: Mood normal.         Behavior: Behavior normal.         Thought Content: Thought content normal.         Judgment: Judgment normal.             Assessment:       1. Lumbar radiculopathy          Plan:       1.  Prednisone taper given.  Referral made to [facility] physical therapy for eval and and treat    Follow-up in 6 weeks and as needed    Orders Placed This Encounter    Ambulatory referral to Physical Therapy    predniSONE (DELTASONE) 20 MG tablet

## 2020-04-09 ENCOUNTER — Encounter (HOSPITAL_COMMUNITY): Payer: Self-pay | Admitting: Internal Medicine

## 2020-04-09 ENCOUNTER — Ambulatory Visit (INDEPENDENT_AMBULATORY_CARE_PROVIDER_SITE_OTHER): Payer: 59 | Admitting: Internal Medicine

## 2020-04-09 ENCOUNTER — Other Ambulatory Visit: Payer: Self-pay

## 2020-04-09 VITALS — BP 117/69 | HR 102 | Temp 97.1°F | Ht 60.0 in | Wt 109.6 lb

## 2020-04-09 DIAGNOSIS — J449 Chronic obstructive pulmonary disease, unspecified: Secondary | ICD-10-CM

## 2020-04-09 DIAGNOSIS — J438 Other emphysema: Secondary | ICD-10-CM

## 2020-04-09 NOTE — Progress Notes (Signed)
Subjective:     Patient ID:  Gina Barrett is an 64 y.o. female     Chief Complaint:    Chief Complaint   Patient presents with    COPD    Medication Check     Patient states Stiolto is working well    Follow Up       HPI  She is doing well on Stiolto.  She feels like it works well for her.    She does not have a rescue inhaler.    She has less dyspnea when she ascends steps    She has received both COVID vaccines and has already received her flu shot.    She stays active, though she is having some issues with sciatica.  Past Medical History  Current Outpatient Medications   Medication Sig    carboxymethyl/glycerin/poly80 (REFRESH OPTIVE ADVANCED OPHT) Apply to eye Once per day as needed    carboxymethylcellulose Sodium (THERATEARS) 0.25 % Ophthalmic Drops 1-2 Drops Once per day as needed    hydrOXYchloroQUINE (PLAQUENIL) 200 mg Oral Tablet TAKE 2 TABLETS BY MOUTH EVERY DAY    levothyroxine (SYNTHROID) 88 mcg Oral Tablet TAKE ONE TABLET BY MOUTH EVERY DAY    loratadine (CLARITIN) 10 mg Oral Tablet take 10 mg by mouth Once a day.    metaxalone (SKELAXIN) 800 mg Oral Tablet TAKE 1 TABLET BY MOUTH 3 TIMES DAILY AS NEEDED FOR MUSCLE SPASM. DO NOT DRIVE    MULTIVITAMIN ORAL Take by mouth Once a day Centrum Silver Women's 50+    Nifedipine (ADALAT CC) 30 mg Oral Tablet Sustained Release take 30 mg by mouth Once a day.    OMEGA-3 FATTY ACIDS (FISH OIL ORAL) take  by mouth Once a day.    raloxifene (EVISTA) 60 mg Oral Tablet Take 60 mg by mouth Once a day    tiotropium-olodateroL (STIOLTO RESPIMAT) 2.5-2.5 mcg/actuation Inhalation Mist Take 2 INHALATION by inhalation Once a day    UNKNOWN MEDICATION (UNKNOWN MEDICATION) Apply to eye [name] 128 Eye Drops OTC 4x daily  [name] 128 Eye Gel OTC Nightly     Allergies   Allergen Reactions    Penicillins      Past Medical History:   Diagnosis Date    Abnormal Pap smear     ASCUS, ASCUS cannot rule out HGSIL    Cancer (CMS HCC)     non small cell lung cancer  (R)-s/p Chemo/Radiation completed 8/18    Colon polyp     Dysphagia     Hypothyroid     Non-small cell lung cancer, right (CMS HCC)     s/p Chemo completed 9/18    Raynaud's disease     STD (sexually transmitted disease)     HSV    Unspecified disorder of thyroid     Hypothyroidism         Past Surgical History:   Procedure Laterality Date    HX CATARACT REMOVAL Bilateral 2011    HX COLONOSCOPY  2009    HX ELBOW SURGERY      HX FOOT SURGERY      HX TUBAL LIGATION  1978    LUNG BIOPSY Right 10/02/2016    MEDIASTINOSCOPY  11/13/2016    PB FOREARM/WRIST SURGERY UNLISTED  1999    carpal tunnel release    PORTACATH PLACEMENT  12/12/2016         Family Medical History:     Problem Relation (Age of Onset)    Breast Cancer Mother  Diabetes Paternal Grandfather, Brother    No Known Problems Father, Sister, Maternal Grandmother, Maternal Grandfather, Paternal 8, Daughter, Son, Maternal Aunt, Maternal Uncle, Paternal 54, Paternal Uncle, Other            Social History     Socioeconomic History    Marital status: Married     Spouse name: Not on file    Number of children: Not on file    Years of education: Not on file    Highest education level: Not on file   Occupational History     Employer: [employer]   Tobacco Use    Smoking status: Former Smoker     Years: 20.00     Types: Cigarettes     Start date: 12/09/1970     Quit date: 09/08/1998     Years since quitting: 21.6    Smokeless tobacco: Never Used   Substance and Sexual Activity    Alcohol use: No    Drug use: No    Sexual activity: Yes     Partners: Male     Birth control/protection: None   Other Topics Concern    Breast Self Exam Yes    Calcium intake adequate No    Ability to Walk 1 Flight of Steps without SOB/CP Yes    Ability To Do Own ADL's Yes     Social Determinants of Health     Financial Resource Strain:     Difficulty of Paying Living Expenses:    Food Insecurity:     Worried About Charity fundraiser in the  Last Year:     Arboriculturist in the Last Year:    Transportation Needs:     Film/video editor (Medical):     Lack of Transportation (Non-Medical):    Physical Activity:     Days of Exercise per Week:     Minutes of Exercise per Session:    Stress:     Feeling of Stress :    Intimate Partner Violence:     Fear of Current or Ex-Partner:     Emotionally Abused:     Physically Abused:     Sexually Abused:      Review of Systems   Constitutional: Negative.    HENT: Negative.    Respiratory: Positive for shortness of breath. Negative for cough.    Cardiovascular: Negative.    Allergic/Immunologic: Negative.    All other systems reviewed and are negative.      Objective:     Physical Exam  Constitutional:       Appearance: Normal appearance.   HENT:      Head:      Comments: Wearing a mask.  Cardiovascular:      Rate and Rhythm: Normal rate and regular rhythm.      Heart sounds: Normal heart sounds.   Pulmonary:      Breath sounds: Normal breath sounds.   Skin:     General: Skin is warm and dry.   Neurological:      General: No focal deficit present.      Mental Status: She is alert and oriented to person, place, and time.   Psychiatric:         Mood and Affect: Mood normal.         Behavior: Behavior normal.         Ortho Exam    Assessment & Plan:       ICD-10-CM  1. Other emphysema (CMS HCC)  J43.8    2. Chronic obstructive pulmonary disease, unspecified COPD type (CMS HCC)  J44.9      She has moderate COPD and is doing well on [facility]  Flu shot  LDCT annually

## 2020-04-09 NOTE — Nursing Note (Signed)
BP 117/69   Pulse (!) 102   Temp 36.2 C (97.1 F) (Thermal Scan)   Ht 1.524 m (5')   Wt 49.7 kg (109 lb 9.6 oz)   BMI 21.40 kg/m   [employer], LPN  53/91/2258, 34:62

## 2020-04-19 ENCOUNTER — Encounter

## 2020-05-08 ENCOUNTER — Encounter (INDEPENDENT_AMBULATORY_CARE_PROVIDER_SITE_OTHER): Payer: Self-pay

## 2020-05-08 DIAGNOSIS — Z79899 Other long term (current) drug therapy: Secondary | ICD-10-CM | POA: Diagnosis not present

## 2020-05-08 DIAGNOSIS — Z0000 Encounter for general adult medical examination without abnormal findings: Secondary | ICD-10-CM | POA: Diagnosis not present

## 2020-05-08 DIAGNOSIS — Z008 Encounter for other general examination: Secondary | ICD-10-CM | POA: Diagnosis not present

## 2020-05-08 DIAGNOSIS — E049 Nontoxic goiter, unspecified: Secondary | ICD-10-CM | POA: Diagnosis not present

## 2020-05-08 DIAGNOSIS — Z6841 Body Mass Index (BMI) 40.0 and over, adult: Secondary | ICD-10-CM | POA: Diagnosis not present

## 2020-05-08 DIAGNOSIS — Z1159 Encounter for screening for other viral diseases: Secondary | ICD-10-CM | POA: Diagnosis not present

## 2020-05-08 DIAGNOSIS — I10 Essential (primary) hypertension: Secondary | ICD-10-CM | POA: Diagnosis not present

## 2020-05-14 ENCOUNTER — Encounter (INDEPENDENT_AMBULATORY_CARE_PROVIDER_SITE_OTHER): Payer: Self-pay | Admitting: Family

## 2020-05-14 ENCOUNTER — Ambulatory Visit (INDEPENDENT_AMBULATORY_CARE_PROVIDER_SITE_OTHER): Payer: Commercial Managed Care - POS | Admitting: Family

## 2020-05-14 VITALS — BP 112/64 | HR 68 | Temp 98.4°F | Resp 18 | Ht 61.0 in | Wt 103.8 lb

## 2020-05-14 DIAGNOSIS — M5416 Radiculopathy, lumbar region: Secondary | ICD-10-CM

## 2020-05-14 NOTE — Progress Notes (Signed)
Subjective:    Patient ID: Melody Hendricks is a 64 y.o. female.    Here to follow up on back pain with radicular symptoms     Patient's insurance would not approve an MRI until patient completed PT.  She states that tomorrow will be her 8 th session and she does not feel it has help her pain.  She continues to have low back pain that radiates into her right leg. She rates her pain 5-6/10.  States she is taking Motrin, which helps, but after 6  Hours when it wears off her pain level is a 10/10.  She denies any bowel or bladder incontinence. Pain is worse in AM and @ night. Pain does keep her up @ night due to the pain increasing with position chang in the bed.    No reported falls, but admits that she will stumbles @ times. She states it feels like her right leg will "give out"    Since starting PT she now has increased pain in her right knee. No swelling or injury reported.          The following portions of the patient's history were reviewed and updated as appropriate: allergies, current medications, past family history, past medical history, past social history, past surgical history and problem list.    Review of Systems   Musculoskeletal: Positive for back pain (right radicular pain).   All other systems reviewed and are negative.          Objective:    Physical Exam  Vitals reviewed.   Constitutional:       Appearance: Normal appearance.   HENT:      Head: Normocephalic and atraumatic.      Right Ear: Tympanic membrane, ear canal and external ear normal.      Left Ear: Tympanic membrane, ear canal and external ear normal.      Nose: Nose normal.      Mouth/Throat:      Mouth: Mucous membranes are moist.      Pharynx: Oropharynx is clear.   Eyes:      Extraocular Movements: Extraocular movements intact.      Conjunctiva/sclera: Conjunctivae normal.      Pupils: Pupils are equal, round, and reactive to light.   Cardiovascular:      Rate and Rhythm: Normal rate and regular rhythm.      Pulses: Normal  pulses.      Heart sounds: Normal heart sounds.   Pulmonary:      Effort: Pulmonary effort is normal. No respiratory distress.      Breath sounds: Normal breath sounds.   Abdominal:      General: Bowel sounds are normal.      Palpations: Abdomen is soft.   Musculoskeletal:         General: Normal range of motion.      Cervical back: Normal range of motion and neck supple.        Legs:       Comments: Pain starts in lumbar spine and radiates down into right knee. Negative SLR bilateral with normal DTR bilateral knees. 5/5 bilateral leg strength. No muscle atrophy noted. Can dorsiflex both feet     Skin:     General: Skin is warm.      Capillary Refill: Capillary refill takes less than 2 seconds.   Neurological:      General: No focal deficit present.      Mental Status: She is alert and oriented  to person, place, and time.   Psychiatric:         Attention and Perception: Attention normal.         Mood and Affect: Mood normal.         Speech: Speech normal.         Behavior: Behavior normal.         Thought Content: Thought content normal.         Cognition and Memory: Cognition normal.         Judgment: Judgment normal.             Assessment:       1. Lumbar radiculopathy          Plan:       1. Continue PT. Check MRI due to continued pain despite completing PT       I will contact patient with results when available.    Orders Placed This Encounter    MRI Lumbar spine without contrast

## 2020-05-16 ENCOUNTER — Other Ambulatory Visit (INDEPENDENT_AMBULATORY_CARE_PROVIDER_SITE_OTHER): Payer: Self-pay | Admitting: Family

## 2020-05-18 ENCOUNTER — Encounter (HOSPITAL_COMMUNITY): Payer: 59 | Admitting: Hematology & Oncology

## 2020-05-24 ENCOUNTER — Inpatient Hospital Stay (HOSPITAL_COMMUNITY): Admit: 2020-05-24 | Discharge: 2020-05-24 | Disposition: A | Discharge status: Home / Self Care | Payer: Self-pay

## 2020-05-28 ENCOUNTER — Other Ambulatory Visit (INDEPENDENT_AMBULATORY_CARE_PROVIDER_SITE_OTHER): Payer: Self-pay | Admitting: Family

## 2020-05-28 ENCOUNTER — Encounter (INDEPENDENT_AMBULATORY_CARE_PROVIDER_SITE_OTHER): Payer: Self-pay | Admitting: Family

## 2020-05-28 ENCOUNTER — Telehealth (INDEPENDENT_AMBULATORY_CARE_PROVIDER_SITE_OTHER): Payer: Self-pay

## 2020-05-28 DIAGNOSIS — M5441 Lumbago with sciatica, right side: Secondary | ICD-10-CM

## 2020-05-28 DIAGNOSIS — M5416 Radiculopathy, lumbar region: Secondary | ICD-10-CM

## 2020-05-28 DIAGNOSIS — S34114A Complete lesion of L4 level of lumbar spinal cord, initial encounter: Secondary | ICD-10-CM

## 2020-05-28 DIAGNOSIS — C3491 Malignant neoplasm of unspecified part of right bronchus or lung: Secondary | ICD-10-CM

## 2020-05-28 DIAGNOSIS — G8929 Other chronic pain: Secondary | ICD-10-CM

## 2020-05-28 NOTE — Telephone Encounter (Signed)
Pt spoke to you earlier about imaging results and had a couple of questions. She is still going to physical therapy and wanted to know if she should continue? She also was wondering if the R hip, thigh, and knee pain could me coming from the spinal stenosis. Please advise.

## 2020-05-28 NOTE — Telephone Encounter (Signed)
I spoke to patient told may hold off on PT for now until we get MRI results.    Melody Landry, FNP

## 2020-05-29 ENCOUNTER — Other Ambulatory Visit (INDEPENDENT_AMBULATORY_CARE_PROVIDER_SITE_OTHER): Payer: Self-pay

## 2020-05-29 DIAGNOSIS — M5416 Radiculopathy, lumbar region: Secondary | ICD-10-CM

## 2020-06-06 ENCOUNTER — Encounter (INDEPENDENT_AMBULATORY_CARE_PROVIDER_SITE_OTHER): Payer: Self-pay

## 2020-06-07 ENCOUNTER — Inpatient Hospital Stay (HOSPITAL_COMMUNITY): Admit: 2020-06-07 | Discharge: 2020-06-07 | Disposition: A | Discharge status: Home / Self Care | Payer: Self-pay

## 2020-06-08 ENCOUNTER — Other Ambulatory Visit: Payer: Self-pay

## 2020-06-08 ENCOUNTER — Emergency Department (HOSPITAL_COMMUNITY): Payer: 59

## 2020-06-08 ENCOUNTER — Emergency Department
Admission: EM | Admit: 2020-06-08 | Discharge: 2020-06-08 | Disposition: A | Discharge status: Home / Self Care | Payer: 59 | Attending: Emergency Medicine | Admitting: Emergency Medicine

## 2020-06-08 DIAGNOSIS — Z87891 Personal history of nicotine dependence: Secondary | ICD-10-CM | POA: Insufficient documentation

## 2020-06-08 DIAGNOSIS — M79652 Pain in left thigh: Secondary | ICD-10-CM | POA: Insufficient documentation

## 2020-06-08 DIAGNOSIS — M25559 Pain in unspecified hip: Secondary | ICD-10-CM

## 2020-06-08 NOTE — ED Nurses Note (Signed)
Patient discharged home with family.  AVS reviewed with patient/care giver.  A written copy of the AVS and discharge instructions was given to the patient/care giver.  Questions sufficiently answered as needed.  Patient/care giver encouraged to follow up with PCP as indicated.  In the event of an emergency, patient/care giver instructed to call 911 or go to the nearest emergency room.   No prescriptions given  No work note requested     Current Discharge Medication List      CONTINUE these medications - NO CHANGES were made during your visit.      Details   FISH OIL ORAL   DAILY  Refills: 0     hydrOXYchloroQUINE 200 mg Tablet  Commonly known as: PLAQUENIL   TAKE 2 TABLETS BY MOUTH EVERY DAY  Refills: 0     levothyroxine 88 mcg Tablet  Commonly known as: SYNTHROID   TAKE ONE TABLET BY MOUTH EVERY DAY  Refills: 0     loratadine 10 mg Tablet  Commonly known as: CLARITIN   10 mg, DAILY  Refills: 0     metaxalone 800 mg Tablet  Commonly known as: SKELAXIN   TAKE 1 TABLET BY MOUTH 3 TIMES DAILY AS NEEDED FOR MUSCLE SPASM. DO NOT DRIVE  Refills: 0     MULTIVITAMIN ORAL   Oral, DAILY, Centrum Silver Women's 50+   Refills: 0     NIFEdipine 30 mg Tablet Sustained Release  Commonly known as: ADALAT CC   30 mg, DAILY  Refills: 0     raloxifene 60 mg Tablet  Commonly known as: EVISTA   60 mg, Oral, DAILY  Refills: 0     REFRESH OPTIVE ADVANCED OPHT   Ophthalmic, DAILY PRN  Refills: 0     Stiolto Respimat 2.5-2.5 mcg/actuation Mist  Generic drug: tiotropium-olodateroL   2 INHALATION, Inhalation, DAILY  Qty: 4 g  Refills: 5     TheraTears 0.25 % Drops  Generic drug: carboxymethylcellulose Sodium   1-2 Drops, DAILY PRN  Refills: 0     UNKNOWN MEDICATION  Commonly known as: UNKNOWN MEDICATION   Ophthalmic, [name] 128 Eye Drops OTC 4x daily [name] 128 Eye Gel OTC Nightly   Refills: 0

## 2020-06-08 NOTE — ED Provider Notes (Signed)
Johna Sheriff, DO        [facility]          Emergency Department Visit Note      Name: Gina Barrett  DOB:  01-Dec-1955   Date of Service: 06/08/2020  Room: TR HOLD/TR HOLD  Primary Care Doctor:Christy Peyton Najjar, FNP-C  Patient information was obtained from patient.  History/Exam limitations: none.    Chief Complaint:Hip Pain      HPI:    Gina Barrett is a 64 y.o. female who presents to the Emergency Department with left hip pain.  Patient states that she was walking up the basement stairs into the kitchen and when she reached the last step and stepped up onto the kitchen floor she felt a crack in her left hip and pain shoots down her left leg causing her to fall onto the kitchen floor.  She notes that she did not fall backwards down the steps into the basement.  She states that ever since she has had pain in the left hip.  She states the pain is located in the upper left thigh area.  Primarily in the posterior thigh and buttock region.     Review of Systems:  The pertinent positives and negatives are as per history of present illness. All other systems reviewed, unless otherwise noted are negative.    Past Medical History:  Past Medical History:   Diagnosis Date    Abnormal Pap smear     ASCUS, ASCUS cannot rule out HGSIL    Cancer (CMS HCC)     non small cell lung cancer (R)-s/p Chemo/Radiation completed 8/18    Colon polyp     Dysphagia     Hypothyroid     Non-small cell lung cancer, right (CMS HCC)     s/p Chemo completed 9/18    Raynaud's disease     STD (sexually transmitted disease)     HSV    Unspecified disorder of thyroid     Hypothyroidism           Past Surgical History:  Past Surgical History:   Procedure Laterality Date    HX CATARACT REMOVAL Bilateral 2011    HX COLONOSCOPY  2009    HX ELBOW SURGERY      HX FOOT SURGERY      HX TUBAL LIGATION  1978    LUNG BIOPSY Right 10/02/2016    MEDIASTINOSCOPY  11/13/2016    PB FOREARM/WRIST SURGERY UNLISTED  1999    carpal  tunnel release    PORTACATH PLACEMENT  12/12/2016           Social History:  Social History     Tobacco Use    Smoking status: Former Smoker     Years: 20.00     Types: Cigarettes     Start date: 12/09/1970     Quit date: 09/08/1998     Years since quitting: 21.7    Smokeless tobacco: Never Used   Substance Use Topics    Alcohol use: No    Drug use: No     Social History     Substance and Sexual Activity   Drug Use No       Current Outpatient Medications:       Current Outpatient Medications   Medication Sig    carboxymethyl/glycerin/poly80 (REFRESH OPTIVE ADVANCED OPHT) Apply to eye Once per day as needed    carboxymethylcellulose Sodium (THERATEARS) 0.25 % Ophthalmic Drops 1-2 Drops Once per day as  needed    hydrOXYchloroQUINE (PLAQUENIL) 200 mg Oral Tablet TAKE 2 TABLETS BY MOUTH EVERY DAY    levothyroxine (SYNTHROID) 88 mcg Oral Tablet TAKE ONE TABLET BY MOUTH EVERY DAY    loratadine (CLARITIN) 10 mg Oral Tablet take 10 mg by mouth Once a day.    metaxalone (SKELAXIN) 800 mg Oral Tablet TAKE 1 TABLET BY MOUTH 3 TIMES DAILY AS NEEDED FOR MUSCLE SPASM. DO NOT DRIVE    MULTIVITAMIN ORAL Take by mouth Once a day Centrum Silver Women's 50+    Nifedipine (ADALAT CC) 30 mg Oral Tablet Sustained Release take 30 mg by mouth Once a day.    OMEGA-3 FATTY ACIDS (FISH OIL ORAL) take  by mouth Once a day.    raloxifene (EVISTA) 60 mg Oral Tablet Take 60 mg by mouth Once a day    tiotropium-olodateroL (STIOLTO RESPIMAT) 2.5-2.5 mcg/actuation Inhalation Mist Take 2 INHALATION by inhalation Once a day    UNKNOWN MEDICATION (UNKNOWN MEDICATION) Apply to eye [name] 128 Eye Drops OTC 4x daily  [name] 128 Eye Gel OTC Nightly        Allergies:   Allergies   Allergen Reactions    Penicillins        Physical Exam     Initial Vital Signs:    ED Triage Vitals [06/08/20 1622]   BP (Non-Invasive) (!) 127/55   Heart Rate 88   Respiratory Rate 16   Temperature 36.4 C (97.5 F)   SpO2 97 %   Weight 46.7 kg (103 lb)   Height  1.524 m (5')       Body mass index is 20.12 kg/m.     General: Awake. Alert.  Sitting in a wheelchair, no acute distress.  Head:  Atraumatic     Eyes: Anicteric sclera, noninjected conjunctiva.   ENT:  surgical mask in place, no stridor.    Neck: Neck is supple, nontender, no adenopathy. No nuchal rigidity.   Lungs: Clear to auscultation bilaterally. No wheezes, rales or rhonchi.  no respiratory distress.  Cardiovascular:  Heart is regular without murmurs rubs or gallops   Abdomen:  Soft, nontender, non-distended, normoactive bowel sounds. No peritoneal signs. No pulsatile mass.  Extremities:  Focused exam of the left lower extremity:  There is no gross deformity.  There was tenderness with palpation around the left posterior thigh and left buttock area.  No gross deformity.  With internal and external rotation at the hip she did seem to guard a bit.  She could perform flexion and extension at the hip.  She also was able to flex and extend at the knee against resistance.  There was no tenderness with palpation of the foot and lower leg.  Her left knee was nontender to palpation without any deformity.  The bilateral upper extremities and right lower extremity were without point tenderness and has intact range of motion.  Skin: Skin warm, dry and well-perfused. No petechia or erythema.   Back:  Non-tender to palpation in the midline.    Neurologic: Awake, alert, no lethargy, somnolence or confusion. Moves upper and lower extremities without focal deficits.   Psychiatric: Answers questions appropriately.       Diagnostics     Labs:   No results found for this or any previous visit (from the past 12 hour(s)).   Labs reviewed by me.      Radiology:  XR HIP LEFT W PELVIS 2-3 VIEWS   Final Result      No acute radiographic  findings.                     Radiologist location ID: [id number]         CT FEMUR / THIGH LEFT WO IV CONTRAST    (Results Pending)           ED Course     The problem list, past medical, past surgical,  medication and allergy history reviewed.     Initial orders placed:   Orders Placed This Encounter    XR HIP LEFT W PELVIS 2-3 VIEWS    CT FEMUR / THIGH LEFT WO IV CONTRAST       5:04 PM:  Initial evaluation. A scribe is  not available for this encounter, therefore charting may not be in real-time.       ED Course as of Jun 08 1926   Fri Jun 08, 2020   1831 I reviewed the x-ray findings with patient.  I tried to get her to stand up from the wheelchair and she did and could partially bear weight but she could take a step.  I discussed with her proceeding with a CT of her femur to further evaluate for possible occult fracture.  The patient is agreeable.  I offered her pain medication but she declined.    [EG]      ED Course User Index  [EG] Johna Sheriff, DO       7:26 PM the case is signed out to Dr. Trixie Dredge, emergency medicine, at shift change.  The CT of the left femur/thigh is pending.             Pre-Disposition Vitals:  Vitals:    06/08/20 1622   BP: (!) 127/55   Pulse: 88   Resp: 16   Temp: 36.4 C (97.5 F)   SpO2: 97%   Weight: 46.7 kg (103 lb)   Height: 1.524 m (5')   BMI: 20.16           Impression:    Encounter Diagnosis   Name Primary?    Acute pain of left thigh Yes       Disposition: pending      Condition on Disposition: Stable        Johna Sheriff, DO      This note was written during the COVID-19 pandemic.  Currently an N95 mask is being utilized in the ED.  This mask renders speech recognition software error prone.   If errors are found or the meaning of a statement is unclear, please contact the author for clarification.

## 2020-06-08 NOTE — ED Triage Notes (Signed)
Going up stairs felt like left hip gave out.  Left hip pain

## 2020-06-08 NOTE — Discharge Instructions (Signed)
You have some abnormalities in your bone in your leg that are worrisome and need follow up and possible MRI.  They are called lytic lesions.

## 2020-06-09 NOTE — ED Attending Note (Signed)
1900: I received sign out from Dr. Geoffery Lyons and was asked to follow up on CT results.    CT results with lytic lesions which will require follow up.  I discussed this with the patient.  She has a known history of non-small cell lung cancer and was told she needs to follow up with her oncologist.    Encounter Diagnoses   Name Primary?    Acute pain of left thigh Yes    Hip pain

## 2020-06-13 ENCOUNTER — Encounter (INDEPENDENT_AMBULATORY_CARE_PROVIDER_SITE_OTHER): Payer: Self-pay

## 2020-06-13 ENCOUNTER — Other Ambulatory Visit (INDEPENDENT_AMBULATORY_CARE_PROVIDER_SITE_OTHER): Payer: Self-pay

## 2020-06-13 DIAGNOSIS — G8929 Other chronic pain: Secondary | ICD-10-CM

## 2020-06-13 DIAGNOSIS — M5416 Radiculopathy, lumbar region: Secondary | ICD-10-CM

## 2020-06-13 DIAGNOSIS — C3491 Malignant neoplasm of unspecified part of right bronchus or lung: Secondary | ICD-10-CM

## 2020-06-13 DIAGNOSIS — S34114A Complete lesion of L4 level of lumbar spinal cord, initial encounter: Secondary | ICD-10-CM

## 2020-06-14 ENCOUNTER — Telehealth (INDEPENDENT_AMBULATORY_CARE_PROVIDER_SITE_OTHER): Payer: Self-pay | Admitting: Family

## 2020-06-14 NOTE — Telephone Encounter (Signed)
fyi wanted you to know she took a tumble christmas eve, went to er no broken bones, but a bit banged up, she has an apt already scheduled for jan 3

## 2020-06-18 ENCOUNTER — Encounter (INDEPENDENT_AMBULATORY_CARE_PROVIDER_SITE_OTHER): Payer: Self-pay | Admitting: Family

## 2020-06-18 ENCOUNTER — Telehealth (INDEPENDENT_AMBULATORY_CARE_PROVIDER_SITE_OTHER): Payer: Commercial Managed Care - POS | Admitting: Family

## 2020-06-18 VITALS — Resp 18 | Ht 61.0 in | Wt 103.0 lb

## 2020-06-18 DIAGNOSIS — S34114A Complete lesion of L4 level of lumbar spinal cord, initial encounter: Secondary | ICD-10-CM

## 2020-06-18 DIAGNOSIS — C3491 Malignant neoplasm of unspecified part of right bronchus or lung: Secondary | ICD-10-CM

## 2020-06-18 DIAGNOSIS — Z20822 Contact with and (suspected) exposure to covid-19: Secondary | ICD-10-CM

## 2020-06-18 DIAGNOSIS — R2242 Localized swelling, mass and lump, left lower limb: Secondary | ICD-10-CM

## 2020-06-18 NOTE — Progress Notes (Signed)
Subjective:    Patient ID: Melody Hendricks is a 65 y.o. female.    Consultation for back pain and tachycardia    Verbal consent has been obtained from the patient to conduct a telephone: yes  Telemedicine Documentation Requirements    Originating site (Patient location): home  Distant site (Provider location): [facility] [place]  Provider and Title: Marya Landry, FNP  Consent obtained: YES/NO: Yes  Language, if applicable and if translator was required: [place]    Tele conference lasted 15 minutes     States her back pain is unchanged. She had a MRI with contrast that revealed the following:    4.3 cm soft tissue mass @ right paravertebral region. Radiology suggesting biopsy to rule out metastatic disease.     Patient has an appointment with her oncologist this week.    She unfortunately was taken to [facility] on 06/08/2020 after falling up the step, landing on her left thigh and was  Unable to bear weight. She was taken by ambulance. She had an xray that revealed the following result:   Ill-defined lytic lesion within the left acetabulum and within the left femoral neck. Possibility of metastatic lesions or multiple myeloma cannot be excluded.  States she still cannot not bear weight without pain. Has a lot of support @ home.    States she was going to come to the office today, but changed her appointment due to COVID-19 exposure. States her son tested positive for COVID-19 Monday. She was around him Saturday. She does not have any symptoms and she is fully vaccinated.           The following portions of the patient's history were reviewed and updated as appropriate: allergies, current medications, past family history, past medical history, past social history, past surgical history and problem list.    Review of Systems   Musculoskeletal: Positive for back pain and gait problem.        Left thigh pain   All other systems reviewed and are negative.          Objective:    Physical  Exam  Vitals reviewed.   Psychiatric:         Mood and Affect: Mood normal.         Behavior: Behavior normal.         Thought Content: Thought content normal.         Judgment: Judgment normal.             Assessment:       1. 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 Leg mass, left    3. Non-small cell lung cancer, right    4. Close exposure to COVID-19 virus          Plan:       1-3 Due to patient's history of lung cancer, her lesions are suspicious for metastatic disease. Discussed needs PET scan. Patient wishes to hold off until she speaks with Dr. Charlette Caffey, oncology before going forth with further testing. She also states she plans to hold off on pain management for now.   4. Patient to get COVID-19 testing 5 days post exposure if develops symptoms. Encouraged strict mask protocol per new CDC guidelines.    Follow up in 2 weeks and prn

## 2020-06-21 ENCOUNTER — Telehealth (HOSPITAL_COMMUNITY): Payer: Self-pay

## 2020-06-21 ENCOUNTER — Other Ambulatory Visit (HOSPITAL_COMMUNITY): Payer: Self-pay

## 2020-06-21 NOTE — Telephone Encounter (Signed)
Pt called re COVID exposure. She had to reschedule tomorrow's appt with Dr. Holley Dexter as she is still awaiting results. Pt had recent cT scan in ER after fall on 12/24 Which showed an are of concern. She is asking Dr. Holley Dexter to review CT and would like to get started with tx asap if needed.    Dr. Holley Dexter notified.    Servando Snare  Triage Nurse

## 2020-06-22 ENCOUNTER — Encounter (HOSPITAL_COMMUNITY): Payer: 59 | Admitting: Hematology & Oncology

## 2020-06-25 ENCOUNTER — Telehealth (INDEPENDENT_AMBULATORY_CARE_PROVIDER_SITE_OTHER): Payer: Self-pay

## 2020-06-25 ENCOUNTER — Encounter (INDEPENDENT_AMBULATORY_CARE_PROVIDER_SITE_OTHER): Payer: Self-pay | Admitting: Family

## 2020-06-25 ENCOUNTER — Telehealth (HOSPITAL_COMMUNITY): Payer: Self-pay | Admitting: Hematology & Oncology

## 2020-06-25 NOTE — Telephone Encounter (Signed)
UPDATE:   Per Delma Freeze, Patient is being seen by Dr. Holley Dexter 06/26/2020 @ 10:00am      Patient called and left a voicemail on Gabriele's phone. She stated her pain in her legs is getting worse and she continues taking Ibuprofen. Patient is concerned about mets. Patients last appt with Dr. Holley Dexter was cancelled due to her having COVID. She would like to speak with Dr. Holley Dexter before proceeding with any biopsies. Her next office visit is scheduled for 07/20/20 , but again the patient is very worried/ concerned about mets and pain.     Patient LVM 06/25/2020 @ 10:58am   Patient's call back number: [phone]       This message has been sent to Dr. Holley Dexter, Delma Freeze and Sanjuana Kava.

## 2020-06-25 NOTE — Telephone Encounter (Signed)
Faxed MRI results to Dr. Towanda Malkin.

## 2020-06-25 NOTE — Telephone Encounter (Signed)
I called patient and left a message to discuss    Marya Landry, FNP

## 2020-06-25 NOTE — Telephone Encounter (Signed)
Pt was unable to go see her oncologist to discuss her imaging results. She was wondering if you are going to schedule the biopsy or do you want her to go through her oncologist. The pt is scheduled on 09/17/20 for an appt with her oncologist that was the earliest appt. Please advise.

## 2020-06-26 ENCOUNTER — Ambulatory Visit: Payer: 59 | Attending: Internal Medicine

## 2020-06-26 ENCOUNTER — Encounter (HOSPITAL_COMMUNITY): Payer: Self-pay | Admitting: Hematology & Oncology

## 2020-06-26 ENCOUNTER — Ambulatory Visit (HOSPITAL_BASED_OUTPATIENT_CLINIC_OR_DEPARTMENT_OTHER): Payer: 59 | Admitting: Hematology & Oncology

## 2020-06-26 ENCOUNTER — Other Ambulatory Visit: Payer: Self-pay

## 2020-06-26 ENCOUNTER — Encounter (HOSPITAL_COMMUNITY): Payer: 59 | Admitting: Hematology & Oncology

## 2020-06-26 ENCOUNTER — Ambulatory Visit
Admission: RE | Admit: 2020-06-26 | Discharge: 2020-06-26 | Disposition: A | Discharge status: Home / Self Care | Payer: 59 | Source: Ambulatory Visit | Attending: Hematology & Oncology | Admitting: Hematology & Oncology

## 2020-06-26 VITALS — BP 104/62 | HR 103 | Temp 98.9°F | Resp 16 | Ht 60.98 in | Wt 96.6 lb

## 2020-06-26 DIAGNOSIS — G893 Neoplasm related pain (acute) (chronic): Secondary | ICD-10-CM

## 2020-06-26 DIAGNOSIS — C801 Malignant (primary) neoplasm, unspecified: Secondary | ICD-10-CM | POA: Insufficient documentation

## 2020-06-26 DIAGNOSIS — C3491 Malignant neoplasm of unspecified part of right bronchus or lung: Secondary | ICD-10-CM

## 2020-06-26 DIAGNOSIS — C9000 Multiple myeloma not having achieved remission: Secondary | ICD-10-CM | POA: Insufficient documentation

## 2020-06-26 DIAGNOSIS — C7951 Secondary malignant neoplasm of bone: Secondary | ICD-10-CM

## 2020-06-26 DIAGNOSIS — Z87891 Personal history of nicotine dependence: Secondary | ICD-10-CM | POA: Insufficient documentation

## 2020-06-26 DIAGNOSIS — Z79899 Other long term (current) drug therapy: Secondary | ICD-10-CM | POA: Insufficient documentation

## 2020-06-26 DIAGNOSIS — Z791 Long term (current) use of non-steroidal anti-inflammatories (NSAID): Secondary | ICD-10-CM | POA: Insufficient documentation

## 2020-06-26 DIAGNOSIS — Z681 Body mass index (BMI) 19 or less, adult: Secondary | ICD-10-CM | POA: Insufficient documentation

## 2020-06-26 DIAGNOSIS — R222 Localized swelling, mass and lump, trunk: Secondary | ICD-10-CM

## 2020-06-26 DIAGNOSIS — Z23 Encounter for immunization: Secondary | ICD-10-CM

## 2020-06-26 LAB — CBC WITH DIFF
BASOPHIL #: <0.1 10*3/uL (ref <=0.20)
BASOPHIL %: 1 %
EOSINOPHIL #: <0.1 10*3/uL (ref <=0.50)
EOSINOPHIL %: 0 %
HCT: 35.1 % (ref 34.8–46.0)
HGB: 11.6 g/dL (ref 11.5–16.0)
IMMATURE GRANULOCYTE #: <0.1 10*3/uL (ref <0.10)
IMMATURE GRANULOCYTE %: 1 % (ref 0–1)
LYMPHOCYTE #: 1.02 10*3/uL (ref 1.00–4.80)
LYMPHOCYTE %: 14 %
MCH: 29.4 pg (ref 26.0–32.0)
MCHC: 33 g/dL (ref 31.0–35.5)
MCV: 88.9 fL (ref 78.0–100.0)
MONOCYTE #: 0.88 10*3/uL (ref 0.20–1.10)
MONOCYTE %: 12 %
MPV: 10.1 fL (ref 8.7–12.5)
NEUTROPHIL #: 5.48 10*3/uL (ref 1.50–7.70)
NEUTROPHIL %: 72 %
PLATELETS: 332 10*3/uL (ref 150–400)
RBC: 3.95 10*6/uL (ref 3.85–5.22)
RDW-CV: 13.2 % (ref 11.5–15.5)
WBC: 7.5 10*3/uL (ref 3.7–11.0)

## 2020-06-26 MED ORDER — SENNOSIDES 8.6 MG-DOCUSATE SODIUM 50 MG TABLET
1.00 | ORAL_TABLET | Freq: Every evening | ORAL | 1 refills | Status: DC
Start: 2020-06-26 — End: 2021-01-08

## 2020-06-26 MED ORDER — OXYCODONE-ACETAMINOPHEN 10 MG-325 MG TABLET
1.00 | ORAL_TABLET | Freq: Four times a day (QID) | ORAL | 0 refills | Status: AC | PRN
Start: 2020-06-26 — End: 2020-07-26

## 2020-06-26 NOTE — Progress Notes (Unsigned)
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  I last saw patient in clinic about 6 months ago (June 2021) and she reports the following:    -developed right lower back pain about 4 months ago and was evaluated by her primary care physician who ordered MRI for further evaluation that was denied by her insurance company.     -she was managed with NSAIDs and referred to physical therapy in October 2021 which did not improve her pain although NSAIDs helped.  She was also treated with prednisone taper.    -with no benefit from physical therapy, MRI without contrast was authorized which was completed her progressive Radiology in [place] and found to be abnormal including demonstration of right L4 paraspinal mass.Marland Kitchen  MRI with contrast was requested.    -MRI lumbar spine with IV contrast completed a progressive Radiology on 06/07/2020 showed a 4.3 cm soft tissue mass in the right paravertebral region at the level of L4 vertebrae with extension into the adjacent spine and erosion into the adjacent right transverse process with extension into the posterior vertebral element of L4 vertebrae.  There is no signs of gross extension into the adjacent right neural foramen or right side of the spinal canal.  Metastatic disease from previously diagnosed malignancy was suspected.  The imaging study did not extend to the hips and nothing was reported in this regard    -patient presented to the emergency room on 1  06/08/2020 after falling at home, hearing left hip crack and developing left hip pain.  X-ray in the emergency room showed osteopenia without any fractures.  CT of left femur and thigh without IV contrast demonstrated an ill-defined lytic lesion within the posterior acetabulum measuring 4 x 2.5 x 3.7 cm in addition to an ovoid lytic lesion within the left femoral neck measuring 2 x 1.5 cm.  No definitive acute fracture was identified.    -she reports weight loss even though her appetite and quantity of food intake has not changed.  She has become more deconditioned.  She is taking Motrin 400 mg every 4-6 hours and has difficulty sleeping at night because of not finding a comfortable position.  She denies any cough, chest pain, or dyspnea.  She has no bowel or bladder incontinence.  She has no breast symptoms and her mammogram is up-to-date.  She denies any vaginal discharge or pelvic  Additional review of symptoms are noted below.    ROS:     Review of Systems   Constitutional: Negative for appetite change, chills, fatigue, fever and unexpected weight change.   HENT:   Negative for lump/mass.    Respiratory: Negative for chest tightness, cough, shortness of breath and wheezing.    Cardiovascular: Negative for chest pain, leg swelling and palpitations.   Gastrointestinal: Negative.    Genitourinary: Positive for pelvic pain. Negative for bladder incontinence, difficulty urinating, vaginal bleeding and vaginal discharge.    Musculoskeletal: Positive for back pain (  starts in low back and radiates down both legs; R>L 8/10 today) and gait problem (due to back and leg pain).   Neurological: Positive for gait problem (due to back and leg pain). Negative for headaches.   Hematological: Negative for adenopathy.   All other systems reviewed and are negative.     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  -PET-CT scan on 09/06/2016 showed 10 mm right upper lobe FDG avid lesion that is slightly larger than 2017 CT and concerning for malignancy.  A separate 3 cm long area of tracer uptake in the left lower lobe was noted.  -CT-guided biopsy a [facility] on 10/02/2016 was positive for adenocarcinoma that is well to moderately differentiated.  -Mediastinoscopy 11/13/2016 positive for left mid paratrachael    Stage: IIIB     Molecular/special studies:  unknown    Treatment & Surveillance:   1. S/p chemoradiation with carboplatin + paclitaxel + RT 66.6 Gy completed 02/10/19/18    2. Serial imaging since completion of therapy has been negative for recurrence.  Most recent imaging in on 12/03/2018 was also negative but showed a new T6 compression abnormality.     3. Chest CT with contrast from 09/23/2019 which in summary showed increasing pleural based density in the LLL which may be infectious or inflammatory.  There is increased 1.7cm LN in left hilum ans stable compression abnormality of T6.    4. Biopsy of left LL pleural bases mass on 11/02/2019 showed benign fibrovascular tissue with focal chronic inflammation.  no lung parechyma or malgnancy identified.    5.     ________________________________________________________________________________________________________________________________________________     Objective   Objective:   BP 104/62 (Site: Left, Patient Position: Sitting)   Pulse (!) 103   Temp 37.2 C (98.9 F) (Thermal Scan)   Resp 16   Ht 1.549 m (5' 0.98") Comment: *  Wt 43.8 kg (96 lb 9.6 oz)   BMI 18.26 kg/m       ECOG Status: 3 - Capable of only limited selfcare, confined to bed or chair  more than 50% of waking hour.  Physical Exam  Constitutional:       General: She is not in acute distress.     Appearance: She is not ill-appearing.   Eyes:      Conjunctiva/sclera: Conjunctivae normal.   Neck:      Comments: No palpable superficial axillary lymph nodes  Cardiovascular:      Rate and Rhythm: Normal rate and regular rhythm.      Heart sounds: Normal heart sounds. No murmur heard.   No friction rub. No gallop.    Pulmonary:      Effort: Pulmonary effort is normal. No respiratory distress.      Breath sounds: Normal breath sounds.   Abdominal:      Palpations: Abdomen is soft. There is no mass.      Tenderness: There is no abdominal tenderness. There is no guarding.   Musculoskeletal:         General: Tenderness present. No swelling.      Thoracic back: No tenderness.      Lumbar back: Tenderness present. No bony tenderness. Normal range of motion.  Right lower leg: No edema.      Left lower leg: No edema.   Lymphadenopathy:      Head:      Right side of head: No submental or submandibular adenopathy.      Left side of head: No submental or submandibular adenopathy.      Cervical: No cervical adenopathy.      Right cervical: No superficial cervical adenopathy.     Left cervical: No superficial cervical adenopathy.      Upper Body:      Right upper body: No supraclavicular or axillary adenopathy.      Left upper body: No supraclavicular or axillary adenopathy.   Skin:     Coloration: Skin is not pale.      Findings: No rash.   Neurological:      Mental Status: She is alert and oriented to person, place, and time.      Cranial Nerves: No cranial nerve deficit.      Sensory: No sensory deficit.      Motor: Weakness present.      Gait: Gait abnormal.      Comments: bilateral LE weakness mostly due to pain. Gait abnormal due to pain.   Psychiatric:         Mood and Affect: Mood and affect normal.          Past Medical History:   Diagnosis Date   . Abnormal Pap smear     ASCUS, ASCUS cannot rule out  HGSIL   . Cancer (CMS HCC)     non small cell lung cancer (R)-s/p Chemo/Radiation completed 8/18   . Colon polyp    . Dysphagia    . Hypothyroid    . Non-small cell lung cancer, right (CMS [facility])     s/p Chemo completed 9/18   . Raynaud's disease    . STD (sexually transmitted disease)     HSV   . Unspecified disorder of thyroid     Hypothyroidism         Current Outpatient Medications   Medication Sig   . Artifi. Tear, Hypromellose,-PF (RETAINE [facility], PF,) 0.3 % Ophthalmic Drops Administer into affected eye(s) Once per day as needed   . carboxymethyl/glycerin/poly80 (REFRESH OPTIVE ADVANCED OPHT) Apply to eye Once per day as needed   . carboxymethylcellulose Sodium (THERATEARS) 0.25 % Ophthalmic Drops 1-2 Drops Once per day as needed   . hydrOXYchloroQUINE (PLAQUENIL) 200 mg Oral Tablet TAKE 2 TABLETS BY MOUTH EVERY DAY   . Ibuprofen (MOTRIN) 200 mg Oral Tablet Take 400 mg by mouth Twice daily   . levothyroxine (SYNTHROID) 88 mcg Oral Tablet TAKE ONE TABLET BY MOUTH EVERY DAY   . loratadine (CLARITIN) 10 mg Oral Tablet take 10 mg by mouth Once a day.   . MULTIVITAMIN ORAL Take by mouth Once a day Centrum Silver Women's 50+   . nebivoloL (BYSTOLIC) 5 mg Oral Tablet TAKE ONE TABLET BY MOUTH EVERY DAY   . Nifedipine (ADALAT CC) 30 mg Oral Tablet Sustained Release take 30 mg by mouth Once a day.   . OMEGA-3 FATTY ACIDS (FISH OIL ORAL) take  by mouth Once a day.   . oxyCODONE-acetaminophen (PERCOCET) 10-325 mg Oral Tablet Take 1 Tablet by mouth Every 6 hours as needed for Pain for up to 30 days   . raloxifene (EVISTA) 60 mg Oral Tablet Take 60 mg by mouth Once a day   . sennosides-docusate sodium (SENOKOT-S) 8.6-50 mg Oral Tablet Take 1  Tablet by mouth Every evening   . tiotropium-olodateroL (STIOLTO RESPIMAT) 2.5-2.5 mcg/actuation Inhalation Mist Take 2 INHALATION by inhalation Once a day   . UNKNOWN MEDICATION (UNKNOWN MEDICATION) Apply to eye [name] 128 Eye Drops OTC 4x daily  [name] 128 Eye Gel OTC Nightly     Allergies  as of 06/26/2020 - Reviewed 06/26/2020   Allergen Reaction Noted   . Penicillins  08/15/2010       Social History  Occupation:   Social History     Occupational History     Employer: [place] [place] [id number]   reports that she quit smoking about 21 years ago. Her smoking use included cigarettes. She started smoking about 49 years ago. She quit after 20.00 years of use. She has never used smokeless tobacco. She reports being sexually active and has had partner(s) who are female. She reports using the following method of birth control/protection: None. She reports that she does not drink alcohol and does not use drugs.    Labs:  Labs reviewed and interpreted:  Results for orders placed or performed during the hospital encounter of 06/26/20 (from the past 96 hour(s))   ELECTROPHORESIS, PROTEIN, SERUM    Narrative    The following orders were created for panel order ELECTROPHORESIS, PROTEIN, SERUM.  Procedure                               Abnormality         Status                     ---------                               -----------         ------                     PROTEIN ELECTROPHORESIS,.Marland KitchenMarland Kitchen[[id number]]                      In process                 ALBUMIN FOR ELECTROPHORESIS[[id number]]                      In process                 PROTEIN FOR ELECTROPHORESIS[[id number]]                      In process                   Please view results for these tests on the individual orders.   CBC/DIFF    Narrative    The following orders were created for panel order CBC/DIFF.  Procedure                               Abnormality         Status                     ---------                               -----------         ------  CBC WITH TAVW[[id number]]                                    Final result                 Please view results for these tests on the individual orders.   CBC WITH DIFF   Result Value Ref Range    WBC 7.5 3.7 - 11.0 x10^3/uL    RBC 3.95 3.85 - 5.22  x10^6/uL    HGB 11.6 11.5 - 16.0 g/dL    HCT 35.1 34.8 - 46.0 %    MCV 88.9 78.0 - 100.0 fL    MCH 29.4 26.0 - 32.0 pg    MCHC 33.0 31.0 - 35.5 g/dL    RDW-CV 13.2 11.5 - 15.5 %    PLATELETS 332 150 - 400 x10^3/uL    MPV 10.1 8.7 - 12.5 fL    NEUTROPHIL % 72 %    LYMPHOCYTE % 14 %    MONOCYTE % 12 %    EOSINOPHIL % 0 %    BASOPHIL % 1 %    NEUTROPHIL # 5.48 1.50 - 7.70 x10^3/uL    LYMPHOCYTE # 1.02 1.00 - 4.80 x10^3/uL    MONOCYTE # 0.88 0.20 - 1.10 x10^3/uL    EOSINOPHIL # <0.10 <=0.50 x10^3/uL    BASOPHIL # <0.10 <=0.20 x10^3/uL    IMMATURE GRANULOCYTE % 1 0 - 1 %    IMMATURE GRANULOCYTE # <0.10 <0.10 x10^3/uL       Radiology: All recent pertinent radiologic images and reports reviewed      ________________________________________________________________________________________________________________________________________________     Assessment/Plan:     1. Non-small cell lung cancer, right (CMS [facility])  65 year old woman with stage III non-small cell lung cancer diagnosed about 5 years ago and treated as above.  Her last surveillance CT imaging let me to biopsy of increasing pleural-based left lower lobe lesion which was biopsied and negative.  She now has both paraspinal soft tissue and bony metastasis concerning for metastatic disease with differential consideration including metastasis from her known lung cancer as well as new primary malignancy.  Multiple myeloma although mentioned is not favored but will be investigated.    I reviewed the details of all her imaging studies most of which are known to her already.  With her daughter, Carloyn Jaeger on the phone, I discussed next diagnostic steps in her evaluation to include biopsy of her right paraspinal soft tissue, CT chest abdomen and pelvis to evaluate for extensive disease including progression of pulmonary disease as well as laboratory testing that will also rule out multiple myeloma.  We will expeditiously moved accomplish this and get her to treatment in the  next 1-2 weeks.  Meantime, we needed to address her pain and recommended less use of NSAIDs to avoid both GI bleed and renal dysfunction.  She obviously has reasons for pain and I have recommended use of opioids starting with Percocet 10 mg/325 mg which can be taking only at night but can be used every 6 hours as needed for pain.  Will standby to do more to manage her pain.  Stool softeners has also been prescribed.  - CT CHEST ABDOMEN PELVIS W IV CONTRAST; Future    2. 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  -  CBC/DIFF; Future  - BETA-2 MICROGLOBULIN (B2M), SERUM; Future  - KAPPA AND LAMBDA FREE LIGHT CHAINS, SERUM; Future  - IMMUNOGLOBULIN G (IGG), SERUM; Future  - IMMUNOGLOBULIN A (IGA), SERUM; Future  - IMMUNOGLOBULIN M (IGM), SERUM; Future  - ELECTROPHORESIS, PROTEIN, SERUM; Future  - CT CHEST ABDOMEN PELVIS W IV CONTRAST; Future    3. Paravertebral mass  -I will review imaging studies with Neurosurgery.  - CT CHEST ABDOMEN PELVIS W IV CONTRAST; Future    4. Cancer associated pain  - per #1 abovbe  - oxyCODONE-acetaminophen (PERCOCET) 10-325 mg Oral Tablet; Take 1 Tablet by mouth Every 6 hours as needed for Pain for up to 30 days  Dispense: 120 Tablet; Refill: 0      Return in about 2 weeks (around 07/10/2020).    Mertie Moores, MD      CC:  PCP General:  Gina Ensign, FNP-C  [facility] [address]  [place] [id number]      Portions of this note may be dictated using voice recognition software or a dictation service. Variances in spelling and vocabulary are possible and unintentional. Not all errors are caught/corrected. Please notify the [facility] if any discrepancies are noted or if the meaning of any statement is not clear.

## 2020-06-26 NOTE — Progress Notes (Signed)
   Covid-19 Vaccination Clinic  Name:  Julia Ware    MRN: [id number] DOB: January 30, 1956  06/26/2020  Julia Ware was observed post Covid-19 immunization for 15 minutes without incident. She was provided with Vaccine Information Sheet and instruction to access the V-Safe system.   Julia Ware was instructed to call 911 with any severe reactions post vaccine: Marland Kitchen Difficulty breathing  . Swelling of face and throat  . A fast heartbeat  . A bad rash all over body  . Dizziness and weakness   Immunizations Administered    Name Date Dose VIS Date Route   Moderna Covid-19 Booster Vaccine 06/26/2020  1:52 PM 0.25 mL 04/04/2020 Intramuscular   Manufacturer: Moderna   Lot: [id number]   [place]: [phone]

## 2020-06-27 ENCOUNTER — Encounter (HOSPITAL_COMMUNITY): Payer: Self-pay

## 2020-06-27 ENCOUNTER — Encounter (HOSPITAL_COMMUNITY): Payer: Self-pay | Admitting: Hematology & Oncology

## 2020-06-27 LAB — PROTEIN FOR ELECTROPHORESIS: PROTEIN TOTAL: 7 g/dL (ref 5.6–7.6)

## 2020-06-27 LAB — ALBUMIN FOR ELECTROPHORESIS: ALBUMIN: 3.3 g/dL — ABNORMAL LOW (ref 3.4–4.8)

## 2020-06-27 NOTE — Addendum Note (Signed)
Addended by: Sherolyn Buba on: 06/27/2020 09:38 AM     Modules accepted: Orders

## 2020-06-28 ENCOUNTER — Other Ambulatory Visit (HOSPITAL_COMMUNITY): Payer: Self-pay

## 2020-06-28 DIAGNOSIS — C3491 Malignant neoplasm of unspecified part of right bronchus or lung: Secondary | ICD-10-CM

## 2020-06-28 LAB — IMMUNOGLOBULIN G (IGG), SERUM: IMMUNOGLOBULIN G (IGG): 1581 mg/dL (ref 610–1616)

## 2020-06-28 LAB — PROTEIN ELECTROPHORESIS, SERUM (SPEP)
ALBUMIN: 3.3 g/dL — ABNORMAL LOW (ref 3.4–4.8)
PATHOLOGIST INTERPRETATION SPEP: ABNORMAL — AB
PROTEIN TOTAL: 7 g/dL (ref 5.6–7.6)

## 2020-06-28 LAB — BETA-2 MICROGLOBULIN (B2M), SERUM: BETA-2 MICROGLOBULIN, SERUM: 3.8 ug/mL — ABNORMAL HIGH (ref 0.80–2.34)

## 2020-06-28 LAB — IMMUNOGLOBULIN M (IGM), SERUM: IMMUNOGLOBULIN M (IGM): 66 mg/dL (ref 35–242)

## 2020-06-28 LAB — KAPPA AND LAMBDA FREE LIGHT CHAINS, SERUM
KAPPA FREE LIGHT CHAINS: 4.31 mg/dL — ABNORMAL HIGH (ref 0.33–1.94)
KAPPA/LAMBDA FLC RATIO: 1.73 — ABNORMAL HIGH (ref 0.26–1.65)
LAMBDA FREE LIGHT CHAINS: 2.49 mg/dL (ref 0.57–2.63)

## 2020-06-28 LAB — IMMUNOGLOBULIN A (IGA), SERUM: IMMUNOGLOBULIN A (IGA): 360 mg/dL (ref 85–499)

## 2020-06-29 ENCOUNTER — Telehealth (HOSPITAL_COMMUNITY): Payer: Self-pay

## 2020-06-29 NOTE — Telephone Encounter (Signed)
Pt called asking if she can take her pain medication the night before her biopsy. Per Dr. Holley Dexter, she can. Pain med contains Tylenol, not Ibuprofen.    Servando Snare  Triage Nurse

## 2020-06-30 ENCOUNTER — Other Ambulatory Visit: Payer: Self-pay

## 2020-06-30 ENCOUNTER — Ambulatory Visit: Payer: 59 | Attending: Hematology & Oncology

## 2020-06-30 DIAGNOSIS — C3491 Malignant neoplasm of unspecified part of right bronchus or lung: Secondary | ICD-10-CM | POA: Insufficient documentation

## 2020-06-30 LAB — CREATININE
CREATININE: 0.77 mg/dL (ref 0.60–1.05)
ESTIMATED GFR: 81 mL/min/BSA (ref >=60)

## 2020-06-30 LAB — BUN: BUN: 10 mg/dL (ref 8–25)

## 2020-07-01 MED ORDER — IOPAMIDOL 370 MG IODINE/ML (76 %) INTRAVENOUS SOLUTION
100.0000 mL | INTRAVENOUS | Status: AC
Start: 2020-07-02 — End: 2020-07-02
  Administered 2020-07-02: 43 mL via INTRAVENOUS
  Filled 2020-07-01: qty 100

## 2020-07-01 MED ORDER — GASTROVIEW 15 ML IN 500 ML SW ORAL SOLUTION - CHI
15.0000 mL | ORAL | Status: AC
Start: 2020-07-01 — End: 2020-07-02
  Administered 2020-07-02: 15 mL via ORAL

## 2020-07-02 ENCOUNTER — Ambulatory Visit
Admission: RE | Admit: 2020-07-02 | Discharge: 2020-07-02 | Disposition: A | Discharge status: Home / Self Care | Payer: 59 | Source: Ambulatory Visit | Attending: Diagnostic Radiology | Admitting: Diagnostic Radiology

## 2020-07-02 ENCOUNTER — Other Ambulatory Visit: Payer: Self-pay

## 2020-07-02 DIAGNOSIS — C7951 Secondary malignant neoplasm of bone: Secondary | ICD-10-CM | POA: Insufficient documentation

## 2020-07-02 DIAGNOSIS — R222 Localized swelling, mass and lump, trunk: Secondary | ICD-10-CM | POA: Insufficient documentation

## 2020-07-02 DIAGNOSIS — C801 Malignant (primary) neoplasm, unspecified: Secondary | ICD-10-CM | POA: Insufficient documentation

## 2020-07-02 DIAGNOSIS — C3491 Malignant neoplasm of unspecified part of right bronchus or lung: Secondary | ICD-10-CM | POA: Insufficient documentation

## 2020-07-03 ENCOUNTER — Other Ambulatory Visit (HOSPITAL_COMMUNITY): Payer: Self-pay

## 2020-07-04 ENCOUNTER — Ambulatory Visit
Admission: RE | Admit: 2020-07-04 | Discharge: 2020-07-04 | Disposition: A | Discharge status: Home / Self Care | Payer: 59 | Source: Ambulatory Visit | Attending: Hematology & Oncology | Admitting: Hematology & Oncology

## 2020-07-04 ENCOUNTER — Other Ambulatory Visit (HOSPITAL_COMMUNITY): Payer: Self-pay | Admitting: Hematology & Oncology

## 2020-07-04 ENCOUNTER — Other Ambulatory Visit: Payer: Self-pay

## 2020-07-04 DIAGNOSIS — C3491 Malignant neoplasm of unspecified part of right bronchus or lung: Secondary | ICD-10-CM

## 2020-07-04 DIAGNOSIS — R222 Localized swelling, mass and lump, trunk: Secondary | ICD-10-CM

## 2020-07-04 DIAGNOSIS — C7951 Secondary malignant neoplasm of bone: Secondary | ICD-10-CM

## 2020-07-04 DIAGNOSIS — C801 Malignant (primary) neoplasm, unspecified: Secondary | ICD-10-CM

## 2020-07-04 DIAGNOSIS — C7989 Secondary malignant neoplasm of other specified sites: Secondary | ICD-10-CM

## 2020-07-04 MED ORDER — LIDOCAINE HCL 10 MG/ML (1 %) INJECTION SOLUTION
Freq: Once | INTRAMUSCULAR | Status: AC | PRN
Start: 2020-07-04 — End: 2020-07-04
  Administered 2020-07-04 (×2): 5 mL via INTRADERMAL

## 2020-07-04 MED ORDER — LIDOCAINE HCL 10 MG/ML (1 %) INJECTION SOLUTION
INTRAMUSCULAR | Status: AC
Start: 2020-07-04 — End: 2020-07-04
  Filled 2020-07-04: qty 20

## 2020-07-06 ENCOUNTER — Other Ambulatory Visit (HOSPITAL_COMMUNITY): Payer: Self-pay

## 2020-07-06 ENCOUNTER — Ambulatory Visit: Payer: 59 | Attending: Hematology & Oncology | Admitting: Hematology & Oncology

## 2020-07-06 ENCOUNTER — Other Ambulatory Visit: Payer: Self-pay

## 2020-07-06 ENCOUNTER — Encounter (HOSPITAL_COMMUNITY): Payer: Self-pay | Admitting: Hematology & Oncology

## 2020-07-06 ENCOUNTER — Ambulatory Visit (HOSPITAL_COMMUNITY): Payer: 59 | Admitting: Hematology & Oncology

## 2020-07-06 VITALS — BP 112/67 | HR 123 | Temp 97.7°F | Resp 28 | Ht 60.98 in | Wt 94.4 lb

## 2020-07-06 DIAGNOSIS — C3491 Malignant neoplasm of unspecified part of right bronchus or lung: Secondary | ICD-10-CM | POA: Insufficient documentation

## 2020-07-06 DIAGNOSIS — R222 Localized swelling, mass and lump, trunk: Secondary | ICD-10-CM

## 2020-07-06 DIAGNOSIS — Z452 Encounter for adjustment and management of vascular access device: Secondary | ICD-10-CM | POA: Insufficient documentation

## 2020-07-06 DIAGNOSIS — Z9189 Other specified personal risk factors, not elsewhere classified: Secondary | ICD-10-CM

## 2020-07-06 DIAGNOSIS — C7951 Secondary malignant neoplasm of bone: Secondary | ICD-10-CM

## 2020-07-06 DIAGNOSIS — Z880 Allergy status to penicillin: Secondary | ICD-10-CM | POA: Insufficient documentation

## 2020-07-06 DIAGNOSIS — Z9151 Personal history of suicidal behavior: Secondary | ICD-10-CM | POA: Insufficient documentation

## 2020-07-06 DIAGNOSIS — Z923 Personal history of irradiation: Secondary | ICD-10-CM | POA: Insufficient documentation

## 2020-07-06 DIAGNOSIS — Z79899 Other long term (current) drug therapy: Secondary | ICD-10-CM | POA: Insufficient documentation

## 2020-07-06 DIAGNOSIS — Z791 Long term (current) use of non-steroidal anti-inflammatories (NSAID): Secondary | ICD-10-CM | POA: Insufficient documentation

## 2020-07-06 DIAGNOSIS — Z87891 Personal history of nicotine dependence: Secondary | ICD-10-CM | POA: Insufficient documentation

## 2020-07-06 LAB — SURGICAL PATHOLOGY SPECIMEN

## 2020-07-06 NOTE — Progress Notes (Addendum)
Return Patient Progress Note    Date: 07/06/2020    Name: Gina Barrett  MRN: [id number]  DOB: [birth year]   Referring Physician: Self, Referral  Primary Care Provider: Lanier Ensign, FNP-C    Reason for visit/consultation Lung Cancer (F/U: NSCLC) and Results (CT CAP; Paraspinal Mass Bx Results)      Interval History:   Patient returns to continued evaluation recent concern for metastatic disease recurs.  She had IR biopsy completed on 07/04/2020.   Her 2 daughters joined me by phone for this conversation.  Her review systems as noted below.      ROS:     Review of Systems   Musculoskeletal: Positive for back pain (8/10 today low back, right hip down leg to knee).   All other systems reviewed and are negative.     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  -PET-CT scan on 09/06/2016 showed 10 mm right upper lobe FDG avid lesion that is slightly larger than 2017 CT and concerning for malignancy.  A separate 3 cm long area of tracer uptake in the left lower lobe was noted.  -CT-guided biopsy a [facility] on 10/02/2016 was positive for adenocarcinoma that is well to moderately differentiated.  -Mediastinoscopy 11/13/2016 positive for left mid paratrachael    Stage: IIIB     Molecular/special studies:  unknown    Treatment & Surveillance:   1. S/p chemoradiation with carboplatin + paclitaxel + RT 66.6 Gy completed 02/10/19/18    2. Serial imaging since completion of therapy has been negative for recurrence.  Most recent imaging in on 12/03/2018 was also negative but showed a new T6 compression  abnormality.     3. Chest CT with contrast from 09/23/2019 which in summary showed increasing pleural based density in the LLL which may be infectious or inflammatory.  There is increased 1.7cm LN in left hilum ans stable compression abnormality of T6.    4. Biopsy of left LL pleural bases mass on 11/02/2019 showed benign fibrovascular tissue with focal chronic inflammation.  no lung parechyma or malgnancy identified.    5. Patient developed progressive musculoskeletal pain around October /November of 2021 evaluation with multiple imaging studies highly suggest metastatic disease.  Evaluation for plasma cell was noncontributory.    6. Biopsy of paraspinal soft tissue mass on 07/04/2020 confirmed metastatic carcinoma consistent with lung primary.    7. Resumption of chemotherapy is planned after MediPort placement.  Bisphosphonate his planned after dental evaluation.    8. Patient was referred to Radiation Oncology for radiation to the acetabulum as well as the paraspinal mass.    9. Patient was referred to orthopedic surgery (Dr. Hervey Barrett, by family request and known to patient)  for surgical recommendation especially regarding weight-bearing.    ________________________________________________________________________________________________________________________________________________     Objective   Objective:   BP 112/67 (Site: Left, Patient Position: Sitting)    Pulse (!) 123    Temp 36.5 C (97.7 F) (Thermal Scan)    Resp (!) 28    Ht 1.549 m (5' 0.98") Comment: *   Wt 42.8 kg (94 lb 6.4 oz)    BMI 17.85 kg/m       ECOG Status: 3 - Capable  of only limited selfcare, confined to bed or chair more than 50% of waking hour.  Physical Exam     Past Medical History:   Diagnosis Date    Abnormal Pap smear     ASCUS, ASCUS cannot rule out HGSIL    Cancer (CMS HCC)     non small cell lung cancer (R)-s/p Chemo/Radiation completed 8/18    Colon polyp     Dysphagia     Hypothyroid     Non-small cell lung cancer,  right (CMS HCC)     s/p Chemo completed 9/18    Raynaud's disease     STD (sexually transmitted disease)     HSV    Unspecified disorder of thyroid     Hypothyroidism         Current Outpatient Medications   Medication Sig    Artifi. Tear, Hypromellose,-PF (RETAINE HP[facility], PF,) 0.3 % Ophthalmic Drops Administer into affected eye(s) Once per day as needed    carboxymethyl/glycerin/poly80 (REFRESH OPTIVE ADVANCED OPHT) Apply to eye Once per day as needed    carboxymethylcellulose Sodium (THERATEARS) 0.25 % Ophthalmic Drops 1-2 Drops Once per day as needed    hydrOXYchloroQUINE (PLAQUENIL) 200 mg Oral Tablet TAKE 2 TABLETS BY MOUTH EVERY DAY    Ibuprofen (MOTRIN) 200 mg Oral Tablet Take 400 mg by mouth Twice daily    levothyroxine (SYNTHROID) 88 mcg Oral Tablet TAKE ONE TABLET BY MOUTH EVERY DAY    loratadine (CLARITIN) 10 mg Oral Tablet take 10 mg by mouth Once a day.    MULTIVITAMIN ORAL Take by mouth Once a day Centrum Silver Women's 50+    nebivoloL (BYSTOLIC) 5 mg Oral Tablet TAKE ONE TABLET BY MOUTH EVERY DAY    Nifedipine (ADALAT CC) 30 mg Oral Tablet Sustained Release take 30 mg by mouth Once a day.    OMEGA-3 FATTY ACIDS (FISH OIL ORAL) take  by mouth Once a day.    oxyCODONE-acetaminophen (PERCOCET) 10-325 mg Oral Tablet Take 1 Tablet by mouth Every 6 hours as needed for Pain for up to 30 days    raloxifene (EVISTA) 60 mg Oral Tablet Take 60 mg by mouth Once a day    sennosides-docusate sodium (SENOKOT-S) 8.6-50 mg Oral Tablet Take 1 Tablet by mouth Every evening    tiotropium-olodateroL (STIOLTO RESPIMAT) 2.5-2.5 mcg/actuation Inhalation Mist Take 2 INHALATION by inhalation Once a day    UNKNOWN MEDICATION (UNKNOWN MEDICATION) Apply to eye [name] 128 Eye Drops OTC 4x daily  [name] 128 Eye Gel OTC Nightly     Allergies as of 07/06/2020 - Reviewed 07/06/2020   Allergen Reaction Noted    Penicillins  08/15/2010       Social History  Occupation:   Social History     Occupational History      Employer: [place]: [place] [address]   reports that she quit smoking about 21 years ago. Her smoking use included cigarettes. She started smoking about 49 years ago. She quit after 20.00 years of use. She has never used smokeless tobacco. She reports being sexually active and has had partner(s) who are female. She reports using the following method of birth control/protection: None. She reports that she does not drink alcohol and does not use drugs.    Labs:  Labs reviewed and interpreted:  SURGICAL PATHOLOGY SPECIMEN: [id number]  Order: [id number]   Status: Final result    Visible to patient: Yes (not seen)    Next appt: 07/20/2020 at  12:00 PM in Hematology & Oncology Mertie Moores, MD)    0 Result Notes    Component    Final Diagnosis   SOFT TISSUE, RIGHT PARASPINAL MASS, BIOPSY:  -          Metastatic poorly differentiated adenocarcinoma consistent with lung primary.   Electronically signed by Camillia Herter, MD on 07/06/2020 at 1135   Diagnosis Comment    The biopsy shows metastatic poorly differentiated non-small cell carcinoma involving trabecular bone.  Neoplastic cells are positive for CK7 and TTF-1 while negative for p40.  The findings are consistent with metastatic poorly differentiated adenocarcinoma, and the immunophenotype is consistent with lung primary.              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 Mildly prominent lymph nodes in the pretracheal region adjacent to the aortic arch and in the aortopulmonic window. These are slightly increased from 04/05/2019. There is marked thickening at the right lung apex with upward retraction of the right hilum. This is minimally increased from 04/05/2019.? Related to prior radiation therapy there are scattered areas of mildly increased markings throughout right lung fairly similar to 04/05/2019. The left side there is fairly marked thickening of the major fissure new from 04/05/2019. There are at least 12 pulmonary nodules in the left lower lobe that are new from the prior exam the largest is 1 cm. I suspect that this represents metastatic disease. There are 2 areas of irregular density in the left upper lobe one medially that is pleural-based and lung laterally that is pleural-based largest is about 1.8 cm. These have minimally increased since the prior study but may represent scarring.    There is a 1 cm lucent lesion in the posterior aspect of C7 (series 7 image 42 and series 3 image 5) worrisome for a bone metastasis. There is mild to moderate compression of T6 which is stable from 04/05/2019. There is a fracture of the posterior aspect of the right ninth rib which is recent and is suspicious for a pathologic fracture.    IMPRESSION:  Suspect multiple lung metastasis left lower lobe new from 04/05/2019 largest 1 cm.    1 cm lucent lesion in the C7 vertebrae new from 04/05/2019 worrisome for a metastasis in C7.    Recent fracture of the right ninth rib, likely pathologic fracture.    Mediastinal adenopathy minimally increased from 04/05/2019.        CT of the abdomen and pelvis with contrast        There is a 4 cm destructive lesion left acetabulum similar to 06/08/2020.     There is a 3.5  cm expansile destructive lesion in anterior wall  of the right acetabulum new from 2016 and not included on 06/08/2020 left hip CT.    There are is also an approximate 3 cm lesion in the right ilium at the superior margin of the right acetabulum.     There is a destructive lesion involving the right pedicle and right transverse process of L4 that measures 4 cm in greatest dimension.     All of these areas are subject to the risk of pathologic fracture.     There is also destruction of the superior margin of the left acetabulum.    No abnormality of the liver, gallbladder, pancreas, spleen, adrenal glands, kidneys, aorta. No enlarged periaortic, pelvic sidewall or inguinal lymph nodes. Uterus and ovaries would be better evaluated by ultrasound. No bowel obstruction or diverticulitis is detected.    IMPRESSION:    There are multiple destructive skeletal lesions as discussed above some of which are at risk for pathologic fracture. These are consistent with metastatic disease and is new from 2016. The left acetabular lesion is similar to a CT of the left hip from 06/08/2020.    No liver metastasis or abdominal adenopathy detected.             Assessment/Plan:     1. Non-small cell lung cancer, right (CMS [facility])  64 year old woman with unfortunate metastatic recurrence almost 4 years out from her initial diagnosis of stage III right upper lobe non-small cell lung cancer.  Most of her disease I restricted the bone albeit with significant bony complications especially in the area of her bilateral acetabulum.    With her 2 daughters connected by video, I reviewed the result of her CT scan chest abdomen and pelvis as well as the interim pathology report from her recent biopsy the highly suggest metastatic carcinoma from her lung primary.  With these findings, she now understand that she has stage IV disease that is not curable and that the goal of care is palliative to prolong life, and minimize suffering, and prevent additional complications.    We discussed pain  control and stabilization of her bilateral acetabulum and I recommended radiation therapy.  I also recommended evaluation by orthopedic surgery for any input that he may have but especially with her risk of fracture with ambulation.  In line with treatment planning, we will obtain brain MRI to rule out metastatic disease, place MediPort for treatment and request for Caris NGS from her recent tissue and if not enough tissue we can do this from her original sample.    We discussed nutrition and will have a dietitian get in touch with her for nutritional history and recommendations.  She will meet with RN navigator on her next visit the who is already aware of her relapse and will who will provide chemotherapy Education in the next few weeks.  - MRI BRAIN W/WO CONTRAST; Future  - Refer to External Provider  - IR PORT PLACEMENT; Future    2. Bone metastasis (CMS HCC)  -I recommended less use of Motrin to preserve her renal function and reliance on acetaminophen and opioids.  She will need good bowel regimen.  I had previously prescribed Percocet for this purpose which she is currently only taking at night.  - OUTSIDE CONSULT/REFERRAL PROVIDER(AMB)  - OUTSIDE CONSULT/REFERRAL PROVIDER(AMB)    3. Paravertebral mass  -per #1 above    4. At high risk for fracture  - Ortho consult and RT  - OUTSIDE CONSULT/REFERRAL  PROVIDER(AMB)      Return in about 3 weeks (around 07/27/2020).    A total of 40 minutes was spent face to face on this evaluation with greater than 50% of time spent in education, in counseling, and in coordination of care.    Mertie Moores, MD        CC:  PCP General:  Gina Ensign, FNP-C  [facility] [address]  [place] [id number]      Portions of this note may be dictated using voice recognition software or a dictation service. Variances in spelling and vocabulary are possible and unintentional. Not all errors are caught/corrected. Please notify the [facility] if any  discrepancies are noted or if the meaning of any statement is not clear.

## 2020-07-09 ENCOUNTER — Encounter (HOSPITAL_COMMUNITY): Payer: Self-pay | Admitting: Radiology

## 2020-07-11 ENCOUNTER — Other Ambulatory Visit: Payer: Self-pay

## 2020-07-11 ENCOUNTER — Telehealth (HOSPITAL_COMMUNITY): Payer: Self-pay

## 2020-07-11 ENCOUNTER — Ambulatory Visit: Payer: 59

## 2020-07-11 NOTE — Telephone Encounter (Signed)
Spoke with patient and her daughter on the phone for initial nutrition consultation. Patient will be starting chemo and radiation treatment soon for lung cancer. Daughter reports the patient's current weight is 96 lbs, but that she has always been small. She also reports her appetite has never been very good. Patient has never been very food motivated. Daughter reports her mom had trouble maintaining her weight during her last round of chemo/radiation. Current diet is as follows: Ensure for breakfast. Lunch is something light. Might have lunch meat with crackers and cheese (sometimes just lunch meat). She may have an afternoon snack of a candy bar/sweets. Dinner is a heavier meal. Typically a meat and starch. Pt reports she is not a big fan of fruits and vegetables. She only likes watermelon, cantaloupe, green beans, and corn. She states she will eat a salad. Encouraged her to try to include one vegetable daily. She has been having an apple sauce with her pain meds before bed. Discussed ways to increase calories without increasing volume of food. Ex: include foods such as cheese and peanut butter to meals and snacks. Encouraged her to add butter, cheese, and/or sour cream to her baked potato at dinner. Made recommendations for snacks throughout the day. Encouraged her to eat something every 2 hours throughout the day. Recommended they count her calories for a day or two to make sure she is eating enough. Provided general calorie goal of 1500-1800 kcal per day to promote weight gain. Also discussed other nutritional supplements to try throughout treatment if weight loss occurs (Boost VHC, Benecalorie, etc). Pt comes in to see Dr. Holley Dexter on 07/20/20. Will provide nutritional handouts with more information at this visit. No further questions or concerns expressed at this time.     Signed,    Burnetta Sabin, RDN, LD

## 2020-07-13 ENCOUNTER — Other Ambulatory Visit (HOSPITAL_COMMUNITY): Payer: Self-pay

## 2020-07-16 ENCOUNTER — Other Ambulatory Visit: Payer: Self-pay | Admitting: Hematology & Oncology

## 2020-07-16 NOTE — Progress Notes (Signed)
[facility] MULTIDISCIPLINARY TUMOR BOARD DISCUSSION:    DATE:   07/16/2020    PATIENT:   Gina Barrett                                   MRN#:                       [id number]  DOB:    09/11/1955  AGE:   65 y.o.        PRESENTER: Dr.Shantice Menger    BRIEF HISTORY:   65 year old woman with stage IIIB non-small cell lung cancer diagnosed at [facility] in April of 2018 who has been under surveillance.  Serial imaging studies done a progressive Radiology in [place] had been negative PET-CT on 09/23/2019 showed increased left lower lobe pleural density that was suggestive of infection versus inflammation.  Nonetheless because of her high risk disease, lesion was biopsied on 11/02/2019 and was negative for recurrence.  Patient developed worsening low back pain over the past 4 months that was managed by her primary care physician with conservative measures including physical therapy and NSAIDs.  MRI was eventually done that showed an L4 paraspinal mass.  Because of worsening pain, patient presented to the ER on 06/28/2019 with any CT scan confirmed metastatic bone lesions.  Full staging CT of chest abdomen and pelvis on 07/03/2019 confirmed the same.  She underwent biopsy of the L4 paraspinal mass which confirmed metastatic poorly differentiated adenocarcinoma of lung primary.      TYPE OF PRESENTATION: Prospective  PATHOLOGY REVIEWED: Yes   RADIOGRAPHS REVIEWED: Yes       DIAGNOSIS: Metastatic poorly differentiated adenocarcinoma consistent with lung primary   AJCC STAGE:  IV  PROGNOSTIC INDICATORS DISCUSSED:  PDL1, ROS1, EGFR, ALK1 were all negative  NATIONAL GUIDELINES DISCUSSED: Yes; NCCN      GUIDELINES BASED RECOMMENDATION    SURGERY:  Patient was evaluated by Orthopedic surgery who saw no role with her current bone lesions      HEMATOLOGY/ONCOLOGY:  Palliative chemotherapy following completion of radiation therapy.    RADIATION ONCOLOGY: Palliative RT to bilateral acetabulum/hip for pain control and fracture  prevention      CLINICAL TRIALS REFERRAL:No      GENETIC TESTING:No          Truitt Leep 07/16/2020 11:23

## 2020-07-18 ENCOUNTER — Encounter (HOSPITAL_COMMUNITY): Payer: Self-pay | Admitting: Hematology & Oncology

## 2020-07-18 ENCOUNTER — Ambulatory Visit
Admission: RE | Admit: 2020-07-18 | Discharge: 2020-07-18 | Disposition: A | Discharge status: Home / Self Care | Payer: 59 | Source: Ambulatory Visit | Attending: Hematology & Oncology | Admitting: Hematology & Oncology

## 2020-07-18 ENCOUNTER — Other Ambulatory Visit (HOSPITAL_COMMUNITY): Payer: Self-pay | Admitting: Diagnostic Radiology

## 2020-07-18 ENCOUNTER — Other Ambulatory Visit: Payer: Self-pay

## 2020-07-18 DIAGNOSIS — C7951 Secondary malignant neoplasm of bone: Secondary | ICD-10-CM | POA: Insufficient documentation

## 2020-07-18 DIAGNOSIS — C801 Malignant (primary) neoplasm, unspecified: Secondary | ICD-10-CM

## 2020-07-18 DIAGNOSIS — C3491 Malignant neoplasm of unspecified part of right bronchus or lung: Secondary | ICD-10-CM | POA: Insufficient documentation

## 2020-07-18 MED ORDER — LIDOCAINE HCL 10 MG/ML (1 %) INJECTION SOLUTION
Freq: Once | INTRAMUSCULAR | Status: AC | PRN
Start: 2020-07-18 — End: 2020-07-18
  Administered 2020-07-18: 7 mL via INTRADERMAL

## 2020-07-18 MED ORDER — HEPARIN (PORCINE) 1,000 UNIT/ML INJECTION SOLUTION
Freq: Once | INTRAMUSCULAR | Status: AC | PRN
Start: 2020-07-18 — End: 2020-07-18
  Administered 2020-07-18: 1000 [IU] via INTRAVENOUS

## 2020-07-18 MED ORDER — FENTANYL (PF) 50 MCG/ML INJECTION SOLUTION
INTRAMUSCULAR | Status: AC
Start: 2020-07-18 — End: 2020-07-18
  Filled 2020-07-18: qty 2

## 2020-07-18 MED ORDER — FENTANYL (PF) 50 MCG/ML INJECTION SOLUTION
Freq: Once | INTRAMUSCULAR | Status: AC | PRN
Start: 2020-07-18 — End: 2020-07-18
  Administered 2020-07-18: 50 ug via INTRAVENOUS

## 2020-07-18 MED ORDER — HEPARIN (PORCINE) 1,000 UNIT/ML INJECTION - EAST
INTRAMUSCULAR | Status: AC
Start: 2020-07-18 — End: 2020-07-18
  Filled 2020-07-18: qty 1

## 2020-07-18 MED ORDER — LIDOCAINE 1 %-EPINEPHRINE 1:100,000 INJECTION SOLUTION
Freq: Once | INTRAMUSCULAR | Status: AC | PRN
Start: 2020-07-18 — End: 2020-07-18
  Administered 2020-07-18: 10 mL via INTRADERMAL

## 2020-07-20 ENCOUNTER — Ambulatory Visit: Payer: 59 | Attending: Hematology & Oncology | Admitting: Hematology & Oncology

## 2020-07-20 ENCOUNTER — Encounter (HOSPITAL_COMMUNITY): Payer: Self-pay | Admitting: Hematology & Oncology

## 2020-07-20 ENCOUNTER — Other Ambulatory Visit: Payer: Self-pay

## 2020-07-20 ENCOUNTER — Encounter (HOSPITAL_COMMUNITY): Payer: 59 | Admitting: Hematology & Oncology

## 2020-07-20 VITALS — BP 125/72 | HR 120 | Temp 98.0°F | Resp 32 | Ht 60.98 in | Wt 92.4 lb

## 2020-07-20 DIAGNOSIS — R222 Localized swelling, mass and lump, trunk: Secondary | ICD-10-CM

## 2020-07-20 DIAGNOSIS — R269 Unspecified abnormalities of gait and mobility: Secondary | ICD-10-CM | POA: Insufficient documentation

## 2020-07-20 DIAGNOSIS — Z79899 Other long term (current) drug therapy: Secondary | ICD-10-CM | POA: Insufficient documentation

## 2020-07-20 DIAGNOSIS — K5900 Constipation, unspecified: Secondary | ICD-10-CM

## 2020-07-20 DIAGNOSIS — Z95828 Presence of other vascular implants and grafts: Secondary | ICD-10-CM

## 2020-07-20 DIAGNOSIS — G893 Neoplasm related pain (acute) (chronic): Secondary | ICD-10-CM | POA: Insufficient documentation

## 2020-07-20 DIAGNOSIS — C7951 Secondary malignant neoplasm of bone: Secondary | ICD-10-CM | POA: Insufficient documentation

## 2020-07-20 DIAGNOSIS — C3491 Malignant neoplasm of unspecified part of right bronchus or lung: Secondary | ICD-10-CM | POA: Insufficient documentation

## 2020-07-20 DIAGNOSIS — Z452 Encounter for adjustment and management of vascular access device: Secondary | ICD-10-CM | POA: Insufficient documentation

## 2020-07-20 DIAGNOSIS — Z510 Encounter for antineoplastic radiation therapy: Secondary | ICD-10-CM | POA: Insufficient documentation

## 2020-07-20 DIAGNOSIS — Z87891 Personal history of nicotine dependence: Secondary | ICD-10-CM | POA: Insufficient documentation

## 2020-07-20 MED ORDER — PROCHLORPERAZINE MALEATE 10 MG TABLET
10.0000 mg | ORAL_TABLET | Freq: Four times a day (QID) | ORAL | 1 refills | Status: DC | PRN
Start: 2020-07-20 — End: 2021-03-06

## 2020-07-20 NOTE — Progress Notes (Signed)
Return Patient Progress Note    Date: 07/20/2020    Name: Gina Barrett  MRN: [id number]  DOB: [birth year]   Referring Physician: Self, Referral  Primary Care Provider: Lanier Ensign, FNP-C    Reason for visit/consultation Lung Cancer (F/U: NSCLC)      Interval History:   Ms. Heinke returns to clinic for continued evaluation and management of recent metastatic recurrence of her disease.  She is status post MediPort placement yesterday and began radiation yesterday as well.  She remains confined to the wheelchair.  Her pain is reasonable managed although she is not taking her Percocet as prescribed; preferring to take less because she has at home by herself during the time.  She takes pain medicine mostly at night.  She has constipation on Senokot.  Additional review of system as below.  She is scheduled for brain MRI on 07/23/2020.  See does not ambulated but tries to use her walker pre orthopedic recommendation when somebody is at home.    ROS:      Review of Systems   Musculoskeletal: Positive for back pain (low back and bil legs 8/10 today) and gait problem (uses a wheelchair).   Neurological: Positive for gait problem (uses a wheelchair).   All other systems reviewed and are negative.     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  -PET-CT scan on 09/06/2016 showed 10 mm right upper lobe FDG avid lesion that is slightly larger than 2017 CT and concerning for malignancy.  A separate 3 cm long area of tracer uptake in the left lower lobe was noted.  -CT-guided biopsy a [facility] on 10/02/2016  was positive for adenocarcinoma that is well to moderately differentiated.  -Mediastinoscopy 11/13/2016 positive for left mid paratrachael    Stage: IV, initially diagnosed as IIIB     Molecular/special studies:  ROS-1, ALK-1, EGFR, PDL1 all negative    Treatment & Surveillance:   1. S/p chemoradiation with carboplatin + paclitaxel + RT 66.6 Gy completed 02/10/19/18    2. Serial imaging since completion of therapy has been negative for recurrence.  Most recent imaging in on 12/03/2018 was also negative but showed a new T6 compression abnormality.     3. Chest CT with contrast from 09/23/2019 which in summary showed increasing pleural based density in the LLL which may be infectious or inflammatory.  There is increased 1.7cm LN in left hilum ans stable compression abnormality of T6.    4. Biopsy of left LL pleural bases mass on 11/02/2019 showed benign fibrovascular tissue with focal chronic inflammation.  no lung parechyma or malgnancy identified.    5. Patient developed progressive musculoskeletal pain around October /November of 2021 evaluation with multiple imaging studies highly suggest metastatic disease.  Evaluation for plasma cell was noncontributory.  Patient was referred to orthopedic surgery (Dr. Hervey Ard, by family request and known to patient)  for surgical recommendation especially regarding weight-bearing.    6. Biopsy of paraspinal soft tissue mass on 07/04/2020 confirmed metastatic carcinoma consistent with lung primary. CARIS NGS negative    7.Radiation Oncology for radiation to the acetabulum as well as the paraspinal mass started 07/19/2020 x 10 , ending 07/31/2020    8. Mediport  placed  07/19/2020    9.Begin Zoledronic acid.  Has dentures and no dental eval needed    10. Brain MRI planne2/12/2020     11.  Begin palliative carboplatin & pemetrexed on 08/03/2020         11.      ________________________________________________________________________________________________________________________________________________     Objective   Objective:   BP 125/72 (Site: Left, Patient Position: Sitting)    Pulse (!) 120    Temp 36.7 C (98 F) (Thermal Scan)    Resp (!) 32    Ht 1.549 m (5' 0.98") Comment: *   Wt 41.9 kg (92 lb 6.4 oz)    SpO2 98% Comment: ra   BMI 17.47 kg/m       ECOG Status: 3 - Capable of only limited selfcare, confined to bed or chair more than 50% of waking hour.  Physical Exam  Constitutional:       General: She is not in acute distress.     Appearance: She is ill-appearing.   Cardiovascular:      Rate and Rhythm: Regular rhythm.      Heart sounds: Normal heart sounds.   Pulmonary:      Breath sounds: Normal breath sounds.   Chest:      Comments: Right chest wall MediPort site looks clean without lot of ecchymosis.  No neck swelling.  Musculoskeletal:      Right lower leg: No edema.      Left lower leg: No edema.   Neurological:      Mental Status: She is alert.          Past Medical History:   Diagnosis Date    Abnormal Pap smear     ASCUS, ASCUS cannot rule out HGSIL    Cancer (CMS HCC)     non small cell lung cancer (R)-s/p Chemo/Radiation completed 8/18    Colon polyp     Dysphagia     Hypothyroid     Non-small cell lung cancer, right (CMS HCC)     s/p Chemo completed 9/18    Raynaud's disease     STD (sexually transmitted disease)     HSV    Unspecified disorder of thyroid     Hypothyroidism         Current Outpatient Medications   Medication Sig    Artifi. Tear, Hypromellose,-PF (RETAINE [facility], PF,) 0.3 % Ophthalmic Drops Administer into affected eye(s) Once per day as needed    carboxymethyl/glycerin/poly80 (REFRESH OPTIVE ADVANCED OPHT) Apply to eye Once per day as needed    carboxymethylcellulose Sodium (THERATEARS) 0.25 % Ophthalmic Drops 1-2 Drops Once per day as needed    hydrOXYchloroQUINE (PLAQUENIL) 200 mg Oral Tablet TAKE 2 TABLETS BY  MOUTH EVERY DAY    levothyroxine (SYNTHROID) 88 mcg Oral Tablet TAKE ONE TABLET BY MOUTH EVERY DAY    loratadine (CLARITIN) 10 mg Oral Tablet take 10 mg by mouth Once a day.    MULTIVITAMIN ORAL Take by mouth Once a day Centrum Silver Women's 50+    nebivoloL (BYSTOLIC) 5 mg Oral Tablet TAKE ONE TABLET BY MOUTH EVERY DAY    Nifedipine (ADALAT CC) 30 mg Oral Tablet Sustained Release take 30 mg by mouth Once a day.    OMEGA-3 FATTY ACIDS (FISH OIL ORAL) take  by mouth Once a day.    oxyCODONE-acetaminophen (PERCOCET) 10-325 mg Oral Tablet Take 1 Tablet by mouth Every 6 hours as needed for Pain for up to 30 days  prochlorperazine (COMPAZINE) 10 mg Oral Tablet Take 1 Tablet (10 mg total) by mouth Four times a day as needed for Nausea/Vomiting    sennosides-docusate sodium (SENOKOT-S) 8.6-50 mg Oral Tablet Take 1 Tablet by mouth Every evening    tiotropium-olodateroL (STIOLTO RESPIMAT) 2.5-2.5 mcg/actuation Inhalation Mist Take 2 INHALATION by inhalation Once a day    UNKNOWN MEDICATION (UNKNOWN MEDICATION) Administer into affected eye(s) [name] 128 Eye Drops OTC 4x daily  [name] 128 Eye Gel OTC Nightly     Allergies as of 07/20/2020 - Reviewed 07/20/2020   Allergen Reaction Noted    Penicillins  08/15/2010       Social History  Occupation:   Social History     Occupational History     Employer: [employer] [id number]   reports that she quit smoking about 21 years ago. Her smoking use included cigarettes. She started smoking about 49 years ago. She quit after 20.00 years of use. She has never used smokeless tobacco. She reports being sexually active and has had partner(s) who are female. She reports using the following method of birth control/protection: None. She reports that she does not drink alcohol and does not use drugs.    Labs:  Labs reviewed and interpreted:  No results found for this or any previous visit (from the past 96 hour(s)).    Radiology: All recent pertinent  radiologic images and reports reviewed          Assessment/Plan:     1. Non-small cell lung cancer, right (CMS [facility])  65 year old woman with unfortunate metastatic recurrence of her non-small cell lung cancer diagnosed almost 4 years ago and manifesting with destructive bone lesions.  She is now status post MediPort placement and will give palliative radiation therapy before beginning palliative chemotherapy.  Her Caris NGS did not demonstrate any targetable receptors including PDL1.  I anticipate beginning her chemotherapy on 08/03/2020 following completion of radiation therapy.  Her current ECOG performance status is a 3 and mostly related to limitations of her metastatic bone lesions.    -I mentioned her risk of venous thromboembolism given her current sedentary situation and have asked her to pay attention to any leg swelling or acute onset dyspnea.    -I was unable to prescribe requested Zofran because of drug drug interaction with Plaquenil.  Zofran is going to be a very important component of antiemetic regimen and I have asked her to see her rheumatologist to discuss alternative agent to Plaquenil.  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    - prochlorperazine (COMPAZINE) 10 mg Oral Tablet; Take 1 Tablet (10 mg total) by mouth Four times a day as needed for Nausea/Vomiting  Dispense: 90 Tablet; Refill: 1    2. Bone metastasis (CMS HCC)  -Begin zoledronic acid monthly.  Treatment may be started why she is getting radiation therapy.  -does not need dental evaluation she has dentures.  -check vitamin D level for starting.    3. Paravertebral mass  -continue radiation as planned    4. Cancer associated pain  - continue Percocet for now and anticipate that ongoing radiation will help with pain as well.  -remain on wheelchair with use of walker only for short distances and when somebody is at home.    5. Functional gait disorder  -patient needs wheelchair because she is non weight bearing    -walker or cane would not be sufficient   -needs wheelchair to complete ADLs in the  home   -patient weighs less than 100 lbs and needs a small wheelchair       6. Constipation, unspecified constipation type  Add MiraLax to Senokot.  May try prune juice as well.    7. Port-A-Cath in place  Site is clean and appropriately tender.  No neck swelling.        Return in about 3 weeks (around 08/10/2020).    Mertie Moores, MD        CC:  PCP General:  Lanier Ensign, FNP-C  [place] [address]  [place] [address]      Portions of this note may be dictated using voice recognition software or a dictation service. Variances in spelling and vocabulary are possible and unintentional. Not all errors are caught/corrected. Please notify the [facility] if any discrepancies are noted or if the meaning of any statement is not clear.

## 2020-07-23 ENCOUNTER — Other Ambulatory Visit: Payer: Self-pay

## 2020-07-23 ENCOUNTER — Ambulatory Visit
Admission: RE | Admit: 2020-07-23 | Discharge: 2020-07-23 | Disposition: A | Discharge status: Home / Self Care | Payer: 59 | Source: Ambulatory Visit | Attending: Hematology & Oncology | Admitting: Hematology & Oncology

## 2020-07-23 ENCOUNTER — Telehealth (HOSPITAL_COMMUNITY): Payer: Self-pay

## 2020-07-23 DIAGNOSIS — I6782 Cerebral ischemia: Secondary | ICD-10-CM

## 2020-07-23 DIAGNOSIS — C3491 Malignant neoplasm of unspecified part of right bronchus or lung: Secondary | ICD-10-CM | POA: Insufficient documentation

## 2020-07-23 MED ORDER — GADOTERIDOL 279.3 MG/ML INTRAVENOUS SOLUTION
15.0000 mL | INTRAVENOUS | Status: AC
Start: 2020-07-23 — End: 2020-07-23
  Administered 2020-07-23: 15 mL via INTRAVENOUS
  Filled 2020-07-23: qty 15

## 2020-07-23 NOTE — Telephone Encounter (Addendum)
Pt was in on Friday for OV. Daughter was a bit unclear regarding meds they were to stop/change and is asking Dr. Holley Dexter for clarification.    Dr. Holley Dexter notified.    Servando Snare  Triage Nurse

## 2020-07-24 ENCOUNTER — Encounter (HOSPITAL_COMMUNITY): Payer: Self-pay | Admitting: Hematology & Oncology

## 2020-07-25 ENCOUNTER — Other Ambulatory Visit: Payer: Self-pay

## 2020-07-25 ENCOUNTER — Telehealth (HOSPITAL_COMMUNITY): Payer: Self-pay

## 2020-07-25 ENCOUNTER — Inpatient Hospital Stay
Admission: RE | Admit: 2020-07-25 | Discharge: 2020-07-25 | Disposition: A | Discharge status: Home / Self Care | Payer: 59 | Source: Ambulatory Visit | Attending: Diagnostic Radiology | Admitting: Diagnostic Radiology

## 2020-07-25 DIAGNOSIS — C801 Malignant (primary) neoplasm, unspecified: Secondary | ICD-10-CM | POA: Insufficient documentation

## 2020-07-25 DIAGNOSIS — C7951 Secondary malignant neoplasm of bone: Secondary | ICD-10-CM | POA: Insufficient documentation

## 2020-07-25 NOTE — Telephone Encounter (Signed)
Pt's daughter-in-law came upstairs whil pt was getting radiation tx to have some questions addressed. Dr. Holley Dexter had asked them to call rheumatologist to change med as it will interact with nausea meds. What was the med? Hydrochloroquine is med rheumatologist will need to address.    Pt also had questions re bowel regimen. She has been constipated x 1 day. Pt is currently taking sennokot. Advised she can add Miralax starting with 1 x daily. She can take up to 3 times daily if needed with 8 oz water and drink 8 cups water daily.    Advised d-i-l to have pt eat small amounts every 3 to 4 hours rather than trying to eat 3 large meals.     Daughter-in-law also spoke with social worker, Judson Roch re needing wheelchair.    All questions addressed and answered to her satisfaction.    Servando Snare  Triage Nurse

## 2020-07-27 ENCOUNTER — Encounter (HOSPITAL_COMMUNITY): Payer: Self-pay

## 2020-07-27 DIAGNOSIS — R269 Unspecified abnormalities of gait and mobility: Secondary | ICD-10-CM

## 2020-07-27 DIAGNOSIS — C7951 Secondary malignant neoplasm of bone: Secondary | ICD-10-CM

## 2020-07-27 DIAGNOSIS — C3491 Malignant neoplasm of unspecified part of right bronchus or lung: Secondary | ICD-10-CM

## 2020-07-27 NOTE — Progress Notes (Signed)
SW Note:  Faxed DME order for wheelchair to [employer].  Cheree Ditto, SOCIAL WORKER

## 2020-08-02 ENCOUNTER — Encounter (HOSPITAL_COMMUNITY): Payer: Self-pay | Admitting: Radiology

## 2020-08-04 ENCOUNTER — Encounter (HOSPITAL_COMMUNITY): Payer: Self-pay | Admitting: Hematology & Oncology

## 2020-08-06 ENCOUNTER — Telehealth (HOSPITAL_COMMUNITY): Payer: Self-pay

## 2020-08-06 ENCOUNTER — Inpatient Hospital Stay (HOSPITAL_COMMUNITY)
Admission: RE | Admit: 2020-08-06 | Discharge: 2020-08-06 | Disposition: A | Discharge status: Still In Hospital | Payer: 59 | Source: Ambulatory Visit | Attending: Hematology & Oncology | Admitting: Hematology & Oncology

## 2020-08-06 DIAGNOSIS — C3491 Malignant neoplasm of unspecified part of right bronchus or lung: Secondary | ICD-10-CM

## 2020-08-06 DIAGNOSIS — I7300 Raynaud's syndrome without gangrene: Secondary | ICD-10-CM | POA: Insufficient documentation

## 2020-08-06 DIAGNOSIS — C7951 Secondary malignant neoplasm of bone: Secondary | ICD-10-CM | POA: Insufficient documentation

## 2020-08-06 DIAGNOSIS — D6481 Anemia due to antineoplastic chemotherapy: Secondary | ICD-10-CM | POA: Insufficient documentation

## 2020-08-06 DIAGNOSIS — R269 Unspecified abnormalities of gait and mobility: Secondary | ICD-10-CM

## 2020-08-06 DIAGNOSIS — G893 Neoplasm related pain (acute) (chronic): Secondary | ICD-10-CM | POA: Insufficient documentation

## 2020-08-06 DIAGNOSIS — C801 Malignant (primary) neoplasm, unspecified: Secondary | ICD-10-CM | POA: Insufficient documentation

## 2020-08-06 DIAGNOSIS — R5381 Other malaise: Secondary | ICD-10-CM | POA: Insufficient documentation

## 2020-08-06 DIAGNOSIS — Z87891 Personal history of nicotine dependence: Secondary | ICD-10-CM | POA: Insufficient documentation

## 2020-08-06 DIAGNOSIS — Z79899 Other long term (current) drug therapy: Secondary | ICD-10-CM | POA: Insufficient documentation

## 2020-08-06 DIAGNOSIS — T451X5A Adverse effect of antineoplastic and immunosuppressive drugs, initial encounter: Secondary | ICD-10-CM | POA: Insufficient documentation

## 2020-08-06 NOTE — Telephone Encounter (Signed)
Patients family requesting order for wheelchair cushion. Order placed

## 2020-08-06 NOTE — Telephone Encounter (Signed)
Pt called in. She has been constipated the last few days. Some abd pain, passing a lot of gas, nausea. She has been taking Senna ordered by Dr. Holley Dexter. Today bowels are starting to move.    Advised pt to add Miralax to bowel regimen and to call again if she does not have a formed BM in the next 24 to 48 hours or if her symptoms get worse. Pt verbalized understanding.    Servando Snare  Triage Nurse

## 2020-08-08 ENCOUNTER — Other Ambulatory Visit: Payer: Self-pay | Admitting: General Practice

## 2020-08-08 DIAGNOSIS — E049 Nontoxic goiter, unspecified: Secondary | ICD-10-CM

## 2020-08-09 ENCOUNTER — Other Ambulatory Visit: Payer: Self-pay | Admitting: General Practice

## 2020-08-09 ENCOUNTER — Inpatient Hospital Stay (HOSPITAL_COMMUNITY)
Admission: RE | Admit: 2020-08-09 | Discharge: 2020-08-09 | Disposition: A | Discharge status: Still In Hospital | Payer: 59 | Source: Ambulatory Visit | Attending: Hematology & Oncology | Admitting: Hematology & Oncology

## 2020-08-09 ENCOUNTER — Encounter (HOSPITAL_COMMUNITY): Payer: Self-pay

## 2020-08-09 ENCOUNTER — Other Ambulatory Visit (HOSPITAL_COMMUNITY): Payer: Self-pay | Admitting: Hematology & Oncology

## 2020-08-09 ENCOUNTER — Other Ambulatory Visit: Payer: Self-pay

## 2020-08-09 VITALS — BP 123/67 | HR 100 | Temp 98.2°F | Resp 28 | Ht 60.98 in | Wt 91.4 lb

## 2020-08-09 DIAGNOSIS — D61811 Other drug-induced pancytopenia: Secondary | ICD-10-CM

## 2020-08-09 DIAGNOSIS — C801 Malignant (primary) neoplasm, unspecified: Secondary | ICD-10-CM

## 2020-08-09 DIAGNOSIS — C3491 Malignant neoplasm of unspecified part of right bronchus or lung: Secondary | ICD-10-CM

## 2020-08-09 DIAGNOSIS — C3490 Malignant neoplasm of unspecified part of unspecified bronchus or lung: Secondary | ICD-10-CM

## 2020-08-09 DIAGNOSIS — Z95828 Presence of other vascular implants and grafts: Secondary | ICD-10-CM

## 2020-08-09 DIAGNOSIS — D708 Other neutropenia: Secondary | ICD-10-CM

## 2020-08-09 DIAGNOSIS — C7951 Secondary malignant neoplasm of bone: Secondary | ICD-10-CM

## 2020-08-09 DIAGNOSIS — Z1231 Encounter for screening mammogram for malignant neoplasm of breast: Secondary | ICD-10-CM

## 2020-08-09 LAB — CBC WITH DIFF
BASOPHIL #: <0.1 10*3/uL (ref <=0.20)
BASOPHIL %: 0 %
EOSINOPHIL #: <0.1 10*3/uL (ref <=0.50)
EOSINOPHIL %: 0 %
HCT: 30 % — ABNORMAL LOW (ref 34.8–46.0)
HGB: 10 g/dL — ABNORMAL LOW (ref 11.5–16.0)
IMMATURE GRANULOCYTE #: 0.11 10*3/uL — ABNORMAL HIGH (ref <0.10)
IMMATURE GRANULOCYTE %: 2 % — ABNORMAL HIGH (ref 0–1)
LYMPHOCYTE #: 0.4 10*3/uL — ABNORMAL LOW (ref 1.00–4.80)
LYMPHOCYTE %: 6 %
MCH: 28.8 pg (ref 26.0–32.0)
MCHC: 33.3 g/dL (ref 31.0–35.5)
MCV: 86.5 fL (ref 78.0–100.0)
MONOCYTE #: 0.95 10*3/uL (ref 0.20–1.10)
MONOCYTE %: 13 %
MPV: 9 fL (ref 8.7–12.5)
NEUTROPHIL #: 5.76 10*3/uL (ref 1.50–7.70)
NEUTROPHIL %: 79 %
PLATELETS: 289 10*3/uL (ref 150–400)
RBC: 3.47 10*6/uL — ABNORMAL LOW (ref 3.85–5.22)
RDW-CV: 13.9 % (ref 11.5–15.5)
WBC: 7.3 10*3/uL (ref 3.7–11.0)

## 2020-08-09 LAB — COMPREHENSIVE METABOLIC PANEL, NON-FASTING
ALBUMIN: 2.7 g/dL — ABNORMAL LOW (ref 3.4–4.8)
ALKALINE PHOSPHATASE: 103 U/L (ref 50–130)
ALT (SGPT): 13 U/L (ref 8–22)
ANION GAP: 6 mmol/L (ref 4–13)
AST (SGOT): 25 U/L (ref 8–45)
BILIRUBIN TOTAL: 0.3 mg/dL (ref 0.3–1.3)
BUN/CREA RATIO: 15 (ref 6–22)
BUN: 9 mg/dL (ref 8–25)
CALCIUM: 9 mg/dL (ref 8.8–10.2)
CHLORIDE: 104 mmol/L (ref 96–111)
CO2 TOTAL: 25 mmol/L (ref 23–31)
CREATININE: 0.62 mg/dL (ref 0.60–1.05)
ESTIMATED GFR: >90 mL/min/BSA (ref >=60)
GLUCOSE: 110 mg/dL (ref 65–125)
POTASSIUM: 4.6 mmol/L (ref 3.5–5.1)
PROTEIN TOTAL: 6.6 g/dL (ref 6.0–8.0)
SODIUM: 135 mmol/L — ABNORMAL LOW (ref 136–145)

## 2020-08-09 MED ORDER — SODIUM CHLORIDE 0.9 % INTRAVENOUS SOLUTION
12.0000 mg | Freq: Once | INTRAVENOUS | Status: AC
Start: 2020-08-09 — End: 2020-08-09
  Administered 2020-08-09: 14:00:00 12 mg via INTRAVENOUS
  Administered 2020-08-09: 15:00:00 0 mg via INTRAVENOUS
  Filled 2020-08-09: qty 3

## 2020-08-09 MED ORDER — DIPHENHYDRAMINE 50 MG/ML INJECTION SOLUTION
50.0000 mg | Freq: Once | INTRAMUSCULAR | Status: DC | PRN
Start: 2020-08-09 — End: 2020-08-10

## 2020-08-09 MED ORDER — CYANOCOBALAMIN (VIT B-12) 1,000 MCG/ML INJECTION SOLUTION
1000.0000 ug | Freq: Once | INTRAMUSCULAR | Status: AC
Start: 2020-08-09 — End: 2020-08-09
  Administered 2020-08-09: 1000 ug via SUBCUTANEOUS
  Filled 2020-08-09: qty 1

## 2020-08-09 MED ORDER — DEXAMETHASONE 4 MG TABLET
4.0000 mg | ORAL_TABLET | Freq: Two times a day (BID) | ORAL | 1 refills | Status: DC
Start: 2020-08-09 — End: 2020-09-02

## 2020-08-09 MED ORDER — PALONOSETRON 0.25 MG/5 ML INTRAVENOUS SOLUTION
0.2500 mg | Freq: Once | INTRAVENOUS | Status: AC
Start: 2020-08-09 — End: 2020-08-09
  Administered 2020-08-09: 14:00:00 0.25 mg via INTRAVENOUS
  Filled 2020-08-09: qty 5

## 2020-08-09 MED ORDER — SODIUM CHLORIDE 0.9 % INTRAVENOUS SOLUTION
150.0000 mg | Freq: Once | INTRAVENOUS | Status: AC
Start: 2020-08-09 — End: 2020-08-09
  Administered 2020-08-09: 15:00:00 150 mg via INTRAVENOUS
  Administered 2020-08-09: 15:00:00 0 mg via INTRAVENOUS
  Filled 2020-08-09: qty 5

## 2020-08-09 MED ORDER — SODIUM CHLORIDE 0.9 % INTRAVENOUS SOLUTION
387.5000 mg | Freq: Once | INTRAVENOUS | Status: AC
Start: 2020-08-09 — End: 2020-08-09
  Administered 2020-08-09: 16:00:00 0 mg via INTRAVENOUS
  Administered 2020-08-09: 16:00:00 390 mg via INTRAVENOUS
  Filled 2020-08-09: qty 39

## 2020-08-09 MED ORDER — SODIUM CHLORIDE 0.9% FLUSH BAG - 250 ML
INTRAVENOUS | Status: DC | PRN
Start: 2020-08-09 — End: 2020-08-10

## 2020-08-09 MED ORDER — SODIUM CHLORIDE 0.9 % (FLUSH) INJECTION SYRINGE
10.0000 mL | INJECTION | Freq: Once | INTRAMUSCULAR | Status: AC
Start: 2020-08-09 — End: 2020-08-09
  Administered 2020-08-09: 10 mL

## 2020-08-09 MED ORDER — HYDROCORTISONE SOD SUCCINATE (PF) 100 MG/2 ML SOLUTION FOR INJECTION
100.0000 mg | Freq: Once | INTRAMUSCULAR | Status: DC | PRN
Start: 2020-08-09 — End: 2020-08-10

## 2020-08-09 MED ORDER — HEPARIN, PORCINE (PF) 100 UNIT/ML INTRAVENOUS SYRINGE
5.0000 mL | INJECTION | Freq: Once | INTRAVENOUS | Status: AC
Start: 2020-08-09 — End: 2020-08-09
  Administered 2020-08-09: 16:00:00 5 mL
  Filled 2020-08-09: qty 5

## 2020-08-09 MED ORDER — FAMOTIDINE (PF) 20 MG/2 ML INTRAVENOUS SOLUTION
20.0000 mg | Freq: Once | INTRAVENOUS | Status: DC | PRN
Start: 2020-08-09 — End: 2020-08-10

## 2020-08-09 MED ORDER — FOLIC ACID 1 MG TABLET
1.0000 mg | ORAL_TABLET | Freq: Every day | ORAL | 1 refills | Status: DC
Start: 2020-08-09 — End: 2020-10-02

## 2020-08-09 MED ORDER — SODIUM CHLORIDE 0.9 % INTRAVENOUS SOLUTION
500.0000 mg/m2 | Freq: Once | INTRAVENOUS | Status: AC
Start: 2020-08-09 — End: 2020-08-09
  Administered 2020-08-09: 16:00:00 0 mg via INTRAVENOUS
  Administered 2020-08-09: 675 mg via INTRAVENOUS
  Filled 2020-08-09: qty 20

## 2020-08-09 MED ORDER — ONDANSETRON 8 MG DISINTEGRATING TABLET
8.0000 mg | ORAL_TABLET | Freq: Three times a day (TID) | ORAL | 2 refills | Status: DC | PRN
Start: 2020-08-09 — End: 2021-03-06

## 2020-08-09 NOTE — Nurses Notes (Signed)
Patient educated on behalf of primary nurse navigator Delma Freeze, Therapist, sports. Reviewed new medication, alimta and side effects related. Patient has received carboplatin in the past. Reviewed vitamin B12 injection along with taking daily folic acid, which has been sent to pharmacy along with antiemetics. Briefly reviewed common side effects such as but not limited to nausea, vomiting, fatigue, low blood counts etc. Reviewed with patient that she could take tylenol but to avoid NSAIDs. Since patient has received carboplatin in the past we discussed to immediately make nurse aware if she feels any different during carboplatin infusion such as shortness of breath, chest tightness, itching or rash. Patient provided with new patient binder. Patient verbalizes understanding of all and has no further questions at this time.

## 2020-08-09 NOTE — Nurses Notes (Signed)
Patient discharged home via wheelchair to waiting room.  AVS reviewed with patient.  A written copy of the AVS and discharge instructions was given to the patient.  Questions sufficiently answered as needed.  Patient encouraged to follow up with PCP as indicated.  In the event of an emergency, patient instructed to call 911 or go to the nearest emergency room.

## 2020-08-09 NOTE — Discharge Instructions (Signed)
Patient Education     Nutrition During Chemotherapy     Drink plenty of liquids, such as water.     During chemotherapy, the energy you get froma healthy diet can help you rebuild normal cells. It can also help you keep up your strength and fight infection. As a result, you may feel better and be more able to cope with side effects. Ask your healthcare provider about your nutrition needs.  Drink plenty of fluids   Fluids help the body produce urine and decrease constipation. They help prevent kidney and bladder problems. They also help replace fluids lost from vomiting and diarrhea.   Try water, unsweetened juices, and other flavored drinks without caffeine. They flush toxins from the body.  Get enough calories   Calories are fuel. The body uses this fuel to perform all of its functions, including healing.   It'sOK to be lean, but be sure you are not underweight. If you are, try eating more calories.   Eat calorie-dense foods such as avocados, peanut butter, eggs, and ice cream.   If you need extra calories, add butter, gravy, and sauces to foods (if tolerated).   If you don't need the extra calories, try to limit foods that are fried, greasy, or high in fat or added sugar.  Eat protein, fruits, and vegetables   Protein builds muscle, bone, skin, and blood. It helps your body heal and fight infection. It also helps boost your energy level.   Good protein choices include yogurt, eggs, chicken, lean meats, beans, and peanut butter.   Fruits and vegetables are full of important vitamins, minerals, and fiberto help your body function properly.   Try to eat a variety of vegetables, fruits, whole grains, and beans.   Ask your healthcare provider about instant protein powder or other supplements.  Eating right during treatment  Side effects may make it a little harder to eat well on some days. To help you continue to get the nutrition you need:   Be open to new foods and recipes.   Eat small portions often  and slowly.   Have a healthy snack instead of a meal if you are not very hungry.   Try eating in a new setting.   Do physical activity, such as walking, to help increase your appetite. Try to be active for at least30 minutes each day.   Boostyour diet by getting the vitamins and minerals you need from fruits, vegetables, and whole grains.   If you live alone and are not up to cooking, ask your healthcare provider about Meals on Wheels or other outreach programs.   Sometimes, it is best to follow your appetite. Eat when you are hungry, but when you are not, forcing yourself to eat can make you feel bad, nauseated, or even cause you to vomit.  [employer] last reviewed this educational content on 11/14/2016   2000-2021 [employer]. All rights reserved. This information is not intended as a substitute for professional medical care. Always follow your healthcare professional's instructions.

## 2020-08-09 NOTE — Nursing Note (Signed)
Patient rescheduled cycle 1 due to constipation.  This nurse will not be in clinic today during infusion. Lonn Georgia, RN will provide education chairside.

## 2020-08-09 NOTE — Nurses Notes (Signed)
Confirmed Carboplatin dose with Dr. Holley Dexter.  Desired dose is 390mg .

## 2020-08-16 ENCOUNTER — Telehealth (HOSPITAL_COMMUNITY): Payer: Self-pay

## 2020-08-16 NOTE — Telephone Encounter (Signed)
Pt left a VM that she has had a rash on her bottom for a while. She has been treating it with desitin and zinc cream. The rash is now gone, but she has several open sores on her bottom.    Returned the call and spoke with a family member as she was asleep. Her rash started before she began chemo. What is left is dry and flaky along with a few open sores. Since the rash started before chemo, advised that she go to see her PCP to make sure she is not getting pressure ulcers from sitting in her wheel chair so much. Advised aquaphor or Eucerin for skin hydration.    Dr. Holley Dexter notified.    Servando Snare  Triage Nurse

## 2020-08-20 ENCOUNTER — Ambulatory Visit
Admission: RE | Admit: 2020-08-20 | Discharge: 2020-08-20 | Disposition: A | Discharge status: Home / Self Care | Payer: 59 | Source: Ambulatory Visit | Attending: General Practice | Admitting: General Practice

## 2020-08-20 ENCOUNTER — Other Ambulatory Visit (HOSPITAL_COMMUNITY): Payer: Self-pay | Admitting: Internal Medicine

## 2020-08-20 DIAGNOSIS — G893 Neoplasm related pain (acute) (chronic): Secondary | ICD-10-CM

## 2020-08-20 DIAGNOSIS — E049 Nontoxic goiter, unspecified: Secondary | ICD-10-CM

## 2020-08-20 MED ORDER — OXYCODONE-ACETAMINOPHEN 10 MG-325 MG TABLET
1.0000 | ORAL_TABLET | Freq: Four times a day (QID) | ORAL | 0 refills | Status: DC | PRN
Start: 2020-08-20 — End: 2020-10-01

## 2020-08-21 ENCOUNTER — Telehealth (INDEPENDENT_AMBULATORY_CARE_PROVIDER_SITE_OTHER): Payer: Self-pay | Admitting: Family

## 2020-08-21 NOTE — Telephone Encounter (Signed)
Patient called in requesting two medications.   Medihoney gel and Nystatin cream to [facility].    Patient would also like to give you an update on chemo and radiation treatments. [phone] Thank you.

## 2020-08-23 ENCOUNTER — Other Ambulatory Visit (INDEPENDENT_AMBULATORY_CARE_PROVIDER_SITE_OTHER): Payer: Self-pay | Admitting: Family

## 2020-08-23 MED ORDER — MEDIHONEY WOUND/BURN DRESSING EX GEL
1.0000 | Freq: Two times a day (BID) | CUTANEOUS | 2 refills | Status: DC
Start: ? — End: 2020-08-23

## 2020-08-23 MED ORDER — NYSTATIN 100000 UNIT/GM EX CREA
TOPICAL_CREAM | Freq: Two times a day (BID) | CUTANEOUS | 1 refills | Status: DC
Start: ? — End: 2020-08-23

## 2020-08-23 NOTE — Telephone Encounter (Signed)
FYI   Scripts sent.    I called patient and left a message.    Marya Landry, FNP

## 2020-08-27 ENCOUNTER — Ambulatory Visit (INDEPENDENT_AMBULATORY_CARE_PROVIDER_SITE_OTHER): Payer: Commercial Managed Care - POS | Admitting: Family

## 2020-08-27 ENCOUNTER — Encounter (INDEPENDENT_AMBULATORY_CARE_PROVIDER_SITE_OTHER): Payer: Self-pay | Admitting: Family

## 2020-08-27 ENCOUNTER — Inpatient Hospital Stay (HOSPITAL_COMMUNITY): Payer: 59

## 2020-08-27 VITALS — BP 110/62 | HR 68 | Temp 99.6°F | Resp 18 | Ht 61.0 in | Wt 88.0 lb

## 2020-08-27 DIAGNOSIS — R630 Anorexia: Secondary | ICD-10-CM

## 2020-08-27 DIAGNOSIS — H6191 Disorder of right external ear, unspecified: Secondary | ICD-10-CM

## 2020-08-27 DIAGNOSIS — K5903 Drug induced constipation: Secondary | ICD-10-CM

## 2020-08-27 DIAGNOSIS — C7951 Secondary malignant neoplasm of bone: Secondary | ICD-10-CM

## 2020-08-27 DIAGNOSIS — L209 Atopic dermatitis, unspecified: Secondary | ICD-10-CM

## 2020-08-27 DIAGNOSIS — L89311 Pressure ulcer of right buttock, stage 1: Secondary | ICD-10-CM

## 2020-08-27 DIAGNOSIS — C3491 Malignant neoplasm of unspecified part of right bronchus or lung: Secondary | ICD-10-CM

## 2020-08-27 MED ORDER — MEGESTROL ACETATE 40 MG PO TABS
40.0000 mg | ORAL_TABLET | Freq: Every day | ORAL | 0 refills | Status: DC
Start: ? — End: 2020-08-27

## 2020-08-27 MED ORDER — DESOXIMETASONE 0.25 % EX OINT
TOPICAL_OINTMENT | Freq: Two times a day (BID) | CUTANEOUS | 1 refills | Status: DC
Start: ? — End: 2020-08-27

## 2020-08-27 MED ORDER — CLOBETASOL PROP EMOLLIENT BASE 0.05 % EX CREA
1.0000 | TOPICAL_CREAM | Freq: Two times a day (BID) | CUTANEOUS | 1 refills | Status: DC
Start: ? — End: 2020-08-27

## 2020-08-27 NOTE — Progress Notes (Signed)
Subjective:    Patient ID: Melody Hendricks is a 65 y.o. female.    Here for evaluation for constipation. States using stool soft, but is having trouble pushing the stool out.  Her daughter in law states she believes it is related to her weak muscles and not a constipation issue. Patient states she is not having any bloating. She is currently using Senna daily      She is currently under the care of hem/onc for lung cancer that has unfortunately metastasized to her bone. States chemo was about 3 weeks ago. She finished 10 radiation treatment.    She has a pressure sore on her left buttock. Has been using Medi honey with good results. Area almost closed. Does have dry patches on both her buttocks. Use nystatin cream with modest results.    Has diminished appetite. She is reported eating about 500 calories a day. Patient complains that she does not feel like eating anything. She does drink one ensure a day.    The sore on her right ear has returned since starting chemo. She has had a lesion on her ear when she had chemo for her lung cancer. States she saw derm and was given a cream, but she is unsure the name of it.    Has good support. Both herself and her husband has moved in with her son and daughter in law. Her daughter is also very involved in her care. They do not have any home services @ this time. Patient states she does not feel like she needs any in home care @ this time.   She is wheelchair bound due to fear of pathological fracture related to her bone cancer.  She has an appointment with Dr. Lorrin Jackson, oncology tomorrow.  She also has an appointment with orthopedics this week     Constipation        The following portions of the patient's history were reviewed and updated as appropriate: allergies, current medications, past family history, past medical history, past social history, past surgical history and problem list.    Review of Systems   Constitutional: Positive for appetite change (diminshed ).    Gastrointestinal: Positive for constipation.   Skin:        Sore on left buttock, left ear           Objective:    Physical Exam  Vitals reviewed.   Constitutional:       Comments: frail   HENT:      Head: Normocephalic.      Right Ear: Tympanic membrane, ear canal and external ear normal.      Left Ear: Tympanic membrane and ear canal normal.      Ears:        Comments: Scabbed lesion, left ear     Nose: Nose normal.   Eyes:      Extraocular Movements: Extraocular movements intact.      Pupils: Pupils are equal, round, and reactive to light.   Pulmonary:      Effort: Pulmonary effort is normal.      Breath sounds: Normal breath sounds.   Abdominal:      General: Abdomen is flat. Bowel sounds are normal. There is no distension.      Palpations: Abdomen is soft.      Tenderness: There is no abdominal tenderness.   Genitourinary:         Comments: Stage one pressure sore on left buttock.    Bilateral gluteus  with raised dry skin without s/s of infection   Neurological:      Motor: Weakness present.      Gait: Gait abnormal (wheelchair bound).   Psychiatric:         Mood and Affect: Mood normal.         Behavior: Behavior normal.         Thought Content: Thought content normal.         Judgment: Judgment normal.             Assessment:       1. Non-small cell lung cancer, right    2. Bone metastasis    3. Loss of appetite    4. Pressure injury of right buttock, stage 1    5. Earlobe lesion, right    6. Atopic dermatitis, unspecified type    7. Drug-induced constipation          Plan:      1. Keep appointment with oncology as schedule tomorrow  2. Keep appointment with Ortho as schedule Thursday this week. Continue non weight bearing to prevent fracture  3. Trial megace  4. Continue frequent position changes and Medi honey  5. Refilled medication that was prescribed previously from Derm. Told to make appointment with Derm if lesion worsens or persists  6. Trial of topical steroid with moisturizer in between dosing    7. Continue Senna daily    Follow up in 2 weeks for skin check    Orders Placed This Encounter    senna-docusate (PERICOLACE) 8.6-50 MG per tablet    prochlorperazine (COMPAZINE) 10 MG tablet    Hypromellose 0.3 % Solution    folic acid (FOLVITE) 1 MG tablet    megestrol (MEGACE) 40 MG tablet    Clobetasol Prop Emollient Base 0.05 % emollient cream    desoximetasone (TOPICORT) 0.25 % ointment

## 2020-08-28 ENCOUNTER — Encounter (HOSPITAL_COMMUNITY): Payer: Self-pay | Admitting: Hematology & Oncology

## 2020-08-28 ENCOUNTER — Inpatient Hospital Stay (HOSPITAL_COMMUNITY)
Admission: RE | Admit: 2020-08-28 | Discharge: 2020-08-28 | Disposition: A | Discharge status: Still In Hospital | Payer: 59 | Source: Ambulatory Visit | Attending: Hematology & Oncology | Admitting: Hematology & Oncology

## 2020-08-28 ENCOUNTER — Other Ambulatory Visit: Payer: Self-pay

## 2020-08-28 ENCOUNTER — Ambulatory Visit (HOSPITAL_BASED_OUTPATIENT_CLINIC_OR_DEPARTMENT_OTHER): Payer: 59 | Admitting: Hematology & Oncology

## 2020-08-28 VITALS — BP 91/60 | HR 101 | Temp 97.5°F | Resp 20 | Ht 60.98 in | Wt 89.2 lb

## 2020-08-28 DIAGNOSIS — D6481 Anemia due to antineoplastic chemotherapy: Secondary | ICD-10-CM | POA: Insufficient documentation

## 2020-08-28 DIAGNOSIS — C801 Malignant (primary) neoplasm, unspecified: Secondary | ICD-10-CM

## 2020-08-28 DIAGNOSIS — G893 Neoplasm related pain (acute) (chronic): Secondary | ICD-10-CM

## 2020-08-28 DIAGNOSIS — Z7952 Long term (current) use of systemic steroids: Secondary | ICD-10-CM | POA: Insufficient documentation

## 2020-08-28 DIAGNOSIS — C7951 Secondary malignant neoplasm of bone: Secondary | ICD-10-CM

## 2020-08-28 DIAGNOSIS — R269 Unspecified abnormalities of gait and mobility: Secondary | ICD-10-CM

## 2020-08-28 DIAGNOSIS — C3491 Malignant neoplasm of unspecified part of right bronchus or lung: Secondary | ICD-10-CM

## 2020-08-28 DIAGNOSIS — Z87891 Personal history of nicotine dependence: Secondary | ICD-10-CM | POA: Insufficient documentation

## 2020-08-28 DIAGNOSIS — T451X5A Adverse effect of antineoplastic and immunosuppressive drugs, initial encounter: Secondary | ICD-10-CM | POA: Insufficient documentation

## 2020-08-28 DIAGNOSIS — Z79899 Other long term (current) drug therapy: Secondary | ICD-10-CM | POA: Insufficient documentation

## 2020-08-28 DIAGNOSIS — R5381 Other malaise: Secondary | ICD-10-CM

## 2020-08-28 LAB — COMPREHENSIVE METABOLIC PANEL, NON-FASTING
ALBUMIN: 2.6 g/dL — ABNORMAL LOW (ref 3.4–4.8)
ALKALINE PHOSPHATASE: 99 U/L (ref 50–130)
ALT (SGPT): 12 U/L (ref 8–22)
ANION GAP: 6 mmol/L (ref 4–13)
AST (SGOT): 22 U/L (ref 8–45)
BILIRUBIN TOTAL: 0.2 mg/dL — ABNORMAL LOW (ref 0.3–1.3)
BUN/CREA RATIO: 16 (ref 6–22)
BUN: 10 mg/dL (ref 8–25)
CALCIUM: 8.7 mg/dL — ABNORMAL LOW (ref 8.8–10.2)
CHLORIDE: 105 mmol/L (ref 96–111)
CO2 TOTAL: 25 mmol/L (ref 23–31)
CREATININE: 0.64 mg/dL (ref 0.60–1.05)
ESTIMATED GFR: >90 mL/min/BSA (ref >=60)
GLUCOSE: 111 mg/dL (ref 65–125)
POTASSIUM: 4 mmol/L (ref 3.5–5.1)
PROTEIN TOTAL: 6 g/dL (ref 6.0–8.0)
SODIUM: 136 mmol/L (ref 136–145)

## 2020-08-28 LAB — CBC WITH DIFF
HCT: 25.8 % — ABNORMAL LOW (ref 34.8–46.0)
HGB: 8.3 g/dL — ABNORMAL LOW (ref 11.5–16.0)
MCH: 28.1 pg (ref 26.0–32.0)
MCHC: 32.2 g/dL (ref 31.0–35.5)
MCV: 87.5 fL (ref 78.0–100.0)
MPV: 9.6 fL (ref 8.7–12.5)
PLATELETS: 334 10*3/uL (ref 150–400)
RBC: 2.95 10*6/uL — ABNORMAL LOW (ref 3.85–5.22)
RDW-CV: 15.7 % — ABNORMAL HIGH (ref 11.5–15.5)
WBC: 3.7 10*3/uL (ref 3.7–11.0)

## 2020-08-28 LAB — MANUAL DIFF AND MORPHOLOGY-SYSMEX
BASOPHIL #: <0.1 10*3/uL (ref <=0.20)
BASOPHIL %: 0 %
EOSINOPHIL #: <0.1 10*3/uL (ref <=0.50)
EOSINOPHIL %: 1 %
LYMPHOCYTE #: 0.44 10*3/uL — ABNORMAL LOW (ref 1.00–4.80)
LYMPHOCYTE %: 10 %
METAMYELOCYTE %: 2 %
MONOCYTE #: 0.85 10*3/uL (ref 0.20–1.10)
MONOCYTE %: 23 %
NEUTROPHIL #: 2.29 10*3/uL (ref 1.50–7.70)
NEUTROPHIL %: 57 %
NEUTROPHIL BANDS %: 5 %
REACTIVE LYMPHOCYTE %: 2 %

## 2020-08-28 NOTE — Progress Notes (Signed)
Return Patient Progress Note    Date: 08/28/2020    Name: Gina Barrett  MRN: [id number]  DOB: [birth year]   Referring Physician: Self, Referral  Primary Care Provider: Lanier Ensign, FNP-C    Reason for visit/consultation Lung Cancer (Pre-chemo; NSCLC)      Interval History:   65 year old woman with metastatic non-small cell lung cancer who is status post cycle 1 of pemetrexed 8 and carboplatin and returns to clinic today for toxicity evaluation.  She has not been doing very well at home per report by patient and family.  She sleeps a lot and is either sitting of lying in bed more than 90% of the time.  She is also not eating very well and typically does not wake up before 11:00 a.m.Marland Kitchen  She drinks about a bottle of Ensure daily.  She had an episode of fever that did not recur and was not associated with any other new symptoms.  She recently saw her primary care physician and was prescribed may case which she has not started.  Additional review of symptoms are noted below including development of decubitus ulcer which is healing with Medihoney.  Her pain is well controlled and is not contributory according to patient to her severely depressed function.    ROS:     Review of Systems   Constitutional: Positive for appetite change (losing weight, not eating), fatigue and fever (08/19/2020).   Cardiovascular: Positive for leg swelling (bilateral feet and leg swelling after last chemo treatment).   Gastrointestinal: Positive for nausea.        Loose stools w/ difficulty   Musculoskeletal: Positive for back pain and gait problem (wheelchair).   Skin: Positive for wound (buttocks; being treated with Medihoney).   Neurological: Positive for gait problem (wheelchair).   Hematological: Bruises/bleeds easily.   All other systems reviewed and are negative.     ________________________________________________________________________________________________________________________________________________     Hem/Onc Diagnosis:  Non-small cell mod differentiated adenocarcinoma RUL of the lung diagnosed at [facility] on 10/02/2016  -underwent CT lung cancer screening on 05/10/2016 which showed 8 mm ill-defined nodule in the right upper lobe, a 5 mm nodule at the base of the right lower lobe with subpleural scarring in both lungs.  -PET-CT scan on 09/06/2016 showed 10 mm right upper lobe FDG avid lesion that is slightly larger than 2017 CT and concerning for malignancy.  A separate 3 cm long area of tracer uptake in the left lower lobe was noted.  -CT-guided biopsy a [facility] on 10/02/2016 was positive for adenocarcinoma that is well to moderately differentiated.  -Mediastinoscopy 11/13/2016 positive for left mid paratrachael    Stage: IV, initially diagnosed as IIIB     Molecular/special studies:  ROS-1, ALK-1, EGFR, PDL1 all negative    Treatment & Surveillance:   1. S/p chemoradiation with carboplatin + paclitaxel + RT 66.6 Gy completed 02/10/19/18    2. Serial imaging since completion of therapy has been negative for recurrence.  Most recent imaging in on 12/03/2018 was also negative but showed a new T6 compression abnormality.     3. Chest CT with contrast from 09/23/2019 which in summary showed increasing pleural based density in the LLL which may be infectious or inflammatory.  There is increased 1.7cm LN in left hilum ans stable compression abnormality of T6.    4. Biopsy of left LL pleural bases mass on 11/02/2019 showed benign fibrovascular tissue with focal chronic inflammation.  no lung parechyma or malgnancy identified.    5. Patient  developed progressive musculoskeletal pain around October /November of 2021 evaluation with multiple imaging studies highly suggest metastatic disease.  Evaluation for plasma cell was noncontributory.  Patient was referred to orthopedic surgery (Dr. Hervey Ard, by family request and known to patient)  for surgical recommendation especially regarding weight-bearing.    6. Biopsy of paraspinal soft tissue mass  on 07/04/2020 confirmed metastatic carcinoma consistent with lung primary. CARIS NGS negative    7.Radiation Oncology for radiation to the acetabulum as well as the paraspinal mass started 07/19/2020 x 10 , ending 07/31/2020    8. Mediport placed  07/19/2020    9.Begin Zoledronic acid.  Has dentures and no dental eval needed    10. Brain MRI planne2/12/2020     11.  Begin palliative carboplatin & pemetrexed on 08/03/2020         11.     ________________________________________________________________________________________________________________________________________________     Objective   Objective:   BP 91/60 (Site: Left, Patient Position: Sitting)    Pulse (!) 101    Temp 36.4 C (97.5 F) (Thermal Scan)    Resp 20    Ht 1.549 m (5' 0.98") Comment: *   Wt 40.5 kg (89 lb 3.2 oz)    SpO2 97% Comment: ra   BMI 16.86 kg/m       ECOG Status: 3 - Capable of only limited selfcare, confined to bed or chair more than 50% of waking hour.  Physical Exam  Constitutional:       Appearance: She is cachectic. She is ill-appearing. She is not toxic-appearing.   HENT:      Mouth/Throat:      Pharynx: Oropharynx is clear. No oropharyngeal exudate or posterior oropharyngeal erythema.   Eyes:      General: No scleral icterus.  Cardiovascular:      Rate and Rhythm: Normal rate and regular rhythm.      Heart sounds: Normal heart sounds. No murmur heard.  Pulmonary:      Effort: Pulmonary effort is normal.      Breath sounds: Normal breath sounds.   Abdominal:      Palpations: Abdomen is soft.      Tenderness: There is no abdominal tenderness.   Musculoskeletal:         General: No tenderness.      Right lower leg: No edema.      Left lower leg: No edema.   Skin:     Coloration: Skin is jaundiced.   Neurological:      Motor: Weakness present.      Gait: Gait abnormal.      Comments: Patient was barely able to stand.  When she stood up with my help, her gait was exceedingly short shuffling.  She is unable to get on examination table.           Past Medical History:   Diagnosis Date    Abnormal Pap smear     ASCUS, ASCUS cannot rule out HGSIL    Cancer (CMS HCC)     non small cell lung cancer (R)-s/p Chemo/Radiation completed 8/18    Colon polyp     Dysphagia     Hypothyroid     Non-small cell lung cancer, right (CMS HCC)     s/p Chemo completed 9/18    Raynaud's disease     STD (sexually transmitted disease)     HSV    Unspecified disorder of thyroid     Hypothyroidism  Current Outpatient Medications   Medication Sig    Artifi. Tear, Hypromellose,-PF (RETAINE HPMC, PF,) 0.3 % Ophthalmic Drops Administer into affected eye(s) Once per day as needed    carboxymethyl/glycerin/poly80 (REFRESH OPTIVE ADVANCED OPHT) Apply to eye Once per day as needed    carboxymethylcellulose Sodium (THERATEARS) 0.25 % Ophthalmic Drops 1-2 Drops Once per day as needed    dexAMETHasone (DECADRON) 4 mg Oral Tablet Take 1 Tablet (4 mg total) by mouth Twice daily for 3 days after each cycle of chemo    folic acid (FOLVITE) 1 mg Oral Tablet Take 1 Tablet (1 mg total) by mouth Once a day    levothyroxine (SYNTHROID) 88 mcg Oral Tablet TAKE ONE TABLET BY MOUTH EVERY DAY    loratadine (CLARITIN) 10 mg Oral Tablet take 10 mg by mouth Once a day.    MEDIHONEY, HONEY, 80 % Gel Twice daily to affected area(s)    megestroL (MEGACE) 40 mg Oral Tablet Take 40 mg by mouth    MULTIVITAMIN ORAL Take by mouth Once a day Centrum Silver Women's 50+    nebivoloL (BYSTOLIC) 5 mg Oral Tablet TAKE ONE TABLET BY MOUTH EVERY DAY (Patient not taking: Reported on 08/28/2020)    Nifedipine (ADALAT CC) 30 mg Oral Tablet Sustained Release take 30 mg by mouth Once a day.    OMEGA-3 FATTY ACIDS (FISH OIL ORAL) take  by mouth Once a day. (Patient not taking: Reported on 08/28/2020)    ondansetron (ZOFRAN ODT) 8 mg Oral Tablet, Rapid Dissolve Place 1 Tablet (8 mg total) under the tongue Every 8 hours as needed for Nausea/Vomiting Indications: nausea and vomiting caused by  cancer drugs    oxyCODONE-acetaminophen (PERCOCET) 10-325 mg Oral Tablet Take 1 Tablet by mouth Every 6 hours as needed for Pain for up to 30 days    prochlorperazine (COMPAZINE) 10 mg Oral Tablet Take 1 Tablet (10 mg total) by mouth Four times a day as needed for Nausea/Vomiting    sennosides-docusate sodium (SENOKOT-S) 8.6-50 mg Oral Tablet Take 1 Tablet by mouth Every evening    tiotropium-olodateroL (STIOLTO RESPIMAT) 2.5-2.5 mcg/actuation Inhalation Mist Take 2 INHALATION by inhalation Once a day    UNKNOWN MEDICATION (UNKNOWN MEDICATION) Administer into affected eye(s) [name] 128 Eye Drops OTC 4x daily  [name] 128 Eye Gel OTC Nightly     Allergies as of 08/28/2020 - Reviewed 08/28/2020   Allergen Reaction Noted    Penicillins  08/15/2010       Social History  Occupation:   Social History     Occupational History     Employer: [employer] [place] [id number]   reports that she quit smoking about 21 years ago. Her smoking use included cigarettes. She started smoking about 49 years ago. She quit after 20.00 years of use. She has never used smokeless tobacco. She reports being sexually active and has had partner(s) who are female. She reports using the following method of birth control/protection: None. She reports that she does not drink alcohol and does not use drugs.    Labs:  Labs reviewed and interpreted:  Results for orders placed or performed during the hospital encounter of 08/28/20 (from the past 96 hour(s))   COMPREHENSIVE METABOLIC PANEL, NON-FASTING   Result Value Ref Range    SODIUM 136 136 - 145 mmol/L    POTASSIUM 4.0 3.5 - 5.1 mmol/L    CHLORIDE 105 96 - 111 mmol/L    CO2 TOTAL 25 23 - 31  mmol/L    ANION GAP 6 4 - 13 mmol/L    BUN 10 8 - 25 mg/dL    CREATININE 0.64 0.60 - 1.05 mg/dL    BUN/CREA RATIO 16 6 - 22    ESTIMATED GFR >90 >=60 mL/min/BSA    ALBUMIN 2.6 (L) 3.4 - 4.8 g/dL     CALCIUM 8.7 (L) 8.8 - 10.2 mg/dL    GLUCOSE 111 65 - 125 mg/dL    ALKALINE PHOSPHATASE 99 50  - 130 U/L    ALT (SGPT) 12 8 - 22 U/L    AST (SGOT)  22 8 - 45 U/L    BILIRUBIN TOTAL 0.2 (L) 0.3 - 1.3 mg/dL    PROTEIN TOTAL 6.0 6.0 - 8.0 g/dL   CBC/DIFF    Narrative    The following orders were created for panel order CBC/DIFF.  Procedure                               Abnormality         Status                     ---------                               -----------         ------                     CBC WITH BSJG[[id number]]                Abnormal            Final result               MANUAL DIFF AND MORPHOLO.Marland KitchenMarland Kitchen[[id number]]  Abnormal            Final result                 Please view results for these tests on the individual orders.   CBC WITH DIFF   Result Value Ref Range    WBC 3.7 3.7 - 11.0 x103/uL    RBC 2.95 (L) 3.85 - 5.22 x106/uL    HGB 8.3 (L) 11.5 - 16.0 g/dL    HCT 25.8 (L) 34.8 - 46.0 %    MCV 87.5 78.0 - 100.0 fL    MCH 28.1 26.0 - 32.0 pg    MCHC 32.2 31.0 - 35.5 g/dL    RDW-CV 15.7 (H) 11.5 - 15.5 %    PLATELETS 334 150 - 400 x103/uL    MPV 9.6 8.7 - 12.5 fL   MANUAL DIFF AND MORPHOLOGY-SYSMEX   Result Value Ref Range    NEUTROPHIL % 57 %    LYMPHOCYTE %  10 %    MONOCYTE % 23 %    EOSINOPHIL % 1 %    BASOPHIL % 0 %    NEUTROPHIL BANDS % 5 %    METAMYELOCYTE %  2 %    REACTIVE LYMPHOCYTE % 2 %    NEUTROPHIL # 2.29 1.50 - 7.70 x103/uL    LYMPHOCYTE # 0.44 (L) 1.00 - 4.80 x103/uL    MONOCYTE # 0.85 0.20 - 1.10 x103/uL    EOSINOPHIL # <0.10 <=0.50 x103/uL    BASOPHIL # <0.10 <=0.20 x103/uL    PLATELET CLUMPS Present (A) None  Radiology: All recent pertinent radiologic images and reports reviewed          Assessment/Plan:   1. Non-small cell lung cancer, right (CMS [place])  65 year old woman with metastatic non-small cell lung cancer who is status post cycle 1 of carboplatin and methotrexate with significant decline in her performance status.  She is currently not a candidate for continuation of chemotherapy unless her function improves.  I discussed this in details with patient and family.   Particularly concerning his had diminished food intake for which I have instructed her to begin the Megace prescribed by her primary care physician as soon as today.  I will delay her chemotherapy for a week and if there is no improvement in her performance status and food intake, we will have to discuss hospice.  We discussed and will ask for home health.  Her reported decubitus ulcer is improving.  I was unable to examine her buttocks area today because she could not get on examination table      2. Bone metastasis (CMS HCC)  Pain is controlled.  I will hold off on starting has zoledronic acid until we determine that she can continue chemotherapy.  If going to hospice, this treatment would not be necessary.    3. Antineoplastic chemotherapy induced anemia  Her anemia is slightly worse due to recent chemotherapy but does not need transfusion at this time.  Expected to improve with delay in chemotherapy.    4. Functional gait disorder  - PT/Home health  - Refer to [place]; Future    5. Physical deconditioning  - PT/Home health  - Refer to [place]; Future    6. Cancer associated pain  Controlled      Return in about 2 weeks (around 09/11/2020).    Mertie Moores, MD        CC:  PCP General:  Lanier Ensign, FNP-C  [place] [address]  [place] [address]      Portions of this note may be dictated using voice recognition software or a dictation service. Variances in spelling and vocabulary are possible and unintentional. Not all errors are caught/corrected. Please notify the [facility] if any discrepancies are noted or if the meaning of any statement is not clear.

## 2020-08-29 ENCOUNTER — Other Ambulatory Visit (INDEPENDENT_AMBULATORY_CARE_PROVIDER_SITE_OTHER): Payer: Self-pay | Admitting: Family

## 2020-08-29 ENCOUNTER — Telehealth (INDEPENDENT_AMBULATORY_CARE_PROVIDER_SITE_OTHER): Payer: Self-pay

## 2020-08-29 DIAGNOSIS — C3491 Malignant neoplasm of unspecified part of right bronchus or lung: Secondary | ICD-10-CM

## 2020-08-29 DIAGNOSIS — L209 Atopic dermatitis, unspecified: Secondary | ICD-10-CM

## 2020-08-29 DIAGNOSIS — R630 Anorexia: Secondary | ICD-10-CM

## 2020-08-29 DIAGNOSIS — C7951 Secondary malignant neoplasm of bone: Secondary | ICD-10-CM

## 2020-08-29 DIAGNOSIS — L89311 Pressure ulcer of right buttock, stage 1: Secondary | ICD-10-CM

## 2020-08-29 DIAGNOSIS — Z993 Dependence on wheelchair: Secondary | ICD-10-CM

## 2020-08-29 NOTE — Progress Notes (Signed)
SW Note:  Faxed home health order for PT to [facility].  Cheree Ditto, SOCIAL WORKER

## 2020-08-29 NOTE — Telephone Encounter (Signed)
I put order in    Marya Landry, FNP

## 2020-08-30 ENCOUNTER — Inpatient Hospital Stay
Admission: RE | Admit: 2020-08-30 | Discharge: 2020-08-30 | Disposition: A | Discharge status: Still In Hospital | Payer: 59 | Source: Ambulatory Visit | Attending: Hematology & Oncology | Admitting: Hematology & Oncology

## 2020-08-30 ENCOUNTER — Encounter (HOSPITAL_COMMUNITY): Payer: Self-pay | Admitting: Hematology & Oncology

## 2020-08-30 NOTE — Discharge Instructions (Signed)
Patient Education     Zoledronic Acid injection (Hypercalcemia, Oncology)  Brand Name: Zometa  What is this medicine?  ZOLEDRONIC ACID (ZOE le dron ik AS id) lowers the amount of calcium loss from bone. It is used to treat too much calcium in your blood from cancer. It is also used to prevent complications of cancer that has spread to the bone.  How should I use this medicine?  This medicine is for infusion into a vein. It is given by a health care professional in a hospital or clinic setting.  Talk to your pediatrician regarding the use of this medicine in children. Special care may be needed.  What side effects may I notice from receiving this medicine?  Side effects that you should report to your doctor or health care professional as soon as possible:   allergic reactions like skin rash, itching or hives, swelling of the face, lips, or tongue   anxiety, confusion, or depression   breathing problems   changes in vision   eye pain   feeling faint or lightheaded, falls   jaw pain, especially after dental work   mouth sores   muscle cramps, stiffness, or weakness   redness, blistering, peeling or loosening of the skin, including inside the mouth   trouble passing urine or change in the amount of urine  Side effects that usually do not require medical attention (report to your doctor or health care professional if they continue or are bothersome):   bone, joint, or muscle pain   constipation   diarrhea   fever   hair loss   irritation at site where injected   loss of appetite   nausea, vomiting   stomach upset   trouble sleeping   trouble swallowing   weak or tired  What may interact with this medicine?   certain antibiotics given by injection   NSAIDs, medicines for pain and inflammation, like ibuprofen or naproxen   some diuretics like bumetanide, furosemide   teriparatide   thalidomide    What if I miss a dose?  It is important not to miss your dose. Call your doctor or health care  professional if you are unable to keep an appointment.  Where should I keep my medicine?  This drug is given in a hospital or clinic and will not be stored at home.  What should I tell my health care provider before I take this medicine?  They need to know if you have any of these conditions:   aspirin-sensitive asthma   cancer, especially if you are receiving medicines used to treat cancer   dental disease or wear dentures   infection   kidney disease   receiving corticosteroids like dexamethasone or prednisone   an unusual or allergic reaction to zoledronic acid, other medicines, foods, dyes, or preservatives   pregnant or trying to get pregnant   breast-feeding  What should I watch for while using this medicine?  Visit your doctor or health care professional for regular checkups. It may be some time before you see the benefit from this medicine. Do not stop taking your medicine unless your doctor tells you to. Your doctor may order blood tests or other tests to see how you are doing.  Women should inform their doctor if they wish to become pregnant or think they might be pregnant. There is a potential for serious side effects to an unborn child. Talk to your health care professional or pharmacist for more information.  You   should make sure that you get enough calcium and vitamin D while you are taking this medicine. Discuss the foods you eat and the vitamins you take with your health care professional.  Some people who take this medicine have severe bone, joint, and/or muscle pain. This medicine may also increase your risk for jaw problems or a broken thigh bone. Tell your doctor right away if you have severe pain in your jaw, bones, joints, or muscles. Tell your doctor if you have any pain that does not go away or that gets worse.  Tell your dentist and dental surgeon that you are taking this medicine. You should not have major dental surgery while on this medicine. See your dentist to have a dental  exam and fix any dental problems before starting this medicine. Take good care of your teeth while on this medicine. Make sure you see your dentist for regular follow-up appointments.  NOTE:This sheet is a summary. It may not cover all possible information. If you have questions about this medicine, talk to your doctor, pharmacist, or health care provider. Copyright 2021 [employer]

## 2020-08-30 NOTE — Progress Notes (Signed)
This Probation officer called and left a message on the patient's phon for her to call back and schedule her chemo appointment.

## 2020-08-31 NOTE — Progress Notes (Signed)
SW Note: [name] called requesting records.  Patient records faxed.  Cheree Ditto, SOCIAL WORKER

## 2020-09-02 ENCOUNTER — Emergency Department
Admission: EM | Admit: 2020-09-02 | Discharge: 2020-09-02 | Disposition: A | Discharge status: Home / Self Care | Payer: 59 | Attending: Emergency Medicine | Admitting: Emergency Medicine

## 2020-09-02 ENCOUNTER — Emergency Department (HOSPITAL_COMMUNITY): Payer: 59

## 2020-09-02 ENCOUNTER — Encounter (HOSPITAL_COMMUNITY): Payer: Self-pay

## 2020-09-02 ENCOUNTER — Other Ambulatory Visit: Payer: Self-pay

## 2020-09-02 DIAGNOSIS — Z9221 Personal history of antineoplastic chemotherapy: Secondary | ICD-10-CM | POA: Insufficient documentation

## 2020-09-02 DIAGNOSIS — Z87891 Personal history of nicotine dependence: Secondary | ICD-10-CM | POA: Insufficient documentation

## 2020-09-02 DIAGNOSIS — C7951 Secondary malignant neoplasm of bone: Secondary | ICD-10-CM | POA: Insufficient documentation

## 2020-09-02 DIAGNOSIS — K5900 Constipation, unspecified: Secondary | ICD-10-CM | POA: Insufficient documentation

## 2020-09-02 MED ORDER — POLYETHYLENE GLYCOL 3350 17 GRAM ORAL POWDER PACKET
17.0000 g | Freq: Every day | ORAL | Status: DC
Start: 2020-09-02 — End: 2021-03-06

## 2020-09-02 NOTE — ED Nurses Note (Signed)
Soap suds enema administered. Patient tolerated well. Patient placed on bedpan. Awaiting results.

## 2020-09-02 NOTE — ED Nurses Note (Signed)
Large amount of brown hard stool passed by patient. Patient tolerated well.     MD at bedside for reevaluation.

## 2020-09-02 NOTE — ED Provider Notes (Signed)
Mindi Junker, MD  [facility]  Emergency Department Visit Note    Date:  09/02/2020  Primary care provider:  Lanier Ensign, FNP-C  Means of arrival:  private car  History obtained from: patient  History limited by: none    Chief Complaint:  Abdominal Pain    HISTORY OF PRESENT ILLNESS     Gina Barrett, date of birth [birth year], is a 65 y.o. female who presents to the Emergency Department complaining of abdominal pain, onset this morning. Patient woke up this morning with abdominal pain and has been having trouble producing bowel movements. 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 She did take Miralax this morning. Per husband, it has been about a week since she has had a good bowel movement. Patient denies vomiting, fever, or blood in urine or stool.     REVIEW OF SYSTEMS     The pertinent positive and negative symptoms are as per HPI. All other systems reviewed and are negative.     PATIENT HISTORY     Past Medical History:  Past Medical History:   Diagnosis Date    Abnormal Pap smear     ASCUS, ASCUS cannot rule out HGSIL    Cancer (CMS HCC)     non small cell lung cancer (R)-s/p Chemo/Radiation completed 8/18    Colon polyp     Dysphagia     Hypothyroid     Non-small cell lung cancer, right (CMS HCC)     s/p Chemo completed 9/18    Raynaud's disease     STD (sexually transmitted disease)     HSV    Unspecified disorder of thyroid     Hypothyroidism       Past Surgical History:  Past Surgical History:   Procedure Laterality Date    Hx cataract removal Bilateral 2011    Hx colonoscopy  2009    Hx elbow surgery      Hx foot surgery      Hx tubal ligation  1978    Lung biopsy Right 10/02/2016    Mediastinoscopy  11/13/2016    Pb forearm/wrist surgery unlisted  1999    Portacath placement  12/12/2016       Family History:  Family Medical History:     Problem Relation (Age of Onset)    Breast Cancer Mother    Diabetes Paternal  Grandfather, Brother    No Known Problems Father, Sister, Maternal Grandmother, Maternal Grandfather, Paternal 51, Daughter, Son, Maternal Aunt, Maternal Uncle, Paternal 62, Paternal Uncle, Other          Social History:  Social History     Tobacco Use    Smoking status: Former Smoker     Years: 20.00     Types: Cigarettes     Start date: 12/09/1970     Quit date: 09/08/1998     Years since quitting: 22.0    Smokeless tobacco: Never Used   Substance Use Topics    Alcohol use: No    Drug use: No     Social History     Substance and Sexual Activity   Drug Use No       Medications:  Current Outpatient Medications   Medication Sig    Artifi. Tear, Hypromellose,-PF (RETAINE [facility], PF,) 0.3 % Ophthalmic Drops Administer into affected eye(s) Once per day as needed    carboxymethyl/glycerin/poly80 (REFRESH OPTIVE ADVANCED OPHT) Apply to eye Once per  day as needed    carboxymethylcellulose Sodium (THERATEARS) 0.25 % Ophthalmic Drops 1-2 Drops Once per day as needed    folic acid (FOLVITE) 1 mg Oral Tablet Take 1 Tablet (1 mg total) by mouth Once a day    levothyroxine (SYNTHROID) 88 mcg Oral Tablet TAKE ONE TABLET BY MOUTH EVERY DAY    loratadine (CLARITIN) 10 mg Oral Tablet take 10 mg by mouth Once a day.    MEDIHONEY, HONEY, 80 % Gel Twice daily to affected area(s)    megestroL (MEGACE) 40 mg Oral Tablet Take 40 mg by mouth    MULTIVITAMIN ORAL Take by mouth Once a day Centrum Silver Women's 50+    nebivoloL (BYSTOLIC) 5 mg Oral Tablet TAKE ONE TABLET BY MOUTH EVERY DAY (Patient not taking: Reported on 08/28/2020)    Nifedipine (ADALAT CC) 30 mg Oral Tablet Sustained Release take 30 mg by mouth Once a day.    OMEGA-3 FATTY ACIDS (FISH OIL ORAL) take  by mouth Once a day. (Patient not taking: Reported on 08/28/2020)    ondansetron (ZOFRAN ODT) 8 mg Oral Tablet, Rapid Dissolve Place 1 Tablet (8 mg total) under the tongue Every 8 hours as needed for Nausea/Vomiting Indications: nausea and vomiting  caused by cancer drugs    oxyCODONE-acetaminophen (PERCOCET) 10-325 mg Oral Tablet Take 1 Tablet by mouth Every 6 hours as needed for Pain for up to 30 days    prochlorperazine (COMPAZINE) 10 mg Oral Tablet Take 1 Tablet (10 mg total) by mouth Four times a day as needed for Nausea/Vomiting    sennosides-docusate sodium (SENOKOT-S) 8.6-50 mg Oral Tablet Take 1 Tablet by mouth Every evening    tiotropium-olodateroL (STIOLTO RESPIMAT) 2.5-2.5 mcg/actuation Inhalation Mist Take 2 INHALATION by inhalation Once a day    UNKNOWN MEDICATION (UNKNOWN MEDICATION) Administer into affected eye(s) [name] 128 Eye Drops OTC 4x daily  [name] 128 Eye Gel OTC Nightly         Allergies:  Allergies   Allergen Reactions    Penicillins        PHYSICAL EXAM     Vitals:  Filed Vitals:    09/02/20 0637   BP: 128/60   Pulse: (!) 110   Resp: 20   Temp: 36.9 C (98.4 F)   SpO2: 94%       Pulse ox  94% on None (Room Air) interpreted by me as: Normal    Constitutional: Intermittent. Nontoxic. No acute distress.   Head: Normocephalic and atraumatic.   ENT: Moist mucous membranes. No erythema or exudates in the oropharynx. Normal voice.    Eyes: EOM are normal. Pupils are equal, round, and reactive to light. No scleral icterus.   Neck: Neck supple. FROM neck.  Cardiovascular: Normal rate and regular rhythm. No murmur heard. 2+ distal pulses all 4 extremities.  Pulmonary/Chest: Effort normal and breath sounds normal.   Abdominal: Soft with no distension. No abdominal tenderness  Back: There is no CVA tenderness.   Musculoskeletal:  No clubbing or cyanosis.  Neurological: Patient is alert and awake. Strength and sensation normal in all extremities. Normal facial symmetry and speech.   Skin: Skin is warm and dry.     DIAGNOSTIC STUDIES     Labs:  No results found for any visits on 09/02/20.  Labs reviewed and interpreted by me.    Radiology:    XR KUB   Final Result   1. Constipation plus or minus rectal fecal impaction.  Radiologist  location ID: [id number]           Radiological imaging interpreted by radiologist and independently reviewed by me.    ED PROGRESS NOTE / [place] records reviewed by me:  I have reviewed the nurse's notes. I have reviewed the patient's problem list and pertinent past medical records.    Orders Placed This Encounter    XR KUB          06:37: Initial evaluation is complete at this time. I discussed with the patient that I would order an XR KUB to further evaluate. Patient is agreeable with the treatment plan at this time.     07:54: Patient is resting comfortably in bed on re-check.    09:20: On recheck, the patient is resting comfortably in bed. I explained the results of the diagnostic studies. Patient will be discharged with Miralax. Patient is to follow up with Lanier Ensign, FNP-C. I discussed the diagnosis, disposition, and follow-up plan. The patient understood and is in accordance with the treatment plan at this time. Patient is to return here to the Emergency Department if new or worsening symptoms appear. All of their questions have been answered to their satisfaction. The patient is in stable condition at the time of discharge.     65 year old female with past medical history of cancer presents with abdominal pain.  The patient has no surgical abdomen on exam.  Patient has constipation on x-ray.  The patient had significant relief with enema in the emergency department.  I recommend she start MiraLax and take it regularly to keep herself regular.  She is already taking stool softeners.  If the patient's symptoms worsen she can return anytime to the emergency department.    MIPS      Not applicable     OPIATE PRESCRIPTION       Not applicable    CORE MEASURES      Not applicable    CRITICAL CARE TIME      Not applicable     PRE-DISPOSITION VITALS        Pre-Disposition Vitals:  Filed Vitals:    09/02/20 0637 09/02/20 0830   BP: 128/60 (!) 144/51   Pulse: (!) 110 71   Resp: 20 17    Temp: 36.9 C (98.4 F) 36.7 C (98 F)   SpO2: 94% 99%           CLINICAL IMPRESSION     Encounter Diagnosis   Name Primary?    Constipation Yes       DISPOSITION/PLAN     Discharged        Prescriptions:     Current Discharge Medication List      START taking these medications    Details   polyethylene glycol (MIRALAX) 17 gram Oral Powder in Packet Take 1 Packet (17 g total) by mouth Once a day             Follow-Up:    Lanier Ensign, [facility]  [facility]  [address]  [address]  [place] [address]  [phone]            Condition at Disposition: Stable        SCRIBE [place], SCRIBE scribed for Mindi Junker, MD on 09/02/2020 at 6:35 AM.     Documentation assistance provided for Mindi Junker, MD  by Lew Dawes, [place]. Information recorded by the  scribe was done at my direction and has been reviewed and validated by me Mindi Junker, MD.

## 2020-09-02 NOTE — ED Triage Notes (Signed)
Patient states she awoke with this am with abdominal pain.  Patient is currently undergoing chemo from metastatic CA that is currently in her hip bones.

## 2020-09-02 NOTE — ED Nurses Note (Signed)
Discharge instructions and Rx given to patient. Patient verbalized understanding. NAND. VSS. AAOx4 at d/c. Patient assisted getting dressed and assisted into personal wheelchair. Patient's husband wheeled patient to car.

## 2020-09-04 ENCOUNTER — Encounter (INDEPENDENT_AMBULATORY_CARE_PROVIDER_SITE_OTHER): Payer: Self-pay

## 2020-09-04 NOTE — Progress Notes (Signed)
Scanned

## 2020-09-04 NOTE — Telephone Encounter (Signed)
Called [facility] and left message for brenda cloud in regard to pelvic exercises for patient

## 2020-09-06 ENCOUNTER — Other Ambulatory Visit: Payer: Self-pay

## 2020-09-06 ENCOUNTER — Inpatient Hospital Stay
Admission: RE | Admit: 2020-09-06 | Discharge: 2020-09-06 | Disposition: A | Discharge status: Still In Hospital | Payer: 59 | Source: Ambulatory Visit | Attending: Hematology & Oncology | Admitting: Hematology & Oncology

## 2020-09-06 VITALS — BP 139/71 | HR 125 | Resp 34 | Ht 60.98 in | Wt 88.4 lb

## 2020-09-06 DIAGNOSIS — C801 Malignant (primary) neoplasm, unspecified: Secondary | ICD-10-CM

## 2020-09-06 DIAGNOSIS — C3490 Malignant neoplasm of unspecified part of unspecified bronchus or lung: Secondary | ICD-10-CM

## 2020-09-06 DIAGNOSIS — C3491 Malignant neoplasm of unspecified part of right bronchus or lung: Secondary | ICD-10-CM | POA: Insufficient documentation

## 2020-09-06 DIAGNOSIS — Z5111 Encounter for antineoplastic chemotherapy: Secondary | ICD-10-CM | POA: Insufficient documentation

## 2020-09-06 DIAGNOSIS — C7951 Secondary malignant neoplasm of bone: Secondary | ICD-10-CM | POA: Insufficient documentation

## 2020-09-06 LAB — CBC W/AUTO DIFF
HCT: 26.6 % — ABNORMAL LOW (ref 34.8–46.0)
HGB: 8.6 g/dL — ABNORMAL LOW (ref 11.5–16.0)
MCH: 28.2 pg (ref 26.0–32.0)
MCHC: 32.3 g/dL (ref 31.0–35.5)
MCV: 87.2 fL (ref 78.0–100.0)
MPV: 8.8 fL (ref 8.7–12.5)
PLATELETS: 469 10*3/uL — ABNORMAL HIGH (ref 150–400)
RBC: 3.05 10*6/uL — ABNORMAL LOW (ref 3.85–5.22)
RDW-CV: 17.2 % — ABNORMAL HIGH (ref 11.5–15.5)
WBC: 6.7 10*3/uL (ref 3.7–11.0)

## 2020-09-06 LAB — MANUAL DIFF AND MORPHOLOGY-SYSMEX
BASOPHIL #: <0.1 10*3/uL (ref <=0.20)
BASOPHIL %: 0 %
EOSINOPHIL #: <0.1 10*3/uL (ref <=0.50)
EOSINOPHIL %: 0 %
LYMPHOCYTE #: 0.94 10*3/uL — ABNORMAL LOW (ref 1.00–4.80)
LYMPHOCYTE %: 14 %
METAMYELOCYTE %: 3 %
MONOCYTE #: 1.01 10*3/uL (ref 0.20–1.10)
MONOCYTE %: 15 %
NEUTROPHIL #: 4.56 10*3/uL (ref 1.50–7.70)
NEUTROPHIL %: 63 %
NEUTROPHIL BANDS %: 5 %
RBC MORPHOLOGY: NORMAL

## 2020-09-06 LAB — COMPREHENSIVE METABOLIC PANEL, NON-FASTING
ALBUMIN: 2.7 g/dL — ABNORMAL LOW (ref 3.4–4.8)
ALKALINE PHOSPHATASE: 109 U/L (ref 50–130)
ALT (SGPT): 13 U/L (ref 8–22)
ANION GAP: 9 mmol/L (ref 4–13)
AST (SGOT): 24 U/L (ref 8–45)
BILIRUBIN TOTAL: 0.3 mg/dL (ref 0.3–1.3)
BUN/CREA RATIO: 17 (ref 6–22)
BUN: 11 mg/dL (ref 8–25)
CALCIUM: 9.1 mg/dL (ref 8.8–10.2)
CHLORIDE: 106 mmol/L (ref 96–111)
CO2 TOTAL: 23 mmol/L (ref 23–31)
CREATININE: 0.66 mg/dL (ref 0.60–1.05)
ESTIMATED GFR: >90 mL/min/BSA (ref >=60)
GLUCOSE: 115 mg/dL (ref 65–125)
POTASSIUM: 4.2 mmol/L (ref 3.5–5.1)
PROTEIN TOTAL: 6.5 g/dL (ref 6.0–8.0)
SODIUM: 138 mmol/L (ref 136–145)

## 2020-09-06 MED ORDER — SODIUM CHLORIDE 0.9% FLUSH BAG - 250 ML
INTRAVENOUS | Status: DC | PRN
Start: 2020-09-06 — End: 2020-09-07

## 2020-09-06 MED ORDER — ZOLEDRONIC ACID 4 MG/5 ML INTRAVENOUS SOLUTION
3.5000 mg | Freq: Once | INTRAVENOUS | Status: AC
Start: 2020-09-06 — End: 2020-09-06
  Administered 2020-09-06: 16:00:00 0 mg via INTRAVENOUS
  Administered 2020-09-06: 16:00:00 3.5 mg via INTRAVENOUS
  Filled 2020-09-06: qty 4.38

## 2020-09-06 MED ORDER — SODIUM CHLORIDE 0.9 % INTRAVENOUS SOLUTION
12.0000 mg | Freq: Once | INTRAVENOUS | Status: AC
Start: 2020-09-06 — End: 2020-09-06
  Administered 2020-09-06: 0 mg via INTRAVENOUS
  Administered 2020-09-06: 14:00:00 12 mg via INTRAVENOUS
  Filled 2020-09-06: qty 3

## 2020-09-06 MED ORDER — SODIUM CHLORIDE 0.9 % INTRAVENOUS SOLUTION
150.0000 mg | Freq: Once | INTRAVENOUS | Status: AC
Start: 2020-09-06 — End: 2020-09-06
  Administered 2020-09-06: 150 mg via INTRAVENOUS
  Administered 2020-09-06: 0 mg via INTRAVENOUS
  Filled 2020-09-06: qty 5

## 2020-09-06 MED ORDER — SODIUM CHLORIDE 0.9 % (FLUSH) INJECTION SYRINGE
10.0000 mL | INJECTION | Freq: Once | INTRAMUSCULAR | Status: AC
Start: 2020-09-06 — End: 2020-09-06
  Administered 2020-09-06: 10 mL

## 2020-09-06 MED ORDER — SODIUM CHLORIDE 0.9 % INTRAVENOUS SOLUTION
500.0000 mg/m2 | Freq: Once | INTRAVENOUS | Status: AC
Start: 2020-09-06 — End: 2020-09-06
  Administered 2020-09-06: 15:00:00 0 mg via INTRAVENOUS
  Administered 2020-09-06: 15:00:00 650 mg via INTRAVENOUS
  Filled 2020-09-06: qty 20

## 2020-09-06 MED ORDER — HEPARIN, PORCINE (PF) 100 UNIT/ML INTRAVENOUS SYRINGE
5.0000 mL | INJECTION | Freq: Once | INTRAVENOUS | Status: AC
Start: 2020-09-06 — End: 2020-09-06
  Administered 2020-09-06: 5 mL
  Filled 2020-09-06 (×2): qty 5

## 2020-09-06 MED ORDER — SODIUM CHLORIDE 0.9 % INTRAVENOUS SOLUTION
378.5000 mg | Freq: Once | INTRAVENOUS | Status: AC
Start: 2020-09-06 — End: 2020-09-06
  Administered 2020-09-06: 380 mg via INTRAVENOUS
  Administered 2020-09-06: 16:00:00 0 mg via INTRAVENOUS
  Filled 2020-09-06: qty 38

## 2020-09-06 MED ORDER — PALONOSETRON 0.25 MG/5 ML INTRAVENOUS SOLUTION
0.2500 mg | Freq: Once | INTRAVENOUS | Status: AC
Start: 2020-09-06 — End: 2020-09-06
  Administered 2020-09-06: 0.25 mg via INTRAVENOUS
  Filled 2020-09-06: qty 5

## 2020-09-06 NOTE — Discharge Instructions (Signed)
Patient Education     Discharge Instructions for Chemotherapy  Your healthcare provider prescribed a type of medicine therapy for you called chemotherapy. It's also known as chemo. Chemo is used for many different types of illnesses, including cancer. There are many types of chemo. This sheet gives some general information on how you can take care ofyourself after your chemo. Talk with your healthcare provider about other details based on your own treatment plan.  Mouth care  You may get mouth sores, even if you follow all of your healthcare provider's instructions. Many people get mouth sores as a side effect of chemo. Here's what you can do to help prevent mouth sores:   Keep your mouth clean. Brush your teeth with a soft-bristle toothbrush after every meal.   Ask if you should use a toothpaste with fluoride. Or you may be told to brush your teeth with a mixture of 1 teaspoon of salt in 8 ounces of water.   Use an oral swab or special soft toothbrush if your gums bleed during brushing.   Don't use dental floss if it causes your gums to bleed.   Use any mouthwash given to you as directed.   If you can't tolerate regular methods, use salt and baking soda to clean your mouth. Mix1teaspoonof salt and1teaspoon of baking soda in 1 quart of warm water. Swish and spit.   If you wear dentures, you may be told to wear them only when you eat. Ask your healthcare provider. Clean your dentures twice a day. Soak them in antimicrobial solution when you aren't wearing them. Rinse your mouth after each meal.   Check your mouth and tonguefor white patches. Thismay bea sign of a type of yeast infection called thrush. This is a common side effect of chemo. Tell your healthcare provider if you get these patches. He or she can prescribe medicine to treat them.  Other self-care  Here's what else you can do:   Try to exercise. Exercise keeps you strong and keeps your heart and lungs active. Walking and yoga are good  types of exercise.   Keep clean. During chemo, your body can't fight infection very well. Take short baths or showers. Wash your hands before you eat and after going to the bathroom.   Avoid people who are sick with an illness you could catch. This includes people with colds, flu, measles, or chicken pox. This also includes people who have recently had a vaccine for any illness.   Let your healthcare provider know if your throat is sore. You may have an infection that needs treatment.   Be gentle to your skin. Use moisturizing soap. Chemo can make your skin dry. Apply lotion several times a day to help relieve dry skin. Don't take very hot or very cold showers or baths.   Don't be surprised if your chemo causes slight burns to your skin. This can happen most often on the hands and feet. Some medicines used in high doses cause this. Ask for a special cream to help relieve the burn and protect your skin.  Many people on chemo feel sick and find it hard to eat during treatment. Try these tips:   Eat small meals several times a day to keep your strength up.   Choose bland foods with little taste or smell if you are reacting strongly to food.   Be sure to cook all food fully. This kills bacteria and helps you prevent illness.   Eat foods that  are soft. Soft foods are less likely to cause stomach irritation.   Try to eat a variety of foods for a well-balanced diet. Drink plenty of fluids. Eat foods with fiber unless your healthcare provider says not to. Fiber can help to prevent constipation.  When to call your healthcare provider  Call your healthcare provider right away if you have any of the following:   Unexplained bleeding   Trouble concentrating   Ongoing fatigue   Shortness of breath, wheezing, trouble breathing, or bad cough   Rapid, irregular heartbeat, or chest pain   Dizziness, lightheadedness   Constant feeling of being cold   Hives oracut or rash that swells, turns red, feels hot or  painful, or begins to ooze   Burning when you urinate   Feverof100.66F (38C) orhigher, oras directed by your healthcare provider  [employer] last reviewed this educational content on 10/14/2017   2000-2021 [employer]. All rights reserved. This information is not intended as a substitute for professional medical care. Always follow your healthcare professional's instructions.         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  ZOLEDRONIC ACID (ZOE le dron ik AS id) lowers the amount of calcium loss from bone. It is used to treat Paget's disease and osteoporosis in women.  This medicine may be used for other purposes; ask your health care provider or pharmacist if you have questions.  What should I tell my health care provider before I take this medicine?  They need to know if you have any of these conditions:   aspirin-sensitive asthma   cancer, especially if you are receiving medicines used to treat cancer   dental disease or wear dentures   infection   kidney disease   low levels of calcium in the blood   past surgery on the parathyroid gland or intestines   receiving corticosteroids like dexamethasone or prednisone   an unusual or allergic reaction to zoledronic acid, other medicines, foods, dyes, or preservatives   pregnant or trying to get pregnant   breast-feeding  How should I use this medicine?  This medicine is for infusion into a vein. It is given by a health care professional in a hospital or clinic setting.  Talk to your pediatrician regarding the use of this medicine in children. This medicine is not approved for use in children.  Overdosage: If you think you have taken too much of this medicine contact a poison control center or emergency room  at once.  NOTE: This medicine is only for you. Do not share this medicine with others.  What if I miss a dose?  It is important not to miss your dose. Call your doctor or health care professional if you are unable to keep an appointment.  What may interact with this medicine?   certain antibiotics given by injection   NSAIDs, medicines for pain and inflammation, like ibuprofen or naproxen   some diuretics like bumetanide, furosemide   teriparatide  This list may not describe all possible interactions. Give your health care provider a list of all the medicines, herbs, non-prescription drugs, or dietary supplements you use. Also tell them if you smoke, drink alcohol, or use illegal drugs. Some items may interact with your medicine.  What  should I watch for while using this medicine?  Visit your doctor or health care professional for regular checkups. It may be some time before you see the benefit from this medicine. Do not stop taking your medicine unless your doctor tells you to. Your doctor may order blood tests or other tests to see how you are doing.  Women should inform their doctor if they wish to become pregnant or think they might be pregnant. There is a potential for serious side effects to an unborn child. Talk to your health care professional or pharmacist for more information.  You should make sure that you get enough calcium and vitamin D while you are taking this medicine. Discuss the foods you eat and the vitamins you take with your health care professional.  Some people who take this medicine have severe bone, joint, and/or muscle pain. This medicine may also increase your risk for jaw problems or a broken thigh bone. Tell your doctor right away if you have severe pain in your jaw, bones, joints, or muscles. Tell your doctor if you have any pain that does not go away or that gets worse.  Tell your dentist and dental surgeon that you are taking this medicine. You should not have major dental surgery  while on this medicine. See your dentist to have a dental exam and fix any dental problems before starting this medicine. Take good care of your teeth while on this medicine. Make sure you see your dentist for regular follow-up appointments.  What side effects may I notice from receiving this medicine?  Side effects that you should report to your doctor or health care professional as soon as possible:   allergic reactions like skin rash, itching or hives, swelling of the face, lips, or tongue   anxiety, confusion, or depression   breathing problems   changes in vision   eye pain   feeling faint or lightheaded, falls   jaw pain, especially after dental work   mouth sores   muscle cramps, stiffness, or weakness   redness, blistering, peeling or loosening of the skin, including inside the mouth   trouble passing urine or change in the amount of urine  Side effects that usually do not require medical attention (report to your doctor or health care professional if they continue or are bothersome):   bone, joint, or muscle pain   constipation   diarrhea   fever   hair loss   irritation at site where injected   loss of appetite   nausea, vomiting   stomach upset   trouble sleeping   trouble swallowing   weak or tired  This list may not describe all possible side effects. Call your doctor for medical advice about side effects. You may report side effects to FDA at 1-800-FDA-1088.  Where should I keep my medicine?  This drug is given in a hospital or clinic and will not be stored at home.  NOTE:This sheet is a summary. It may not cover all possible information. If you have questions about this medicine, talk to your doctor, pharmacist, or health care provider. Copyright 2018 [employer]

## 2020-09-06 NOTE — Nurses Notes (Signed)
Corrected Ca 10.1  Cr Cl 53.6 zometa dose adjusted

## 2020-09-06 NOTE — Nurses Notes (Signed)
Escorted to lobby via wc. Patient discharged home with family.  AVS reviewed with patient/care giver.  A written copy of the AVS and discharge instructions was given to the patient/care giver.  Questions sufficiently answered as needed.  Patient/care giver encouraged to follow up with PCP as indicated.  In the event of an emergency, patient/care giver instructed to call 911 or go to the nearest emergency room.

## 2020-09-11 ENCOUNTER — Other Ambulatory Visit: Payer: Self-pay | Admitting: Gastroenterology

## 2020-09-11 ENCOUNTER — Other Ambulatory Visit: Payer: Self-pay | Admitting: Nurse Practitioner

## 2020-09-11 ENCOUNTER — Ambulatory Visit: Payer: 59 | Attending: Hematology & Oncology | Admitting: Hematology & Oncology

## 2020-09-11 ENCOUNTER — Other Ambulatory Visit: Payer: Self-pay

## 2020-09-11 ENCOUNTER — Encounter (HOSPITAL_COMMUNITY): Payer: Self-pay | Admitting: Hematology & Oncology

## 2020-09-11 VITALS — BP 133/74 | HR 116 | Temp 99.5°F | Resp 24 | Ht 60.98 in | Wt 85.6 lb

## 2020-09-11 DIAGNOSIS — C7951 Secondary malignant neoplasm of bone: Secondary | ICD-10-CM | POA: Insufficient documentation

## 2020-09-11 DIAGNOSIS — Z79899 Other long term (current) drug therapy: Secondary | ICD-10-CM | POA: Insufficient documentation

## 2020-09-11 DIAGNOSIS — T451X5A Adverse effect of antineoplastic and immunosuppressive drugs, initial encounter: Secondary | ICD-10-CM

## 2020-09-11 DIAGNOSIS — D6481 Anemia due to antineoplastic chemotherapy: Secondary | ICD-10-CM

## 2020-09-11 DIAGNOSIS — G893 Neoplasm related pain (acute) (chronic): Secondary | ICD-10-CM | POA: Insufficient documentation

## 2020-09-11 DIAGNOSIS — Z923 Personal history of irradiation: Secondary | ICD-10-CM | POA: Insufficient documentation

## 2020-09-11 DIAGNOSIS — C3491 Malignant neoplasm of unspecified part of right bronchus or lung: Secondary | ICD-10-CM | POA: Insufficient documentation

## 2020-09-11 DIAGNOSIS — Z87891 Personal history of nicotine dependence: Secondary | ICD-10-CM | POA: Insufficient documentation

## 2020-09-11 DIAGNOSIS — R5381 Other malaise: Secondary | ICD-10-CM

## 2020-09-11 DIAGNOSIS — B182 Chronic viral hepatitis C: Secondary | ICD-10-CM

## 2020-09-11 NOTE — Progress Notes (Signed)
Return Patient Progress Note    Date: 09/11/2020    Name: Gina Barrett  MRN: [id number]  DOB: 30-May-1956   Referring Physician: Self, Referral  Primary Care Provider: Lanier Ensign, FNP-C    Reason for visit/consultation Lung Cancer (Non-small Cell Lung Cancer, Right)      Interval History:   65 y/o woman with metastatic recurrence of NSCLC who is s/p 2nd cycle of dose reduced Carboplatin and Pemetrexed on 08/28/2020.  Per family, she has not been eating much, is increasingly less mobile and "sleeps a lot".  Patient endorses the same and while she works with PT, she does not do much on her own.    ROS:     Review of Systems   Constitutional: Positive for appetite change (1 ensure daily, pt states eating has been good ) and unexpected weight change (Pt losing 08/28/2020-89.3, today 09/11/2020 85.6lb).   Respiratory: Positive for shortness of breath.    Gastrointestinal: Positive for diarrhea (Sunday 09/09/2020) and nausea (No vomitting).   Musculoskeletal: Positive for back pain (wheel chair ) and gait problem (wheel chair ).   Skin: Positive for wound (Buttock, Home health is helping with this ).   Neurological: Positive for extremity weakness (Left Leg) and gait problem (wheel chair ).   Hematological: Bruises/bleeds easily.      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  -PET-CT scan on 09/06/2016 showed 10 mm right upper lobe FDG avid lesion that is slightly larger than 2017 CT and concerning for malignancy.  A separate 3 cm long area of tracer uptake in the left lower lobe was  noted.  -CT-guided biopsy a [facility] on 10/02/2016 was positive for adenocarcinoma that is well to moderately differentiated.  -Mediastinoscopy 11/13/2016 positive for left mid paratrachael    Stage: IV, initially diagnosed as IIIB     Molecular/special studies:  ROS-1, ALK-1, EGFR, PDL1 all negative    Treatment & Surveillance:   1. S/p chemoradiation with carboplatin + paclitaxel + RT 66.6 Gy completed 02/10/19/18    2. Serial imaging since completion of therapy has been negative for recurrence.  Most recent imaging in on 12/03/2018 was also negative but showed a new T6 compression abnormality.     3. Chest CT with contrast from 09/23/2019 which in summary showed increasing pleural based density in the LLL which may be infectious or inflammatory.  There is increased 1.7cm LN in left hilum ans stable compression abnormality of T6.    4. Biopsy of left LL pleural bases mass on 11/02/2019 showed benign fibrovascular tissue with focal chronic inflammation.  no lung parechyma or malgnancy identified.    5. Patient developed progressive musculoskeletal pain around October /November of 2021 evaluation with multiple imaging studies highly suggest metastatic disease.  Evaluation for plasma cell was noncontributory.  Patient was referred to orthopedic surgery (Dr. Hervey Ard, by family request and known to patient)  for surgical recommendation especially regarding weight-bearing.    6. Biopsy of paraspinal soft tissue mass on 07/04/2020 confirmed metastatic carcinoma consistent with lung primary. CARIS NGS negative    7.Radiation Oncology for radiation to the acetabulum as well as the paraspinal mass started 07/19/2020 x 10 , ending 07/31/2020  8. Mediport placed  07/19/2020    9.Begin Zoledronic acid.  Has dentures and no dental eval needed    10. Brain MRI planne2/12/2020     11.  Begin palliative carboplatin & pemetrexed on 08/03/2020        ________________________________________________________________________________________________________________________________________________     Objective   Objective:   BP 133/74 (Site: Left, Patient Position: Sitting)   Pulse (!) 116 Comment: irrgular  Temp 37.5 C (99.5 F) (Thermal Scan)   Resp (!) 24   Ht 1.549 m (5' 0.98") Comment: *  Wt (!) 38.8 kg (85 lb 9.6 oz)   SpO2 100% Comment: ra  BMI 16.18 kg/m       ECOG Status: 3 - Capable of only limited selfcare, confined to bed or chair more than 50% of waking hour.  Physical Exam     Past Medical History:   Diagnosis Date   . Abnormal Pap smear     ASCUS, ASCUS cannot rule out HGSIL   . Cancer (CMS HCC)     non small cell lung cancer (R)-s/p Chemo/Radiation completed 8/18   . Colon polyp    . Dysphagia    . Hypothyroid    . Non-small cell lung cancer, right (CMS [place])     s/p Chemo completed 9/18   . Raynaud's disease    . STD (sexually transmitted disease)     HSV   . Unspecified disorder of thyroid     Hypothyroidism         Current Outpatient Medications   Medication Sig   . Artifi. Tear, Hypromellose,-PF (RETAINE HPMC, PF,) 0.3 % Ophthalmic Drops Administer into affected eye(s) Once per day as needed   . carboxymethyl/glycerin/poly80 (REFRESH OPTIVE ADVANCED OPHT) Apply to eye Once per day as needed   . carboxymethylcellulose Sodium (THERATEARS) 0.25 % Ophthalmic Drops 1-2 Drops Once per day as needed   . folic acid (FOLVITE) 1 mg Oral Tablet Take 1 Tablet (1 mg total) by mouth Once a day   . levothyroxine (SYNTHROID) 88 mcg Oral Tablet TAKE ONE TABLET BY MOUTH EVERY DAY   . loratadine (CLARITIN) 10 mg Oral Tablet take 10 mg by mouth Once a day.   Marland Kitchen MEDIHONEY, HONEY, 80 % Gel Twice daily to affected area(s)   . megestroL (MEGACE) 40 mg Oral Tablet Take 40 mg by mouth   . MULTIVITAMIN ORAL Take by mouth Once a day Centrum Silver Women's 50+   . nebivoloL (BYSTOLIC) 5 mg Oral Tablet TAKE ONE TABLET BY MOUTH EVERY DAY (Patient not taking:  Reported on 08/28/2020)   . Nifedipine (ADALAT CC) 30 mg Oral Tablet Sustained Release take 30 mg by mouth Once a day.   . OMEGA-3 FATTY ACIDS (FISH OIL ORAL) take  by mouth Once a day. (Patient not taking: Reported on 08/28/2020)   . ondansetron (ZOFRAN ODT) 8 mg Oral Tablet, Rapid Dissolve Place 1 Tablet (8 mg total) under the tongue Every 8 hours as needed for Nausea/Vomiting Indications: nausea and vomiting caused by cancer drugs   . polyethylene glycol (MIRALAX) 17 gram Oral Powder in Packet Take 1 Packet (17 g total) by mouth Once a day   . prochlorperazine (COMPAZINE) 10 mg Oral Tablet Take 1 Tablet (10 mg total) by mouth Four times a day as needed for Nausea/Vomiting   . sennosides-docusate sodium (SENOKOT-S) 8.6-50 mg Oral Tablet Take 1 Tablet by mouth Every evening   . tiotropium-olodateroL (STIOLTO RESPIMAT) 2.5-2.5 mcg/actuation Inhalation Mist Take 2 INHALATION by inhalation Once a  day   . UNKNOWN MEDICATION (UNKNOWN MEDICATION) Administer into affected eye(s) [name] 128 Eye Drops OTC 4x daily  [name] 128 Eye Gel OTC Nightly     Allergies as of 09/11/2020 - Reviewed 09/11/2020   Allergen Reaction Noted   . Penicillins  08/15/2010       Social History  Occupation:   Social History     Occupational History     Employer: [employer] [place] [id number]   reports that she quit smoking about 22 years ago. Her smoking use included cigarettes. She started smoking about 49 years ago. She quit after 20.00 years of use. She has never used smokeless tobacco. She reports being sexually active and has had partner(s) who are female. She reports using the following method of birth control/protection: None. She reports that she does not drink alcohol and does not use drugs.    Labs:  Labs reviewed and interpreted:  Results for DINAH, LUPA (MRN [id number]) as of 09/23/2020 18:47   Ref. Range 09/06/2020 12:41   WBC Latest Ref Range: 3.7 - 11.0 x10^3/uL 6.7   HGB Latest Ref Range: 11.5 - 16.0 g/dL 8.6  (L)   HCT Latest Ref Range: 34.8 - 46.0 % 26.6 (L)   PLATELET COUNT Latest Ref Range: 150 - 400 x10^3/uL 469 (H)   RBC Latest Ref Range: 3.85 - 5.22 x10^6/uL 3.05 (L)   MCV Latest Ref Range: 78.0 - 100.0 fL 87.2   MCHC Latest Ref Range: 31.0 - 35.5 g/dL 32.3   MCH Latest Ref Range: 26.0 - 32.0 pg 28.2   RDW-CV Latest Ref Range: 11.5 - 15.5 % 17.2 (H)   MPV Latest Ref Range: 8.7 - 12.5 fL 8.8   BANDS Latest Units: % 5   PMN'S Latest Units: % 63   LYMPHOCYTES Latest Units: % 14   EOSINOPHIL Latest Units: % 0   MONOCYTES Latest Units: % 15   BASOPHILS Latest Units: % 0   METAMYELOCYTES Latest Units: % 3   PMN ABS Latest Ref Range: 1.50 - 7.70 x10^3/uL 4.56   LYMPHS ABS Latest Ref Range: 1.00 - 4.80 x10^3/uL 0.94 (L)   EOS ABS Latest Ref Range: <=0.50 x10^3/uL <0.10   MONOS ABS Latest Ref Range: 0.20 - 1.10 x10^3/uL 1.01   BASOS ABS Latest Ref Range: <=0.20 x10^3/uL <0.10   RBC MORPHOLOGY Unknown Normal RBC and PLT Morphology   SODIUM Latest Ref Range: 136 - 145 mmol/L 138   POTASSIUM Latest Ref Range: 3.5 - 5.1 mmol/L 4.2   CHLORIDE Latest Ref Range: 96 - 111 mmol/L 106   CARBON DIOXIDE Latest Ref Range: 23 - 31 mmol/L 23   BUN Latest Ref Range: 8 - 25 mg/dL 11   CREATININE Latest Ref Range: 0.60 - 1.05 mg/dL 0.66   GLUCOSE Latest Ref Range: 65 - 125 mg/dL 115   ANION GAP Latest Ref Range: 4 - 13 mmol/L 9   BUN/CREAT RATIO Latest Ref Range: 6 - 22  17   ESTIMATED GLOMERULAR FILTRATION RATE Latest Ref Range: >=60 mL/min/BSA >90   CALCIUM Latest Ref Range: 8.8 - 10.2 mg/dL 9.1   TOTAL PROTEIN Latest Ref Range: 6.0 - 8.0 g/dL 6.5   ALBUMIN Latest Ref Range: 3.4 - 4.8 g/dL  2.7 (L)   BILIRUBIN, TOTAL Latest Ref Range: 0.3 - 1.3 mg/dL 0.3   AST (SGOT) Latest Ref Range: 8 - 45 U/L 24   ALT (SGPT) Latest Ref Range: 8 - 22 U/L 13  ALKALINE PHOSPHATASE Latest Ref Range: 50 - 130 U/L 109       Radiology: All recent pertinent radiologic images and reports reviewed          Assessment/Plan:     1. Non-small cell lung cancer,  right (CMS [place])  65 y/o woman with metastatic NSCLC and worsening poor performance status following 2 cycles of palliative chemo.  Unfortunately, I informed her that we will be unable to continue her treatment if her PS and nutrition does not improve.  Will reassess for continuation vs. hospice in 2 weeks.  I stood her up and ambulated her a very short distance and she was mostly shuffling and unable to stand on her own.      2. Bone metastasis (CMS HCC)  - Pain controlled and she is s/p palliative RT  -started zoledronic acid on 09/06/2020    3. Physical deconditioning  -per #1 above    4. Cancer associated pain  Controlled      5. Antineoplastic chemotherapy induced anemia  -No indication for PRBC but may need in the future.    Return in about 2 weeks (around 09/25/2020).    Mertie Moores, MD        CC:  PCP General:  Lanier Ensign, FNP-C  [facility] [address]  [place]  [id number]      Portions of this note may be dictated using voice recognition software or a dictation service. Variances in spelling and vocabulary are possible and unintentional. Not all errors are caught/corrected. Please notify the [facility] if any discrepancies are noted or if the meaning of any statement is not clear.

## 2020-09-12 ENCOUNTER — Telehealth (INDEPENDENT_AMBULATORY_CARE_PROVIDER_SITE_OTHER): Payer: Commercial Managed Care - POS | Admitting: Family

## 2020-09-12 ENCOUNTER — Encounter (INDEPENDENT_AMBULATORY_CARE_PROVIDER_SITE_OTHER): Payer: Self-pay | Admitting: Family

## 2020-09-12 VITALS — BP 124/72 | HR 118 | Temp 98.4°F | Ht 61.0 in | Wt 85.0 lb

## 2020-09-12 DIAGNOSIS — K5903 Drug induced constipation: Secondary | ICD-10-CM

## 2020-09-12 DIAGNOSIS — H6191 Disorder of right external ear, unspecified: Secondary | ICD-10-CM

## 2020-09-12 DIAGNOSIS — R630 Anorexia: Secondary | ICD-10-CM

## 2020-09-12 DIAGNOSIS — L209 Atopic dermatitis, unspecified: Secondary | ICD-10-CM

## 2020-09-12 DIAGNOSIS — L89311 Pressure ulcer of right buttock, stage 1: Secondary | ICD-10-CM

## 2020-09-12 DIAGNOSIS — Z993 Dependence on wheelchair: Secondary | ICD-10-CM

## 2020-09-12 DIAGNOSIS — R531 Weakness: Secondary | ICD-10-CM

## 2020-09-12 DIAGNOSIS — C7951 Secondary malignant neoplasm of bone: Secondary | ICD-10-CM

## 2020-09-12 DIAGNOSIS — C3491 Malignant neoplasm of unspecified part of right bronchus or lung: Secondary | ICD-10-CM

## 2020-09-12 MED ORDER — CLOTRIMAZOLE 1 % EX CREA
TOPICAL_CREAM | Freq: Two times a day (BID) | CUTANEOUS | 3 refills | Status: DC
Start: ? — End: 2020-09-12

## 2020-09-12 MED ORDER — NEBIVOLOL HCL 5 MG PO TABS
5.0000 mg | ORAL_TABLET | Freq: Every day | ORAL | 3 refills | Status: DC
Start: ? — End: 2020-09-12

## 2020-09-12 NOTE — Progress Notes (Signed)
SW Note:  Called patient's daughter in law, Anderson Malta, regarding disability questions.  No answer; left voicemail and waiting for return call.  Cheree Ditto, SOCIAL WORKER

## 2020-09-12 NOTE — Progress Notes (Signed)
Subjective:    Patient ID: Melody Hendricks is a 65 y.o. female.    Consultation for  Non-small cell lung cancer, right  (primary encounter diagnosis)  Bone metastasis  Loss of appetite  Pressure injury of right buttock, stage 1  Atopic dermatitis, unspecified type  Wheelchair bound  Earlobe lesion, right  Drug-induced constipation  Generalized weakness  Tachycardia     Verbal consent has been obtained from the patient to conduct a video and telephone: yes  Telemedicine Documentation Requirements  Originating site (Patient location): home  Distant site (Provider location): [facility] [place]  Provider and Title: Marya Landry, FNP  Consent obtained: YES/NO: Yes  Language, if applicable and if translator was required: [place]    Tele conference lasted 15 minutes     Patient reports that she saw Dr. Charlette Caffey, her oncologist yesterday.  She states he has concern about her low weight, weakness, and decreased appetite.  She states she is going to see the specialist on May 13 to decide if she can proceed with chemotherapy.  Patient was encouraged to start weightbearing to help improve her strength.  She was also prescribed to do 3 ensures a day.  Patient was started on Megace by me which she states has helped improve her appetite a little.    Patient still has an open area on her right buttock at stage I.  It is without drainage or erythema.  Patient's daughter-in-law is helping to provide wound care with Medihoney dressings with good results.    It was noted on patient's last exam she had some erythematous around her buttocks, I had prescribed steroid which did help but did not take it completely away.  The home health aides recommended that patient add antifungal, Lotrimin to the area as well as the steroid which resolves her skin issue.    Patient is still using the wheelchair.  She states that she is working to get back to ambulating with a walker.  Physical therapy is due to come into  the home tomorrow at 10 for evaluation.    The lesion on her right earlobe has improved with steroid cream.    Unfortunately patient ended up in the [facility] emergency room on 09/02/2020 for a fecal impaction.  Patient reports that she started with abdominal pain and had not had a good bowel movement in 1 week.  She took 1 dose of MiraLAX without good results.  Patient had an enema with good results.  She is currently taking 2 stool softeners a day and MiraLAX.  She states that she had a nonformed stool yesterday.    She does state that she continues to feel weak but is optimistic about getting stronger and starting to ambulate with a walker.  Per patient's daughter-in-law per the oncologist stated that getting stronger outweighed the risk of her developing a fracture.    2 weeks ago patient was advised to stop her by systolic due to her heart rate remaining normal.  Patient has been monitoring her heart rate and has been running anywhere between 102 to118.  Patient's daughter-in-law whom is present with patient during this telehealth visit felt patient's pulse and states that it is very irregular.  Patient denies any chest pain or heart palpitations.        The following portions of the patient's history were reviewed and updated as appropriate: allergies, current medications, past family history, past medical history, past social history, past surgical history and problem  list.    Review of Systems   Constitutional: Positive for fatigue.   Cardiovascular:        Tachycardia   Gastrointestinal: Positive for constipation and diarrhea.   Skin:        Small ulceration on right buttock   Neurological: Positive for weakness.   All other systems reviewed and are negative.          Objective:    Physical Exam  Vitals reviewed.   Constitutional:       Comments: Frail, older than stated age   HENT:      Head: Normocephalic.      Nose: Nose normal.      Mouth/Throat:      Mouth: Mucous membranes are moist.       Comments: Not wearing dentures  Eyes:      Extraocular Movements: Extraocular movements intact.      Conjunctiva/sclera: Conjunctivae normal.      Pupils: Pupils are equal, round, and reactive to light.   Pulmonary:      Effort: Pulmonary effort is normal. No respiratory distress.   Neurological:      Mental Status: She is alert and oriented to person, place, and time.   Psychiatric:         Mood and Affect: Mood normal.         Behavior: Behavior normal.         Thought Content: Thought content normal.         Judgment: Judgment normal.             Assessment:       1. Non-small cell lung cancer, right    2. Bone metastasis    3. Loss of appetite    4. Pressure injury of right buttock, stage 1    5. Atopic dermatitis, unspecified type    6. Wheelchair bound    7. Earlobe lesion, right    8. Drug-induced constipation    9. Generalized weakness          Plan:       1-2 patient to keep her already scheduled appointment with Dr. Lorrin Jackson, oncology May 13.  Continue chemotherapy if approved by oncology  3.  Encourage patient to increase her Ensure to 3 a day.  Also discussed could consider adding protein powder to baked goods.  Continue Megace  4.  Improving.  Continue meta honey applications daily with her regular position changes  5.  Resolved with steroid and antifungal cream  6.  Encourage patient with assistance to try to use walker to ambulate.  Physical therapy due to evaluate patient tomorrow  7.  Improved with steroid cream  8.  Continue MiraLAX and stool softeners.  Will discuss with physical therapy tomorrow for pelvic strengthening exercises  9.  Encourage better nutrition to help with her generalized weakness  10.  Restart Bystolic 5 mg for tachycardia    Follow-up in 4 weeks via telehealth and as needed    Orders Placed This Encounter    nebivolol (Bystolic) 5 MG tablet    clotrimazole (LOTRIMIN) 1 % cream

## 2020-09-17 ENCOUNTER — Other Ambulatory Visit (INDEPENDENT_AMBULATORY_CARE_PROVIDER_SITE_OTHER): Payer: Self-pay | Admitting: Family

## 2020-09-17 DIAGNOSIS — N8189 Other female genital prolapse: Secondary | ICD-10-CM

## 2020-09-20 ENCOUNTER — Inpatient Hospital Stay (HOSPITAL_COMMUNITY): Payer: 59

## 2020-09-24 ENCOUNTER — Telehealth (INDEPENDENT_AMBULATORY_CARE_PROVIDER_SITE_OTHER): Payer: Self-pay

## 2020-09-24 ENCOUNTER — Other Ambulatory Visit (INDEPENDENT_AMBULATORY_CARE_PROVIDER_SITE_OTHER): Payer: Self-pay | Admitting: Family

## 2020-09-24 NOTE — Telephone Encounter (Signed)
LM with [facility] with [facility] asking her to verify that she received orders on 09/20/2020 for Pelvic Floor Strengthing Therapy. I informed them that they were attached to signed forms.

## 2020-09-25 ENCOUNTER — Emergency Department
Admission: EM | Admit: 2020-09-25 | Discharge: 2020-09-25 | Disposition: A | Discharge status: Home / Self Care | Payer: 59 | Attending: Emergency Medicine | Admitting: Emergency Medicine

## 2020-09-25 ENCOUNTER — Encounter (HOSPITAL_COMMUNITY): Payer: Self-pay

## 2020-09-25 ENCOUNTER — Other Ambulatory Visit: Payer: Self-pay

## 2020-09-25 DIAGNOSIS — C3490 Malignant neoplasm of unspecified part of unspecified bronchus or lung: Secondary | ICD-10-CM | POA: Insufficient documentation

## 2020-09-25 DIAGNOSIS — K5641 Fecal impaction: Secondary | ICD-10-CM | POA: Insufficient documentation

## 2020-09-25 DIAGNOSIS — Z87891 Personal history of nicotine dependence: Secondary | ICD-10-CM | POA: Insufficient documentation

## 2020-09-25 DIAGNOSIS — C7951 Secondary malignant neoplasm of bone: Secondary | ICD-10-CM | POA: Insufficient documentation

## 2020-09-25 NOTE — ED Nurses Note (Signed)
Went over discharge instructions with patient and husband, both verbalized understanding.

## 2020-09-25 NOTE — ED Provider Notes (Signed)
Victorino Sparrow, MD  [facility]  Emergency Department Visit Note    Date:  09/25/2020  Primary care provider:  Lanier Ensign, FNP-C  Means of arrival:  private car  History obtained from: patient  History limited by: none    Chief Complaint:  Constipation    HISTORY OF PRESENT ILLNESS     Gina Barrett, date of birth 27-Sep-1955, is a 65 y.o. female who presents to the Emergency Department complaining of constipation for the last 3 days. She states she has the urge and feeling to defecate but she is unable to do so. 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 She denies abdominal pain, abdominal distention, obstipation, bloating, belching, nausea, vomiting, rectal pain, rectal bleeding, measured fevers, chest pain, SOB, and measured fevers.    REVIEW OF SYSTEMS     The pertinent positive and negative symptoms are as per HPI. All other systems reviewed and are negative.     PATIENT HISTORY     Past Medical History:  Past Medical History:   Diagnosis Date    Abnormal Pap smear     ASCUS, ASCUS cannot rule out HGSIL    Cancer (CMS HCC)     non small cell lung cancer (R)-s/p Chemo/Radiation completed 8/18    Colon polyp     Dysphagia     Hypothyroid     Non-small cell lung cancer, right (CMS HCC)     s/p Chemo completed 9/18    Raynaud's disease     STD (sexually transmitted disease)     HSV    Unspecified disorder of thyroid     Hypothyroidism       Past Surgical History:  Past Surgical History:   Procedure Laterality Date    Hx cataract removal Bilateral 2011    Hx colonoscopy  2009    Hx elbow surgery      Hx foot surgery      Hx tubal ligation  1978    Lung biopsy Right 10/02/2016    Mediastinoscopy  11/13/2016    Pb forearm/wrist surgery unlisted  1999    Portacath placement   12/12/2016       Family History:  Family Medical History:     Problem Relation (Age of Onset)    Breast Cancer Mother    Diabetes Paternal Grandfather, Brother    No Known Problems Father, Sister, Maternal Grandmother, Maternal Grandfather, Paternal 26, Daughter, Son, Maternal Aunt, Maternal Uncle, Paternal 52, Paternal Uncle, Other          Social History:  Social History     Tobacco Use    Smoking status: Former Smoker     Years: 20.00     Types: Cigarettes     Start date: 12/09/1970     Quit date: 09/08/1998     Years since quitting: 22.0    Smokeless tobacco: Never Used   Substance Use Topics    Alcohol use: No    Drug use: No     Social History     Substance and Sexual Activity   Drug Use No       Medications:  Current Outpatient Medications   Medication Sig    Artifi. Tear, Hypromellose,-PF (RETAINE HPMC, PF,) 0.3 % Ophthalmic Drops Administer into affected eye(s)  Once per day as needed    carboxymethyl/glycerin/poly80 ([place]) Apply to eye Once per day as needed    carboxymethylcellulose Sodium (THERATEARS) 0.25 % Ophthalmic Drops 1-2 Drops Once per day as needed    folic acid (FOLVITE) 1 mg Oral Tablet Take 1 Tablet (1 mg total) by mouth Once a day    levothyroxine (SYNTHROID) 88 mcg Oral Tablet TAKE ONE TABLET BY MOUTH EVERY DAY    loratadine (CLARITIN) 10 mg Oral Tablet take 10 mg by mouth Once a day.    MEDIHONEY, HONEY, 80 % Gel Twice daily to affected area(s)    megestroL (MEGACE) 40 mg Oral Tablet Take 40 mg by mouth    MULTIVITAMIN ORAL Take by mouth Once a day Centrum Silver Women's 50+    nebivoloL (BYSTOLIC) 5 mg Oral Tablet TAKE ONE TABLET BY MOUTH EVERY DAY (Patient not taking: Reported on 08/28/2020)    Nifedipine (ADALAT CC) 30 mg Oral Tablet Sustained Release take 30 mg by mouth Once a day.    OMEGA-3 FATTY ACIDS (FISH OIL ORAL) take  by mouth Once a day. (Patient not taking: Reported on 08/28/2020)    ondansetron (ZOFRAN ODT) 8 mg Oral Tablet,  Rapid Dissolve Place 1 Tablet (8 mg total) under the tongue Every 8 hours as needed for Nausea/Vomiting Indications: nausea and vomiting caused by cancer drugs    polyethylene glycol (MIRALAX) 17 gram Oral Powder in Packet Take 1 Packet (17 g total) by mouth Once a day    prochlorperazine (COMPAZINE) 10 mg Oral Tablet Take 1 Tablet (10 mg total) by mouth Four times a day as needed for Nausea/Vomiting    sennosides-docusate sodium (SENOKOT-S) 8.6-50 mg Oral Tablet Take 1 Tablet by mouth Every evening    tiotropium-olodateroL (STIOLTO RESPIMAT) 2.5-2.5 mcg/actuation Inhalation Mist Take 2 INHALATION by inhalation Once a day    UNKNOWN MEDICATION (UNKNOWN MEDICATION) Administer into affected eye(s) [name] 128 Eye Drops OTC 4x daily  [name] 128 Eye Gel OTC Nightly       Allergies:  Allergies   Allergen Reactions    Penicillins        PHYSICAL EXAM     Vitals:  Filed Vitals:    09/25/20 1949   BP: (!) 113/56   Pulse: (!) 101   Resp: 16   Temp: 37.2 C (98.9 F)   SpO2: 100%       Pulse ox  100% on None (Room Air) interpreted by me as: Normal    Constitutional: Elderly female in no acute distress.   Head: Normocephalic and atraumatic.   ENT: Moist mucous membranes. No erythema or exudates in the oropharynx.  Eyes: EOM are normal. Pupils are equal, round, and reactive to light. No scleral icterus.   Neck: Neck supple. No meningismus.  Cardiovascular: Normal rate and regular rhythm. No murmur heard. 2+ distal pulses all 4 extremities.  Pulmonary/Chest: Effort normal and breath sounds normal.   Abdominal: Soft. No distension. There is no tenderness.   Rectal: Copious amounts of soft stool released during disimpaction without any gross blood or significant rectal pain.   Back: There is no CVA tenderness.   Musculoskeletal: Normal range of motion. No edema and no tenderness. No clubbing or cyanosis.  Neurological: Patient is alert and oriented to person, place, and time. Strength and sensation normal in all extremities.  Normal facial symmetry and speech.   Skin: Skin is warm and dry. No rash noted.      DIAGNOSTIC STUDIES  Labs:  No results found for any visits on 09/25/20.  Labs reviewed and interpreted by me.    ED PROGRESS NOTE / [place] records reviewed by me:  I have reviewed the nurse's notes. I have reviewed the patient's problem list and pertinent past medical records.    No orders of the defined types were placed in this encounter.         1947: Initial evaluation is complete at this time. I discussed with the patient that I would examine the patient for fecal impaction to further evaluate. Patient is agreeable with the treatment plan at this time.    2030: Fecal disimpaction performed (with female chaperone present), revealing copious amounts of small stool without gross blood or significant rectal pain. Patient is to follow up with Lanier Ensign, FNP-C as needed. I discussed the diagnosis, disposition, and follow-up plan. The patient understood and is in accordance with the treatment plan at this time. Patient is to return here to the Emergency Department if new or worsening symptoms appear. All of their questions have been answered to their satisfaction. The patient is in stable condition at the time of discharge.     PRE-DISPOSITION VITALS      Pre-Disposition Vitals:  Filed Vitals:    09/25/20 2015 09/25/20 2030   BP: 125/61 114/62   Pulse: 98 97   Resp: 16 16   Temp:     SpO2: 99% 99%     CLINICAL IMPRESSION     Encounter Diagnosis   Name Primary?    Fecal impaction in rectum (CMS [place]) Yes     DISPOSITION/PLAN     Discharged      Follow-Up:  Lanier Ensign, [facility]  [facility]  [place] [address]  [phone]    Call   As needed    Condition at Disposition: [place] Devar Leonides Cave, SCRIBE scribed for Victorino Sparrow, MD on 09/25/2020 at 7:47 PM.     Documentation assistance provided for Victorino Sparrow, MD  by Concha Se, SCRIBE. Information recorded by the scribe was done at my direction and has been reviewed and validated by me Trixie Dredge, Freddie Breech, MD.

## 2020-09-25 NOTE — ED Triage Notes (Signed)
Pt reports hx of constipation, last BM was on Sunday 09/23/20 "very little". Pt is on pain medications due to cancer, reports taking Miralax and Senna everyday; has needed disimpaction in the past.

## 2020-09-26 ENCOUNTER — Ambulatory Visit
Admission: RE | Admit: 2020-09-26 | Discharge: 2020-09-26 | Disposition: A | Discharge status: Home / Self Care | Payer: 59 | Source: Ambulatory Visit | Attending: Hematology & Oncology | Admitting: Hematology & Oncology

## 2020-09-26 ENCOUNTER — Encounter (HOSPITAL_COMMUNITY): Payer: Self-pay | Admitting: Hematology & Oncology

## 2020-09-26 ENCOUNTER — Ambulatory Visit (HOSPITAL_BASED_OUTPATIENT_CLINIC_OR_DEPARTMENT_OTHER): Payer: 59 | Admitting: Hematology & Oncology

## 2020-09-26 VITALS — BP 112/64 | HR 92 | Temp 99.3°F | Resp 24 | Ht 60.98 in | Wt 85.2 lb

## 2020-09-26 DIAGNOSIS — C3491 Malignant neoplasm of unspecified part of right bronchus or lung: Secondary | ICD-10-CM

## 2020-09-26 DIAGNOSIS — R630 Anorexia: Secondary | ICD-10-CM

## 2020-09-26 DIAGNOSIS — C801 Malignant (primary) neoplasm, unspecified: Secondary | ICD-10-CM

## 2020-09-26 DIAGNOSIS — R627 Adult failure to thrive: Secondary | ICD-10-CM

## 2020-09-26 DIAGNOSIS — R269 Unspecified abnormalities of gait and mobility: Secondary | ICD-10-CM

## 2020-09-26 DIAGNOSIS — C7951 Secondary malignant neoplasm of bone: Secondary | ICD-10-CM

## 2020-09-26 LAB — CBC WITH DIFF
BASOPHIL #: <0.1 10*3/uL (ref <=0.20)
BASOPHIL %: 0 %
EOSINOPHIL #: <0.1 10*3/uL (ref <=0.50)
EOSINOPHIL %: 0 %
HCT: 25.2 % — ABNORMAL LOW (ref 34.8–46.0)
HGB: 7.7 g/dL — ABNORMAL LOW (ref 11.5–16.0)
IMMATURE GRANULOCYTE #: <0.1 10*3/uL (ref <0.10)
IMMATURE GRANULOCYTE %: 2 % — ABNORMAL HIGH (ref 0–1)
LYMPHOCYTE #: 0.43 10*3/uL — ABNORMAL LOW (ref 1.00–4.80)
LYMPHOCYTE %: 18 %
MCH: 28.7 pg (ref 26.0–32.0)
MCHC: 30.6 g/dL — ABNORMAL LOW (ref 31.0–35.5)
MCV: 94 fL (ref 78.0–100.0)
MONOCYTE #: 0.77 10*3/uL (ref 0.20–1.10)
MONOCYTE %: 33 %
MPV: 10.3 fL (ref 8.7–12.5)
NEUTROPHIL #: 1.09 10*3/uL — ABNORMAL LOW (ref 1.50–7.70)
NEUTROPHIL %: 47 %
PLATELETS: 368 10*3/uL (ref 150–400)
RBC: 2.68 10*6/uL — ABNORMAL LOW (ref 3.85–5.22)
RDW-CV: 21.6 % — ABNORMAL HIGH (ref 11.5–15.5)
WBC: 2.4 10*3/uL — ABNORMAL LOW (ref 3.7–11.0)

## 2020-09-26 LAB — COMPREHENSIVE METABOLIC PANEL, NON-FASTING
ALBUMIN: 3.1 g/dL — ABNORMAL LOW (ref 3.4–4.8)
ALKALINE PHOSPHATASE: 86 U/L (ref 55–145)
ALT (SGPT): 15 U/L (ref 8–22)
ANION GAP: 11 mmol/L (ref 4–13)
AST (SGOT): 20 U/L (ref 8–45)
BILIRUBIN TOTAL: 0.2 mg/dL — ABNORMAL LOW (ref 0.3–1.3)
BUN/CREA RATIO: 19 (ref 6–22)
BUN: 12 mg/dL (ref 8–25)
CALCIUM: 8.8 mg/dL (ref 8.8–10.2)
CHLORIDE: 107 mmol/L (ref 96–111)
CO2 TOTAL: 21 mmol/L — ABNORMAL LOW (ref 23–31)
CREATININE: 0.64 mg/dL (ref 0.60–1.05)
ESTIMATED GFR: >90 mL/min/BSA (ref >=60)
GLUCOSE: 87 mg/dL (ref 65–125)
POTASSIUM: 4.3 mmol/L (ref 3.5–5.1)
PROTEIN TOTAL: 6.5 g/dL (ref 6.0–8.0)
SODIUM: 139 mmol/L (ref 136–145)

## 2020-09-26 MED ORDER — MIRTAZAPINE 15 MG TABLET
15.0000 mg | ORAL_TABLET | Freq: Every evening | ORAL | 1 refills | Status: DC
Start: 2020-09-26 — End: 2020-11-20

## 2020-09-26 NOTE — Progress Notes (Signed)
Return Patient Progress Note    Date: 09/26/2020    Name: Gina Barrett  MRN: [id number]  DOB: 10-30-1955   Referring Physician: Self, Referral  Primary Care Provider: Lanier Ensign, FNP-C    Reason for visit/consultation Lung Cancer (Pre- chemo)      Interval History:   Ms. Gina Barrett is a 65 y/o woman with metastatic NSCLC and failure to thrive on current therapy.  She is in clinic with daughter inlaw and husband.  Both reported progressive overall decline in her nutrition, physical activity (worse), hydration but pain controlled.  Her last chemotherapy was on 08/28/2020.  Additional ROS as below    ROS:     Review of Systems   Constitutional: Appetite change: improving.   Gastrointestinal: Positive for constipation, diarrhea and nausea.        Bowel blockage   Musculoskeletal: Positive for gait problem (wheelchair).   Skin: Positive for wound (buttocks tail bone area wound starting to form. new redness as well. ).   Neurological: Positive for extremity weakness (occ. legs) and gait problem (wheelchair).        Tremors   All other systems reviewed and are negative.     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  -PET-CT scan on 09/06/2016 showed 10 mm right upper lobe FDG avid lesion that is slightly larger than 2017 CT and concerning for malignancy.  A separate 3 cm long area of tracer uptake in the left lower lobe was noted.  -CT-guided biopsy a [facility] on 10/02/2016 was positive for adenocarcinoma that is well to moderately differentiated.  -Mediastinoscopy 11/13/2016 positive for left mid  paratrachael    Stage: IV, initially diagnosed as IIIB     Molecular/special studies:  ROS-1, ALK-1, EGFR, PDL1 all negative    Treatment & Surveillance:   1. S/p chemoradiation with carboplatin + paclitaxel + RT 66.6 Gy completed 02/10/19/18    2. Serial imaging since completion of therapy has been negative for recurrence.  Most recent imaging in on 12/03/2018 was also negative but showed a new T6 compression abnormality.     3. Chest CT with contrast from 09/23/2019 which in summary showed increasing pleural based density in the LLL which may be infectious or inflammatory.  There is increased 1.7cm LN in left hilum ans stable compression abnormality of T6.    4. Biopsy of left LL pleural bases mass on 11/02/2019 showed benign fibrovascular tissue with focal chronic inflammation.  no lung parechyma or malgnancy identified.    5. Patient developed progressive musculoskeletal pain around October /November of 2021 evaluation with multiple imaging studies highly suggest metastatic disease.  Evaluation for plasma cell was noncontributory.  Patient was referred to orthopedic surgery (Dr. Hervey Ard, by family request and known to patient)  for surgical recommendation especially regarding weight-bearing.    6. Biopsy of paraspinal soft tissue mass on 07/04/2020 confirmed metastatic carcinoma consistent with lung primary. CARIS NGS negative    7.Radiation Oncology for radiation to the acetabulum as well as the paraspinal mass started 07/19/2020 x 10 , ending 07/31/2020    8. Mediport placed  07/19/2020    9.Begin Zoledronic acid.  Has dentures and no dental eval needed  10. Brain MRI planne2/12/2020     11.  Begin palliative carboplatin & pemetrexed on 08/03/2020       ________________________________________________________________________________________________________________________________________________     Objective   Objective:   BP 112/64   Pulse 92   Temp 37.4 C (99.3 F) (Thermal Scan)   Resp (!) 24   Ht 1.549 m  (5' 0.98") Comment: *  Wt (!) 38.6 kg (85 lb 3.2 oz)   SpO2 100% Comment: ra  BMI 16.11 kg/m       ECOG Status: 4 - Completely disabled.   Cannot carry on any selfcare. Totally confined to bed or chair.  Physical Exam  Constitutional:       General: She is not in acute distress.     Appearance: She is cachectic. She is ill-appearing.   Eyes:      General: No scleral icterus.  Cardiovascular:      Rate and Rhythm: Normal rate.      Heart sounds: No murmur heard.  Pulmonary:      Breath sounds: No stridor. Rales present. No wheezing or rhonchi.   Abdominal:      Palpations: Abdomen is soft.      Tenderness: There is no abdominal tenderness.   Musculoskeletal:      Right lower leg: No edema.   Lymphadenopathy:      Cervical: No cervical adenopathy.   Skin:     Coloration: Skin is not jaundiced.   Neurological:      Motor: Weakness present.      Gait: Gait abnormal.          Past Medical History:   Diagnosis Date   . Abnormal Pap smear     ASCUS, ASCUS cannot rule out HGSIL   . Cancer (CMS HCC)     non small cell lung cancer (R)-s/p Chemo/Radiation completed 8/18   . Colon polyp    . Dysphagia    . Hypothyroid    . Non-small cell lung cancer, right (CMS [place])     s/p Chemo completed 9/18   . Raynaud's disease    . STD (sexually transmitted disease)     HSV   . Unspecified disorder of thyroid     Hypothyroidism         Current Outpatient Medications   Medication Sig   . Artifi. Tear, Hypromellose,-PF (RETAINE HPMC, PF,) 0.3 % Ophthalmic Drops Administer into affected eye(s) Once per day as needed   . carboxymethyl/glycerin/poly80 (REFRESH OPTIVE ADVANCED OPHT) Apply to eye Once per day as needed   . carboxymethylcellulose Sodium (THERATEARS) 0.25 % Ophthalmic Drops 1-2 Drops Once per day as needed   . folic acid (FOLVITE) 1 mg Oral Tablet TAKE ONE TABLET BY MOUTH EVERY DAY   . levothyroxine (SYNTHROID) 88 mcg Oral Tablet TAKE ONE TABLET BY MOUTH EVERY DAY   . loratadine (CLARITIN) 10 mg Oral Tablet take 10 mg by mouth  Once a day.   [place] MEDIHONEY, HONEY, 80 % Gel Twice daily to affected area(s)   . megestroL (MEGACE) 40 mg Oral Tablet Take 40 mg by mouth   . mirtazapine (REMERON) 15 mg Oral Tablet Take 1 Tablet (15 mg total) by mouth Every night   . MULTIVITAMIN ORAL Take by mouth Once a day Centrum Silver Women's 50+   . nebivoloL (BYSTOLIC) 5 mg Oral Tablet    . Nifedipine (ADALAT CC) 30 mg Oral Tablet Sustained Release take 30 mg by mouth Once a day.   . OMEGA-3 FATTY ACIDS (  FISH OIL ORAL) take  by mouth Once a day. (Patient not taking: No sig reported)   . ondansetron (ZOFRAN ODT) 8 mg Oral Tablet, Rapid Dissolve Place 1 Tablet (8 mg total) under the tongue Every 8 hours as needed for Nausea/Vomiting Indications: nausea and vomiting caused by cancer drugs   . oxyCODONE-acetaminophen (PERCOCET) 10-325 mg Oral Tablet Take 1 Tablet by mouth Every 6 hours as needed for Pain for up to 30 days   . polyethylene glycol (MIRALAX) 17 gram Oral Powder in Packet Take 1 Packet (17 g total) by mouth Once a day   . prochlorperazine (COMPAZINE) 10 mg Oral Tablet Take 1 Tablet (10 mg total) by mouth Four times a day as needed for Nausea/Vomiting   . sennosides-docusate sodium (SENOKOT-S) 8.6-50 mg Oral Tablet Take 1 Tablet by mouth Every evening   . tiotropium-olodateroL (STIOLTO RESPIMAT) 2.5-2.5 mcg/actuation Inhalation Mist Take 2 INHALATION by inhalation Once a day (Patient taking differently: Take 2 INHALATION by inhalation Once a day inhaler)   . UNKNOWN MEDICATION (UNKNOWN MEDICATION) Administer into affected eye(s) [name] 128 Eye Drops OTC 4x daily  [name] 128 Eye Gel OTC Nightly     Allergies as of 09/26/2020 - Reviewed 09/26/2020   Allergen Reaction Noted   . Penicillins  08/15/2010       Social History  Occupation:   Social History     Occupational History     Employer: [employer]: [place] [id number]   reports that she quit smoking about 22 years ago. Her smoking use included cigarettes. She started smoking about  49 years ago. She quit after 20.00 years of use. She has never used smokeless tobacco. She reports being sexually active and has had partner(s) who are female. She reports using the following method of birth control/protection: None. She reports that she does not drink alcohol and does not use drugs.    Labs:  Labs reviewed and interpreted:  No results found for this or any previous visit (from the past 96 hour(s)).    Radiology: All recent pertinent radiologic images and reports reviewed          Assessment/Plan:     1. Non-small cell lung cancer, right (CMS [facility])  65 y/o woman with stage IIIB lung cancer diagnosed 2018 who has metastatic recurrene of disease with unfortunate significant ossous complication before being discovered.  Her performance status has continued to decline despite her best intention and compounded by poor nutrition.  After 2 cycles of Carboplatin and Pemetrexed, I do not think she can continue at current performance status of 3-4.  I will give another 2 weeks to improve nutrition and PS (getting PT) and if unsuccessful, we will need to discuss hospice.    2. 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    3. Functional gait disorder  -Continue PT and pain control    4. Failure to thrive in adult  -Per #1 above    5. Anorexia  - Trial of Remeron and adjust dose as needed  - mirtazapine (REMERON) 15 mg Oral Tablet; Take 1 Tablet (15 mg total) by mouth Every night  Dispense: 30 Tablet; Refill: 1      Return in about 3 weeks (around 10/17/2020).    Mertie Moores, MD        CC:  PCP General:  Lanier Ensign, FNP-C  [facility] [place] [address]      Portions of this note may be dictated using voice recognition software or a dictation service. Variances in spelling and vocabulary are possible and unintentional. Not all errors are caught/corrected. Please  notify the [facility] if any discrepancies are noted or if the meaning of any statement is not clear.

## 2020-09-27 ENCOUNTER — Telehealth (INDEPENDENT_AMBULATORY_CARE_PROVIDER_SITE_OTHER): Payer: Self-pay

## 2020-09-27 ENCOUNTER — Inpatient Hospital Stay (HOSPITAL_COMMUNITY): Admission: RE | Admit: 2020-09-27 | Payer: 59 | Source: Ambulatory Visit

## 2020-09-27 NOTE — Telephone Encounter (Signed)
Notified. 

## 2020-09-27 NOTE — Telephone Encounter (Signed)
Please give a verbal that his is okay    Marya Landry, FNP

## 2020-09-27 NOTE — Telephone Encounter (Signed)
Melody Hendricks from [facility], states that she went to the ER due to compaction and wants to have a few enemas on hands incase it happens again.

## 2020-09-28 ENCOUNTER — Ambulatory Visit: Payer: 59

## 2020-09-30 ENCOUNTER — Other Ambulatory Visit (HOSPITAL_COMMUNITY): Payer: Self-pay | Admitting: Hematology & Oncology

## 2020-10-01 ENCOUNTER — Other Ambulatory Visit (HOSPITAL_COMMUNITY): Payer: Self-pay | Admitting: Hematology & Oncology

## 2020-10-02 MED ORDER — OXYCODONE-ACETAMINOPHEN 10 MG-325 MG TABLET
1.0000 | ORAL_TABLET | Freq: Four times a day (QID) | ORAL | 0 refills | Status: DC | PRN
Start: 2020-10-02 — End: 2020-11-28

## 2020-10-03 ENCOUNTER — Encounter (HOSPITAL_COMMUNITY): Payer: Self-pay | Admitting: Radiology

## 2020-10-04 ENCOUNTER — Other Ambulatory Visit: Payer: Medicare Other

## 2020-10-05 ENCOUNTER — Ambulatory Visit
Admission: RE | Admit: 2020-10-05 | Discharge: 2020-10-05 | Disposition: A | Discharge status: Home / Self Care | Payer: Medicare Other | Source: Ambulatory Visit | Attending: Nurse Practitioner | Admitting: Nurse Practitioner

## 2020-10-05 DIAGNOSIS — B182 Chronic viral hepatitis C: Secondary | ICD-10-CM

## 2020-10-10 ENCOUNTER — Encounter (INDEPENDENT_AMBULATORY_CARE_PROVIDER_SITE_OTHER): Payer: Self-pay

## 2020-10-10 ENCOUNTER — Encounter (HOSPITAL_COMMUNITY): Payer: Self-pay

## 2020-10-10 NOTE — Nursing Note (Signed)
FMLA paperwork provided to nurse.  Form Completed and awaiting provider signature.  Patient will be contacted once forms have been reviewed and signed.

## 2020-10-11 ENCOUNTER — Encounter (INDEPENDENT_AMBULATORY_CARE_PROVIDER_SITE_OTHER): Payer: Self-pay | Admitting: Family

## 2020-10-11 ENCOUNTER — Telehealth (INDEPENDENT_AMBULATORY_CARE_PROVIDER_SITE_OTHER): Payer: Commercial Managed Care - POS | Admitting: Family

## 2020-10-11 ENCOUNTER — Other Ambulatory Visit (HOSPITAL_COMMUNITY): Payer: Self-pay

## 2020-10-11 VITALS — Ht 61.0 in | Wt 85.0 lb

## 2020-10-11 DIAGNOSIS — R000 Tachycardia, unspecified: Secondary | ICD-10-CM

## 2020-10-11 DIAGNOSIS — K5903 Drug induced constipation: Secondary | ICD-10-CM

## 2020-10-11 DIAGNOSIS — C7951 Secondary malignant neoplasm of bone: Secondary | ICD-10-CM

## 2020-10-11 DIAGNOSIS — C3491 Malignant neoplasm of unspecified part of right bronchus or lung: Secondary | ICD-10-CM

## 2020-10-11 DIAGNOSIS — H6191 Disorder of right external ear, unspecified: Secondary | ICD-10-CM

## 2020-10-11 DIAGNOSIS — L209 Atopic dermatitis, unspecified: Secondary | ICD-10-CM

## 2020-10-11 DIAGNOSIS — L89311 Pressure ulcer of right buttock, stage 1: Secondary | ICD-10-CM

## 2020-10-11 DIAGNOSIS — R630 Anorexia: Secondary | ICD-10-CM

## 2020-10-11 MED ORDER — MEDIHONEY WOUND/BURN DRESSING EX GEL
1.0000 | Freq: Two times a day (BID) | CUTANEOUS | 2 refills | Status: DC
Start: ? — End: 2020-10-11

## 2020-10-11 NOTE — Progress Notes (Signed)
LVM to schedule 6 week follow up.

## 2020-10-11 NOTE — Progress Notes (Signed)
Subjective:    Patient ID: Melody Hendricks is a 65 y.o. female.    Consultation for  Non-small cell lung cancer, right  (primary encounter diagnosis)  Bone metastasis  Loss of appetite  Pressure injury of right buttock, stage 1  Atopic dermatitis, unspecified type  Earlobe lesion, right  Drug-induced constipation  Generalized weakness  Tachycardia     Verbal consent has been obtained from the patient to conduct a video and telephone: yes  Telemedicine Documentation Requirements  Originating site (Patient location): home  Distant site (Provider location): [facility] [place]  Provider and Title: Marya Landry, FNP  Consent obtained: YES/NO: Yes  Language, if applicable and if translator was required: [place]    Tele conference lasted 15 minutes     Patient reports has an appointment to see next week Dr. Charlette Caffey to discuss restarting treatment.  Her cancer treatment was temporarily put on hold due to patient's low weight.  She states she is eating better and has a better appetite.  She is unsure if she has gained any weight.  Patient still has an open area on her right buttock at stage I.  It is without drainage or erythema.  Patient's daughter-in-law is helping to provide wound care with Medihoney dressings with good results.  Patient is unable to show me the area today, however she is adamant that it is getting better.    Her atopic dermatitis has completely resolved with steroids and antifungal treatment.    Patient patient is currently ambulating with the aid of a cane.  Patient reports that physical therapy is going well and she is now attempting to go up and down stairs.    The lesion on her right earlobe has improved with steroid cream.    Patient reports that she feels that she has her constipation under better control with taking MiraLAX daily.  She states that she got in trouble in the past due to stopping her MiraLAX when her stool was soft.  She has not had any more issues  with fecal impaction.  She does have an order for an enema per home health if needed.    She does state that she continues to feel weak but is optimistic about getting stronger and starting to ambulate with a walker.  Per patient's daughter-in-law per the oncologist stated that getting stronger outweighed the risk of her developing a fracture.    Patient's heart rate is back under 100 with restarting Bystolic 5 mg..  Patient denies any chest pain or heart palpitations.        The following portions of the patient's history were reviewed and updated as appropriate: allergies, current medications, past family history, past medical history, past social history, past surgical history and problem list.    Review of Systems   Cardiovascular:        Tachycardia   Musculoskeletal: Positive for gait problem (walking with a cane ).   Skin:        Small ulceration on right buttock, reported   All other systems reviewed and are negative.          Objective:    Physical Exam  Vitals reviewed.   Constitutional:       Comments: Frail, older than stated age No dentures in during visit   HENT:      Head: Normocephalic.      Nose: Nose normal.      Mouth/Throat:      Mouth: Mucous membranes  are moist.      Comments: Not wearing dentures  Eyes:      Extraocular Movements: Extraocular movements intact.      Conjunctiva/sclera: Conjunctivae normal.      Pupils: Pupils are equal, round, and reactive to light.   Pulmonary:      Effort: Pulmonary effort is normal. No respiratory distress.   Neurological:      Mental Status: She is alert and oriented to person, place, and time.   Psychiatric:         Mood and Affect: Mood normal.         Behavior: Behavior normal.         Thought Content: Thought content normal.         Judgment: Judgment normal.             Assessment:       1. Non-small cell lung cancer, right    2. Bone metastasis    3. Loss of appetite    4. Pressure injury of right buttock, stage 1    5. Atopic dermatitis, unspecified  type    6. Earlobe lesion, right    7. Drug-induced constipation    8. Tachycardia          Plan:       1-2 patient to keep her already scheduled appointment with Dr. Lorrin Jackson, oncology May 13.  Continue chemotherapy if approved by oncology  3. Improved. Continue Megace  4.  Improving.  Continue meta honey applications daily with her regular position changes  5.  Resolved with steroid and antifungal cream  6.  Improved with steroid cream  7.  Continue MiraLAX and stool softeners.  Increase fiber in diet  8.  Continue Bystolic 5 mg for tachycardia    Follow-up in 6 weeks via telehealth and as needed    Orders Placed This Encounter   . oxyCODONE-acetaminophen (PERCOCET) 10-325 MG per tablet   . Wound Dressings (Medihoney Wound/Burn Dressing) Gel

## 2020-10-15 ENCOUNTER — Encounter (INDEPENDENT_AMBULATORY_CARE_PROVIDER_SITE_OTHER): Payer: Self-pay

## 2020-10-15 ENCOUNTER — Encounter (HOSPITAL_COMMUNITY): Payer: Self-pay | Admitting: Internal Medicine

## 2020-10-17 ENCOUNTER — Encounter (HOSPITAL_COMMUNITY): Payer: Self-pay | Admitting: Hematology & Oncology

## 2020-10-18 ENCOUNTER — Inpatient Hospital Stay
Admission: RE | Admit: 2020-10-18 | Discharge: 2020-10-18 | Disposition: A | Discharge status: Still In Hospital | Payer: Medicare Other | Source: Ambulatory Visit | Attending: Hematology & Oncology | Admitting: Hematology & Oncology

## 2020-10-18 ENCOUNTER — Encounter (HOSPITAL_COMMUNITY): Payer: Self-pay

## 2020-10-18 ENCOUNTER — Other Ambulatory Visit: Payer: Self-pay

## 2020-10-18 ENCOUNTER — Encounter (HOSPITAL_COMMUNITY): Payer: 59 | Admitting: Hematology & Oncology

## 2020-10-18 ENCOUNTER — Encounter (HOSPITAL_COMMUNITY): Payer: Self-pay | Admitting: Hematology & Oncology

## 2020-10-18 VITALS — BP 109/70 | HR 97 | Temp 98.7°F | Resp 28 | Ht 60.98 in | Wt 89.2 lb

## 2020-10-18 DIAGNOSIS — C3491 Malignant neoplasm of unspecified part of right bronchus or lung: Secondary | ICD-10-CM | POA: Insufficient documentation

## 2020-10-18 DIAGNOSIS — C801 Malignant (primary) neoplasm, unspecified: Secondary | ICD-10-CM

## 2020-10-18 DIAGNOSIS — C3490 Malignant neoplasm of unspecified part of unspecified bronchus or lung: Secondary | ICD-10-CM

## 2020-10-18 DIAGNOSIS — C7951 Secondary malignant neoplasm of bone: Secondary | ICD-10-CM | POA: Insufficient documentation

## 2020-10-18 DIAGNOSIS — Z5111 Encounter for antineoplastic chemotherapy: Secondary | ICD-10-CM | POA: Insufficient documentation

## 2020-10-18 LAB — COMPREHENSIVE METABOLIC PANEL, NON-FASTING
ALBUMIN: 3.3 g/dL — ABNORMAL LOW (ref 3.4–4.8)
ALKALINE PHOSPHATASE: 85 U/L (ref 55–145)
ALT (SGPT): 20 U/L (ref 8–22)
ANION GAP: 7 mmol/L (ref 4–13)
AST (SGOT): 21 U/L (ref 8–45)
BILIRUBIN TOTAL: 0.2 mg/dL — ABNORMAL LOW (ref 0.3–1.3)
BUN/CREA RATIO: 21 (ref 6–22)
BUN: 15 mg/dL (ref 8–25)
CALCIUM: 9 mg/dL (ref 8.8–10.2)
CHLORIDE: 110 mmol/L (ref 96–111)
CO2 TOTAL: 24 mmol/L (ref 23–31)
CREATININE: 0.7 mg/dL (ref 0.60–1.05)
ESTIMATED GFR: >90 mL/min/BSA (ref >=60)
GLUCOSE: 114 mg/dL (ref 65–125)
POTASSIUM: 4.4 mmol/L (ref 3.5–5.1)
PROTEIN TOTAL: 6.9 g/dL (ref 6.0–8.0)
SODIUM: 141 mmol/L (ref 136–145)

## 2020-10-18 LAB — CBC WITH DIFF
BASOPHIL #: <0.1 10*3/uL (ref <=0.20)
BASOPHIL %: 1 %
EOSINOPHIL #: <0.1 10*3/uL (ref <=0.50)
EOSINOPHIL %: 1 %
HCT: 29.8 % — ABNORMAL LOW (ref 34.8–46.0)
HGB: 9.1 g/dL — ABNORMAL LOW (ref 11.5–16.0)
IMMATURE GRANULOCYTE #: <0.1 10*3/uL (ref <0.10)
IMMATURE GRANULOCYTE %: 1 % (ref 0–1)
LYMPHOCYTE #: 0.54 10*3/uL — ABNORMAL LOW (ref 1.00–4.80)
LYMPHOCYTE %: 13 %
MCH: 29.6 pg (ref 26.0–32.0)
MCHC: 30.5 g/dL — ABNORMAL LOW (ref 31.0–35.5)
MCV: 97.1 fL (ref 78.0–100.0)
MONOCYTE #: 0.72 10*3/uL (ref 0.20–1.10)
MONOCYTE %: 18 %
MPV: 9.6 fL (ref 8.7–12.5)
NEUTROPHIL #: 2.72 10*3/uL (ref 1.50–7.70)
NEUTROPHIL %: 66 %
PLATELETS: 297 10*3/uL (ref 150–400)
RBC: 3.07 10*6/uL — ABNORMAL LOW (ref 3.85–5.22)
RDW-CV: 19.7 % — ABNORMAL HIGH (ref 11.5–15.5)
WBC: 4.1 10*3/uL (ref 3.7–11.0)

## 2020-10-18 MED ORDER — SODIUM CHLORIDE 0.9% FLUSH BAG - 250 ML
INTRAVENOUS | Status: DC | PRN
Start: 2020-10-18 — End: 2020-10-19

## 2020-10-18 MED ORDER — SODIUM CHLORIDE 0.9 % INTRAVENOUS SOLUTION
302.0000 mg | Freq: Once | INTRAVENOUS | Status: AC
Start: 2020-10-18 — End: 2020-10-18
  Administered 2020-10-18: 300 mg via INTRAVENOUS
  Administered 2020-10-18: 0 mg via INTRAVENOUS
  Filled 2020-10-18: qty 30

## 2020-10-18 MED ORDER — SODIUM CHLORIDE 0.9 % INTRAVENOUS SOLUTION
375.0000 mg/m2 | Freq: Once | INTRAVENOUS | Status: AC
Start: 2020-10-18 — End: 2020-10-18
  Administered 2020-10-18: 500 mg via INTRAVENOUS
  Administered 2020-10-18: 0 mg via INTRAVENOUS
  Filled 2020-10-18: qty 20

## 2020-10-18 MED ORDER — SODIUM CHLORIDE 0.9 % INTRAVENOUS SOLUTION
12.0000 mg | Freq: Once | INTRAVENOUS | Status: AC
Start: 2020-10-18 — End: 2020-10-18
  Administered 2020-10-18: 12 mg via INTRAVENOUS
  Administered 2020-10-18: 0 mg via INTRAVENOUS
  Filled 2020-10-18: qty 3

## 2020-10-18 MED ORDER — ZOLEDRONIC ACID 4 MG/5 ML INTRAVENOUS SOLUTION
3.5000 mg | Freq: Once | INTRAVENOUS | Status: AC
Start: 2020-10-18 — End: 2020-10-18
  Administered 2020-10-18: 3.5 mg via INTRAVENOUS
  Administered 2020-10-18: 0 mg via INTRAVENOUS
  Filled 2020-10-18: qty 4.38

## 2020-10-18 MED ORDER — CYANOCOBALAMIN (VIT B-12) 1,000 MCG/ML INJECTION SOLUTION
1000.0000 ug | Freq: Once | INTRAMUSCULAR | Status: AC
Start: 2020-10-18 — End: 2020-10-18
  Administered 2020-10-18: 1000 ug via SUBCUTANEOUS
  Filled 2020-10-18: qty 1

## 2020-10-18 MED ORDER — SODIUM CHLORIDE 0.9 % INTRAVENOUS SOLUTION
150.0000 mg | Freq: Once | INTRAVENOUS | Status: AC
Start: 2020-10-18 — End: 2020-10-18
  Administered 2020-10-18: 0 mg via INTRAVENOUS
  Administered 2020-10-18: 150 mg via INTRAVENOUS
  Filled 2020-10-18: qty 5

## 2020-10-18 MED ORDER — SODIUM CHLORIDE 0.9 % (FLUSH) INJECTION SYRINGE
10.0000 mL | INJECTION | Freq: Once | INTRAMUSCULAR | Status: AC
Start: 2020-10-18 — End: 2020-10-18
  Administered 2020-10-18: 10 mL

## 2020-10-18 MED ORDER — HEPARIN, PORCINE (PF) 100 UNIT/ML INTRAVENOUS SYRINGE
5.0000 mL | INJECTION | Freq: Once | INTRAVENOUS | Status: AC
Start: 2020-10-18 — End: 2020-10-18
  Administered 2020-10-18: 5 mL
  Filled 2020-10-18: qty 5

## 2020-10-18 MED ORDER — PALONOSETRON 0.25 MG/5 ML INTRAVENOUS SOLUTION
0.2500 mg | Freq: Once | INTRAVENOUS | Status: AC
Start: 2020-10-18 — End: 2020-10-18
  Administered 2020-10-18: 0.25 mg via INTRAVENOUS
  Filled 2020-10-18: qty 5

## 2020-10-18 NOTE — Nurses Notes (Signed)
Patient discharged home.  AVS reviewed with patient.  A written copy of the AVS and discharge instructions was given to the patient.  Questions sufficiently answered as needed.  Patient encouraged to follow up with PCP as indicated.  In the event of an emergency, patient instructed to call 911 or go to the nearest emergency room.

## 2020-10-18 NOTE — Discharge Instructions (Signed)
Patient Education     Discharge Instructions for Chemotherapy  Your healthcare provider prescribed a type of medicine therapy for you called chemotherapy. It's also known as chemo. Chemo is used for many different types of illnesses, including cancer. There are many types of chemo. This sheet gives some general information on how you can take care ofyourself after your chemo. Talk with your healthcare provider about other details based on your own treatment plan.  Mouth care  You may get mouth sores, even if you follow all of your healthcare provider's instructions. Many people get mouth sores as a side effect of chemo. Here's what you can do to help prevent mouth sores:   Keep your mouth clean. Brush your teeth with a soft-bristle toothbrush after every meal.   Ask if you should use a toothpaste with fluoride. Or you may be told to brush your teeth with a mixture of 1 teaspoon of salt in 8 ounces of water.   Use an oral swab or special soft toothbrush if your gums bleed during brushing.   Don't use dental floss if it causes your gums to bleed.   Use any mouthwash given to you as directed.   If you can't tolerate regular methods, use salt and baking soda to clean your mouth. Mix1teaspoonof salt and1teaspoon of baking soda in 1 quart of warm water. Swish and spit.   If you wear dentures, you may be told to wear them only when you eat. Ask your healthcare provider. Clean your dentures twice a day. Soak them in antimicrobial solution when you aren't wearing them. Rinse your mouth after each meal.   Check your mouth and tonguefor white patches. Thismay bea sign of a type of yeast infection called thrush. This is a common side effect of chemo. Tell your healthcare provider if you get these patches. He or she can prescribe medicine to treat them.  Other self-care  Here's what else you can do:   Try to exercise. Exercise keeps you strong and keeps your heart and lungs active. Walking and yoga are good  types of exercise.   Keep clean. During chemo, your body can't fight infection very well. Take short baths or showers. Wash your hands before you eat and after going to the bathroom.   Avoid people who are sick with an illness you could catch. This includes people with colds, flu, measles, or chicken pox. This also includes people who have recently had a vaccine for any illness.   Let your healthcare provider know if your throat is sore. You may have an infection that needs treatment.   Be gentle to your skin. Use moisturizing soap. Chemo can make your skin dry. Apply lotion several times a day to help relieve dry skin. Don't take very hot or very cold showers or baths.   Don't be surprised if your chemo causes slight burns to your skin. This can happen most often on the hands and feet. Some medicines used in high doses cause this. Ask for a special cream to help relieve the burn and protect your skin.  Many people on chemo feel sick and find it hard to eat during treatment. Try these tips:   Eat small meals several times a day to keep your strength up.   Choose bland foods with little taste or smell if you are reacting strongly to food.   Be sure to cook all food fully. This kills bacteria and helps you prevent illness.   Eat foods that   are soft. Soft foods are less likely to cause stomach irritation.   Try to eat a variety of foods for a well-balanced diet. Drink plenty of fluids. Eat foods with fiber unless your healthcare provider says not to. Fiber can help to prevent constipation.  When to call your healthcare provider  Call your healthcare provider right away if you have any of the following:   Unexplained bleeding   Trouble concentrating   Ongoing fatigue   Shortness of breath, wheezing, trouble breathing, or bad cough   Rapid, irregular heartbeat, or chest pain   Dizziness, lightheadedness   Constant feeling of being cold   Hives oracut or rash that swells, turns red, feels hot or  painful, or begins to ooze   Burning when you urinate   Feverof100.4F (38C) orhigher, oras directed by your healthcare provider  [employer] last reviewed this educational content on 10/14/2017   2000-2021 [employer]. All rights reserved. This information is not intended as a substitute for professional medical care. Always follow your healthcare professional's instructions.

## 2020-10-18 NOTE — Nurses Notes (Signed)
Corrected calcium is 9.56.  Creatinine clearance is 50.5446.

## 2020-10-19 ENCOUNTER — Encounter (INDEPENDENT_AMBULATORY_CARE_PROVIDER_SITE_OTHER): Payer: Self-pay

## 2020-10-19 NOTE — Progress Notes (Signed)
Scanned

## 2020-10-22 ENCOUNTER — Other Ambulatory Visit (INDEPENDENT_AMBULATORY_CARE_PROVIDER_SITE_OTHER): Payer: Self-pay | Admitting: Family

## 2020-10-23 ENCOUNTER — Encounter (INDEPENDENT_AMBULATORY_CARE_PROVIDER_SITE_OTHER): Payer: Self-pay

## 2020-10-25 ENCOUNTER — Encounter (INDEPENDENT_AMBULATORY_CARE_PROVIDER_SITE_OTHER): Payer: Self-pay

## 2020-10-25 ENCOUNTER — Ambulatory Visit: Payer: Medicare Other | Attending: Hematology & Oncology | Admitting: Hematology & Oncology

## 2020-10-25 ENCOUNTER — Other Ambulatory Visit: Payer: Self-pay

## 2020-10-25 ENCOUNTER — Encounter (HOSPITAL_COMMUNITY): Payer: Self-pay | Admitting: Hematology & Oncology

## 2020-10-25 VITALS — BP 125/64 | HR 91 | Temp 98.7°F | Resp 20 | Ht 60.98 in | Wt 88.1 lb

## 2020-10-25 DIAGNOSIS — C3491 Malignant neoplasm of unspecified part of right bronchus or lung: Secondary | ICD-10-CM | POA: Insufficient documentation

## 2020-10-25 DIAGNOSIS — T451X5A Adverse effect of antineoplastic and immunosuppressive drugs, initial encounter: Secondary | ICD-10-CM | POA: Insufficient documentation

## 2020-10-25 DIAGNOSIS — R269 Unspecified abnormalities of gait and mobility: Secondary | ICD-10-CM

## 2020-10-25 DIAGNOSIS — Z87891 Personal history of nicotine dependence: Secondary | ICD-10-CM | POA: Insufficient documentation

## 2020-10-25 DIAGNOSIS — C7951 Secondary malignant neoplasm of bone: Secondary | ICD-10-CM | POA: Insufficient documentation

## 2020-10-25 DIAGNOSIS — D6481 Anemia due to antineoplastic chemotherapy: Secondary | ICD-10-CM

## 2020-10-25 DIAGNOSIS — Z79899 Other long term (current) drug therapy: Secondary | ICD-10-CM | POA: Insufficient documentation

## 2020-10-25 DIAGNOSIS — G893 Neoplasm related pain (acute) (chronic): Secondary | ICD-10-CM | POA: Insufficient documentation

## 2020-10-25 DIAGNOSIS — R630 Anorexia: Secondary | ICD-10-CM

## 2020-10-25 DIAGNOSIS — Z681 Body mass index (BMI) 19 or less, adult: Secondary | ICD-10-CM | POA: Insufficient documentation

## 2020-10-25 NOTE — Progress Notes (Signed)
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  She is in clinic today with her daughter in law and her husband.  Both report that she has been eating better and taking approximately 3 ensures a day in addition to small meals and snacks all day.  She brought in her recorded intake.  I started her arm mirtazapine him 15 mg and she is tolerating this well.  The She feels better and continues to work with physical therapy.  She was able to do stairs at home but overall her gait remains suboptimal to her usual baseline.  She has no other complaints and tolerated her chemotherapy last week.  Her bowel is moving.  She is not in pain.  She has no cardiopulmonary, GI, or GU symptoms.    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  -PET-CT scan on 09/06/2016 showed 10 mm right upper lobe FDG avid lesion that is slightly larger than 2017 CT and concerning for malignancy.  A separate 3 cm long area of tracer uptake in the left lower lobe was noted.  -CT-guided biopsy a [facility] on 10/02/2016 was positive for adenocarcinoma that is well to moderately  differentiated.  -Mediastinoscopy 11/13/2016 positive for left mid paratrachael    Stage: IV, initially diagnosed as IIIB     Molecular/special studies:  ROS-1, ALK-1, EGFR, PDL1 all negative    Treatment & Surveillance:   1. S/p chemoradiation with carboplatin + paclitaxel + RT 66.6 Gy completed 02/10/19/18    2. Serial imaging since completion of therapy has been negative for recurrence.  Most recent imaging in on 12/03/2018 was also negative but showed a new T6 compression abnormality.     3. Chest CT with contrast from 09/23/2019 which in summary showed increasing pleural based density in the LLL which may be infectious or inflammatory.  There is increased 1.7cm LN in left hilum ans stable compression abnormality of T6.    4. Biopsy of left LL pleural bases mass on 11/02/2019 showed benign fibrovascular tissue with focal chronic inflammation.  no lung parechyma or malgnancy identified.    5. Patient developed progressive musculoskeletal pain around October /November of 2021 evaluation with multiple imaging studies highly suggest metastatic disease.  Evaluation for plasma cell was noncontributory.  Patient was referred to orthopedic surgery (Dr. Hervey Ard, by family request and known to patient)  for surgical recommendation especially regarding weight-bearing.    6. Biopsy of paraspinal soft tissue mass on 07/04/2020 confirmed metastatic carcinoma consistent with lung primary. CARIS NGS negative    7.Radiation Oncology for radiation to the acetabulum as well as the paraspinal mass started 07/19/2020 x 10 , ending 07/31/2020    8. Mediport placed  07/19/2020  9.Begin Zoledronic acid.  Has dentures and no dental eval needed    10. Brain MRI planne2/12/2020     11.  Begin palliative carboplatin & pemetrexed on 08/03/2020       ________________________________________________________________________________________________________________________________________________     Objective   Objective:   BP 125/64 (Site: Left,  Patient Position: Sitting)   Pulse 91   Temp 37.1 C (98.7 F) (Thermal Scan)   Resp 20   Ht 1.549 m (5' 0.98") Comment: *  Wt (!) 40 kg (88 lb 2 oz)   SpO2 99%   BMI 16.66 kg/m       ECOG Status: 2 - Ambulatory and capable of all selfcare, but unable to carry out any work activities.  Up and about more than 50% of waking hours.    Physical Exam  Vitals reviewed.   Constitutional:       General: She is not in acute distress.     Comments: Still frail but improved   Cardiovascular:      Rate and Rhythm: Normal rate and regular rhythm.      Pulses: Normal pulses.      Heart sounds: Normal heart sounds.   Abdominal:      Palpations: Abdomen is soft.   Musculoskeletal:      Right lower leg: No edema.      Left lower leg: No edema.   Skin:     Coloration: Skin is pale.   Neurological:      Mental Status: She is alert.          Past Medical History:   Diagnosis Date   . Abnormal Pap smear     ASCUS, ASCUS cannot rule out HGSIL   . Cancer (CMS HCC)     non small cell lung cancer (R)-s/p Chemo/Radiation completed 8/18   . Colon polyp    . Dysphagia    . Hypothyroid    . Non-small cell lung cancer, right (CMS [facility])     s/p Chemo completed 9/18   . Raynaud's disease    . STD (sexually transmitted disease)     HSV   . Unspecified disorder of thyroid     Hypothyroidism         Current Outpatient Medications   Medication Sig   . Artifi. Tear, Hypromellose,-PF (RETAINE HPMC, PF,) 0.3 % Ophthalmic Drops Administer into affected eye(s) Once per day as needed   . carboxymethyl/glycerin/poly80 (REFRESH OPTIVE ADVANCED OPHT) Apply to eye Once per day as needed   . carboxymethylcellulose Sodium (THERATEARS) 0.25 % Ophthalmic Drops 1-2 Drops Once per day as needed   . folic acid (FOLVITE) 1 mg Oral Tablet TAKE ONE TABLET BY MOUTH EVERY DAY   . levothyroxine (SYNTHROID) 88 mcg Oral Tablet TAKE ONE TABLET BY MOUTH EVERY DAY   . loratadine (CLARITIN) 10 mg Oral Tablet take 10 mg by mouth Once a day.   Marland Kitchen MEDIHONEY, HONEY, 80 % Gel  Twice daily to affected area(s)   . megestroL (MEGACE) 40 mg Oral Tablet Take 40 mg by mouth   . mirtazapine (REMERON) 15 mg Oral Tablet Take 1 Tablet (15 mg total) by mouth Every night   . MULTIVITAMIN ORAL Take by mouth Once a day Centrum Silver Women's 50+   . nebivoloL (BYSTOLIC) 5 mg Oral Tablet    . Nifedipine (ADALAT CC) 30 mg Oral Tablet Sustained Release take 30 mg by mouth Once a day.   . ondansetron (ZOFRAN ODT) 8 mg Oral Tablet, Rapid Dissolve Place  1 Tablet (8 mg total) under the tongue Every 8 hours as needed for Nausea/Vomiting Indications: nausea and vomiting caused by cancer drugs   . oxyCODONE-acetaminophen (PERCOCET) 10-325 mg Oral Tablet Take 1 Tablet by mouth Every 6 hours as needed for Pain for up to 30 days   . polyethylene glycol (MIRALAX) 17 gram Oral Powder in Packet Take 1 Packet (17 g total) by mouth Once a day   . prochlorperazine (COMPAZINE) 10 mg Oral Tablet Take 1 Tablet (10 mg total) by mouth Four times a day as needed for Nausea/Vomiting   . sennosides-docusate sodium (SENOKOT-S) 8.6-50 mg Oral Tablet Take 1 Tablet by mouth Every evening   . tiotropium-olodateroL (STIOLTO RESPIMAT) 2.5-2.5 mcg/actuation Inhalation Mist Take 2 INHALATION by inhalation Once a day (Patient taking differently: Take 2 INHALATION by inhalation Once a day inhaler)   . UNKNOWN MEDICATION (UNKNOWN MEDICATION) Administer into affected eye(s) [name] 128 Eye Drops OTC 4x daily  [name] 128 Eye Gel OTC Nightly     Allergies as of 10/25/2020 - Reviewed 10/25/2020   Allergen Reaction Noted   . Penicillins  08/15/2010       Social History  Occupation:   Social History     Occupational History     Employer: [employer] [id number]   reports that she quit smoking about 22 years ago. Her smoking use included cigarettes. She started smoking about 49 years ago. She quit after 20.00 years of use. She has never used smokeless tobacco. She reports being sexually active and has had partner(s) who  are female. She reports using the following method of birth control/protection: None. She reports that she does not drink alcohol and does not use drugs.    Labs:  Labs reviewed and interpreted:  Results for Gina, Barrett (MRN [id number]) as of 10/25/2020 17:19   Ref. Range 10/18/2020 13:41   WBC Latest Ref Range: 3.7 - 11.0 x10^3/uL 4.1   HGB Latest Ref Range: 11.5 - 16.0 g/dL 9.1 (L)   HCT Latest Ref Range: 34.8 - 46.0 % 29.8 (L)   PLATELET COUNT Latest Ref Range: 150 - 400 x10^3/uL 297   RBC Latest Ref Range: 3.85 - 5.22 x10^6/uL 3.07 (L)   MCV Latest Ref Range: 78.0 - 100.0 fL 97.1   MCHC Latest Ref Range: 31.0 - 35.5 g/dL 30.5 (L)   MCH Latest Ref Range: 26.0 - 32.0 pg 29.6   RDW-CV Latest Ref Range: 11.5 - 15.5 % 19.7 (H)   MPV Latest Ref Range: 8.7 - 12.5 fL 9.6   PMN'S Latest Units: % 66   LYMPHOCYTES Latest Units: % 13   EOSINOPHIL Latest Units: % 1   MONOCYTES Latest Units: % 18   BASOPHILS Latest Units: % 1   IMMATURE GRANULOCYTE % Latest Ref Range: 0 - 1 % 1   IMMATURE GRANULOCYTE # Latest Ref Range: <0.10 x10^3/uL <0.10   PMN ABS Latest Ref Range: 1.50 - 7.70 x10^3/uL 2.72   LYMPHS ABS Latest Ref Range: 1.00 - 4.80 x10^3/uL 0.54 (L)   EOS ABS Latest Ref Range: <=0.50 x10^3/uL <0.10   MONOS ABS Latest Ref Range: 0.20 - 1.10 x10^3/uL 0.72   BASOS ABS Latest Ref Range: <=0.20 x10^3/uL <0.10   SODIUM Latest Ref Range: 136 - 145 mmol/L 141   POTASSIUM Latest Ref Range: 3.5 - 5.1 mmol/L 4.4   CHLORIDE Latest Ref Range: 96 - 111 mmol/L 110   CARBON DIOXIDE Latest Ref Range: 23 - 31  mmol/L 24   BUN Latest Ref Range: 8 - 25 mg/dL 15   CREATININE Latest Ref Range: 0.60 - 1.05 mg/dL 0.70   GLUCOSE Latest Ref Range: 65 - 125 mg/dL 114   ANION GAP Latest Ref Range: 4 - 13 mmol/L 7   BUN/CREAT RATIO Latest Ref Range: 6 - 22  21   ESTIMATED GLOMERULAR FILTRATION RATE Latest Ref Range: >=60 mL/min/BSA >90   CALCIUM Latest Ref Range: 8.8 - 10.2 mg/dL 9.0   TOTAL PROTEIN Latest Ref Range: 6.0 - 8.0 g/dL 6.9   ALBUMIN  Latest Ref Range: 3.4 - 4.8 g/dL  3.3 (L)   BILIRUBIN, TOTAL Latest Ref Range: 0.3 - 1.3 mg/dL 0.2 (L)   AST (SGOT) Latest Ref Range: 8 - 45 U/L 21   ALT (SGPT) Latest Ref Range: 8 - 22 U/L 20   ALKALINE PHOSPHATASE Latest Ref Range: 55 - 145 U/L 85       Radiology: All recent pertinent radiologic images and reports reviewed          Assessment/Plan:     1. Non-small cell lung cancer, right (CMS [facility])  65 y/o woman with stage IIIB lung cancer diagnosed 2018 who has metastatic recurrene of disease with unfortunate significant ossous complication before being discovered.  Her performance status has continued to decline despite her best intention and compounded by poor nutrition.  After 2 cycles of Carboplatin and Pemetrexed, I do not think she can continue at current performance status of 3-4.  I will give another 2 weeks to improve nutrition and PS (getting PT) and if unsuccessful, we will need to discuss hospice.    2. Bone metastasis (CMS HCC)  -Continue zoledronic acid     3. Cancer associated pain  -Pain controlled    4. Functional gait disorder  -slightly improved  -continue physical therapy    5. Anorexia  -improving on mirtizapine    6. Antineoplastic chemotherapy induced anemia  Stable and relatively asymptomatic      Return in about 4 weeks (around 11/22/2020).    Mertie Moores, MD        CC:  PCP General:  Lanier Ensign, FNP-C  [facility] [address]  [place] [address]      Portions of this note may be dictated using voice recognition software or a dictation service. Variances in spelling and vocabulary are possible and unintentional. Not all errors are caught/corrected. Please notify the [facility] if any discrepancies are noted or if the meaning of any statement is not clear.

## 2020-10-29 NOTE — Progress Notes (Signed)
Name: Julia Ware  MRN/ DOB: [id number], [birth year]    Age/ Sex: 65 y.o., female    PCP: Andree Moro, DO   Reason for Endocrinology Evaluation: MNG     Date of Initial Endocrinology Evaluation: 10/30/2020     HPI: [name] is a 65 y.o. female with a past medical history of HTN. The patient presented for initial endocrinology clinic visit on 10/30/2020 for consultative assistance with her  MNG  She has an incidental finding of thyroid nodule in CT scan in 2009. This prompted thyroid ultrasound confirming MNG with hx of benign FNA in 2010 ( right inferior ) and 2012 ( right superior ) nodules   She also had a thyroid uptake and scan in 2009 with hyperthyroidism with normal uptake at 16.8%.   She has noted increase in neck size that she attributes to weight gain  Denies dysphagia , pain or choking sensation   Denies weight loss loss  Denies diarrhea or loose stools Has palpitations  Denies tremors  Or anxiety   No biotin intake  No radiation exposure      No FH of thyroid disease     HISTORY:   Past Medical History: No past medical history on file.  Past Surgical History: No past surgical history on file.   Social History:  reports that she has never smoked. She has never used smokeless tobacco. She reports current alcohol use. She reports that she does not use drugs.  Family History: family history includes Diabetes in her mother; Hyperlipidemia in her father; Hypertension in her father and mother; Kidney disease in her father.   HOME MEDICATIONS: Allergies as of 10/30/2020   No Known Allergies     Medication List       Accurate as of Oct 30, 2020  8:58 AM. If you have any questions, ask your nurse or doctor.        acetaminophen 500 MG tablet Commonly known as: TYLENOL Take 1,000 mg by mouth every 6 (six) hours as needed (pain).   carvedilol 6.25 MG tablet Commonly known as: COREG Take 6.25 mg by mouth 2 (two) times daily.    lisinopril-hydrochlorothiazide 20-12.5 MG tablet Commonly known as: ZESTORETIC Take 1 tablet by mouth in the morning.         REVIEW OF SYSTEMS: A comprehensive ROS was conducted with the patient and is negative except as per HPI     OBJECTIVE:  VS: BP (!) 144/90   Pulse 80   Ht 5\' 7"  (1.702 m)   Wt (!) 351 lb 4 oz (159.3 kg)   SpO2 98%   BMI 55.01 kg/m    Wt Readings from Last 3 Encounters:  10/30/20 (!) 351 lb 4 oz (159.3 kg)  09/30/06 (!) 320 lb (145.2 kg)     EXAM: General: Pt appears well and is in NAD  Neck: General: Supple without adenopathy. Thyroid: Right thyroid nodules appreciated.   Lungs: Clear with good BS bilat with no rales, rhonchi, or wheezes  Heart: Auscultation: RRR.  Abdomen: Normoactive bowel sounds, soft, nontender, without masses or organomegaly palpable  Extremities:  BL LE: No pretibial edema normal ROM and strength.  Mental Status: Judgment, insight: Intact Memory: Intact for recent and remote events Mood and affect: No depression, anxiety, or agitation     DATA REVIEWED: Results for Julia, Ware (MRN [id number]) as of 10/31/2020 16:19  Ref. Range 10/30/2020 09:07  TSH Latest Ref Range: 0.35 - 4.50 uIU/mL 0.24 (  L)  T4,Free(Direct) Latest Ref Range: 0.60 - 1.60 ng/dL 1.10      FNA right superior 3.1 cm 04/23/2011: Benign     FNA right lower pole nodule 4 cm 2/9/20210: benign    Thyroid Ultrasound 08/20/2020   Estimated total number of nodules >/= 1 cm: 5  Number of spongiform nodules >/=  2 cm not described below (TR1): 0  Number of mixed cystic and solid nodules >/= 1.5 cm not described below ([place]): 0  _________________________________________________________  The previously biopsied approximately 3.7 x 3.0 x 2.2 cm spongiform/benign-appearing mass involving the superior aspect the right lobe of the thyroid (labeled 1), is unchanged compared to the 04/23/2011 examination, previously, 4.6 x 3.0 x 2.2 cm.  Correlation with previous biopsy results is advised.  Previously noted 4.6 cm isoechoic ill-defined nodule involving the mid, posterior aspect the right lobe of the thyroid is not definitely seen on the present examination and thus may have represented a pseudonodule in the setting of multinodular goiter.  The approximately 2.2 x 2.1 x 1.5 cm isoechoic nodule within the inferior medial aspect of the right lobe of the thyroid (labeled 3), is unchanged compared to the 08/2013 examination, previously, 2.4 x 2.1 x 1.3 cm. Stability for greater than 5 years is indicative benign etiology.  _________________________________________________________  The approximately 1.3 cm partially cystic though predominantly solid nodule within the inferior pole of the left lobe of the thyroid (labeled 4) is grossly unchanged compared to the 2015 examination, previously, 1.9 cm. Imaging stability for greater than 5 years is indicative of a benign etiology.  The approximately 2.0 cm spongiform/benign-appearing nodule within mid aspect the left lobe of the thyroid (labeled 5) is grossly unchanged compared to the 2015 examination, previously, 2.1 cm. Imaging stability for greater than 5 years is indicative of benign etiology.  IMPRESSION: 1. 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 No worrisome new or enlarging thyroid nodules. 2. Previously biopsied dominant spongiform/benign-appearing right-sided thyroid nodule/mass is grossly unchanged compared to the 20/12 examination. Correlation with previous biopsy results is advised. Additionally, imaging stability for greater than 5 years is indicative of a benign etiology. 3. The remaining thyroid nodules are grossly unchanged since at least the 2015 examination. Imaging stability for greater than 5 years is indicative of a benign etiology.  ASSESSMENT/PLAN/RECOMMENDATIONS:   1. MNG:    - I have reviewed her ultrasound results as  well previous FNA results with benign cytology  - Reassurance has been provided as these nodules have not increased in size in over 5 yrs.  - No serial thyroid ultrasound are indicated unless there's a clinical concern about change is size - I explained to her that if she starts experiencing local neck symptoms, total thyroidectomy would be recommended    2. 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 - She had low TSH in 2009, she had thyroid uptake and scan at the time with normal 24-hour iron-131 uptake at 16.8 % -At this time she is clinically euthyroid -We will consider treatment of her TSH trends down to 0.1 u IU/mL -We will repeat labs at next visit   F/U 6 months Addendum: Labs discussed with the patient on 10/31/2020    Signed electronically by: Mack Guise, MD  [facility] Endocrinology  [facility] [address] [place], [place] [address] Phone: [phone] FAX: [phone]   CC: Andree Moro, [place] [address] Phone: [phone] Fax: [phone]   Return  to Endocrinology clinic as below: Future Appointments  Date Time Provider [place]  11/16/2020  3:40 PM [facility] 3 [facility]

## 2020-10-30 ENCOUNTER — Encounter: Payer: Self-pay | Admitting: Internal Medicine

## 2020-10-30 ENCOUNTER — Ambulatory Visit: Payer: Medicare Other | Admitting: Internal Medicine

## 2020-10-30 ENCOUNTER — Other Ambulatory Visit: Payer: Self-pay

## 2020-10-30 VITALS — BP 144/90 | HR 80 | Ht 67.0 in | Wt 351.2 lb

## 2020-10-30 DIAGNOSIS — E042 Nontoxic multinodular goiter: Secondary | ICD-10-CM | POA: Diagnosis not present

## 2020-10-30 DIAGNOSIS — E0590 Thyrotoxicosis, unspecified without thyrotoxic crisis or storm: Secondary | ICD-10-CM | POA: Diagnosis not present

## 2020-10-30 LAB — TSH: TSH: 0.24 u[IU]/mL — ABNORMAL LOW (ref 0.35–4.50)

## 2020-10-30 LAB — T4, FREE: Free T4: 1.1 ng/dL (ref 0.60–1.60)

## 2020-10-30 NOTE — Patient Instructions (Signed)
-   Your thyroid nodules are stable . This is a good sign. If you notice increase in size, difficulty swallowing or breathing because of thyroid nodules, it will be time to see the surgeon to have it taken out.

## 2020-10-31 ENCOUNTER — Telehealth: Payer: Self-pay | Admitting: Internal Medicine

## 2020-10-31 DIAGNOSIS — E042 Nontoxic multinodular goiter: Secondary | ICD-10-CM | POA: Insufficient documentation

## 2020-10-31 DIAGNOSIS — E0590 Thyrotoxicosis, unspecified without thyrotoxic crisis or storm: Secondary | ICD-10-CM | POA: Insufficient documentation

## 2020-10-31 NOTE — Telephone Encounter (Signed)
Discussed TFT results with the pt on 10/31/2020 at 11 Am    Discussed subclinical hyperthyroidism. She understands the risk of cardiac arrhythmia and increase bone resorption.   At this time no treatment is needed, will treat if TSh 0.1 uIU/mL    Will schedule a follow up in 6 months   Lake Katrine, MD  [facility] Endocrinology  [facility] [address] [place], [place] [address] Phone: [phone] FAX: [phone]

## 2020-10-31 NOTE — Telephone Encounter (Signed)
Patient has been scheduled for 6 month follow up at [facility] location on 04/30/21

## 2020-11-01 ENCOUNTER — Encounter (INDEPENDENT_AMBULATORY_CARE_PROVIDER_SITE_OTHER): Payer: Self-pay

## 2020-11-07 ENCOUNTER — Encounter (HOSPITAL_COMMUNITY): Payer: Self-pay | Admitting: Hematology & Oncology

## 2020-11-08 ENCOUNTER — Other Ambulatory Visit: Payer: Self-pay

## 2020-11-08 ENCOUNTER — Inpatient Hospital Stay (HOSPITAL_COMMUNITY)
Admission: RE | Admit: 2020-11-08 | Discharge: 2020-11-08 | Disposition: A | Discharge status: Still In Hospital | Payer: Medicare Other | Source: Ambulatory Visit | Attending: Hematology & Oncology | Admitting: Hematology & Oncology

## 2020-11-08 VITALS — BP 118/57 | HR 96 | Temp 98.4°F | Resp 30 | Ht 60.98 in | Wt 90.8 lb

## 2020-11-08 DIAGNOSIS — C3491 Malignant neoplasm of unspecified part of right bronchus or lung: Secondary | ICD-10-CM

## 2020-11-08 DIAGNOSIS — C7951 Secondary malignant neoplasm of bone: Secondary | ICD-10-CM

## 2020-11-08 DIAGNOSIS — Z7951 Long term (current) use of inhaled steroids: Secondary | ICD-10-CM | POA: Insufficient documentation

## 2020-11-08 DIAGNOSIS — Z5111 Encounter for antineoplastic chemotherapy: Secondary | ICD-10-CM | POA: Insufficient documentation

## 2020-11-08 DIAGNOSIS — C801 Malignant (primary) neoplasm, unspecified: Secondary | ICD-10-CM

## 2020-11-08 LAB — CBC WITH DIFF
BASOPHIL #: <0.1 10*3/uL (ref <=0.20)
BASOPHIL %: 0 %
EOSINOPHIL #: <0.1 10*3/uL (ref <=0.50)
EOSINOPHIL %: 1 %
HCT: 28.4 % — ABNORMAL LOW (ref 34.8–46.0)
HGB: 9 g/dL — ABNORMAL LOW (ref 11.5–16.0)
IMMATURE GRANULOCYTE #: <0.1 10*3/uL (ref <0.10)
IMMATURE GRANULOCYTE %: 0 % (ref 0–1)
LYMPHOCYTE #: 0.54 10*3/uL — ABNORMAL LOW (ref 1.00–4.80)
LYMPHOCYTE %: 18 %
MCH: 30.8 pg (ref 26.0–32.0)
MCHC: 31.7 g/dL (ref 31.0–35.5)
MCV: 97.3 fL (ref 78.0–100.0)
MONOCYTE #: 0.65 10*3/uL (ref 0.20–1.10)
MONOCYTE %: 22 %
MPV: 9 fL (ref 8.7–12.5)
NEUTROPHIL #: 1.72 10*3/uL (ref 1.50–7.70)
NEUTROPHIL %: 59 %
PLATELETS: 217 10*3/uL (ref 150–400)
RBC: 2.92 10*6/uL — ABNORMAL LOW (ref 3.85–5.22)
RDW-CV: 17.3 % — ABNORMAL HIGH (ref 11.5–15.5)
WBC: 3 10*3/uL — ABNORMAL LOW (ref 3.7–11.0)

## 2020-11-08 LAB — COMPREHENSIVE METABOLIC PANEL, NON-FASTING
ALBUMIN: 3.5 g/dL (ref 3.4–4.8)
ALKALINE PHOSPHATASE: 78 U/L (ref 55–145)
ALT (SGPT): 15 U/L (ref 8–22)
ANION GAP: 7 mmol/L (ref 4–13)
AST (SGOT): 21 U/L (ref 8–45)
BILIRUBIN TOTAL: 0.2 mg/dL — ABNORMAL LOW (ref 0.3–1.3)
BUN/CREA RATIO: 19 (ref 6–22)
BUN: 14 mg/dL (ref 8–25)
CALCIUM: 8.6 mg/dL — ABNORMAL LOW (ref 8.8–10.2)
CHLORIDE: 109 mmol/L (ref 96–111)
CO2 TOTAL: 24 mmol/L (ref 23–31)
CREATININE: 0.73 mg/dL (ref 0.60–1.05)
ESTIMATED GFR: 86 mL/min/BSA (ref >=60)
GLUCOSE: 90 mg/dL (ref 65–125)
POTASSIUM: 4.4 mmol/L (ref 3.5–5.1)
PROTEIN TOTAL: 6.9 g/dL (ref 6.0–8.0)
SODIUM: 140 mmol/L (ref 136–145)

## 2020-11-08 MED ORDER — SODIUM CHLORIDE 0.9 % (FLUSH) INJECTION SYRINGE
10.0000 mL | INJECTION | Freq: Once | INTRAMUSCULAR | Status: AC
Start: 2020-11-08 — End: 2020-11-08
  Administered 2020-11-08 (×2): 10 mL

## 2020-11-08 MED ORDER — PALONOSETRON 0.25 MG/5 ML INTRAVENOUS SOLUTION
0.2500 mg | Freq: Once | INTRAVENOUS | Status: AC
Start: 2020-11-08 — End: 2020-11-08
  Administered 2020-11-08: 0.25 mg via INTRAVENOUS
  Filled 2020-11-08: qty 5

## 2020-11-08 MED ORDER — CYANOCOBALAMIN (VIT B-12) 1,000 MCG/ML INJECTION SOLUTION
1000.0000 ug | Freq: Once | INTRAMUSCULAR | Status: AC
Start: 2020-11-08 — End: 2020-11-08
  Administered 2020-11-08: 1000 ug via SUBCUTANEOUS
  Filled 2020-11-08: qty 1

## 2020-11-08 MED ORDER — SODIUM CHLORIDE 0.9% FLUSH BAG - 250 ML
INTRAVENOUS | Status: DC | PRN
Start: 2020-11-08 — End: 2020-11-09

## 2020-11-08 MED ORDER — SODIUM CHLORIDE 0.9 % INTRAVENOUS SOLUTION
375.0000 mg/m2 | Freq: Once | INTRAVENOUS | Status: AC
Start: 2020-11-08 — End: 2020-11-08
  Administered 2020-11-08: 0 mg via INTRAVENOUS
  Administered 2020-11-08: 500 mg via INTRAVENOUS
  Filled 2020-11-08: qty 20

## 2020-11-08 MED ORDER — SODIUM CHLORIDE 0.9 % INTRAVENOUS SOLUTION
150.0000 mg | Freq: Once | INTRAVENOUS | Status: AC
Start: 2020-11-08 — End: 2020-11-08
  Administered 2020-11-08: 150 mg via INTRAVENOUS
  Administered 2020-11-08: 0 mg via INTRAVENOUS
  Filled 2020-11-08: qty 5

## 2020-11-08 MED ORDER — SODIUM CHLORIDE 0.9 % INTRAVENOUS SOLUTION
12.0000 mg | Freq: Once | INTRAVENOUS | Status: AC
Start: 2020-11-08 — End: 2020-11-08
  Administered 2020-11-08: 0 mg via INTRAVENOUS
  Administered 2020-11-08: 12 mg via INTRAVENOUS
  Filled 2020-11-08: qty 3

## 2020-11-08 MED ORDER — HEPARIN, PORCINE (PF) 100 UNIT/ML INTRAVENOUS SYRINGE
5.0000 mL | INJECTION | Freq: Once | INTRAVENOUS | Status: AC
Start: 2020-11-08 — End: 2020-11-08
  Administered 2020-11-08: 5 mL
  Filled 2020-11-08: qty 5

## 2020-11-08 MED ORDER — SODIUM CHLORIDE 0.9 % INTRAVENOUS SOLUTION
297.2000 mg | Freq: Once | INTRAVENOUS | Status: AC
Start: 2020-11-08 — End: 2020-11-08
  Administered 2020-11-08: 0 mg via INTRAVENOUS
  Administered 2020-11-08: 295 mg via INTRAVENOUS
  Filled 2020-11-08: qty 29.5

## 2020-11-08 NOTE — Nurses Notes (Signed)
Patient discharged home with family.  AVS reviewed with patient/care giver.  A written copy of the AVS and discharge instructions was given to the patient/care giver.  Questions sufficiently answered as needed.  Patient/care giver encouraged to follow up with PCP as indicated.  In the event of an emergency, patient/care giver instructed to call 911 or go to the nearest emergency room.

## 2020-11-08 NOTE — Discharge Instructions (Signed)
Patient Education     Pemetrexed Disodium Solution for injection  What is this medicine?  PEMETREXED (PEM e TREX ed) is a chemotherapy drug. This medicine affects cells that are rapidly growing, such as cancer cells and cells in your mouth and stomach. It is usually used to treat lung cancers like non-small cell lung cancer and mesothelioma. It may also be used to treat other cancers.  This medicine may be used for other purposes; ask your health care provider or pharmacist if you have questions.  What should I tell my health care provider before I take this medicine?  They need to know if you have any of these conditions:   if you frequently drink alcohol containing beverages   infection (especially a virus infection such as chickenpox, cold sores, or herpes)   kidney disease   liver disease   low blood counts, like low platelets, red bloods, or white blood cells   an unusual or allergic reaction to pemetrexed, mannitol, other medicines, foods, dyes, or preservatives   pregnant or trying to get pregnant   breast-feeding  How should I use this medicine?  This drug is given as an infusion into a vein. It is administered in a hospital or clinic by a specially trained health care professional.  Talk to your pediatrician regarding the use of this medicine in children. Special care may be needed.  Overdosage: If you think you have taken too much of this medicine contact a poison control center or emergency room at once.  NOTE: This medicine is only for you. Do not share this medicine with others.  What if I miss a dose?  It is important not to miss your dose. Call your doctor or health care professional if you are unable to keep an appointment.  What may interact with this medicine?   aspirin and aspirin-like medicines   medicines to increase blood counts like filgrastim, pegfilgrastim, sargramostim   methotrexate   NSAIDS, medicines for pain and inflammation, like ibuprofen or  naproxen   probenecid   pyrimethamine   vaccines  Talk to your doctor or health care professional before taking any of these medicines:   acetaminophen   aspirin   ibuprofen   ketoprofen   naproxen  This list may not describe all possible interactions. Give your health care provider a list of all the medicines, herbs, non-prescription drugs, or dietary supplements you use. Also tell them if you smoke, drink alcohol, or use illegal drugs. Some items may interact with your medicine.  What should I watch for while using this medicine?  Visit your doctor for checks on your progress. This drug may make you feel generally unwell. This is not uncommon, as chemotherapy can affect healthy cells as well as cancer cells. Report any side effects. Continue your course of treatment even though you feel ill unless your doctor tells you to stop.  In some cases, you may be given additional medicines to help with side effects. Follow all directions for their use.  Call your doctor or health care professional for advice if you get a fever, chills or sore throat, or other symptoms of a cold or flu. Do not treat yourself. This drug decreases your body's ability to fight infections. Try to avoid being around people who are sick.  This medicine may increase your risk to bruise or bleed. Call your doctor or health care professional if you notice any unusual bleeding.  Be careful brushing and flossing your teeth or using  a toothpick because you may get an infection or bleed more easily. If you have any dental work done, tell your dentist you are receiving this medicine.  Avoid taking products that contain aspirin, acetaminophen, ibuprofen, naproxen, or ketoprofen unless instructed by your doctor. These medicines may hide a fever.  Call your doctor or health care professional if you get diarrhea or mouth sores. Do not treat yourself.  To protect your kidneys, drink water or other fluids as directed while you are taking this  medicine.  Men and women must use effective birth control while taking this medicine. You may also need to continue using effective birth control for a time after stopping this medicine. Do not become pregnant while taking this medicine. Tell your doctor right away if you think that you or your partner might be pregnant. There is a potential for serious side effects to an unborn child. Talk to your health care professional or pharmacist for more information. Do not breast-feed an infant while taking this medicine. This medicine may lower sperm counts.  What side effects may I notice from receiving this medicine?  Side effects that you should report to your doctor or health care professional as soon as possible:   allergic reactions like skin rash, itching or hives, swelling of the face, lips, or tongue   low blood counts - this medicine may decrease the number of white blood cells, red blood cells and platelets. You may be at increased risk for infections and bleeding.   signs of infection - fever or chills, cough, sore throat, pain or difficulty passing urine   signs of decreased platelets or bleeding - bruising, pinpoint red spots on the skin, black, tarry stools, blood in the urine   signs of decreased red blood cells - unusually weak or tired, fainting spells, lightheadedness   breathing problems, like a dry cough   changes in emotions or moods   chest pain   confusion   diarrhea   high blood pressure   mouth or throat sores or ulcers   pain, swelling, warmth in the leg   pain on swallowing   swelling of the ankles, feet, hands   trouble passing urine or change in the amount of urine   vomiting   yellowing of the eyes or skin  Side effects that usually do not require medical attention (report to your doctor or health care professional if they continue or are bothersome):   hair loss   loss of appetite   nausea   stomach upset  This list may not describe all possible side effects. Call your  doctor for medical advice about side effects. You may report side effects to FDA at 1-800-FDA-1088.  Where should I keep my medicine?  This drug is given in a hospital or clinic and will not be stored at home.  NOTE:This sheet is a summary. It may not cover all possible information. If you have questions about this medicine, talk to your doctor, pharmacist, or health care provider. Copyright 2018 [employer]

## 2020-11-13 ENCOUNTER — Other Ambulatory Visit (HOSPITAL_COMMUNITY)
Admission: RE | Admit: 2020-11-13 | Discharge: 2020-11-13 | Disposition: A | Discharge status: Home / Self Care | Payer: Medicare Other | Source: Ambulatory Visit | Attending: Gastroenterology | Admitting: Gastroenterology

## 2020-11-13 DIAGNOSIS — U071 COVID-19: Secondary | ICD-10-CM | POA: Insufficient documentation

## 2020-11-13 DIAGNOSIS — Z01812 Encounter for preprocedural laboratory examination: Secondary | ICD-10-CM | POA: Insufficient documentation

## 2020-11-13 LAB — SARS CORONAVIRUS 2 (TAT 6-24 HRS): SARS Coronavirus 2: POSITIVE — AB

## 2020-11-14 ENCOUNTER — Telehealth (HOSPITAL_COMMUNITY): Payer: Self-pay

## 2020-11-14 NOTE — Telephone Encounter (Signed)
[id number] - Contacted surgeon's office - spoke to [place] - advised of patient's Positive Covid19 lab results. MBM

## 2020-11-16 ENCOUNTER — Ambulatory Visit: Payer: Medicare Other

## 2020-11-20 ENCOUNTER — Other Ambulatory Visit (INDEPENDENT_AMBULATORY_CARE_PROVIDER_SITE_OTHER): Payer: Self-pay | Admitting: Family

## 2020-11-20 ENCOUNTER — Other Ambulatory Visit (HOSPITAL_COMMUNITY): Payer: Self-pay | Admitting: Hematology & Oncology

## 2020-11-20 DIAGNOSIS — R630 Anorexia: Secondary | ICD-10-CM

## 2020-11-21 NOTE — Progress Notes (Signed)
Subjective:    Patient ID: Melody Hendricks is a 65 y.o. female.    Consultation for  Non-small cell lung cancer, right  (primary encounter diagnosis)  Bone metastasis  Loss of appetite  Pressure injury of right buttock, stage 1  Atopic dermatitis, unspecified type  Earlobe lesion, right  Drug-induced constipation  Generalized weakness  Tachycardia     Verbal consent has been obtained from the patient to conduct a video and telephone: yes  Telemedicine Documentation Requirements  Originating site (Patient location): home  Distant site (Provider location): [facility] [place]  Provider and Title: Marya Landry, FNP  Consent obtained: YES/NO: Yes  Language, if applicable and if translator was required: [place]    Tele conference lasted 20 minutes     Patient reports is currently under the care of  Dr. Charlette Caffey she was able to restart treatment with pemetrexed in May.  States having infusion every 3 weeks. States her appetite has imporved and she was able to gain.    Patient still has an open area on her right buttock at stage I.  It is without drainage or erythema.  Patient's daughter-in-law is helping to provide wound care with Medihoney dressings with good results.  Patient was able to show me the ulceration today with the help of her husband.     Her atopic dermatitis has completely resolved with steroids and antifungal treatment.    Patient patient is currently ambulating with the aid of a cane, walker and wheelchair.  Patient reports that physical therapy has completed. She able to go up and down stairs with help     The lesion on her right earlobe has improved with steroid cream.    Patient reports that she feels that she has her constipation under better control with taking MiraLAX daily.  She has not had any more issues with fecal impaction.      Patient's heart rate is back under 100 with restarting Bystolic 5 mg..  Patient denies any chest pain or heart palpitations.        The  following portions of the patient's history were reviewed and updated as appropriate: allergies, current medications, past family history, past medical history, past social history, past surgical history and problem list.    Review of Systems   Constitutional: Negative for activity change, appetite change, chills, fatigue and fever.        Frail    HENT: Negative for sinus pressure and sinus pain.    Eyes: Negative for visual disturbance.   Respiratory: Negative for chest tightness and shortness of breath.    Cardiovascular: Negative for chest pain.        Tachycardia   Gastrointestinal: Negative for abdominal pain, constipation, diarrhea, nausea and vomiting.   Endocrine: Negative for polydipsia and polyphagia.   Genitourinary: Negative for difficulty urinating.   Musculoskeletal: Positive for gait problem (walking with a walker during tele health). Negative for arthralgias.   Skin: Negative for rash.        Small ulceration on right buttock, reported   Neurological: Negative for headaches.   Psychiatric/Behavioral: Negative for suicidal ideas.   All other systems reviewed and are negative.          Objective:    Physical Exam  Vitals reviewed.   Constitutional:       Comments: Frail, older than stated age No dentures in during visit   HENT:      Head: Normocephalic.  Nose: Nose normal.      Mouth/Throat:      Mouth: Mucous membranes are moist.      Comments: Not wearing dentures  Eyes:      Extraocular Movements: Extraocular movements intact.      Conjunctiva/sclera: Conjunctivae normal.      Pupils: Pupils are equal, round, and reactive to light.   Pulmonary:      Effort: Pulmonary effort is normal. No respiratory distress.   Genitourinary:         Comments: Shallow ulceration noted on right buttock, surround ing tissue appears normal. No discharge noted  Neurological:      Mental Status: She is alert and oriented to person, place, and time.   Psychiatric:         Mood and Affect: Mood normal.          Behavior: Behavior normal.         Thought Content: Thought content normal.         Judgment: Judgment normal.             Assessment:       1. Non-small cell lung cancer, right    2. Bone metastasis    3. Loss of appetite    4. Pressure injury of right buttock, stage 1    5. Atopic dermatitis, unspecified type    6. Earlobe lesion, right    7. Drug-induced constipation    8. Tachycardia    9. Pelvic floor weakness in female    10. Wheelchair bound    11. Generalized weakness          Plan:       1-2 Continue treatment per Dr. Lorrin Jackson, oncology.  Continue chemotherapy if approved by oncology  3. Improved. Continue Megace  4.  Improving.  Continue metahoney applications daily with her regular position changes  5.  Resolved with steroid and antifungal cream  6.  Resolved with steroid cream  7.  Continue MiraLAX and stool softeners.  Increase fiber in diet  8.  Continue Bystolic 5 mg for tachycardia  10-11.  Patient is encouraged to continue self-guided physical therapy.  Fall prevention was discussed.  She is urged to use her aids ambulating when needed    Follow-up in 3 months  via telehealth and as needed

## 2020-11-22 ENCOUNTER — Encounter (INDEPENDENT_AMBULATORY_CARE_PROVIDER_SITE_OTHER): Payer: Self-pay | Admitting: Family

## 2020-11-22 ENCOUNTER — Telehealth (INDEPENDENT_AMBULATORY_CARE_PROVIDER_SITE_OTHER): Payer: Medicare Other | Admitting: Family

## 2020-11-22 DIAGNOSIS — R630 Anorexia: Secondary | ICD-10-CM

## 2020-11-22 DIAGNOSIS — H6191 Disorder of right external ear, unspecified: Secondary | ICD-10-CM

## 2020-11-22 DIAGNOSIS — K5903 Drug induced constipation: Secondary | ICD-10-CM

## 2020-11-22 DIAGNOSIS — R000 Tachycardia, unspecified: Secondary | ICD-10-CM

## 2020-11-22 DIAGNOSIS — L209 Atopic dermatitis, unspecified: Secondary | ICD-10-CM

## 2020-11-22 DIAGNOSIS — R531 Weakness: Secondary | ICD-10-CM

## 2020-11-22 DIAGNOSIS — Z993 Dependence on wheelchair: Secondary | ICD-10-CM

## 2020-11-22 DIAGNOSIS — C7951 Secondary malignant neoplasm of bone: Secondary | ICD-10-CM

## 2020-11-22 DIAGNOSIS — N8189 Other female genital prolapse: Secondary | ICD-10-CM

## 2020-11-22 DIAGNOSIS — L89311 Pressure ulcer of right buttock, stage 1: Secondary | ICD-10-CM

## 2020-11-22 DIAGNOSIS — C3491 Malignant neoplasm of unspecified part of right bronchus or lung: Secondary | ICD-10-CM

## 2020-11-22 MED ORDER — NIFEDIPINE ER 30 MG PO TB24
30.0000 mg | ORAL_TABLET | Freq: Every day | ORAL | 3 refills | Status: DC
Start: ? — End: 2020-11-22

## 2020-11-22 NOTE — Progress Notes (Signed)
LVM to rsch 3 mo f/u

## 2020-11-28 ENCOUNTER — Ambulatory Visit: Payer: Medicare Other | Attending: Hematology & Oncology | Admitting: Hematology & Oncology

## 2020-11-28 ENCOUNTER — Encounter (HOSPITAL_COMMUNITY): Payer: Self-pay | Admitting: Hematology & Oncology

## 2020-11-28 ENCOUNTER — Other Ambulatory Visit: Payer: Self-pay

## 2020-11-28 VITALS — BP 116/58 | HR 101 | Temp 98.2°F | Resp 24 | Ht 60.98 in | Wt 93.2 lb

## 2020-11-28 DIAGNOSIS — C3411 Malignant neoplasm of upper lobe, right bronchus or lung: Secondary | ICD-10-CM | POA: Insufficient documentation

## 2020-11-28 DIAGNOSIS — C3491 Malignant neoplasm of unspecified part of right bronchus or lung: Secondary | ICD-10-CM

## 2020-11-28 DIAGNOSIS — C801 Malignant (primary) neoplasm, unspecified: Secondary | ICD-10-CM

## 2020-11-28 DIAGNOSIS — C7951 Secondary malignant neoplasm of bone: Secondary | ICD-10-CM | POA: Insufficient documentation

## 2020-11-28 DIAGNOSIS — E039 Hypothyroidism, unspecified: Secondary | ICD-10-CM | POA: Insufficient documentation

## 2020-11-28 DIAGNOSIS — Z87891 Personal history of nicotine dependence: Secondary | ICD-10-CM | POA: Insufficient documentation

## 2020-11-28 DIAGNOSIS — G893 Neoplasm related pain (acute) (chronic): Secondary | ICD-10-CM

## 2020-11-28 MED ORDER — OXYCODONE-ACETAMINOPHEN 10 MG-325 MG TABLET
1.0000 | ORAL_TABLET | Freq: Four times a day (QID) | ORAL | 0 refills | Status: DC | PRN
Start: 2020-11-28 — End: 2021-02-04

## 2020-11-28 NOTE — Progress Notes (Unsigned)
Return Patient Progress Note    Date: 11/28/2020    Name: Gina Barrett  MRN: [id number]  DOB: [birth year]   Referring Physician: Self, Referral  Primary Care Provider: Lanier Ensign, FNP-C    Reason for visit/consultation Lung Cancer (Non-small cell lung cancer, right )      Interval History:   65 y/o woman with metastatic recurrence of disease initially diagnosed as stage III and treated as such.  She has apparently been making physical and dietary progress per husband and daughter inlaw.  Husband brough food diary which I reviewed.  Her pain is controlled. However, remmains    ROS:     Review of Systems   Constitutional: Negative.    HENT:   Negative for trouble swallowing.    Respiratory: Negative.    Cardiovascular: Negative.    Gastrointestinal: Negative.    Genitourinary: Negative for pelvic pain.    Hematological: Negative for adenopathy.      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  -PET-CT scan on 09/06/2016 showed 10 mm right upper lobe FDG avid lesion that is slightly larger than 2017 CT and concerning for malignancy.  A separate 3 cm long area of tracer uptake in the left lower lobe was noted.  -CT-guided biopsy a [facility] on 10/02/2016 was positive for adenocarcinoma that is well to moderately differentiated.  -Mediastinoscopy 11/13/2016 positive for left mid paratrachael    Stage: IV, initially diagnosed as IIIB     Molecular/special studies:  ROS-1, ALK-1, EGFR, PDL1 all negative    Treatment & Surveillance:   1. S/p chemoradiation with carboplatin + paclitaxel + RT 66.6 Gy  completed 02/10/19/18    2. Serial imaging since completion of therapy has been negative for recurrence.  Most recent imaging in on 12/03/2018 was also negative but showed a new T6 compression abnormality.     3. Chest CT with contrast from 09/23/2019 which in summary showed increasing pleural based density in the LLL which may be infectious or inflammatory.  There is increased 1.7cm LN in left hilum ans stable compression abnormality of T6.    4. Biopsy of left LL pleural bases mass on 11/02/2019 showed benign fibrovascular tissue with focal chronic inflammation.  no lung parechyma or malgnancy identified.    5. Patient developed progressive musculoskeletal pain around October /November of 2021 evaluation with multiple imaging studies highly suggest metastatic disease.  Evaluation for plasma cell was noncontributory.  Patient was referred to orthopedic surgery (Dr. Hervey Ard, by family request and known to patient)  for surgical recommendation especially regarding weight-bearing.    6. Biopsy of paraspinal soft tissue mass on 07/04/2020 confirmed metastatic carcinoma consistent with lung primary. CARIS NGS negative    7.Radiation Oncology for radiation to the acetabulum as well as the paraspinal mass started 07/19/2020 x 10 , ending 07/31/2020    8. Mediport placed  07/19/2020    9.Begin Zoledronic acid.  Has dentures and no dental eval needed    10. Brain MRI planne2/12/2020     11.  Begin palliative carboplatin & pemetrexed on 08/03/2020       ________________________________________________________________________________________________________________________________________________     Objective   Objective:   BP (!) 116/58 (Site: Left, Patient  Position: Sitting)   Pulse (!) 101   Temp 36.8 C (98.2 F) (Thermal Scan)   Resp (!) 24   Ht 1.549 m (5' 0.98") Comment: *  Wt 42.3 kg (93 lb 4 oz)   SpO2 100%   BMI 17.63 kg/m       ECOG Status: 2 - Ambulatory and capable of all selfcare, but unable to carry out any work  activities.  Up and about more than 50% of waking hours.    Physical Exam  Vitals reviewed.   Constitutional:       Comments: Remains frail and week but improved from prior visit   Eyes:      General: No scleral icterus.  Cardiovascular:      Rate and Rhythm: Regular rhythm.      Pulses: Normal pulses.   Pulmonary:      Breath sounds: Normal breath sounds.   Abdominal:      Palpations: Abdomen is soft.      Tenderness: There is no abdominal tenderness.   Musculoskeletal:      Right lower leg: No edema.   Skin:     Findings: No bruising.   Neurological:      Mental Status: Mental status is at baseline.          Past Medical History:   Diagnosis Date   . Abnormal Pap smear     ASCUS, ASCUS cannot rule out HGSIL   . Cancer (CMS HCC)     non small cell lung cancer (R)-s/p Chemo/Radiation completed 8/18   . Colon polyp    . Dysphagia    . Hypothyroid    . Non-small cell lung cancer, right (CMS [facility])     s/p Chemo completed 9/18   . Raynaud's disease    . STD (sexually transmitted disease)     HSV   . Unspecified disorder of thyroid     Hypothyroidism         Current Outpatient Medications   Medication Sig   . Artifi. Tear, Hypromellose,-PF (RETAINE [facility], PF,) 0.3 % Ophthalmic Drops Administer into affected eye(s) Once per day as needed   . azithromycin (ZITHROMAX) 250 mg Oral Tablet Take 500 mg (2 tab) on day 1; take 250 mg (1 tab) on days 2-5.   . carboxymethyl/glycerin/poly80 (REFRESH OPTIVE ADVANCED OPHT) Apply to eye Once per day as needed   . carboxymethylcellulose Sodium (THERATEARS) 0.25 % Ophthalmic Drops 1-2 Drops Once per day as needed   . folic acid (FOLVITE) 1 mg Oral Tablet TAKE ONE TABLET BY MOUTH EVERY DAY   . levothyroxine (SYNTHROID) 88 mcg Oral Tablet TAKE ONE TABLET BY MOUTH EVERY DAY   . loratadine (CLARITIN) 10 mg Oral Tablet take 10 mg by mouth Once a day.   Marland Kitchen MEDIHONEY, HONEY, 80 % Gel Twice daily to affected area(s)   . megestroL (MEGACE) 40 mg Oral Tablet Take 40 mg by mouth   . mirtazapine  (REMERON) 15 mg Oral Tablet Take 1 Tablet (15 mg total) by mouth Every night   . MULTIVITAMIN ORAL Take by mouth Once a day Centrum Silver Women's 50+   . nebivoloL (BYSTOLIC) 5 mg Oral Tablet    . Nifedipine (ADALAT CC) 30 mg Oral Tablet Sustained Release take 30 mg by mouth Once a day.   . ondansetron (ZOFRAN ODT) 8 mg Oral Tablet, Rapid Dissolve Place 1 Tablet (8 mg total) under the tongue Every 8 hours as needed for Nausea/Vomiting Indications: nausea and vomiting caused  by cancer drugs   . oxyCODONE-acetaminophen (PERCOCET) 10-325 mg Oral Tablet Take 1 Tablet by mouth Every 6 hours as needed for Pain for up to 30 days   . polyethylene glycol (MIRALAX) 17 gram Oral Powder in Packet Take 1 Packet (17 g total) by mouth Once a day   . prochlorperazine (COMPAZINE) 10 mg Oral Tablet Take 1 Tablet (10 mg total) by mouth Four times a day as needed for Nausea/Vomiting   . sennosides-docusate sodium (SENOKOT-S) 8.6-50 mg Oral Tablet Take 1 Tablet by mouth Every evening   . tiotropium-olodateroL (STIOLTO RESPIMAT) 2.5-2.5 mcg/actuation Inhalation Mist Take 2 INHALATION by inhalation Once a day   . UNKNOWN MEDICATION (UNKNOWN MEDICATION) Administer into affected eye(s) [name] 128 Eye Drops OTC 4x daily  [name] 128 Eye Gel OTC Nightly     Allergies as of 11/28/2020 - Reviewed 11/28/2020   Allergen Reaction Noted   . Penicillins  08/15/2010       Social History  Occupation:   Social History     Occupational History     Employer: [employer]: [place] [id number]   reports that she quit smoking about 22 years ago. Her smoking use included cigarettes. She started smoking about 50 years ago. She quit after 20.00 years of use. She has never used smokeless tobacco. She reports being sexually active and has had partner(s) who are female. She reports using the following method of birth control/protection: None. She reports that she does not drink alcohol and does not use drugs.    Labs:  Labs reviewed and  interpreted:  No results found for this or any previous visit (from the past 96 hour(s)).    Radiology: All recent pertinent radiologic images and reports reviewed          Assessment/Plan:     1. Non-small cell lung cancer, right (CMS [facility])  65 y/o woman with metastatic NSCLC who is s/p 4 cycles of dose reduced carboplatin and pemetrexed.   She tolerated last cycles well with labs today that is within parameters to proceed with treatment.  Will repeat CT c/a/p after this cycle.  - Chemotherapy orders were reviewed, dosage adjustment were made in accordance with labs and toxicity, orders were signed and therapy was coordinated and supervised with the infusion center.    - CT CHEST ABDOMEN PELVIS W IV CONTRAST; Future  - oxyCODONE-acetaminophen (PERCOCET) 10-325 mg Oral Tablet; Take 1 Tablet by mouth Every 6 hours as needed for Pain for up to 30 days  Dispense: 120 Tablet; Refill: 0    2. 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  She unfortunately remains physically limited and wheelchair reliant.    - CT CHEST ABDOMEN PELVIS W IV CONTRAST; Future  - oxyCODONE-acetaminophen (PERCOCET) 10-325 mg Oral Tablet; Take 1 Tablet by mouth Every 6 hours as needed for Pain for up to 30 days  Dispense: 120 Tablet; Refill: 0    3. Cancer associated pain  - Controlled and refill provided.  - oxyCODONE-acetaminophen (PERCOCET) 10-325 mg Oral Tablet; Take 1 Tablet by mouth Every 6 hours as needed for Pain for up to 30 days  Dispense: 120 Tablet; Refill: 0      Return in about 3 weeks (around 12/19/2020).    Mertie Moores, MD        CC:  PCP General:  Lanier Ensign, FNP-C  [address]  [place] [id number]      Portions of this note may be dictated using voice recognition software or a dictation service. Variances in spelling and vocabulary are possible and unintentional. Not all errors are caught/corrected. Please notify the [facility]  if any discrepancies are noted or if the meaning of any statement is not clear.

## 2020-11-29 ENCOUNTER — Inpatient Hospital Stay
Admission: RE | Admit: 2020-11-29 | Discharge: 2020-11-29 | Disposition: A | Discharge status: Still In Hospital | Payer: Medicare Other | Source: Ambulatory Visit | Attending: Hematology & Oncology | Admitting: Hematology & Oncology

## 2020-11-29 ENCOUNTER — Other Ambulatory Visit: Payer: Self-pay

## 2020-11-29 VITALS — BP 107/66 | HR 99 | Temp 97.7°F | Resp 24 | Ht 60.98 in | Wt 93.4 lb

## 2020-11-29 DIAGNOSIS — C7951 Secondary malignant neoplasm of bone: Secondary | ICD-10-CM

## 2020-11-29 DIAGNOSIS — C3490 Malignant neoplasm of unspecified part of unspecified bronchus or lung: Secondary | ICD-10-CM

## 2020-11-29 DIAGNOSIS — C3491 Malignant neoplasm of unspecified part of right bronchus or lung: Secondary | ICD-10-CM

## 2020-11-29 DIAGNOSIS — C801 Malignant (primary) neoplasm, unspecified: Secondary | ICD-10-CM

## 2020-11-29 LAB — CBC WITH DIFF
BASOPHIL #: <0.1 10*3/uL (ref <=0.20)
BASOPHIL %: 1 %
EOSINOPHIL #: <0.1 10*3/uL (ref <=0.50)
EOSINOPHIL %: 1 %
HCT: 27.6 % — ABNORMAL LOW (ref 34.8–46.0)
HGB: 8.8 g/dL — ABNORMAL LOW (ref 11.5–16.0)
IMMATURE GRANULOCYTE #: <0.1 10*3/uL (ref <0.10)
IMMATURE GRANULOCYTE %: 2 % — ABNORMAL HIGH (ref 0–1)
LYMPHOCYTE #: 0.35 10*3/uL — ABNORMAL LOW (ref 1.00–4.80)
LYMPHOCYTE %: 14 %
MCH: 30.9 pg (ref 26.0–32.0)
MCHC: 31.9 g/dL (ref 31.0–35.5)
MCV: 96.8 fL (ref 78.0–100.0)
MONOCYTE #: 0.7 10*3/uL (ref 0.20–1.10)
MONOCYTE %: 27 %
MPV: 9.1 fL (ref 8.7–12.5)
NEUTROPHIL #: 1.45 10*3/uL — ABNORMAL LOW (ref 1.50–7.70)
NEUTROPHIL %: 55 %
PLATELETS: 267 10*3/uL (ref 150–400)
RBC: 2.85 10*6/uL — ABNORMAL LOW (ref 3.85–5.22)
RDW-CV: 15.7 % — ABNORMAL HIGH (ref 11.5–15.5)
WBC: 2.6 10*3/uL — ABNORMAL LOW (ref 3.7–11.0)

## 2020-11-29 LAB — COMPREHENSIVE METABOLIC PANEL, NON-FASTING
ALBUMIN: 3.4 g/dL (ref 3.4–4.8)
ALKALINE PHOSPHATASE: 72 U/L (ref 55–145)
ALT (SGPT): 16 U/L (ref 8–22)
ANION GAP: 7 mmol/L (ref 4–13)
AST (SGOT): 23 U/L (ref 8–45)
BILIRUBIN TOTAL: 0.1 mg/dL — ABNORMAL LOW (ref 0.3–1.3)
BUN/CREA RATIO: 18 (ref 6–22)
BUN: 14 mg/dL (ref 8–25)
CALCIUM: 8.8 mg/dL (ref 8.8–10.2)
CHLORIDE: 110 mmol/L (ref 96–111)
CO2 TOTAL: 24 mmol/L (ref 23–31)
CREATININE: 0.76 mg/dL (ref 0.60–1.05)
ESTIMATED GFR: 86 mL/min/BSA (ref >=60)
GLUCOSE: 98 mg/dL (ref 65–125)
POTASSIUM: 4.4 mmol/L (ref 3.5–5.1)
PROTEIN TOTAL: 6.8 g/dL (ref 6.0–8.0)
SODIUM: 141 mmol/L (ref 136–145)

## 2020-11-29 LAB — MAGNESIUM: MAGNESIUM: 1.8 mg/dL (ref 1.8–2.6)

## 2020-11-29 MED ORDER — ZOLEDRONIC ACID 4 MG/5 ML INTRAVENOUS SOLUTION
3.5000 mg | Freq: Once | INTRAVENOUS | Status: AC
Start: 2020-11-29 — End: 2020-11-29
  Administered 2020-11-29: 3.5 mg via INTRAVENOUS
  Administered 2020-11-29: 0 mg via INTRAVENOUS
  Filled 2020-11-29: qty 4.38

## 2020-11-29 MED ORDER — SODIUM CHLORIDE 0.9% FLUSH BAG - 250 ML
INTRAVENOUS | Status: DC | PRN
Start: 2020-11-29 — End: 2020-11-30

## 2020-11-29 MED ORDER — SODIUM CHLORIDE 0.9 % INTRAVENOUS SOLUTION
150.0000 mg | Freq: Once | INTRAVENOUS | Status: AC
Start: 2020-11-29 — End: 2020-11-29
  Administered 2020-11-29: 0 mg via INTRAVENOUS
  Administered 2020-11-29: 150 mg via INTRAVENOUS
  Administered 2020-11-29: 14:00:00 0 mg via INTRAVENOUS
  Filled 2020-11-29: qty 5

## 2020-11-29 MED ORDER — SODIUM CHLORIDE 0.9 % INTRAVENOUS SOLUTION
375.0000 mg/m2 | Freq: Once | INTRAVENOUS | Status: AC
Start: 2020-11-29 — End: 2020-11-29
  Administered 2020-11-29: 500 mg via INTRAVENOUS
  Administered 2020-11-29: 0 mg via INTRAVENOUS
  Filled 2020-11-29: qty 20

## 2020-11-29 MED ORDER — SODIUM CHLORIDE 0.9 % INTRAVENOUS SOLUTION
294.8000 mg | Freq: Once | INTRAVENOUS | Status: AC
Start: 2020-11-29 — End: 2020-11-29
  Administered 2020-11-29 (×2): 295 mg via INTRAVENOUS
  Administered 2020-11-29: 0 mg via INTRAVENOUS
  Filled 2020-11-29: qty 29.5

## 2020-11-29 MED ORDER — SODIUM CHLORIDE 0.9 % INTRAVENOUS SOLUTION
12.0000 mg | Freq: Once | INTRAVENOUS | Status: AC
Start: 2020-11-29 — End: 2020-11-29
  Administered 2020-11-29: 12 mg via INTRAVENOUS
  Administered 2020-11-29: 0 mg via INTRAVENOUS
  Filled 2020-11-29: qty 3

## 2020-11-29 MED ORDER — HEPARIN, PORCINE (PF) 100 UNIT/ML INTRAVENOUS SYRINGE
5.0000 mL | INJECTION | Freq: Once | INTRAVENOUS | Status: AC
Start: 2020-11-29 — End: 2020-11-29
  Administered 2020-11-29: 5 mL
  Filled 2020-11-29: qty 5

## 2020-11-29 MED ORDER — SODIUM CHLORIDE 0.9 % (FLUSH) INJECTION SYRINGE
10.0000 mL | INJECTION | Freq: Once | INTRAMUSCULAR | Status: AC
Start: 2020-11-29 — End: 2020-11-29
  Administered 2020-11-29: 10 mL

## 2020-11-29 MED ORDER — PALONOSETRON 0.25 MG/5 ML INTRAVENOUS SOLUTION
0.2500 mg | Freq: Once | INTRAVENOUS | Status: AC
Start: 2020-11-29 — End: 2020-11-29
  Administered 2020-11-29: 0.25 mg via INTRAVENOUS
  Filled 2020-11-29: qty 5

## 2020-11-29 NOTE — Nurses Notes (Signed)
Patient discharged home with family.  AVS reviewed with patient/care giver.  A written copy of the AVS and discharge instructions was given to the patient/care giver.  Questions sufficiently answered as needed.  Patient/care giver encouraged to follow up with PCP as indicated.  In the event of an emergency, patient/care giver instructed to call 911 or go to the nearest emergency room.

## 2020-11-29 NOTE — Discharge Instructions (Signed)
Patient Education     Nutrition During Chemotherapy     Drink plenty of liquids, such as water.     During chemotherapy, the energy you get froma healthy diet can help you rebuild normal cells. It can also help you keep up your strength and fight infection. As a result, you may feel better and be more able to cope with side effects. Ask your healthcare provider about your nutrition needs.  Drink plenty of fluids   Fluids help the body produce urine and decrease constipation. They help prevent kidney and bladder problems. They also help replace fluids lost from vomiting and diarrhea.   Try water, unsweetened juices, and other flavored drinks without caffeine. They flush toxins from the body.  Get enough calories   Calories are fuel. The body uses this fuel to perform all of its functions, including healing.   It'sOK to be lean, but be sure you are not underweight. If you are, try eating more calories.   Eat calorie-dense foods such as avocados, peanut butter, eggs, and ice cream.   If you need extra calories, add butter, gravy, and sauces to foods (if tolerated).   If you don't need the extra calories, try to limit foods that are fried, greasy, or high in fat or added sugar.  Eat protein, fruits, and vegetables   Protein builds muscle, bone, skin, and blood. It helps your body heal and fight infection. It also helps boost your energy level.   Good protein choices include yogurt, eggs, chicken, lean meats, beans, and peanut butter.   Fruits and vegetables are full of important vitamins, minerals, and fiberto help your body function properly.   Try to eat a variety of vegetables, fruits, whole grains, and beans.   Ask your healthcare provider about instant protein powder or other supplements.  Eating right during treatment  Side effects may make it a little harder to eat well on some days. To help you continue to get the nutrition you need:   Be open to new foods and recipes.   Eat small portions often  and slowly.   Have a healthy snack instead of a meal if you are not very hungry.   Try eating in a new setting.   Do physical activity, such as walking, to help increase your appetite. Try to be active for at least30 minutes each day.   Boostyour diet by getting the vitamins and minerals you need from fruits, vegetables, and whole grains.   If you live alone and are not up to cooking, ask your healthcare provider about Meals on Wheels or other outreach programs.   Sometimes, it is best to follow your appetite. Eat when you are hungry, but when you are not, forcing yourself to eat can make you feel bad, nauseated, or even cause you to vomit.  [employer] last reviewed this educational content on 11/14/2016   2000-2021 [employer]. All rights reserved. This information is not intended as a substitute for professional medical care. Always follow your healthcare professional's instructions.

## 2020-11-29 NOTE — Nurses Notes (Signed)
Corrected calcium is 9.0.  Creatinine clearance is 50.72.

## 2020-11-30 ENCOUNTER — Other Ambulatory Visit (INDEPENDENT_AMBULATORY_CARE_PROVIDER_SITE_OTHER): Payer: Self-pay | Admitting: Family

## 2020-11-30 ENCOUNTER — Encounter (HOSPITAL_COMMUNITY): Payer: Self-pay | Admitting: Hematology & Oncology

## 2020-12-01 ENCOUNTER — Other Ambulatory Visit (HOSPITAL_COMMUNITY): Payer: Self-pay | Admitting: HEMATOLOGY-ONCOLOGY

## 2020-12-01 MED ORDER — AZITHROMYCIN 250 MG TABLET
ORAL_TABLET | ORAL | 0 refills | Status: DC
Start: 2020-12-01 — End: 2020-12-18

## 2020-12-03 ENCOUNTER — Other Ambulatory Visit (HOSPITAL_COMMUNITY): Payer: Self-pay | Admitting: Hematology & Oncology

## 2020-12-05 ENCOUNTER — Encounter (HOSPITAL_COMMUNITY): Payer: Self-pay | Admitting: Internal Medicine

## 2020-12-05 ENCOUNTER — Other Ambulatory Visit: Payer: Self-pay

## 2020-12-05 ENCOUNTER — Ambulatory Visit (INDEPENDENT_AMBULATORY_CARE_PROVIDER_SITE_OTHER): Payer: Medicare Other | Admitting: Internal Medicine

## 2020-12-05 VITALS — BP 122/65 | Temp 97.1°F | Resp 14 | Ht 60.98 in | Wt 93.0 lb

## 2020-12-05 DIAGNOSIS — J449 Chronic obstructive pulmonary disease, unspecified: Secondary | ICD-10-CM

## 2020-12-05 DIAGNOSIS — C3490 Malignant neoplasm of unspecified part of unspecified bronchus or lung: Secondary | ICD-10-CM

## 2020-12-05 DIAGNOSIS — J438 Other emphysema: Secondary | ICD-10-CM

## 2020-12-05 MED ORDER — STIOLTO RESPIMAT 2.5 MCG-2.5 MCG/ACTUATION SOLUTION FOR INHALATION
2.0000 | Freq: Every day | RESPIRATORY_TRACT | 5 refills | Status: DC
Start: 2020-12-05 — End: 2021-03-06

## 2020-12-05 NOTE — Progress Notes (Signed)
Subjective:     Patient ID:  Gina Barrett is an 65 y.o. female     Chief Complaint:    Chief Complaint   Patient presents with   . Follow Up   . COPD   . Lung Cancer       HPI   She is here for a regular follow-up for her COPD.    She was diagnosed with lung cancer and is undergoing chemotherapy followed by Dr. Holley Dexter.    She had recent bronchitis and was given a Z-Pak and started Mucinex which she has found helpful.    Mucus his decreased and her cough has improved.  She has not had any increased shortness of breath with this.    Her appetite did decrease but is now improved since being started on Megace.    She is on Stiolto and feels like it helps she has no rescue inhaler use.      Past Medical History  Current Outpatient Medications   Medication Sig   . Artifi. Tear, Hypromellose,-PF (RETAINE [facility], PF,) 0.3 % Ophthalmic Drops Administer into affected eye(s) Once per day as needed   . azithromycin (ZITHROMAX) 250 mg Oral Tablet Take 500 mg (2 tab) on day 1; take 250 mg (1 tab) on days 2-5.   . carboxymethyl/glycerin/poly80 (REFRESH OPTIVE ADVANCED OPHT) Apply to eye Once per day as needed   . carboxymethylcellulose Sodium (THERATEARS) 0.25 % Ophthalmic Drops 1-2 Drops Once per day as needed   . folic acid (FOLVITE) 1 mg Oral Tablet TAKE ONE TABLET BY MOUTH EVERY DAY   . levothyroxine (SYNTHROID) 88 mcg Oral Tablet TAKE ONE TABLET BY MOUTH EVERY DAY   . loratadine (CLARITIN) 10 mg Oral Tablet take 10 mg by mouth Once a day.   Marland Kitchen MEDIHONEY, HONEY, 80 % Gel Twice daily to affected area(s)   . megestroL (MEGACE) 40 mg Oral Tablet Take 40 mg by mouth   . mirtazapine (REMERON) 15 mg Oral Tablet Take 1 Tablet (15 mg total) by mouth Every night   . MULTIVITAMIN ORAL Take by mouth Once a day Centrum Silver Women's 50+   . nebivoloL (BYSTOLIC) 5 mg Oral Tablet    . Nifedipine (ADALAT CC) 30 mg Oral Tablet Sustained Release take 30 mg by mouth Once a day.   . ondansetron (ZOFRAN ODT) 8 mg Oral Tablet, Rapid Dissolve  Place 1 Tablet (8 mg total) under the tongue Every 8 hours as needed for Nausea/Vomiting Indications: nausea and vomiting caused by cancer drugs   . oxyCODONE-acetaminophen (PERCOCET) 10-325 mg Oral Tablet Take 1 Tablet by mouth Every 6 hours as needed for Pain for up to 30 days   . polyethylene glycol (MIRALAX) 17 gram Oral Powder in Packet Take 1 Packet (17 g total) by mouth Once a day   . prochlorperazine (COMPAZINE) 10 mg Oral Tablet Take 1 Tablet (10 mg total) by mouth Four times a day as needed for Nausea/Vomiting   . sennosides-docusate sodium (SENOKOT-S) 8.6-50 mg Oral Tablet Take 1 Tablet by mouth Every evening   . tiotropium-olodateroL (STIOLTO RESPIMAT) 2.5-2.5 mcg/actuation Inhalation Mist Take 2 INHALATION by inhalation Once a day (Patient taking differently: Take 2 INHALATION by inhalation Once a day inhaler)   . UNKNOWN MEDICATION (UNKNOWN MEDICATION) Administer into affected eye(s) [name] 128 Eye Drops OTC 4x daily  [name] 128 Eye Gel OTC Nightly     Allergies   Allergen Reactions   . Penicillins      Past Medical History:  Diagnosis Date   . Abnormal Pap smear     ASCUS, ASCUS cannot rule out HGSIL   . Cancer (CMS HCC)     non small cell lung cancer (R)-s/p Chemo/Radiation completed 8/18   . Colon polyp    . Dysphagia    . Hypothyroid    . Non-small cell lung cancer, right (CMS [place])     s/p Chemo completed 9/18   . Raynaud's disease    . STD (sexually transmitted disease)     HSV   . Unspecified disorder of thyroid     Hypothyroidism         Past Surgical History:   Procedure Laterality Date   . HX CATARACT REMOVAL Bilateral 2011   . HX COLONOSCOPY  2009   . HX ELBOW SURGERY     . HX FOOT SURGERY     . HX TUBAL LIGATION  1978   . LUNG BIOPSY Right 10/02/2016   . MEDIASTINOSCOPY  11/13/2016   . PB FOREARM/WRIST SURGERY UNLISTED  1999    carpal tunnel release   . PORTACATH PLACEMENT  12/12/2016         Family Medical History:     Problem Relation (Age of Onset)    Breast Cancer Mother    Diabetes  Paternal Grandfather, Brother    No Known Problems Father, Sister, Maternal Grandmother, Maternal Grandfather, Paternal 45, Daughter, Son, Maternal Aunt, Maternal Uncle, Paternal 51, Paternal Uncle, Other          Social History     Socioeconomic History   . Marital status: Married   Occupational History     Employer: [employer]   Tobacco Use   . Smoking status: Former Smoker     Years: 20.00     Types: Cigarettes     Start date: 12/09/1970     Quit date: 09/08/1998     Years since quitting: 22.2   . Smokeless tobacco: Never Used   Substance and Sexual Activity   . Alcohol use: No   . Drug use: No   . Sexual activity: Yes     Partners: Male     Birth control/protection: None   Other Topics Concern   . Breast Self Exam Yes   . Calcium intake adequate No   . Ability to Walk 1 Flight of Steps without SOB/CP Yes   . Ability To Do Own ADL's Yes     Review of Systems   Constitutional: Negative.    HENT: Negative.    Respiratory: Positive for cough.    Cardiovascular: Negative.    Musculoskeletal: Positive for back pain.   Neurological: Negative.    Psychiatric/Behavioral: Negative.    All other systems reviewed and are negative.      Objective:     Physical Exam  Constitutional:       Appearance: Normal appearance. She is ill-appearing.   HENT:      Head:      Comments: Wearing a mask  Cardiovascular:      Rate and Rhythm: Normal rate and regular rhythm.      Heart sounds: Normal heart sounds.   Pulmonary:      Effort: Pulmonary effort is normal.      Breath sounds: Normal breath sounds.      Comments: Slight decreased air entry bilaterally  Skin:     General: Skin is warm and dry.   Neurological:      General: No focal deficit present.  Mental Status: She is alert and oriented to person, place, and time.   Psychiatric:         Mood and Affect: Mood normal.         Behavior: Behavior normal.         Ortho Exam    Assessment & Plan:       ICD-10-CM    1. Chronic obstructive pulmonary disease,  unspecified COPD type (CMS HCC)  J44.9    2. Malignant neoplasm of lung, unspecified laterality, unspecified part of lung (CMS HCC)  C34.90      Moderate COPD doing well on dual therapy.    Non-small-cell lung cancer currently undergoing chemotherapy.    Plan.  Continue Stiolto this was refilled.  Albuterol as needed.  Call me if her cough does not fully resolved and I would consider a course of prednisone.  Return to clinic in 6 months or sooner if needed

## 2020-12-10 ENCOUNTER — Other Ambulatory Visit (HOSPITAL_COMMUNITY): Payer: Self-pay

## 2020-12-10 DIAGNOSIS — C3490 Malignant neoplasm of unspecified part of unspecified bronchus or lung: Secondary | ICD-10-CM

## 2020-12-17 ENCOUNTER — Encounter (HOSPITAL_COMMUNITY): Payer: Self-pay | Admitting: Hematology & Oncology

## 2020-12-18 ENCOUNTER — Ambulatory Visit (HOSPITAL_COMMUNITY)
Admission: RE | Admit: 2020-12-18 | Discharge: 2020-12-18 | Disposition: A | Discharge status: Home / Self Care | Payer: Medicare Other | Source: Ambulatory Visit | Attending: Hematology & Oncology | Admitting: Hematology & Oncology

## 2020-12-18 ENCOUNTER — Other Ambulatory Visit: Payer: Self-pay

## 2020-12-18 ENCOUNTER — Encounter (HOSPITAL_COMMUNITY): Payer: Self-pay | Admitting: Hematology & Oncology

## 2020-12-18 ENCOUNTER — Ambulatory Visit: Payer: Medicare Other | Attending: Hematology & Oncology | Admitting: Hematology & Oncology

## 2020-12-18 VITALS — BP 126/66 | HR 107 | Temp 98.2°F | Resp 24 | Ht 60.98 in | Wt 97.0 lb

## 2020-12-18 DIAGNOSIS — C7951 Secondary malignant neoplasm of bone: Secondary | ICD-10-CM | POA: Insufficient documentation

## 2020-12-18 DIAGNOSIS — G893 Neoplasm related pain (acute) (chronic): Secondary | ICD-10-CM | POA: Insufficient documentation

## 2020-12-18 DIAGNOSIS — C3490 Malignant neoplasm of unspecified part of unspecified bronchus or lung: Secondary | ICD-10-CM | POA: Insufficient documentation

## 2020-12-18 DIAGNOSIS — R630 Anorexia: Secondary | ICD-10-CM | POA: Insufficient documentation

## 2020-12-18 DIAGNOSIS — D701 Agranulocytosis secondary to cancer chemotherapy: Secondary | ICD-10-CM | POA: Insufficient documentation

## 2020-12-18 DIAGNOSIS — R5381 Other malaise: Secondary | ICD-10-CM

## 2020-12-18 DIAGNOSIS — D6481 Anemia due to antineoplastic chemotherapy: Secondary | ICD-10-CM

## 2020-12-18 DIAGNOSIS — T451X5A Adverse effect of antineoplastic and immunosuppressive drugs, initial encounter: Secondary | ICD-10-CM | POA: Insufficient documentation

## 2020-12-18 LAB — COMPREHENSIVE METABOLIC PANEL, NON-FASTING
ALBUMIN: 3.3 g/dL — ABNORMAL LOW (ref 3.4–4.8)
ALKALINE PHOSPHATASE: 79 U/L (ref 55–145)
ALT (SGPT): 26 U/L — ABNORMAL HIGH (ref 8–22)
ANION GAP: 7 mmol/L (ref 4–13)
AST (SGOT): 27 U/L (ref 8–45)
BILIRUBIN TOTAL: 0.2 mg/dL — ABNORMAL LOW (ref 0.3–1.3)
BUN/CREA RATIO: 17 (ref 6–22)
BUN: 13 mg/dL (ref 8–25)
CALCIUM: 8.6 mg/dL — ABNORMAL LOW (ref 8.8–10.2)
CHLORIDE: 111 mmol/L (ref 96–111)
CO2 TOTAL: 22 mmol/L — ABNORMAL LOW (ref 23–31)
CREATININE: 0.77 mg/dL (ref 0.60–1.05)
ESTIMATED GFR: 85 mL/min/BSA (ref >=60)
GLUCOSE: 83 mg/dL (ref 65–125)
POTASSIUM: 4.6 mmol/L (ref 3.5–5.1)
PROTEIN TOTAL: 6.8 g/dL (ref 6.0–8.0)
SODIUM: 140 mmol/L (ref 136–145)

## 2020-12-18 LAB — CBC WITH DIFF
BASOPHIL #: <0.1 10*3/uL (ref <=0.20)
BASOPHIL %: 0 %
EOSINOPHIL #: <0.1 10*3/uL (ref <=0.50)
EOSINOPHIL %: 0 %
HCT: 27.2 % — ABNORMAL LOW (ref 34.8–46.0)
HGB: 8.8 g/dL — ABNORMAL LOW (ref 11.5–16.0)
IMMATURE GRANULOCYTE #: <0.1 10*3/uL (ref <0.10)
IMMATURE GRANULOCYTE %: 2 % — ABNORMAL HIGH (ref 0–1)
LYMPHOCYTE #: 0.48 10*3/uL — ABNORMAL LOW (ref 1.00–4.80)
LYMPHOCYTE %: 19 %
MCH: 31.8 pg (ref 26.0–32.0)
MCHC: 32.4 g/dL (ref 31.0–35.5)
MCV: 98.2 fL (ref 78.0–100.0)
MONOCYTE #: 0.67 10*3/uL (ref 0.20–1.10)
MONOCYTE %: 26 %
MPV: 10 fL (ref 8.7–12.5)
NEUTROPHIL #: 1.38 10*3/uL — ABNORMAL LOW (ref 1.50–7.70)
NEUTROPHIL %: 53 %
PLATELETS: 211 10*3/uL (ref 150–400)
RBC: 2.77 10*6/uL — ABNORMAL LOW (ref 3.85–5.22)
RDW-CV: 15.9 % — ABNORMAL HIGH (ref 11.5–15.5)
WBC: 2.6 10*3/uL — ABNORMAL LOW (ref 3.7–11.0)

## 2020-12-18 LAB — MAGNESIUM: MAGNESIUM: 2 mg/dL (ref 1.8–2.6)

## 2020-12-18 NOTE — Progress Notes (Signed)
Return Patient Progress Note    Date: 12/18/2020    Name: Gina Barrett  MRN: [id number]  DOB: 07-17-1955   Referring Physician: Self, Referral  Primary Care Provider: Lanier Ensign, FNP-C    Reason for visit/consultation Lung Cancer (Pre-chemo visit)      Interval History:   Ms. Shreiner returns to clinic today with her husband for follow-up.  She is scheduled for chemotherapy on 12/20/2020 with her restaging CT scan scheduled for 12/21/2020.  Both patient and husband report progressive increase in sense of well-being and strength.  Her nutrition continues to improve and she remains on Remeron.  While she remains weak.  She has been able to stand on her own and to take a few steps.  She denies any cardiopulmonary, GI, or GU symptoms.  Her pain is controlled with Percocet as needed.  She has not fallen.    ROS:     Review of Systems   Cardiovascular: Positive for leg swelling.   Musculoskeletal: Positive for gait problem.   Neurological: Positive for gait problem.   Hematological: Bruises/bleeds easily.   All other systems reviewed and are negative.     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  -PET-CT scan on 09/06/2016 showed 10 mm right upper lobe FDG avid lesion that is slightly larger than 2017 CT and concerning for malignancy.  A separate 3 cm long area of tracer uptake in the left lower lobe was noted.  -CT-guided biopsy a [facility] on 10/02/2016 was positive for adenocarcinoma that is well to moderately differentiated.  -Mediastinoscopy 11/13/2016 positive for left mid  paratrachael    Stage: IV, initially diagnosed as IIIB     Molecular/special studies:  ROS-1, ALK-1, EGFR, PDL1 all negative    Treatment & Surveillance:   1. S/p chemoradiation with carboplatin + paclitaxel + RT 66.6 Gy completed 02/10/19/18    2. Serial imaging since completion of therapy has been negative for recurrence.  Most recent imaging in on 12/03/2018 was also negative but showed a new T6 compression abnormality.     3. Chest CT with contrast from 09/23/2019 which in summary showed increasing pleural based density in the LLL which may be infectious or inflammatory.  There is increased 1.7cm LN in left hilum ans stable compression abnormality of T6.    4. Biopsy of left LL pleural bases mass on 11/02/2019 showed benign fibrovascular tissue with focal chronic inflammation.  no lung parechyma or malgnancy identified.    5. Patient developed progressive musculoskeletal pain around October /November of 2021 evaluation with multiple imaging studies highly suggest metastatic disease.  Evaluation for plasma cell was noncontributory.  Patient was referred to orthopedic surgery (Dr. Hervey Ard, by family request and known to patient)  for surgical recommendation especially regarding weight-bearing.    6. Biopsy of paraspinal soft tissue mass on 07/04/2020 confirmed metastatic carcinoma consistent with lung primary. CARIS NGS negative    7.Radiation Oncology for radiation to the acetabulum as well as the paraspinal mass started 07/19/2020 x 10 , ending 07/31/2020    8. Mediport placed  07/19/2020    9.Begin Zoledronic acid.  Has dentures and no dental eval needed    10. Brain MRI planne2/12/2020  11.  Begin palliative carboplatin & pemetrexed on 08/03/2020       ________________________________________________________________________________________________________________________________________________     Objective   Objective:   BP 126/66 (Site: Right, Patient Position: Sitting)   Pulse (!) 107   Temp 36.8 C (98.2  F) (Thermal Scan)   Resp (!) 24   Ht 1.549 m (5' 0.98") Comment: *  Wt 44 kg (97 lb)   SpO2 98% Comment: ra  BMI 18.34 kg/m       ECOG Status: 3 - Capable of only limited selfcare, confined to bed or chair more than 50% of waking hour.  Physical Exam  Constitutional:       General: She is not in acute distress.  Eyes:      General: No scleral icterus.  Cardiovascular:      Rate and Rhythm: Regular rhythm.      Heart sounds: Normal heart sounds.   Pulmonary:      Breath sounds: Normal breath sounds.   Abdominal:      Palpations: Abdomen is soft. There is no mass.      Tenderness: There is no abdominal tenderness.   Musculoskeletal:         General: No swelling or tenderness.      Right lower leg: Edema present.      Left lower leg: Edema present.      Comments: edema is mostly around ankle with L>R   Skin:     Coloration: Skin is pale.   Neurological:      Mental Status: She is alert.          Past Medical History:   Diagnosis Date   . Abnormal Pap smear     ASCUS, ASCUS cannot rule out HGSIL   . Cancer (CMS HCC)     non small cell lung cancer (R)-s/p Chemo/Radiation completed 8/18   . Colon polyp    . Dysphagia    . Hypothyroid    . Non-small cell lung cancer, right (CMS [place])     s/p Chemo completed 9/18   . Raynaud's disease    . STD (sexually transmitted disease)     HSV   . Unspecified disorder of thyroid     Hypothyroidism         Current Outpatient Medications   Medication Sig   . Artifi. Tear, Hypromellose,-PF (RETAINE [facility]MC, PF,) 0.3 % Ophthalmic Drops Administer into affected eye(s) Once per day as needed   . carboxymethyl/glycerin/poly80 (REFRESH OPTIVE ADVANCED OPHT) Apply to eye Once per day as needed   . carboxymethylcellulose Sodium (THERATEARS) 0.25 % Ophthalmic Drops 1-2 Drops Once per day as needed   . folic acid (FOLVITE) 1 mg Oral Tablet TAKE ONE TABLET BY MOUTH EVERY DAY   . levothyroxine (SYNTHROID) 88 mcg Oral Tablet TAKE ONE TABLET BY MOUTH EVERY DAY   . loratadine (CLARITIN) 10 mg Oral  Tablet take 10 mg by mouth Once a day.   Marland Kitchen MEDIHONEY, HONEY, 80 % Gel Twice daily to affected area(s)   . megestroL (MEGACE) 40 mg Oral Tablet Take 40 mg by mouth   . mirtazapine (REMERON) 15 mg Oral Tablet Take 1 Tablet (15 mg total) by mouth Every night   . MULTIVITAMIN ORAL Take by mouth Once a day Centrum Silver Women's 50+   . nebivoloL (BYSTOLIC) 5 mg Oral Tablet    . Nifedipine (ADALAT CC) 30 mg Oral Tablet Sustained Release take 30 mg by mouth Once a day.   . ondansetron [facility]  ODT) 8 mg Oral Tablet, Rapid Dissolve Place 1 Tablet (8 mg total) under the tongue Every 8 hours as needed for Nausea/Vomiting Indications: nausea and vomiting caused by cancer drugs   . oxyCODONE-acetaminophen (PERCOCET) 10-325 mg Oral Tablet Take 1 Tablet by mouth Every 6 hours as needed for Pain for up to 30 days   . polyethylene glycol (MIRALAX) 17 gram Oral Powder in Packet Take 1 Packet (17 g total) by mouth Once a day   . prochlorperazine (COMPAZINE) 10 mg Oral Tablet Take 1 Tablet (10 mg total) by mouth Four times a day as needed for Nausea/Vomiting   . sennosides-docusate sodium (SENOKOT-S) 8.6-50 mg Oral Tablet Take 1 Tablet by mouth Every evening   . tiotropium-olodateroL (STIOLTO RESPIMAT) 2.5-2.5 mcg/actuation Inhalation Mist Take 2 INHALATION by inhalation Once a day   . UNKNOWN MEDICATION (UNKNOWN MEDICATION) Administer into affected eye(s) [name] 128 Eye Drops OTC 4x daily  [name] 128 Eye Gel OTC Nightly     Allergies as of 12/18/2020 - Reviewed 12/18/2020   Allergen Reaction Noted   . Penicillins  08/15/2010       Social History  Occupation:   Social History     Occupational History     Employer: [place] [place] [address]   reports that she quit smoking about 22 years ago. Her smoking use included cigarettes. She started smoking about 50 years ago. She quit after 20.00 years of use. She has never used smokeless tobacco. She reports being sexually active and has had partner(s) who are female.  She reports using the following method of birth control/protection: None. She reports that she does not drink alcohol and does not use drugs.    Labs:  Labs reviewed and interpreted:  Results for WINSLOW, VERRILL (MRN [id number]) as of 12/23/2020 20:56   Ref. Range 09/26/2020 16:14 10/18/2020 13:41 11/08/2020 12:46 11/29/2020 13:04 12/18/2020 11:32   WBC Latest Ref Range: 3.7 - 11.0 x10^3/uL 2.4 (L) 4.1 3.0 (L) 2.6 (L) 2.6 (L)   HGB Latest Ref Range: 11.5 - 16.0 g/dL 7.7 (L) 9.1 (L) 9.0 (L) 8.8 (L) 8.8 (L)   HCT Latest Ref Range: 34.8 - 46.0 % 25.2 (L) 29.8 (L) 28.4 (L) 27.6 (L) 27.2 (L)   PLATELET COUNT Latest Ref Range: 150 - 400 x10^3/uL 368 297 217 267 211   RBC Latest Ref Range: 3.85 - 5.22 x10^6/uL 2.68 (L) 3.07 (L) 2.92 (L) 2.85 (L) 2.77 (L)   MCV Latest Ref Range: 78.0 - 100.0 fL 94.0 97.1 97.3 96.8 98.2   MCHC Latest Ref Range: 31.0 - 35.5 g/dL 30.6 (L) 30.5 (L) 31.7 31.9 32.4   MCH Latest Ref Range: 26.0 - 32.0 pg 28.7 29.6 30.8 30.9 31.8   RDW-CV Latest Ref Range: 11.5 - 15.5 % 21.6 (H) 19.7 (H) 17.3 (H) 15.7 (H) 15.9 (H)   MPV Latest Ref Range: 8.7 - 12.5 fL 10.3 9.6 9.0 9.1 10.0   PMN'S Latest Units: % 47 66 59 55 53   LYMPHOCYTES Latest Units: % _0 EOSINOPHIL Latest Units: % 0 _1 0   MONOCYTES Latest Units: % 33 _2 BASOPHILS Latest Units: % 0 1 0 1 0   IMMATURE GRANULOCYTE % Latest Ref Range: 0 - 1 % 2 (H) 1 0 2 (H) 2 (H)   IMMATURE GRANULOCYTE # Latest Ref Range: <0.10 x10^3/uL <0.10 <0.10 <0.10 <0.10 <0.10   PMN ABS  Latest Ref Range: 1.50 - 7.70 x10^3/uL 1.09 (L) 2.72 1.72 1.45 (L) 1.38 (L)   LYMPHS ABS Latest Ref Range: 1.00 - 4.80 x10^3/uL 0.43 (L) 0.54 (L) 0.54 (L) 0.35 (L) 0.48 (L)   EOS ABS Latest Ref Range: <=0.50 x10^3/uL <0.10 <0.10 <0.10 <0.10 <0.10   MONOS ABS Latest Ref Range: 0.20 - 1.10 x10^3/uL 0.77 0.72 0.65 0.70 0.67   BASOS ABS Latest Ref Range: <=0.20 x10^3/uL <0.10 <0.10 <0.10 <0.10 <0.10   SODIUM Latest Ref Range: 136 - 145 mmol/L 139 141 140 141 140    POTASSIUM Latest Ref Range: 3.5 - 5.1 mmol/L 4.3 4.4 4.4 4.4 4.6   CHLORIDE Latest Ref Range: 96 - 111 mmol/L 107 110 109 110 111   CARBON DIOXIDE Latest Ref Range: 23 - 31 mmol/L 21 (L) _0 (L)   BUN Latest Ref Range: 8 - 25 mg/dL _1 CREATININE Latest Ref Range: 0.60 - 1.05 mg/dL 0.64 0.70 0.73 0.76 0.77   GLUCOSE Latest Ref Range: 65 - 125 mg/dL 87 114 90 98 83   ANION GAP Latest Ref Range: 4 - 13 mmol/L _2 BUN/CREAT RATIO Latest Ref Range: 6 - _3 ESTIMATED GLOMERULAR FILTRATION RATE Latest Ref Range: >=60 mL/min/BSA >90 >90 86 86 85   CALCIUM Latest Ref Range: 8.8 - 10.2 mg/dL 8.8 9.0 8.6 (L) 8.8 8.6 (L)   MAGNESIUM Latest Ref Range: 1.8 - 2.6 mg/dL    1.8 2.0   TOTAL PROTEIN Latest Ref Range: 6.0 - 8.0 g/dL 6.5 6.9 6.9 6.8 6.8   ALBUMIN Latest Ref Range: 3.4 - 4.8 g/dL  3.1 (L) 3.3 (L) 3.5 3.4 3.3 (L)   BILIRUBIN, TOTAL Latest Ref Range: 0.3 - 1.3 mg/dL 0.2 (L) 0.2 (L) 0.2 (L) 0.1 (L) 0.2 (L)   AST (SGOT) Latest Ref Range: 8 - 45 U/L _4 ALT (SGPT) Latest Ref Range: 8 - 22 U/L _5 (H)   ALKALINE PHOSPHATASE Latest Ref Range: 55 - 145 U/L 86 85 78 72 79       Radiology: All recent pertinent radiologic images and reports reviewed          Assessment/Plan:   1. Malignant neoplasm of lung, unspecified laterality, unspecified part of lung (CMS [place])  65 year old woman with metastatic recurrence of disease who is status post 6 cycles of carboplatin and pemetrexed.  She has tolerated last 2 cycles quite well except for some cytopenias.  I think she is benefited greatly from starting Remeron but her appetite increased and her overall her look better.  She has not had any infectious complications on May in time benefit from a unit of blood transfusion.  I plan to proceed with her chemotherapy and look forward to reviewing her CT scan with her by telephone when completed on 12/21/2020.      -Chemotherapy orders were reviewed, dosage adjustment were  made in accordance with labs and toxicity, orders were signed and therapy was coordinated and supervised with the infusion center.      2. Bone metastasis (CMS HCC)  -continue zoledronic acid calcium and vitamin D  -her pain is well controlled.  -discussed need to continue to avoid falling.    3. Leukopenia due to antineoplastic chemotherapy (CMS HCC)  -remains on complicated by infection  -I suspect she will need Neupogen support as a  chemotherapy continues perhaps approximately 2 days.  Her [place] today is 1380 and sufficient to proceed with chemotherapy.    4. Antineoplastic chemotherapy induced anemia  -stable and likely symptomatic if she ismore active.  -consider blood transfusion if hemoglobin less than 8 g.     5. Anorexia  -continue Remeron which is also helping with her mood.    6. Physical deconditioning  -patient exhausted approved physical therapy.  -I encouraged her to continue working with her husband's support.    7. Cancer associated pain  -controlled with Percocet as needed.      Return in about 3 weeks (around 01/08/2021) for [place] one week, In Person Visit, Telephone Visit.    Mertie Moores, MD        CC:  PCP General:  Lanier Ensign, FNP-C  [facility] [address]  [place] [address]      Portions of this note may be dictated using voice recognition software or a dictation service. Variances in spelling and vocabulary are possible and unintentional. Not all errors are caught/corrected. Please notify the [facility] if any discrepancies are noted or if the meaning of any statement is not clear.

## 2020-12-19 ENCOUNTER — Other Ambulatory Visit (INDEPENDENT_AMBULATORY_CARE_PROVIDER_SITE_OTHER): Payer: Self-pay | Admitting: Family

## 2020-12-20 ENCOUNTER — Other Ambulatory Visit: Payer: Self-pay

## 2020-12-20 ENCOUNTER — Encounter (HOSPITAL_COMMUNITY): Payer: Self-pay

## 2020-12-20 ENCOUNTER — Inpatient Hospital Stay
Admission: RE | Admit: 2020-12-20 | Discharge: 2020-12-20 | Disposition: A | Discharge status: Still In Hospital | Payer: Medicare Other | Source: Ambulatory Visit | Attending: Hematology & Oncology | Admitting: Hematology & Oncology

## 2020-12-20 VITALS — BP 118/65 | HR 97 | Temp 98.4°F | Resp 24 | Ht 60.98 in | Wt 96.2 lb

## 2020-12-20 DIAGNOSIS — C7951 Secondary malignant neoplasm of bone: Secondary | ICD-10-CM | POA: Insufficient documentation

## 2020-12-20 DIAGNOSIS — C801 Malignant (primary) neoplasm, unspecified: Secondary | ICD-10-CM | POA: Insufficient documentation

## 2020-12-20 DIAGNOSIS — Z5111 Encounter for antineoplastic chemotherapy: Secondary | ICD-10-CM | POA: Insufficient documentation

## 2020-12-20 DIAGNOSIS — C3491 Malignant neoplasm of unspecified part of right bronchus or lung: Secondary | ICD-10-CM | POA: Insufficient documentation

## 2020-12-20 DIAGNOSIS — C3490 Malignant neoplasm of unspecified part of unspecified bronchus or lung: Secondary | ICD-10-CM

## 2020-12-20 MED ORDER — SODIUM CHLORIDE 0.9 % INTRAVENOUS SOLUTION
12.0000 mg | Freq: Once | INTRAVENOUS | Status: AC
Start: 2020-12-20 — End: 2020-12-20
  Administered 2020-12-20: 14:00:00 12 mg via INTRAVENOUS
  Administered 2020-12-20: 0 mg via INTRAVENOUS
  Administered 2020-12-20: 12 mg via INTRAVENOUS
  Filled 2020-12-20: qty 3

## 2020-12-20 MED ORDER — SODIUM CHLORIDE 0.9 % INTRAVENOUS SOLUTION
298.0000 mg | Freq: Once | INTRAVENOUS | Status: AC
Start: 2020-12-20 — End: 2020-12-20
  Administered 2020-12-20: 0 mg via INTRAVENOUS
  Administered 2020-12-20: 300 mg via INTRAVENOUS
  Filled 2020-12-20: qty 30

## 2020-12-20 MED ORDER — PALONOSETRON 0.25 MG/5 ML INTRAVENOUS SOLUTION
0.2500 mg | Freq: Once | INTRAVENOUS | Status: AC
Start: 2020-12-20 — End: 2020-12-20
  Administered 2020-12-20: 0.25 mg via INTRAVENOUS
  Filled 2020-12-20: qty 5

## 2020-12-20 MED ORDER — SODIUM CHLORIDE 0.9 % (FLUSH) INJECTION SYRINGE
10.0000 mL | INJECTION | Freq: Once | INTRAMUSCULAR | Status: AC
Start: 2020-12-20 — End: 2020-12-20
  Administered 2020-12-20: 10 mL

## 2020-12-20 MED ORDER — SODIUM CHLORIDE 0.9 % INTRAVENOUS SOLUTION
375.0000 mg/m2 | Freq: Once | INTRAVENOUS | Status: AC
Start: 2020-12-20 — End: 2020-12-20
  Administered 2020-12-20: 525 mg via INTRAVENOUS
  Administered 2020-12-20: 0 mg via INTRAVENOUS
  Filled 2020-12-20: qty 20

## 2020-12-20 MED ORDER — GASTROVIEW 15 ML IN 500 ML SW ORAL SOLUTION - CHI
15.0000 mL | ORAL | Status: DC
Start: 2020-12-20 — End: 2020-12-22

## 2020-12-20 MED ORDER — IOPAMIDOL 370 MG IODINE/ML (76 %) INTRAVENOUS SOLUTION
100.0000 mL | INTRAVENOUS | Status: AC
Start: 2020-12-20 — End: 2020-12-21
  Administered 2020-12-21 (×2): 59 mL via INTRAVENOUS
  Filled 2020-12-20: qty 100

## 2020-12-20 MED ORDER — HEPARIN, PORCINE (PF) 100 UNIT/ML INTRAVENOUS SYRINGE
5.0000 mL | INJECTION | Freq: Once | INTRAVENOUS | Status: AC
Start: 2020-12-20 — End: 2020-12-20
  Administered 2020-12-20: 5 mL
  Filled 2020-12-20: qty 5

## 2020-12-20 MED ORDER — SODIUM CHLORIDE 0.9 % INTRAVENOUS SOLUTION
150.0000 mg | Freq: Once | INTRAVENOUS | Status: AC
Start: 2020-12-20 — End: 2020-12-20
  Administered 2020-12-20: 0 mg via INTRAVENOUS
  Administered 2020-12-20: 150 mg via INTRAVENOUS
  Filled 2020-12-20: qty 5

## 2020-12-20 NOTE — Discharge Instructions (Signed)
Patient Education     Discharge Instructions for Chemotherapy  Your healthcare provider prescribed a type of medicine therapy for you called chemotherapy. It's also known as chemo. Chemo is used for many different types of illnesses, including cancer. There are many types of chemo. This sheet gives some general information on how you can take care ofyourself after your chemo. Talk with your healthcare provider about other details based on your own treatment plan.  Mouth care  You may get mouth sores, even if you follow all of your healthcare provider's instructions. Many people get mouth sores as a side effect of chemo. Here's what you can do to help prevent mouth sores:   Keep your mouth clean. Brush your teeth with a soft-bristle toothbrush after every meal.   Ask if you should use a toothpaste with fluoride. Or you may be told to brush your teeth with a mixture of 1 teaspoon of salt in 8 ounces of water.   Use an oral swab or special soft toothbrush if your gums bleed during brushing.   Don't use dental floss if it causes your gums to bleed.   Use any mouthwash given to you as directed.   If you can't tolerate regular methods, use salt and baking soda to clean your mouth. Mix1teaspoonof salt and1teaspoon of baking soda in 1 quart of warm water. Swish and spit.   If you wear dentures, you may be told to wear them only when you eat. Ask your healthcare provider. Clean your dentures twice a day. Soak them in antimicrobial solution when you aren't wearing them. Rinse your mouth after each meal.   Check your mouth and tonguefor white patches. Thismay bea sign of a type of yeast infection called thrush. This is a common side effect of chemo. Tell your healthcare provider if you get these patches. He or she can prescribe medicine to treat them.  Other self-care  Here's what else you can do:   Try to exercise. Exercise keeps you strong and keeps your heart and lungs active. Walking and yoga are good  types of exercise.   Keep clean. During chemo, your body can't fight infection very well. Take short baths or showers. Wash your hands before you eat and after going to the bathroom.   Avoid people who are sick with an illness you could catch. This includes people with colds, flu, measles, or chicken pox. This also includes people who have recently had a vaccine for any illness.   Let your healthcare provider know if your throat is sore. You may have an infection that needs treatment.   Be gentle to your skin. Use moisturizing soap. Chemo can make your skin dry. Apply lotion several times a day to help relieve dry skin. Don't take very hot or very cold showers or baths.   Don't be surprised if your chemo causes slight burns to your skin. This can happen most often on the hands and feet. Some medicines used in high doses cause this. Ask for a special cream to help relieve the burn and protect your skin.  Many people on chemo feel sick and find it hard to eat during treatment. Try these tips:   Eat small meals several times a day to keep your strength up.   Choose bland foods with little taste or smell if you are reacting strongly to food.   Be sure to cook all food fully. This kills bacteria and helps you prevent illness.   Eat foods that   are soft. Soft foods are less likely to cause stomach irritation.   Try to eat a variety of foods for a well-balanced diet. Drink plenty of fluids. Eat foods with fiber unless your healthcare provider says not to. Fiber can help to prevent constipation.  When to call your healthcare provider  Call your healthcare provider right away if you have any of the following:   Unexplained bleeding   Trouble concentrating   Ongoing fatigue   Shortness of breath, wheezing, trouble breathing, or bad cough   Rapid, irregular heartbeat, or chest pain   Dizziness, lightheadedness   Constant feeling of being cold   Hives oracut or rash that swells, turns red, feels hot or  painful, or begins to ooze   Burning when you urinate   Feverof100.4F (38C) orhigher, oras directed by your healthcare provider  [employer] last reviewed this educational content on 10/14/2017   2000-2021 [employer]. All rights reserved. This information is not intended as a substitute for professional medical care. Always follow your healthcare professional's instructions.

## 2020-12-20 NOTE — Nurses Notes (Signed)
Patient discharged home with family, to vehicle via WC.  AVS reviewed with patient/care giver.  A written copy of the AVS and discharge instructions was given to the patient/care giver.  Questions sufficiently answered as needed.  Patient/care giver encouraged to follow up with PCP as indicated.  In the event of an emergency, patient/care giver instructed to call 911 or go to the nearest emergency room.

## 2020-12-21 ENCOUNTER — Ambulatory Visit
Admission: RE | Admit: 2020-12-21 | Discharge: 2020-12-21 | Disposition: A | Discharge status: Home / Self Care | Payer: Medicare Other | Source: Ambulatory Visit | Attending: Diagnostic Radiology | Admitting: Diagnostic Radiology

## 2020-12-21 DIAGNOSIS — C7951 Secondary malignant neoplasm of bone: Secondary | ICD-10-CM | POA: Insufficient documentation

## 2020-12-21 DIAGNOSIS — C3491 Malignant neoplasm of unspecified part of right bronchus or lung: Secondary | ICD-10-CM | POA: Insufficient documentation

## 2020-12-21 DIAGNOSIS — C801 Malignant (primary) neoplasm, unspecified: Secondary | ICD-10-CM | POA: Insufficient documentation

## 2020-12-23 ENCOUNTER — Encounter (HOSPITAL_COMMUNITY): Payer: Self-pay | Admitting: Hematology & Oncology

## 2020-12-24 ENCOUNTER — Encounter (HOSPITAL_COMMUNITY): Payer: Self-pay | Admitting: Gastroenterology

## 2020-12-25 ENCOUNTER — Other Ambulatory Visit: Payer: Self-pay

## 2020-12-25 ENCOUNTER — Encounter (HOSPITAL_COMMUNITY): Payer: Self-pay | Admitting: Gastroenterology

## 2020-12-25 ENCOUNTER — Other Ambulatory Visit (HOSPITAL_COMMUNITY)
Admission: RE | Admit: 2020-12-25 | Discharge: 2020-12-25 | Disposition: A | Discharge status: Home / Self Care | Payer: Medicare Other | Source: Ambulatory Visit | Attending: Gastroenterology | Admitting: Gastroenterology

## 2020-12-25 DIAGNOSIS — Z01812 Encounter for preprocedural laboratory examination: Secondary | ICD-10-CM | POA: Insufficient documentation

## 2020-12-25 DIAGNOSIS — Z20822 Contact with and (suspected) exposure to covid-19: Secondary | ICD-10-CM | POA: Diagnosis not present

## 2020-12-25 LAB — SARS CORONAVIRUS 2 (TAT 6-24 HRS): SARS Coronavirus 2: NEGATIVE

## 2020-12-27 NOTE — Anesthesia Preprocedure Evaluation (Addendum)
Anesthesia Evaluation  Patient identified by MRN, date of birth, ID band Patient awake    Reviewed: Allergy & Precautions, NPO status , Patient's Chart, lab work & pertinent test results  Airway Mallampati: III  TM Distance: >3 FB Neck ROM: Full    Dental no notable dental hx. (+) Dental Advisory Given   Pulmonary neg pulmonary ROS,    Pulmonary exam normal breath sounds clear to auscultation       Cardiovascular hypertension, Normal cardiovascular exam Rhythm:Regular Rate:Normal     Neuro/Psych negative neurological ROS  negative psych ROS   GI/Hepatic   Endo/Other  Morbid obesity (BMI 53.3)  Renal/GU negative Renal ROS     Musculoskeletal negative musculoskeletal ROS (+)   Abdominal (+) + obese,   Peds  Hematology   Anesthesia Other Findings   Reproductive/Obstetrics negative OB ROS                            Anesthesia Physical Anesthesia Plan  ASA: 3  Anesthesia Plan: MAC   Post-op Pain Management:    Induction:   PONV Risk Score and Plan: 2 and Treatment may vary due to age or medical condition  Airway Management Planned: Natural Airway  Additional Equipment: None  Intra-op Plan:   Post-operative Plan:   Informed Consent: I have reviewed the patients History and Physical, chart, labs and discussed the procedure including the risks, benefits and alternatives for the proposed anesthesia with the patient or authorized representative who has indicated his/her understanding and acceptance.     Dental advisory given  Plan Discussed with: CRNA and Anesthesiologist  Anesthesia Plan Comments: (Screening colonoscopy)      Anesthesia Quick Evaluation

## 2020-12-28 ENCOUNTER — Ambulatory Visit (HOSPITAL_COMMUNITY): Payer: Medicare Other | Admitting: Anesthesiology

## 2020-12-28 ENCOUNTER — Encounter (HOSPITAL_COMMUNITY)
Admission: RE | Disposition: A | Discharge status: Home / Self Care | Payer: Self-pay | Source: Home / Self Care | Attending: Gastroenterology

## 2020-12-28 ENCOUNTER — Ambulatory Visit (HOSPITAL_COMMUNITY)
Admission: RE | Admit: 2020-12-28 | Discharge: 2020-12-28 | Disposition: A | Discharge status: Home / Self Care | Payer: Medicare Other | Attending: Gastroenterology | Admitting: Gastroenterology

## 2020-12-28 ENCOUNTER — Encounter (HOSPITAL_COMMUNITY): Payer: Self-pay | Admitting: Gastroenterology

## 2020-12-28 ENCOUNTER — Other Ambulatory Visit: Payer: Self-pay

## 2020-12-28 DIAGNOSIS — Z86010 Personal history of colonic polyps: Secondary | ICD-10-CM | POA: Insufficient documentation

## 2020-12-28 DIAGNOSIS — Z1211 Encounter for screening for malignant neoplasm of colon: Secondary | ICD-10-CM | POA: Diagnosis not present

## 2020-12-28 DIAGNOSIS — D123 Benign neoplasm of transverse colon: Secondary | ICD-10-CM | POA: Insufficient documentation

## 2020-12-28 HISTORY — DX: Unspecified viral hepatitis C without hepatic coma: B19.20

## 2020-12-28 HISTORY — DX: Thyrotoxicosis, unspecified without thyrotoxic crisis or storm: E05.90

## 2020-12-28 HISTORY — DX: Nontoxic single thyroid nodule: E04.1

## 2020-12-28 HISTORY — DX: Essential (primary) hypertension: I10

## 2020-12-28 HISTORY — PX: COLONOSCOPY WITH PROPOFOL: SHX5780

## 2020-12-28 HISTORY — PX: POLYPECTOMY: SHX5525

## 2020-12-28 SURGERY — COLONOSCOPY WITH PROPOFOL
Anesthesia: Monitor Anesthesia Care

## 2020-12-28 MED ORDER — LABETALOL HCL 5 MG/ML IV SOLN
INTRAVENOUS | Status: AC
  Filled 2020-12-28: qty 4

## 2020-12-28 MED ORDER — PROPOFOL 10 MG/ML IV BOLUS
INTRAVENOUS | Status: AC
  Filled 2020-12-28: qty 20

## 2020-12-28 MED ORDER — PROPOFOL 500 MG/50ML IV EMUL
INTRAVENOUS | Status: DC | PRN
  Administered 2020-12-28: 140 ug/kg/min via INTRAVENOUS

## 2020-12-28 MED ORDER — PROPOFOL 500 MG/50ML IV EMUL
INTRAVENOUS | Status: DC | PRN
  Administered 2020-12-28 (×2): 20 mg via INTRAVENOUS
  Administered 2020-12-28: 30 mg via INTRAVENOUS

## 2020-12-28 MED ORDER — SODIUM CHLORIDE 0.9 % IV SOLN: INTRAVENOUS | Status: DC

## 2020-12-28 MED ORDER — LACTATED RINGERS IV SOLN: INTRAVENOUS | Status: DC | PRN

## 2020-12-28 MED ORDER — PROPOFOL 500 MG/50ML IV EMUL
INTRAVENOUS | Status: AC
  Filled 2020-12-28: qty 50

## 2020-12-28 MED ORDER — LABETALOL HCL 5 MG/ML IV SOLN
5.0000 mg | Freq: Once | INTRAVENOUS | Status: AC
  Administered 2020-12-28: 5 mg via INTRAVENOUS

## 2020-12-28 MED ORDER — PROPOFOL 1000 MG/100ML IV EMUL
INTRAVENOUS | Status: AC
  Filled 2020-12-28: qty 100

## 2020-12-28 SURGICAL SUPPLY — 21 items

## 2020-12-28 NOTE — H&P (Signed)
Julia Ware HPI: This 65 year old black female presents to the office for colorectal cancer screening. She was seen by NP-Julia Ware at [facility] for evaluation of ? Hepatitis C. She had labwork done 09/04/2019 but has not received the results. She was noted to have an ALT of 46 done on 05/08/2020. She has 1 BM per day with no obvious blood or mucus in the stool. She has good appetite and her weight has been stable. She denies having any complaints of abdominal pain, nausea, vomiting, acid reflux, dysphagia or odynophagia. She denies having a family history of colon cancer, celiac sprue or IBD. Her last colonoscopy wasdone 08/14/2010 when a tubular adenoma was removed.   Past Medical History:  Diagnosis Date   Hepatitis C    Hypertension    Hyperthyroidism    Thyroid nodule     History reviewed. No pertinent surgical history.  Family History  Problem Relation Age of Onset   Diabetes Mother    Hypertension Mother    Kidney disease Father    Hypertension Father    Hyperlipidemia Father     Social History:  reports that she has never smoked. She has never used smokeless tobacco. She reports current alcohol use. She reports that she does not use drugs.  Allergies: No Known Allergies  Medications: Scheduled: Continuous:  sodium chloride      No results found for this or any previous visit (from the past 24 hour(s)).   No results found.  ROS:  As stated above in the HPI otherwise negative.  Blood pressure (!) 207/81, temperature (!) 96.8 F (36 C), temperature source Temporal, resp. rate 13, height 5\' 7"  (1.702 m), weight (!) 158.8 kg, SpO2 99 %.    PE: Gen: NAD, Alert and Oriented HEENT:  [place]/AT, EOMI Neck: Supple, no LAD Lungs: CTA Bilaterally CV: RRR without M/G/R ABD: Soft, NTND, +BS Ext: No C/C/E  Assessment/Plan: 1) Personal history of polyp - colonoscopy.  Julia Ware 12/28/2020, 8:20 AM

## 2020-12-28 NOTE — Transfer of Care (Signed)
Immediate Anesthesia Transfer of Care Note  Patient: Julia Ware  Procedure(s) Performed: Procedure(s): COLONOSCOPY WITH PROPOFOL (N/A) POLYPECTOMY  Patient Location: PACU and Endoscopy Unit  Anesthesia Type:MAC  Level of Consciousness: awake, alert  and oriented  Airway & Oxygen Therapy: Patient Spontanous Breathing and Patient connected to nasal cannula oxygen  Post-op Assessment: Report given to RN and Post -op Vital signs reviewed and stable  Post vital signs: Reviewed and stable  Last Vitals:  Vitals:   12/28/20 0830 12/28/20 0840  BP: (!) 187/107 (!) 176/95  Resp:    Temp:    SpO2:      Complications: No apparent anesthesia complications

## 2020-12-28 NOTE — Anesthesia Postprocedure Evaluation (Signed)
Anesthesia Post Note  Patient: ALEIAH MOHAMMED  Procedure(s) Performed: COLONOSCOPY WITH PROPOFOL POLYPECTOMY     Patient location during evaluation: Endoscopy Anesthesia Type: MAC Level of consciousness: awake and alert Pain management: pain level controlled Vital Signs Assessment: post-procedure vital signs reviewed and stable Respiratory status: spontaneous breathing, nonlabored ventilation, respiratory function stable and patient connected to nasal cannula oxygen Cardiovascular status: blood pressure returned to baseline and stable Postop Assessment: no apparent nausea or vomiting Anesthetic complications: no   No notable events documented.  Last Vitals:  Vitals:   12/28/20 0930 12/28/20 0940  BP: (!) 149/71 (!) 165/91  Pulse: (!) 48 (!) 50  Resp: 20 20  Temp:    SpO2: 100% 100%    Last Pain:  Vitals:   12/28/20 0940  TempSrc:   PainSc: 0-No pain                 Barnet Glasgow

## 2020-12-28 NOTE — Discharge Instructions (Signed)

## 2020-12-28 NOTE — Op Note (Signed)
[facility] Patient Name: Julia Ware Procedure Date: 12/28/2020 MRN: [id number] Attending MD: Carol Ada , MD Date of Birth: May 20, 1956 CSN: [id number] Age: 65 Admit Type: Outpatient Procedure:                Colonoscopy Indications:              Screening for colorectal malignant neoplasm Providers:                Carol Ada, MD, Kary Kos RN, RN, Terrall Laity, Laverda Sorenson, Technician, Elba                            Alday CRNA, CRNA Referring MD:              Medicines:                 Complications:            No immediate complications. Estimated Blood Loss:     Estimated blood loss: none. Procedure:                Pre-Anesthesia Assessment:                           - Prior to the procedure, a History and Physical                            was performed, and patient medications and                            allergies were reviewed. The patient's tolerance of                            previous anesthesia was also reviewed. The risks                            and benefits of the procedure and the sedation                            options and risks were discussed with the patient.                            All questions were answered, and informed consent                            was obtained. Prior Anticoagulants: The patient has                            taken no previous anticoagulant or antiplatelet                            agents. ASA Grade Assessment: III - A patient with                            severe  systemic disease. After reviewing the risks                            and benefits, the patient was deemed in                            satisfactory condition to undergo the procedure.                           - Sedation was administered by an anesthesia                            professional. Deep sedation was attained.                           After obtaining informed consent, the colonoscope                             was passed under direct vision. Throughout the                            procedure, the patient's blood pressure, pulse, and                            oxygen saturations were monitored continuously. The                            CF-HQ190L ([id number] Olympus colonoscope was                            introduced through the anus and advanced to the the                            cecum, identified by appendiceal orifice and                            ileocecal valve. The colonoscopy was performed                            without difficulty. The patient tolerated the                            procedure well. The quality of the bowel                            preparation was excellent. The ileocecal valve,                            appendiceal orifice, and rectum were photographed. Scope In: 9:08:00 AM Scope Out: 9:18:58 AM Scope Withdrawal Time: 0 hours 9 minutes 10 seconds  Total Procedure Duration: 0 hours 10 minutes 58 seconds  Findings:      A 3 mm polyp was found in the transverse colon. The polyp was sessile.       The polyp was removed with a cold  snare. Resection and retrieval were       complete. Impression:               - One 3 mm polyp in the transverse colon, removed                            with a cold snare. Resected and retrieved. Moderate Sedation:      Not Applicable - Patient had care per Anesthesia. Recommendation:           - Patient has a contact number available for                            emergencies. The signs and symptoms of potential                            delayed complications were discussed with the                            patient. Return to normal activities tomorrow.                            Written discharge instructions were provided to the                            patient.                           - Resume previous diet.                           - Continue present medications.                           -  Await pathology results.                           - Repeat colonoscopy in 7 years for surveillance. Procedure Code(s):        --- Professional ---                           [phone], Colonoscopy, flexible; with removal of                            tumor(s), polyp(s), or other lesion(s) by snare                            technique Diagnosis Code(s):        --- Professional ---                           K63.5, Polyp of colon                           Z12.11, Encounter for screening for malignant                            neoplasm  of colon CPT copyright 2019 [employer]. All rights reserved. The codes documented in this report are preliminary and upon coder review may  be revised to meet current compliance requirements. Carol Ada, MD Carol Ada, MD 12/28/2020 9:32:55 AM This report has been signed electronically. Number of Addenda: 0

## 2020-12-31 LAB — SURGICAL PATHOLOGY

## 2021-01-07 ENCOUNTER — Other Ambulatory Visit (HOSPITAL_COMMUNITY): Payer: Self-pay | Admitting: Hematology & Oncology

## 2021-01-09 ENCOUNTER — Encounter (HOSPITAL_COMMUNITY): Payer: Self-pay | Admitting: Hematology & Oncology

## 2021-01-10 ENCOUNTER — Other Ambulatory Visit: Payer: Self-pay

## 2021-01-10 ENCOUNTER — Inpatient Hospital Stay (HOSPITAL_COMMUNITY)
Admission: RE | Admit: 2021-01-10 | Discharge: 2021-01-10 | Disposition: A | Discharge status: Still In Hospital | Payer: Medicare Other | Source: Ambulatory Visit | Attending: Hematology & Oncology | Admitting: Hematology & Oncology

## 2021-01-10 VITALS — BP 111/67 | HR 104 | Temp 98.9°F | Resp 22 | Ht 60.98 in | Wt 99.0 lb

## 2021-01-10 DIAGNOSIS — C7951 Secondary malignant neoplasm of bone: Secondary | ICD-10-CM | POA: Insufficient documentation

## 2021-01-10 DIAGNOSIS — D708 Other neutropenia: Secondary | ICD-10-CM

## 2021-01-10 DIAGNOSIS — Z95828 Presence of other vascular implants and grafts: Secondary | ICD-10-CM

## 2021-01-10 DIAGNOSIS — C3491 Malignant neoplasm of unspecified part of right bronchus or lung: Secondary | ICD-10-CM

## 2021-01-10 DIAGNOSIS — C801 Malignant (primary) neoplasm, unspecified: Secondary | ICD-10-CM

## 2021-01-10 DIAGNOSIS — C3490 Malignant neoplasm of unspecified part of unspecified bronchus or lung: Secondary | ICD-10-CM

## 2021-01-10 DIAGNOSIS — Z5111 Encounter for antineoplastic chemotherapy: Secondary | ICD-10-CM | POA: Insufficient documentation

## 2021-01-10 DIAGNOSIS — Z79899 Other long term (current) drug therapy: Secondary | ICD-10-CM | POA: Insufficient documentation

## 2021-01-10 DIAGNOSIS — D61811 Other drug-induced pancytopenia: Secondary | ICD-10-CM

## 2021-01-10 DIAGNOSIS — Z5189 Encounter for other specified aftercare: Secondary | ICD-10-CM | POA: Insufficient documentation

## 2021-01-10 LAB — CBC WITH DIFF
BASOPHIL #: <0.1 10*3/uL (ref <=0.20)
BASOPHIL %: 0 %
EOSINOPHIL #: <0.1 10*3/uL (ref <=0.50)
EOSINOPHIL %: 0 %
HCT: 24.8 % — ABNORMAL LOW (ref 34.8–46.0)
HGB: 8 g/dL — ABNORMAL LOW (ref 11.5–16.0)
IMMATURE GRANULOCYTE #: <0.1 10*3/uL (ref <0.10)
IMMATURE GRANULOCYTE %: 2 % — ABNORMAL HIGH (ref 0–1)
LYMPHOCYTE #: 0.37 10*3/uL — ABNORMAL LOW (ref 1.00–4.80)
LYMPHOCYTE %: 14 %
MCH: 31.6 pg (ref 26.0–32.0)
MCHC: 32.3 g/dL (ref 31.0–35.5)
MCV: 98 fL (ref 78.0–100.0)
MONOCYTE #: 0.64 10*3/uL (ref 0.20–1.10)
MONOCYTE %: 24 %
MPV: 10 fL (ref 8.7–12.5)
NEUTROPHIL #: 1.59 10*3/uL (ref 1.50–7.70)
NEUTROPHIL %: 60 %
PLATELETS: 154 10*3/uL (ref 150–400)
RBC: 2.53 10*6/uL — ABNORMAL LOW (ref 3.85–5.22)
RDW-CV: 16.5 % — ABNORMAL HIGH (ref 11.5–15.5)
WBC: 2.7 10*3/uL — ABNORMAL LOW (ref 3.7–11.0)

## 2021-01-10 LAB — COMPREHENSIVE METABOLIC PANEL, NON-FASTING
ALBUMIN: 3.2 g/dL — ABNORMAL LOW (ref 3.4–4.8)
ALKALINE PHOSPHATASE: 75 U/L (ref 55–145)
ALT (SGPT): 16 U/L (ref 8–22)
ANION GAP: 6 mmol/L (ref 4–13)
AST (SGOT): 24 U/L (ref 8–45)
BILIRUBIN TOTAL: 0.2 mg/dL — ABNORMAL LOW (ref 0.3–1.3)
BUN/CREA RATIO: 18 (ref 6–22)
BUN: 17 mg/dL (ref 8–25)
CALCIUM: 9.1 mg/dL (ref 8.8–10.2)
CHLORIDE: 112 mmol/L — ABNORMAL HIGH (ref 96–111)
CO2 TOTAL: 24 mmol/L (ref 23–31)
CREATININE: 0.95 mg/dL (ref 0.60–1.05)
ESTIMATED GFR: 66 mL/min/BSA (ref >=60)
GLUCOSE: 100 mg/dL (ref 65–125)
POTASSIUM: 4.2 mmol/L (ref 3.5–5.1)
PROTEIN TOTAL: 6.7 g/dL (ref 6.0–8.0)
SODIUM: 142 mmol/L (ref 136–145)

## 2021-01-10 LAB — MAGNESIUM: MAGNESIUM: 1.8 mg/dL (ref 1.8–2.6)

## 2021-01-10 MED ORDER — ZOLEDRONIC ACID 4 MG/5 ML INTRAVENOUS SOLUTION
3.3000 mg | Freq: Once | INTRAVENOUS | Status: AC
Start: 2021-01-10 — End: 2021-01-10
  Administered 2021-01-10: 0 mg via INTRAVENOUS
  Administered 2021-01-10: 3.3 mg via INTRAVENOUS
  Filled 2021-01-10: qty 4.13

## 2021-01-10 MED ORDER — HEPARIN, PORCINE (PF) 100 UNIT/ML INTRAVENOUS SYRINGE
5.0000 mL | INJECTION | Freq: Once | INTRAVENOUS | Status: AC
Start: 2021-01-10 — End: 2021-01-10
  Administered 2021-01-10: 5 mL
  Filled 2021-01-10: qty 5

## 2021-01-10 MED ORDER — SODIUM CHLORIDE 0.9 % (FLUSH) INJECTION SYRINGE
10.0000 mL | INJECTION | Freq: Once | INTRAMUSCULAR | Status: AC
Start: 2021-01-10 — End: 2021-01-10
  Administered 2021-01-10: 10 mL

## 2021-01-10 MED ORDER — SODIUM CHLORIDE 0.9% FLUSH BAG - 250 ML
INTRAVENOUS | Status: DC | PRN
Start: 2021-01-10 — End: 2021-01-11

## 2021-01-10 MED ORDER — SODIUM CHLORIDE 0.9 % INTRAVENOUS SOLUTION
375.0000 mg/m2 | Freq: Once | INTRAVENOUS | Status: AC
Start: 2021-01-10 — End: 2021-01-10
  Administered 2021-01-10: 525 mg via INTRAVENOUS
  Administered 2021-01-10: 0 mg via INTRAVENOUS
  Filled 2021-01-10: qty 20

## 2021-01-10 MED ORDER — PALONOSETRON 0.25 MG/5 ML INTRAVENOUS SOLUTION
0.2500 mg | Freq: Once | INTRAVENOUS | Status: AC
Start: 2021-01-10 — End: 2021-01-10
  Administered 2021-01-10: 0.25 mg via INTRAVENOUS
  Filled 2021-01-10: qty 5

## 2021-01-10 MED ORDER — SODIUM CHLORIDE 0.9 % INTRAVENOUS SOLUTION
265.2000 mg | Freq: Once | INTRAVENOUS | Status: AC
Start: 2021-01-10 — End: 2021-01-10
  Administered 2021-01-10: 0 mg via INTRAVENOUS
  Administered 2021-01-10: 265 mg via INTRAVENOUS
  Filled 2021-01-10: qty 26.5

## 2021-01-10 MED ORDER — SODIUM CHLORIDE 0.9 % INTRAVENOUS SOLUTION
12.0000 mg | Freq: Once | INTRAVENOUS | Status: AC
Start: 2021-01-10 — End: 2021-01-10
  Administered 2021-01-10: 0 mg via INTRAVENOUS
  Administered 2021-01-10: 12 mg via INTRAVENOUS
  Filled 2021-01-10: qty 3

## 2021-01-10 MED ORDER — SODIUM CHLORIDE 0.9 % INTRAVENOUS SOLUTION
150.0000 mg | Freq: Once | INTRAVENOUS | Status: AC
Start: 2021-01-10 — End: 2021-01-10
  Administered 2021-01-10: 150 mg via INTRAVENOUS
  Administered 2021-01-10: 0 mg via INTRAVENOUS
  Administered 2021-01-10: 12:00:00 150 mg via INTRAVENOUS
  Filled 2021-01-10: qty 5

## 2021-01-10 MED ORDER — CYANOCOBALAMIN (VIT B-12) 1,000 MCG/ML INJECTION SOLUTION
1000.0000 ug | Freq: Once | INTRAMUSCULAR | Status: AC
Start: 2021-01-10 — End: 2021-01-10
  Administered 2021-01-10: 1000 ug via SUBCUTANEOUS
  Filled 2021-01-10: qty 1

## 2021-01-10 NOTE — Nurses Notes (Signed)
Patient was informed to start taking Calcium + vit D twice daily while on Zometa.  The medication was added to her home med list. She was also informed that she would receive Zarxio x3 days starting the day after chemotherapy treatment. Written information regarding the medication was printed along with her AVS.     Patient discharged home via wheelchair to waiting room.  AVS reviewed with patient.  A written copy of the AVS and discharge instructions was given to the patient.  Questions sufficiently answered as needed.  Patient encouraged to follow up with PCP as indicated.  In the event of an emergency, patient instructed to call 911 or go to the nearest emergency room.

## 2021-01-10 NOTE — Discharge Instructions (Signed)
Patient Education     Cancer: Managing Fatigue     Family members can help with meals and chores around the house.   Extreme tiredness, called fatigue, is common when you have cancer. It can be caused by worry, lack of sleep, or low appetite. Fatigue can also be a sign of anemia. This is when your blood doesn't have enough red blood cells. You might need medical treatment for anemia. Red blood cellscarry oxygen to all the cells in your body, and low levels can make you tired. The tips below can help you feel better.  Saving your energy   Keep track of the times of day when you are most tired and plan around them. For instance, if you are more tired in the afternoon, try to get tasks done in the morning.   Decide which tasks are most important. Do those first.   Pass tasks along to others when you need to. Ask for help.   Accept help when it's offered. Tell people what they can do to help. For instance, you may need someone to fix a meal, fold clothes, or put gas in your car.   Plan rest times. You may want to take a nap each day. Just sitting quietly for a few minutes can also make you feel more rested.  Taking care of yourself   Relax before you go to bed. Take a bath or read for a while.   Keep regular hours. Go to bed at the same time each night and get up at the same time each morning.   Eat well. Choose foods from all of the food groups each day.   Exercise. Take a brisk walk to help increase your energy.   Don't have caffeine or alcohol. Drink plenty of water or fruit juices instead.  Treating anemia  If you begin to feel more tired than normal, tell yourhealthcare provider. Fatigue could be a sign of anemia. This problem is common in people with cancer, especially during chemotherapy and radiation treatments. If your red blood cell count is too low, you may need a blood transfusion. In some cases, you may need medicine to increase the number of red blood cells your body makes.  Call your  healthcare provider if you have:   Shortness of breath or chest pain   A dizzy feeling when you get up from lying or sitting   Paler skin than normal   Extreme tiredness that is not helped by sleep  StayWell last reviewed this educational content on 08/14/2016   2000-2021 [employer]. All rights reserved. This information is not intended as a substitute for professional medical care. Always follow your healthcare professional's instructions.    Patient Education     Filgrastim, G-CSF injection  Brand Names: Neupogen, Nivestym, Zarxio  What is this medicine?  FILGRASTIM, G-CSF (fil GRA stim) is a granulocyte colony-stimulating factor that stimulates the growth of neutrophils, a type of white blood cell (WBC) important in the body's fight against infection. It is used to reduce the incidence of fever and infection in patients with certain types of cancer who are receiving chemotherapy that affects the bone marrow, to stimulate blood cell production for removal of WBCs from the body prior to a bone marrow transplantation, to reduce the incidence of fever and infection in patients who have severe chronic neutropenia, and to improve survival outcomes following high-dose radiation exposure that is toxic to the bone marrow.  How should I use this  medicine?  This medicine is for injection under the skin or infusion into a vein. As an infusion into a vein, it is usually given by a health care professional in a hospital or clinic setting. If you get this medicine at home, you will be taught how to prepare and give this medicine. Refer to the Instructions for Use that come with your medication packaging. Use exactly as directed. Take your medicine at regular intervals. Do not take your medicine more often than directed.  It is important that you put your used needles and syringes in a special sharps container. Do not put them in a trash can. If you do not have a sharps container, call your pharmacist or  healthcare provider to get one.  Talk to your pediatrician regarding the use of this medicine in children. While this drug may be prescribed for children as young as 7 months for selected conditions, precautions do apply.  What side effects may I notice from receiving this medicine?  Side effects that you should report to your doctor or health care professional as soon as possible:   allergic reactions like skin rash, itching or hives, swelling of the face, lips, or tongue   back pain   dizziness or feeling faint   fever   pain, redness, or irritation at site where injected   pinpoint red spots on the skin   shortness of breath or breathing problems   signs and symptoms of kidney injury like trouble passing urine, change in the amount of urine, or red or dark-brown urine   stomach or side pain, or pain at the shoulder   swelling   tiredness   unusual bleeding or bruising  Side effects that usually do not require medical attention (report to your doctor or health care professional if they continue or are bothersome):   bone pain   cough   diarrhea   hair loss   headache   muscle pain  What may interact with this medicine?  This medicine may interact with the following medications:   medicines that may cause a release of neutrophils, such as lithium  What if I miss a dose?  It is important not to miss your dose. Call your doctor or health care professional if you miss a dose.  Where should I keep my medicine?  Keep out of the reach of children.  Store in a refrigerator between 2 and 8 degrees C (36 and 46 degrees F). Do not freeze. Keep in carton to protect from light. Throw away this medicine if vials or syringes are left out of the refrigerator for more than 24 hours. Throw away any unused medicine after the expiration date.  What should I tell my health care provider before I take this medicine?  They need to know if you have any of these conditions:   kidney disease   latex allergy   ongoing  radiation therapy   sickle cell disease   an unusual or allergic reaction to filgrastim, pegfilgrastim, other medicines, foods, dyes, or preservatives   pregnant or trying to get pregnant   breast-feeding  What should I watch for while using this medicine?  You may need blood work done while you are taking this medicine.  NOTE:This sheet is a summary. It may not cover all possible information. If you have questions about this medicine, talk to your doctor, pharmacist, or health care provider. Copyright 2021 [employer]

## 2021-01-10 NOTE — Nurses Notes (Signed)
Creatinine clearance is 41.2896.  Corrected calcium is 9.74.

## 2021-01-11 ENCOUNTER — Other Ambulatory Visit: Payer: Self-pay

## 2021-01-11 ENCOUNTER — Inpatient Hospital Stay (HOSPITAL_COMMUNITY)
Admission: RE | Admit: 2021-01-11 | Discharge: 2021-01-11 | Disposition: A | Discharge status: Still In Hospital | Payer: Medicare Other | Source: Ambulatory Visit | Attending: Hematology & Oncology | Admitting: Hematology & Oncology

## 2021-01-11 DIAGNOSIS — D61811 Other drug-induced pancytopenia: Secondary | ICD-10-CM

## 2021-01-11 DIAGNOSIS — D708 Other neutropenia: Secondary | ICD-10-CM

## 2021-01-11 DIAGNOSIS — C3490 Malignant neoplasm of unspecified part of unspecified bronchus or lung: Secondary | ICD-10-CM

## 2021-01-11 MED ORDER — FILGRASTIM-SNDZ 300 MCG/0.5 ML INJECTION SYRINGE
300.0000 ug | INJECTION | Freq: Once | INTRAMUSCULAR | Status: AC
Start: 2021-01-11 — End: 2021-01-11
  Administered 2021-01-11: 300 ug via SUBCUTANEOUS
  Filled 2021-01-11: qty 0.5

## 2021-01-11 NOTE — Discharge Instructions (Signed)
Patient Education     Understanding Filgrastim  Your healthcare provider has prescribed the medicine filgrastim. This medicine is a man-made version of a protein called granulocyte colony-stimulating factor (G-CSF). Your body makes G-CSF to tell the bone marrow to make more white blood cells.   Filgrastim is not a cancer treatment. It's a supportive therapy. It's used to raise the number of neutrophils in your blood. A neutrophil is a type of infection-fighting white blood cell. You're getting this medicine because your last chemotherapy (chemo) treatment caused your neutrophil count (ANC, or absolute neutrophil count) to drop. Your ANC is measured with blood tests that are done after chemo.   Filgrastim costs a lot of money. Talk with your provider to find out if your insurance will pay for it and how much you'll have to pay. There are many different brands of filgrastim. You may have to get the brand your insurance covers.   Filgrastim is given as a shot. Your healthcare provider or pharmacist can give you more information about it.   This sheet will also help you learn more about filgrastim.     What filgrastim can do for you   This medicine can:   Make you less likely to get an infection   Make it safer for you to be around other people, and as a result, you can do more   Help prevent illness that could cause a delay or change in your treatment  How it works   Certain kinds of chemo reduce the number of neutrophils in your blood. As the number of these cells go down, you are less able to fight infection. Filgrastim helps your bone marrow make these blood cells and push them out into your blood faster.   About 24 hours after chemo, you'll get your first filgrastim shot. You'll get a shot every day until your blood counts reach a certain level. Talk with your healthcare provider about this level and how long you may need to get filgrastim.   Coping with side effects   The most common side effects are:    Bone,  joint, and muscle pain   Mild fever   Redness, swelling, or itching where the shot is given    These symptoms can often be eased with a heating pad and certain medicines. Check with your healthcare provider before you take any medicine for filgrastim side effects.   Other rare side effects include:    Headache   Pain in the lower back or pelvis   Skin rash or itching   Tiredness   Cough   Shortness of breath   Dizziness   Nausea    Tell your healthcare provider about any changes you notice. They can tell you what to do to manage or even prevent treatment side effects.   When should I call my healthcare provider?  Call your healthcare provider right away if any of these occur:    Possible signs of infection, such as:  ? Fever  ? Chills  ? Sore throat  ? Diarrhea  ? Redness at the site of a wound or sore  ? Burning when passing urine or changes in how your urine looks or smells   Pain in your left upper stomach or left shoulder area(This could be a sign of an enlarged spleen, a rare side effect of this treatment.)   Easy bruising and bleeding   Shortness of breath, wheezing, dizziness, swelling around the mouth or eyes, quick pulse, or  sweating   Redness, swelling, or itchingat the site of the shot  Talk with your healthcare providers about what symptoms to watch for and when to call them. Know what number to call with questions or problems. Is there a different number to call when the clinic is closed on evenings, weekends, and holidays?   StayWell last reviewed this educational content on 11/15/2019   2000-2021 [employer]. All rights reserved. This information is not intended as a substitute for professional medical care. Always follow your healthcare professional's instructions.

## 2021-01-11 NOTE — Nurses Notes (Signed)
Patient discharged home.  AVS reviewed with patient.  A written copy of the AVS and discharge instructions was given to the patient.  Questions sufficiently answered as needed.  Patient encouraged to follow up with PCP as indicated.  In the event of an emergency, patient instructed to call 911 or go to the nearest emergency room.

## 2021-01-12 ENCOUNTER — Inpatient Hospital Stay (HOSPITAL_COMMUNITY)
Admission: RE | Admit: 2021-01-12 | Discharge: 2021-01-12 | Disposition: A | Discharge status: Still In Hospital | Payer: Medicare Other | Source: Ambulatory Visit | Attending: Hematology & Oncology | Admitting: Hematology & Oncology

## 2021-01-12 ENCOUNTER — Encounter (HOSPITAL_COMMUNITY): Payer: Self-pay | Admitting: Hematology & Oncology

## 2021-01-12 DIAGNOSIS — D61811 Other drug-induced pancytopenia: Secondary | ICD-10-CM

## 2021-01-12 DIAGNOSIS — D708 Other neutropenia: Secondary | ICD-10-CM

## 2021-01-12 DIAGNOSIS — C3490 Malignant neoplasm of unspecified part of unspecified bronchus or lung: Secondary | ICD-10-CM

## 2021-01-12 MED ORDER — FILGRASTIM-SNDZ 300 MCG/0.5 ML INJECTION SYRINGE
300.0000 ug | INJECTION | Freq: Once | INTRAMUSCULAR | Status: AC
Start: 2021-01-12 — End: 2021-01-12
  Administered 2021-01-12: 300 ug via SUBCUTANEOUS
  Filled 2021-01-12 (×2): qty 0.5

## 2021-01-13 ENCOUNTER — Inpatient Hospital Stay (HOSPITAL_COMMUNITY)
Admission: RE | Admit: 2021-01-13 | Discharge: 2021-01-13 | Disposition: A | Discharge status: Still In Hospital | Payer: Medicare Other | Source: Ambulatory Visit | Attending: Hematology & Oncology | Admitting: Hematology & Oncology

## 2021-01-13 DIAGNOSIS — D708 Other neutropenia: Secondary | ICD-10-CM

## 2021-01-13 DIAGNOSIS — C3490 Malignant neoplasm of unspecified part of unspecified bronchus or lung: Secondary | ICD-10-CM

## 2021-01-13 DIAGNOSIS — D61811 Other drug-induced pancytopenia: Secondary | ICD-10-CM

## 2021-01-13 MED ORDER — FILGRASTIM-SNDZ 300 MCG/0.5 ML INJECTION SYRINGE
300.0000 ug | INJECTION | Freq: Once | INTRAMUSCULAR | Status: AC
Start: 2021-01-13 — End: 2021-01-13
  Administered 2021-01-13: 300 ug via SUBCUTANEOUS
  Filled 2021-01-13 (×2): qty 0.5

## 2021-01-19 ENCOUNTER — Other Ambulatory Visit (HOSPITAL_COMMUNITY): Payer: Self-pay | Admitting: Hematology & Oncology

## 2021-01-19 DIAGNOSIS — R630 Anorexia: Secondary | ICD-10-CM

## 2021-01-21 ENCOUNTER — Other Ambulatory Visit: Payer: Self-pay

## 2021-01-21 ENCOUNTER — Ambulatory Visit: Payer: Medicare Other | Attending: Hematology & Oncology | Admitting: Hematology & Oncology

## 2021-01-21 ENCOUNTER — Encounter (HOSPITAL_COMMUNITY): Payer: Self-pay | Admitting: Hematology & Oncology

## 2021-01-21 ENCOUNTER — Other Ambulatory Visit (HOSPITAL_COMMUNITY): Payer: Self-pay | Admitting: Hematology & Oncology

## 2021-01-21 DIAGNOSIS — C801 Malignant (primary) neoplasm, unspecified: Secondary | ICD-10-CM

## 2021-01-21 DIAGNOSIS — C7951 Secondary malignant neoplasm of bone: Secondary | ICD-10-CM

## 2021-01-21 DIAGNOSIS — C3490 Malignant neoplasm of unspecified part of unspecified bronchus or lung: Secondary | ICD-10-CM | POA: Insufficient documentation

## 2021-01-21 NOTE — Progress Notes (Signed)
HEMATOLOGY/ONCOLOGY, [place]  [place] [address]  Operated by [facility]  Telephone Visit    Name:  Gina Barrett MRN: [id number]   Date:  01/21/2021 Age:   65 y.o.     The patient/family initiated a request for telephone service.  Verbal consent for this service was obtained from the patient/family.    Last office visit in this department: 12/18/2020      Reason for call: Lung Cancer  Call notes:  Pt continues to "do well" at home.  I reviewed her CT below with pt and husband.  Recommended continuing palliative therapy.     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      TECHNIQUE:  Chest, abdomen and pelvis CT with intravenous contrast.  CONTRAST:  59 ml's of Isovue 370    COMPARISON:  07/02/2020  # of known CTs in the past 12 months: 2   # of known Cardiac Nuclear Medicine Studies in the past 12 months: 0    FINDINGS:  CT CHEST:  Hardware:  There is a right sided vascular port in the SVC near the cavoatrial junction.    Lymph nodes:   There are 2 lymph nodes in the left prevascular space which abut each other. Both of these have low-attenuation centers suggesting some central necrosis. These both measure less than 1 cm diameter and the largest of these which is anterior measures about 0.7 cm in diameter which is decreased from 1.1 cm in diameter on 07/02/2020 representing some interval decrease in size. No new lymphadenopathy is seen.    Heart and Vasculature:  Coronary artery calcifications are noted. Thoracic aorta and  pulmonary arteries are unremarkable.      Lungs and Airways:  There is some pleural-based density over the right lung apex which extends into the right hilar region with some upward traction of the right hilum. This was present on 07/02/2020. This measures about 2.1 cm in thickness in the craniocaudad dimension and is stable since 07/02/2020. This may represent posttreatment changes.    In the left upper lobe on axial image #27/88, there is a 14 mm pleural-based density which was present on 07/02/2020 and is slightly smaller since that time.    In the left lower lobe on axial image #49/88, there is a peripheral density which is pleural-based and measures about 4.5 cm x 2.2 cm in size. This previous measured 4.5 cm x 2.4 cm in diameter and is either stable or minimally decreased in size.     There are some left lower lobe lung nodules which have some some improvement since 07/02/2020 with decrease in size and number. This includes multiple subpleural nodules studding the peripheral pleural surface of the left lower lobe.     There are some peripheral opacities in the right lower lobe which are nonspecific as to the etiology.  There is some thickening along the left major fissure with some adjacent pleural-based densities in the lingular segment of the left upper lobe. The thickening along the major fissure has decreased in size since 07/02/2020.    Pleura: No pleural effusion.  No pneumothorax.    Bones: There is a T6 vertebral body fracture unchanged from 07/02/2020.  There is a sclerotic lesion in the T1 vertebral body surrounding a lucent lesion. The lucent portion is unchanged from 07/02/2020. The surrounding sclerosis is new which may be due to some healing of a metastatic deposit.    There is a sclerotic lesion in the sternum which is more prominent than on 07/02/2020.    There is a previous healed right ninth rib fracture. There are a few other sclerotic rib lesions.      CT ABDOMEN/PELVIS:  Liver:    Unremarkable.    Gallbladder:   Unremarkable.    Spleen:   Unremarkable.    Pancreas:   Unremarkable.    Adrenals:   Unremarkable.    Kidneys:   Unremarkable.    Bladder:  Unremarkable.    Uterus and Adnexa:  Unremarkable.    Bowel:   Unremarkable.    Appendix:  The appendix is not identified.  There is no inflammatory process identified in the right lower quadrant to suggest appendicitis.    Lymph nodes:  No suspicious lymph node enlargement.    Vasculature:   Mild diffuse atherosclerotic calcifications are noted.     Peritoneum / Retroperitoneum: No ascites.  No free air.    Bones:   There is a new sclerotic lesion in the S1 vertebrae compared to 07/02/2020. There is some sclerosis involving the inferior endplate of L5 which is new since 07/02/2020. There is a new sclerotic lesion involving the inferior endplate of the Z30 vertebral body and a new small sclerotic lesion in the L2 vertebral body.    There are mixed sclerotic and lytic lesions present throughout the bony pelvis involving the pubic bones, bilateral acetabulum,/ischium, and in the iliac bones. These sclerotic lesions and sclerotic areas are new since 07/02/2020. The corresponding lytic lesions were previously present and show no interval increase in size. The sclerotic components may be due to healing of previous lytic metastatic deposits        IMPRESSION:  1.THERE ARE COUPLE SMALL MEDIASTINAL LYMPH NODES WHICH MEASURE SLIGHTLY SMALLER 07/02/2020 WITH NO NEW MEDIASTINAL OR HILAR LYMPHADENOPATHY  2.THERE ARE MULTIPLE LUNG NODULES AND PLEURAL-BASED DENSITIES IN THE LUNGS LEFT WORSE THAN RIGHT SOME OF WHICH ARE STABLE AND OTHERS OF WHICH ARE IMPROVED SINCE 07/02/2020. NO NEW OR WORSENED LUNG NODULES ARE SEEN.  3.WIDESPREAD METASTATIC BONE DISEASE. THERE ARE SOME NEW SCLEROTIC LESIONS WHICH WERE NOT PREVIOUSLY PRESENT. PREVIOUSLY IDENTIFIED LYTIC LESIONS IN THE BONY PELVIS INVOLVING THE ACETABULA BILATERALLY/PUBIC BONES, ILIAC BONES, ISCHIUM  ARE PRESENT BUT SHOW SOME SCLEROTIC CHANGES SUGGESTING A HEALING COMPONENT THE PRIOR METASTATIC LESIONS.  4.NO NEW LYMPHADENOPATHY IN THE ABDOMEN OR PELVIS      One or more dose reduction techniques were used (e.g., Automated exposure control, adjustment of the mA and/or kV according to patient size, use of iterative reconstruction technique).      Radiologist location ID: [id number]           No diagnosis found.    Total provider time spent with the patient on the phone: 20 minutes.    Mertie Moores, MD

## 2021-01-29 ENCOUNTER — Other Ambulatory Visit (HOSPITAL_COMMUNITY): Payer: Self-pay | Admitting: Hematology & Oncology

## 2021-01-29 ENCOUNTER — Encounter (HOSPITAL_COMMUNITY): Payer: Self-pay

## 2021-01-29 DIAGNOSIS — M7989 Other specified soft tissue disorders: Secondary | ICD-10-CM

## 2021-01-29 NOTE — Nursing Note (Signed)
This nurse received voicemail from patient inquiring about status of Lower Extremity Ultrasound.  Message sent to patients provider requesting update.

## 2021-01-30 ENCOUNTER — Encounter (HOSPITAL_COMMUNITY): Payer: Self-pay | Admitting: Hematology & Oncology

## 2021-01-30 ENCOUNTER — Other Ambulatory Visit (HOSPITAL_COMMUNITY): Payer: Self-pay | Admitting: Hematology & Oncology

## 2021-01-31 ENCOUNTER — Other Ambulatory Visit: Payer: Self-pay

## 2021-01-31 ENCOUNTER — Other Ambulatory Visit (HOSPITAL_COMMUNITY): Payer: Self-pay

## 2021-01-31 ENCOUNTER — Inpatient Hospital Stay
Admission: RE | Admit: 2021-01-31 | Discharge: 2021-01-31 | Disposition: A | Discharge status: Still In Hospital | Payer: Medicare Other | Source: Ambulatory Visit | Attending: Hematology & Oncology | Admitting: Hematology & Oncology

## 2021-01-31 VITALS — BP 125/64 | HR 85 | Temp 98.2°F | Resp 20 | Ht 60.98 in | Wt 102.2 lb

## 2021-01-31 DIAGNOSIS — D649 Anemia, unspecified: Secondary | ICD-10-CM | POA: Insufficient documentation

## 2021-01-31 DIAGNOSIS — Z95828 Presence of other vascular implants and grafts: Secondary | ICD-10-CM

## 2021-01-31 DIAGNOSIS — C3490 Malignant neoplasm of unspecified part of unspecified bronchus or lung: Secondary | ICD-10-CM

## 2021-01-31 LAB — COMPREHENSIVE METABOLIC PANEL, NON-FASTING
ALBUMIN: 3.3 g/dL — ABNORMAL LOW (ref 3.4–4.8)
ALKALINE PHOSPHATASE: 70 U/L (ref 55–145)
ALT (SGPT): 16 U/L (ref 8–22)
ANION GAP: 9 mmol/L (ref 4–13)
AST (SGOT): 25 U/L (ref 8–45)
BILIRUBIN TOTAL: 0.3 mg/dL (ref 0.3–1.3)
BUN/CREA RATIO: 15 (ref 6–22)
BUN: 14 mg/dL (ref 8–25)
CALCIUM: 9.1 mg/dL (ref 8.8–10.2)
CHLORIDE: 109 mmol/L (ref 96–111)
CO2 TOTAL: 22 mmol/L — ABNORMAL LOW (ref 23–31)
CREATININE: 0.93 mg/dL (ref 0.60–1.05)
ESTIMATED GFR: 68 mL/min/BSA (ref >=60)
GLUCOSE: 89 mg/dL (ref 65–125)
POTASSIUM: 4.3 mmol/L (ref 3.5–5.1)
PROTEIN TOTAL: 6.8 g/dL (ref 6.0–8.0)
SODIUM: 140 mmol/L (ref 136–145)

## 2021-01-31 LAB — MANUAL DIFF AND MORPHOLOGY-SYSMEX
BASOPHIL #: <0.1 10*3/uL (ref <=0.20)
BASOPHIL %: 0 %
EOSINOPHIL #: <0.1 10*3/uL (ref <=0.50)
EOSINOPHIL %: 0 %
LYMPHOCYTE #: 0.64 10*3/uL — ABNORMAL LOW (ref 1.00–4.80)
LYMPHOCYTE %: 22 %
METAMYELOCYTE %: 5 %
MONOCYTE #: 0.87 10*3/uL (ref 0.20–1.10)
MONOCYTE %: 30 %
MYELOCYTE %: 1 %
NEUTROPHIL #: 1.22 10*3/uL — ABNORMAL LOW (ref 1.50–7.70)
NEUTROPHIL %: 37 %
NEUTROPHIL BANDS %: 5 %

## 2021-01-31 LAB — CBC WITH DIFF
HCT: 21.8 % — ABNORMAL LOW (ref 34.8–46.0)
HGB: 6.7 g/dL — CL (ref 11.5–16.0)
MCH: 32.7 pg — ABNORMAL HIGH (ref 26.0–32.0)
MCHC: 30.7 g/dL — ABNORMAL LOW (ref 31.0–35.5)
MCV: 106.3 fL — ABNORMAL HIGH (ref 78.0–100.0)
MPV: 9.5 fL (ref 8.7–12.5)
PLATELETS: 259 10*3/uL (ref 150–400)
RBC: 2.05 10*6/uL — ABNORMAL LOW (ref 3.85–5.22)
RDW-CV: 18.5 % — ABNORMAL HIGH (ref 11.5–15.5)
WBC: 2.9 10*3/uL — ABNORMAL LOW (ref 3.7–11.0)

## 2021-01-31 LAB — BPAM PACKED CELL ORDER

## 2021-01-31 LAB — TYPE AND CROSS RED CELLS - UNITS
ABO/RH(D): O POS
UNITS ORDERED: 1

## 2021-01-31 LAB — ABO & RH: ABO/RH(D): O POS

## 2021-01-31 LAB — MAGNESIUM: MAGNESIUM: 1.8 mg/dL (ref 1.8–2.6)

## 2021-01-31 MED ORDER — HEPARIN, PORCINE (PF) 100 UNIT/ML INTRAVENOUS SYRINGE
5.0000 mL | INJECTION | Freq: Once | INTRAVENOUS | Status: AC
Start: 2021-01-31 — End: 2021-01-31
  Administered 2021-01-31 (×2): 5 mL
  Filled 2021-01-31: qty 5

## 2021-01-31 MED ORDER — SODIUM CHLORIDE 0.9 % IV BOLUS
40.0000 mL | INJECTION | Freq: Once | Status: AC | PRN
Start: 2021-01-31 — End: 2021-01-31

## 2021-01-31 NOTE — Discharge Instructions (Signed)
Patient Education     Blood Transfusion (Adult): Overview  A blood transfusion may be done when you have lost blood because of an injury or during surgery. It can also be done because of diseases or conditions that affect the blood. Blood is made up of several different parts (blood products). You may get some or all of these blood products during a transfusion. Blood for transfusion is usually donated from another person (donor). Strict measures are taken to make sure that donated blood is safe before it's given to you. This sheet helps you understand how a blood transfusion is done. Your healthcare provider will discuss your condition with you and answer your questions.   The parts of blood  Blood can be broken down into different parts that have special roles in the body. These parts include:    Red blood cells, which carry oxygen throughout the body.   Platelets, which help stop bleeding.   Plasma (the liquid part of blood), which carries red blood cells and platelets throughout the body. Plasma also helps platelets in stopping bleeding.  Where does donated blood come from?   Volunteer donors. These are people who donate their blood to help others in need of blood. Blood donation can take place at several places, including a hospital, blood bank, or during a blood drive.   Directed donation. If you need a blood transfusion during a planned surgery, family and friends can have their blood tested for compatibility and donate blood for you before the surgery. This needs to be done at least 7 days in advance. This is because the blood must be tested for safety, just as blood from volunteer donors is tested. Blood from directed donors has not been shown to be safer than blood from volunteer donors. Talk with your healthcare provider about directed donation.   Autologous donation. This is also called self-donation. For planned surgery, you can donate your own blood starting up to 6 weeks before surgery. Talk  with your healthcare provider about self-donation to see if it's an option for you.    Are blood transfusions safe?  Donated blood is tested and processed to make sure that it's safe:    The health history of each donor is carefully screened. If a person is considered high-risk for infection or problems, they aren't accepted as a blood donor.   Donated blood is tested for infections such as hepatitis, syphilis, and HIV (the virus that causes AIDS). If the tested blood is found to be unsafe, it's destroyed.   Blood is divided into four types: A, B, AB, and O. Blood also has Rh types: positive (+) and negative (-). You can only get blood products that are compatible with (match) your blood type. A sample of your blood is tested for compatibility with donated blood. This is done before blood products are prepared for a transfusion.  How is a blood transfusion done?  A blood transfusion takes place in a blood center, infusion center, hospital room, or operating room. Your healthcare provider will discuss the blood transfusion with you before it's done. You'll need to give permission for the blood transfusion by signing a consent form.    Two healthcare providers confirm your identity. They also confirm that they have the correct blood products for you.   An IV (intravenous) line is placed in a vein if you don't already have an IV.   The blood product comes in a plastic bag that is hung on an IV  pole. The blood product flows from the bag into your IV line. The IV line may be connected to a pump, which controls the transfusion rate. You may get more than one kind of blood product through the IV.   Your blood pressure, heart rate, respiratory rate, and temperature are checked throughout the transfusion. This is to make sure you are not having a reaction to the blood product.   The IV line may be removed once the transfusion is complete.  Risks and possible complications of blood transfusions   Most transfusions are  problem free. In some cases, reactions occur. Most are mild. Rarely, serious and life-threatening reactions occur. These can happen within seconds to minutes during the transfusion or a week to a few months after the transfusion. Call your healthcare provider right away if you have any of the symptoms in the table below. In some cases, you may be told to go to the nearest emergency room.   Reaction  Timing  Symptoms    Anaphylactic reaction (serious allergic reaction)      Within seconds to minutes during the transfusion   Up to 24 hours after the transfusion Call 911 if you have:    Shortness of breath and labored (working hard) breathing   Wheezing   Flushing (red face)   Hives   Low blood pressure   Fast pulse   Chest tightness   Swelling of your lips, tongue, or throat   Bacterial infection (sepsis) May happen 30 minutes or more after the transfusion  Fever, shaking chills, fast heartbeat, and low or high blood pressure    Allergic reaction (mild)      Within seconds to minutes during the transfusion   Up to 24 hours after the transfusion Hives or red welts on the skin, mild itching, rash, localized swelling, flushing (red face), wheezing, shortness of breath, or stridor (high-pitched noise or sound)    Febrile nonhemolytic reaction      Within minutes to hours during the transfusion   Within a few hours to 24 hours after the transfusion Fever (100.43F or 38C or higher), chills, flushing (red face), nausea, headache, minor discomfort, or mild shortness of breath    Acute immune hemolytic reaction      Within minutes during the transfusion   Up to 24 hours after the transfusion Fever, red or brown urine, back pain, fast heart rate (tachycardia), abdominal pain, low blood pressure, feeling anxious, chills, chest pain, nausea, or fainting spells    Transfusion-related acute lung injury (TRALI)      Within 1 to 2 hours during the transfusion   Up to 6 hours after the transfusion Shortness of  breath, trouble breathing, low blood pressure, fever, pulmonary edema    Transfusion-associated circulatory overload     Near the end of the transfusion   Within 6 hours after the transfusion Shortness of breath, fast heart rate (tachycardia), problems breathing when lying on back, abnormal blood pressure    Post-transfusion purpura (PUP)      Within 1 week   Up to 48 days after the transfusion Purple spots on skin; nose bleed; bleeding from the urinary tract, abdomen, colon, or rectum; fever; or chills    Delayed transfusion-related acute lung injury (TRALI)      Within 72 hours (3 days) after the transfusion Sudden onset of respiratory distress or trouble breathing    Delayed hemolytic reaction      Within 3 to 7 days   Up to  weeks after the transfusion Low-grade fever, mild jaundice (yellowing of the skin and whites of the eyes), decrease in hematocrit, chills, chest pain, back pain, nausea    [employer] last reviewed this educational content on 11/15/2019   2000-2021 [employer]. All rights reserved. This information is not intended as a substitute for professional medical care. Always follow your healthcare professional's instructions.

## 2021-01-31 NOTE — Nurses Notes (Signed)
Pt to lobby via [place]. Patient discharged home with family.  AVS reviewed with patient/care giver.  A written copy of the AVS and discharge instructions was given to the patient/care giver.  Questions sufficiently answered as needed.  Patient/care giver encouraged to follow up with PCP as indicated.  In the event of an emergency, patient/care giver instructed to call 911 or go to the nearest emergency room.

## 2021-02-01 ENCOUNTER — Inpatient Hospital Stay (HOSPITAL_COMMUNITY): Payer: Medicare Other

## 2021-02-02 ENCOUNTER — Inpatient Hospital Stay (HOSPITAL_COMMUNITY): Payer: Medicare Other

## 2021-02-02 LAB — BPAM PACKED CELL ORDER: UNIT DIVISION: 0

## 2021-02-02 LAB — TYPE AND CROSS RED CELLS - UNITS: ANTIBODY SCREEN: NEGATIVE

## 2021-02-03 ENCOUNTER — Inpatient Hospital Stay (HOSPITAL_COMMUNITY): Payer: Medicare Other

## 2021-02-04 ENCOUNTER — Other Ambulatory Visit (HOSPITAL_COMMUNITY): Payer: Self-pay | Admitting: Hematology & Oncology

## 2021-02-04 ENCOUNTER — Inpatient Hospital Stay (HOSPITAL_COMMUNITY): Payer: Medicare Other

## 2021-02-04 DIAGNOSIS — C801 Malignant (primary) neoplasm, unspecified: Secondary | ICD-10-CM

## 2021-02-04 DIAGNOSIS — G893 Neoplasm related pain (acute) (chronic): Secondary | ICD-10-CM

## 2021-02-04 DIAGNOSIS — C3491 Malignant neoplasm of unspecified part of right bronchus or lung: Secondary | ICD-10-CM

## 2021-02-04 MED ORDER — OXYCODONE-ACETAMINOPHEN 10 MG-325 MG TABLET
1.0000 | ORAL_TABLET | Freq: Four times a day (QID) | ORAL | 0 refills | Status: DC | PRN
Start: 2021-02-04 — End: 2021-03-09

## 2021-02-05 ENCOUNTER — Inpatient Hospital Stay (HOSPITAL_COMMUNITY): Payer: Medicare Other

## 2021-02-06 ENCOUNTER — Other Ambulatory Visit (INDEPENDENT_AMBULATORY_CARE_PROVIDER_SITE_OTHER): Payer: Self-pay | Admitting: Family

## 2021-02-06 ENCOUNTER — Encounter (HOSPITAL_COMMUNITY): Payer: Self-pay | Admitting: Hematology & Oncology

## 2021-02-07 ENCOUNTER — Other Ambulatory Visit (INDEPENDENT_AMBULATORY_CARE_PROVIDER_SITE_OTHER): Payer: Self-pay | Admitting: Family

## 2021-02-07 ENCOUNTER — Inpatient Hospital Stay (HOSPITAL_COMMUNITY): Payer: Medicare Other

## 2021-02-07 ENCOUNTER — Other Ambulatory Visit: Payer: Self-pay

## 2021-02-07 ENCOUNTER — Inpatient Hospital Stay (HOSPITAL_COMMUNITY)
Admission: RE | Admit: 2021-02-07 | Discharge: 2021-02-07 | Disposition: A | Discharge status: Still In Hospital | Payer: Medicare Other | Source: Ambulatory Visit | Attending: Hematology & Oncology | Admitting: Hematology & Oncology

## 2021-02-07 VITALS — BP 122/67 | HR 100 | Temp 98.3°F | Resp 20 | Ht 60.98 in | Wt 101.6 lb

## 2021-02-07 DIAGNOSIS — D708 Other neutropenia: Secondary | ICD-10-CM | POA: Insufficient documentation

## 2021-02-07 DIAGNOSIS — Z5189 Encounter for other specified aftercare: Secondary | ICD-10-CM | POA: Insufficient documentation

## 2021-02-07 DIAGNOSIS — D61811 Other drug-induced pancytopenia: Secondary | ICD-10-CM | POA: Insufficient documentation

## 2021-02-07 DIAGNOSIS — C7951 Secondary malignant neoplasm of bone: Secondary | ICD-10-CM | POA: Insufficient documentation

## 2021-02-07 DIAGNOSIS — C3491 Malignant neoplasm of unspecified part of right bronchus or lung: Secondary | ICD-10-CM

## 2021-02-07 DIAGNOSIS — Z5111 Encounter for antineoplastic chemotherapy: Secondary | ICD-10-CM | POA: Insufficient documentation

## 2021-02-07 DIAGNOSIS — D649 Anemia, unspecified: Secondary | ICD-10-CM

## 2021-02-07 DIAGNOSIS — C3490 Malignant neoplasm of unspecified part of unspecified bronchus or lung: Secondary | ICD-10-CM

## 2021-02-07 LAB — CBC W/AUTO DIFF
HCT: 31.3 % — ABNORMAL LOW (ref 34.8–46.0)
HGB: 10 g/dL — ABNORMAL LOW (ref 11.5–16.0)
MCH: 32.8 pg — ABNORMAL HIGH (ref 26.0–32.0)
MCHC: 31.9 g/dL (ref 31.0–35.5)
MCV: 102.6 fL — ABNORMAL HIGH (ref 78.0–100.0)
MPV: 9.8 fL (ref 8.7–12.5)
PLATELETS: 255 10*3/uL (ref 150–400)
RBC: 3.05 10*6/uL — ABNORMAL LOW (ref 3.85–5.22)
RDW-CV: 16 % — ABNORMAL HIGH (ref 11.5–15.5)
WBC: 4.5 10*3/uL (ref 3.7–11.0)

## 2021-02-07 LAB — MANUAL DIFF AND MORPHOLOGY-SYSMEX
BASOPHIL #: <0.1 10*3/uL (ref <=0.20)
BASOPHIL %: 0 %
EOSINOPHIL #: <0.1 10*3/uL (ref <=0.50)
EOSINOPHIL %: 0 %
LYMPHOCYTE #: 0.32 10*3/uL — ABNORMAL LOW (ref 1.00–4.80)
LYMPHOCYTE %: 7 %
METAMYELOCYTE %: 5 %
MONOCYTE #: 1.04 10*3/uL (ref 0.20–1.10)
MONOCYTE %: 23 %
MYELOCYTE %: 2 %
NEUTROPHIL #: 2.84 10*3/uL (ref 1.50–7.70)
NEUTROPHIL %: 51 %
NEUTROPHIL BANDS %: 12 %

## 2021-02-07 MED ORDER — SODIUM CHLORIDE 0.9 % INTRAVENOUS SOLUTION
150.0000 mg | Freq: Once | INTRAVENOUS | Status: AC
Start: 2021-02-07 — End: 2021-02-07
  Administered 2021-02-07: 0 mg via INTRAVENOUS
  Administered 2021-02-07: 150 mg via INTRAVENOUS
  Filled 2021-02-07: qty 5

## 2021-02-07 MED ORDER — SODIUM CHLORIDE 0.9 % (FLUSH) INJECTION SYRINGE
10.0000 mL | INJECTION | Freq: Once | INTRAMUSCULAR | Status: AC
Start: 2021-02-07 — End: 2021-02-07
  Administered 2021-02-07: 10 mL

## 2021-02-07 MED ORDER — PALONOSETRON 0.25 MG/5 ML INTRAVENOUS SOLUTION
0.2500 mg | Freq: Once | INTRAVENOUS | Status: AC
Start: 2021-02-07 — End: 2021-02-07
  Administered 2021-02-07: 0.25 mg via INTRAVENOUS
  Filled 2021-02-07: qty 5

## 2021-02-07 MED ORDER — SODIUM CHLORIDE 0.9 % INTRAVENOUS SOLUTION
268.8000 mg | Freq: Once | INTRAVENOUS | Status: AC
Start: 2021-02-07 — End: 2021-02-07
  Administered 2021-02-07: 270 mg via INTRAVENOUS
  Administered 2021-02-07: 0 mg via INTRAVENOUS
  Filled 2021-02-07: qty 27

## 2021-02-07 MED ORDER — SODIUM CHLORIDE 0.9 % INTRAVENOUS SOLUTION
12.0000 mg | Freq: Once | INTRAVENOUS | Status: AC
Start: 2021-02-07 — End: 2021-02-07
  Administered 2021-02-07: 12 mg via INTRAVENOUS
  Administered 2021-02-07: 0 mg via INTRAVENOUS
  Administered 2021-02-07: 12 mg via INTRAVENOUS
  Filled 2021-02-07: qty 3

## 2021-02-07 MED ORDER — SODIUM CHLORIDE 0.9 % INTRAVENOUS SOLUTION
375.0000 mg/m2 | Freq: Once | INTRAVENOUS | Status: AC
Start: 2021-02-07 — End: 2021-02-07
  Administered 2021-02-07: 525 mg via INTRAVENOUS
  Administered 2021-02-07: 0 mg via INTRAVENOUS
  Filled 2021-02-07: qty 21

## 2021-02-07 MED ORDER — SODIUM CHLORIDE 0.9% FLUSH BAG - 250 ML
INTRAVENOUS | Status: DC | PRN
Start: 2021-02-07 — End: 2021-02-08

## 2021-02-07 MED ORDER — ZOLEDRONIC ACID 4 MG/5 ML INTRAVENOUS SOLUTION
3.3000 mg | Freq: Once | INTRAVENOUS | Status: AC
Start: 2021-02-07 — End: 2021-02-07
  Administered 2021-02-07: 0 mg via INTRAVENOUS
  Administered 2021-02-07: 3.3 mg via INTRAVENOUS
  Filled 2021-02-07: qty 4.13

## 2021-02-07 MED ORDER — HEPARIN, PORCINE (PF) 100 UNIT/ML INTRAVENOUS SYRINGE
5.0000 mL | INJECTION | Freq: Once | INTRAVENOUS | Status: AC
Start: 2021-02-07 — End: 2021-02-07
  Administered 2021-02-07: 5 mL
  Filled 2021-02-07: qty 5

## 2021-02-07 NOTE — Discharge Instructions (Signed)
Patient Education     Discharge Instructions for Chemotherapy  Your healthcare provider prescribed a type of medicine therapy for you called chemotherapy. It's also known as chemo. Chemo is used for many different types of illnesses, including cancer. There are many types of chemo. This sheet gives some general information on how you can take care ofyourself after your chemo. Talk with your healthcare provider about other details based on your own treatment plan.  Mouth care  You may get mouth sores, even if you follow all of your healthcare provider's instructions. Many people get mouth sores as a side effect of chemo. Here's what you can do to help prevent mouth sores:   Keep your mouth clean. Brush your teeth with a soft-bristle toothbrush after every meal.   Ask if you should use a toothpaste with fluoride. Or you may be told to brush your teeth with a mixture of 1 teaspoon of salt in 8 ounces of water.   Use an oral swab or special soft toothbrush if your gums bleed during brushing.   Don't use dental floss if it causes your gums to bleed.   Use any mouthwash given to you as directed.   If you can't tolerate regular methods, use salt and baking soda to clean your mouth. Mix1teaspoonof salt and1teaspoon of baking soda in 1 quart of warm water. Swish and spit.   If you wear dentures, you may be told to wear them only when you eat. Ask your healthcare provider. Clean your dentures twice a day. Soak them in antimicrobial solution when you aren't wearing them. Rinse your mouth after each meal.   Check your mouth and tonguefor white patches. Thismay bea sign of a type of yeast infection called thrush. This is a common side effect of chemo. Tell your healthcare provider if you get these patches. He or she can prescribe medicine to treat them.  Other self-care  Here's what else you can do:   Try to exercise. Exercise keeps you strong and keeps your heart and lungs active. Walking and yoga are good  types of exercise.   Keep clean. During chemo, your body can't fight infection very well. Take short baths or showers. Wash your hands before you eat and after going to the bathroom.   Avoid people who are sick with an illness you could catch. This includes people with colds, flu, measles, or chicken pox. This also includes people who have recently had a vaccine for any illness.   Let your healthcare provider know if your throat is sore. You may have an infection that needs treatment.   Be gentle to your skin. Use moisturizing soap. Chemo can make your skin dry. Apply lotion several times a day to help relieve dry skin. Don't take very hot or very cold showers or baths.   Don't be surprised if your chemo causes slight burns to your skin. This can happen most often on the hands and feet. Some medicines used in high doses cause this. Ask for a special cream to help relieve the burn and protect your skin.  Many people on chemo feel sick and find it hard to eat during treatment. Try these tips:   Eat small meals several times a day to keep your strength up.   Choose bland foods with little taste or smell if you are reacting strongly to food.   Be sure to cook all food fully. This kills bacteria and helps you prevent illness.   Eat foods that   are soft. Soft foods are less likely to cause stomach irritation.   Try to eat a variety of foods for a well-balanced diet. Drink plenty of fluids. Eat foods with fiber unless your healthcare provider says not to. Fiber can help to prevent constipation.  When to call your healthcare provider  Call your healthcare provider right away if you have any of the following:   Unexplained bleeding   Trouble concentrating   Ongoing fatigue   Shortness of breath, wheezing, trouble breathing, or bad cough   Rapid, irregular heartbeat, or chest pain   Dizziness, lightheadedness   Constant feeling of being cold   Hives oracut or rash that swells, turns red, feels hot or  painful, or begins to ooze   Burning when you urinate   Feverof100.4F (38C) orhigher, oras directed by your healthcare provider  [employer] last reviewed this educational content on 10/14/2017   2000-2021 [employer]. All rights reserved. This information is not intended as a substitute for professional medical care. Always follow your healthcare professional's instructions.

## 2021-02-07 NOTE — Nurses Notes (Signed)
Creatinine clearance is 43.3048.  Corrected calcium is 9.66.

## 2021-02-07 NOTE — Nurses Notes (Signed)
Patient discharged home.  AVS reviewed with patient.  A written copy of the AVS and discharge instructions was given to the patient.  Questions sufficiently answered as needed.  Patient encouraged to follow up with PCP as indicated.  In the event of an emergency, patient instructed to call 911 or go to the nearest emergency room.

## 2021-02-08 ENCOUNTER — Other Ambulatory Visit: Payer: Self-pay

## 2021-02-08 ENCOUNTER — Inpatient Hospital Stay (HOSPITAL_COMMUNITY): Admission: RE | Admit: 2021-02-08 | Payer: Medicare Other | Source: Ambulatory Visit

## 2021-02-08 ENCOUNTER — Encounter (HOSPITAL_COMMUNITY): Payer: Self-pay | Admitting: Hematology & Oncology

## 2021-02-08 ENCOUNTER — Inpatient Hospital Stay (HOSPITAL_COMMUNITY)
Admission: RE | Admit: 2021-02-08 | Discharge: 2021-02-08 | Disposition: A | Discharge status: Still In Hospital | Payer: Medicare Other | Source: Ambulatory Visit | Attending: Hematology & Oncology | Admitting: Hematology & Oncology

## 2021-02-08 DIAGNOSIS — D708 Other neutropenia: Secondary | ICD-10-CM

## 2021-02-08 DIAGNOSIS — C3490 Malignant neoplasm of unspecified part of unspecified bronchus or lung: Secondary | ICD-10-CM

## 2021-02-08 DIAGNOSIS — D61811 Other drug-induced pancytopenia: Secondary | ICD-10-CM

## 2021-02-08 MED ORDER — FILGRASTIM-SNDZ 300 MCG/0.5 ML INJECTION SYRINGE
300.0000 ug | INJECTION | Freq: Once | INTRAMUSCULAR | Status: AC
Start: 2021-02-08 — End: 2021-02-08
  Administered 2021-02-08: 300 ug via SUBCUTANEOUS
  Filled 2021-02-08: qty 0.5

## 2021-02-08 NOTE — Discharge Instructions (Signed)
Patient Education     Understanding Filgrastim  Your healthcare provider has prescribed the medicine filgrastim. This medicine is a man-made version of a protein called granulocyte colony-stimulating factor (G-CSF). Your body makes G-CSF to tell the bone marrow to make more white blood cells.   Filgrastim is not a cancer treatment. It's a supportive therapy. It's used to raise the number of neutrophils in your blood. A neutrophil is a type of infection-fighting white blood cell. You're getting this medicine because your last chemotherapy (chemo) treatment caused your neutrophil count (ANC, or absolute neutrophil count) to drop. Your ANC is measured with blood tests that are done after chemo.   Filgrastim costs a lot of money. Talk with your provider to find out if your insurance will pay for it and how much you'll have to pay. There are many different brands of filgrastim. You may have to get the brand your insurance covers.   Filgrastim is given as a shot. Your healthcare provider or pharmacist can give you more information about it.   This sheet will also help you learn more about filgrastim.     What filgrastim can do for you   This medicine can:   Make you less likely to get an infection   Make it safer for you to be around other people, and as a result, you can do more   Help prevent illness that could cause a delay or change in your treatment  How it works   Certain kinds of chemo reduce the number of neutrophils in your blood. As the number of these cells go down, you are less able to fight infection. Filgrastim helps your bone marrow make these blood cells and push them out into your blood faster.   About 24 hours after chemo, you'll get your first filgrastim shot. You'll get a shot every day until your blood counts reach a certain level. Talk with your healthcare provider about this level and how long you may need to get filgrastim.   Coping with side effects   The most common side effects are:    Bone,  joint, and muscle pain   Mild fever   Redness, swelling, or itching where the shot is given    These symptoms can often be eased with a heating pad and certain medicines. Check with your healthcare provider before you take any medicine for filgrastim side effects.   Other rare side effects include:    Headache   Pain in the lower back or pelvis   Skin rash or itching   Tiredness   Cough   Shortness of breath   Dizziness   Nausea    Tell your healthcare provider about any changes you notice. They can tell you what to do to manage or even prevent treatment side effects.   When should I call my healthcare provider?  Call your healthcare provider right away if any of these occur:    Possible signs of infection, such as:  ? Fever  ? Chills  ? Sore throat  ? Diarrhea  ? Redness at the site of a wound or sore  ? Burning when passing urine or changes in how your urine looks or smells   Pain in your left upper stomach or left shoulder area(This could be a sign of an enlarged spleen, a rare side effect of this treatment.)   Easy bruising and bleeding   Shortness of breath, wheezing, dizziness, swelling around the mouth or eyes, quick pulse, or  sweating   Redness, swelling, or itchingat the site of the shot  Talk with your healthcare providers about what symptoms to watch for and when to call them. Know what number to call with questions or problems. Is there a different number to call when the clinic is closed on evenings, weekends, and holidays?   [employer] last reviewed this educational content on 11/15/2019   2000-2021 [employer]. All rights reserved. This information is not intended as a substitute for professional medical care. Always follow your healthcare professional's instructions.

## 2021-02-08 NOTE — Nurses Notes (Signed)
Patient discharged home.  AVS reviewed with patient.  A written copy of the AVS and discharge instructions was given to the patient.  Questions sufficiently answered as needed.  Patient encouraged to follow up with PCP as indicated.  In the event of an emergency, patient instructed to call 911 or go to the nearest emergency room.

## 2021-02-09 ENCOUNTER — Inpatient Hospital Stay (HOSPITAL_COMMUNITY)
Admission: RE | Admit: 2021-02-09 | Discharge: 2021-02-09 | Disposition: A | Discharge status: Still In Hospital | Payer: Medicare Other | Source: Ambulatory Visit | Attending: Hematology & Oncology | Admitting: Hematology & Oncology

## 2021-02-09 DIAGNOSIS — C3490 Malignant neoplasm of unspecified part of unspecified bronchus or lung: Secondary | ICD-10-CM

## 2021-02-09 DIAGNOSIS — D708 Other neutropenia: Secondary | ICD-10-CM

## 2021-02-09 DIAGNOSIS — D61811 Other drug-induced pancytopenia: Secondary | ICD-10-CM

## 2021-02-09 MED ORDER — FILGRASTIM-SNDZ 300 MCG/0.5 ML INJECTION SYRINGE
300.0000 ug | INJECTION | Freq: Once | INTRAMUSCULAR | Status: AC
Start: 2021-02-09 — End: 2021-02-09
  Administered 2021-02-09: 300 ug via SUBCUTANEOUS
  Filled 2021-02-09: qty 0.5

## 2021-02-10 ENCOUNTER — Inpatient Hospital Stay (HOSPITAL_COMMUNITY)
Admission: RE | Admit: 2021-02-10 | Discharge: 2021-02-10 | Disposition: A | Discharge status: Still In Hospital | Payer: Medicare Other | Source: Ambulatory Visit | Attending: Hematology & Oncology | Admitting: Hematology & Oncology

## 2021-02-10 DIAGNOSIS — C3490 Malignant neoplasm of unspecified part of unspecified bronchus or lung: Secondary | ICD-10-CM

## 2021-02-10 DIAGNOSIS — D708 Other neutropenia: Secondary | ICD-10-CM

## 2021-02-10 DIAGNOSIS — D61811 Other drug-induced pancytopenia: Secondary | ICD-10-CM

## 2021-02-10 MED ORDER — FILGRASTIM-SNDZ 300 MCG/0.5 ML INJECTION SYRINGE
300.0000 ug | INJECTION | Freq: Once | INTRAMUSCULAR | Status: AC
Start: 2021-02-10 — End: 2021-02-10
  Administered 2021-02-10 (×2): 300 ug via SUBCUTANEOUS
  Filled 2021-02-10: qty 0.5

## 2021-02-21 ENCOUNTER — Inpatient Hospital Stay (HOSPITAL_COMMUNITY): Payer: Medicare Other

## 2021-02-27 ENCOUNTER — Encounter (HOSPITAL_COMMUNITY): Payer: Self-pay | Admitting: Hematology & Oncology

## 2021-02-28 ENCOUNTER — Inpatient Hospital Stay (HOSPITAL_COMMUNITY)
Admission: RE | Admit: 2021-02-28 | Discharge: 2021-02-28 | Disposition: A | Discharge status: Still In Hospital | Payer: Medicare Other | Source: Ambulatory Visit | Attending: Hematology & Oncology | Admitting: Hematology & Oncology

## 2021-02-28 ENCOUNTER — Other Ambulatory Visit: Payer: Self-pay

## 2021-02-28 VITALS — BP 109/49 | HR 106 | Temp 98.2°F | Resp 24 | Ht 60.98 in | Wt 105.4 lb

## 2021-02-28 DIAGNOSIS — D708 Other neutropenia: Secondary | ICD-10-CM

## 2021-02-28 DIAGNOSIS — C801 Malignant (primary) neoplasm, unspecified: Secondary | ICD-10-CM

## 2021-02-28 DIAGNOSIS — Z95828 Presence of other vascular implants and grafts: Secondary | ICD-10-CM

## 2021-02-28 DIAGNOSIS — C3491 Malignant neoplasm of unspecified part of right bronchus or lung: Secondary | ICD-10-CM

## 2021-02-28 DIAGNOSIS — D649 Anemia, unspecified: Secondary | ICD-10-CM

## 2021-02-28 DIAGNOSIS — C3490 Malignant neoplasm of unspecified part of unspecified bronchus or lung: Secondary | ICD-10-CM

## 2021-02-28 DIAGNOSIS — D61811 Other drug-induced pancytopenia: Secondary | ICD-10-CM

## 2021-02-28 LAB — COMPREHENSIVE METABOLIC PANEL, NON-FASTING
ALBUMIN: 3.4 g/dL (ref 3.4–4.8)
ALKALINE PHOSPHATASE: 68 U/L (ref 55–145)
ALT (SGPT): 15 U/L (ref 8–22)
ANION GAP: 7 mmol/L (ref 4–13)
AST (SGOT): 26 U/L (ref 8–45)
BILIRUBIN TOTAL: 0.2 mg/dL — ABNORMAL LOW (ref 0.3–1.3)
BUN/CREA RATIO: 14 (ref 6–22)
BUN: 16 mg/dL (ref 8–25)
CALCIUM: 8.6 mg/dL — ABNORMAL LOW (ref 8.8–10.2)
CHLORIDE: 110 mmol/L (ref 96–111)
CO2 TOTAL: 23 mmol/L (ref 23–31)
CREATININE: 1.12 mg/dL — ABNORMAL HIGH (ref 0.60–1.05)
ESTIMATED GFR: 54 mL/min/BSA — ABNORMAL LOW (ref >=60)
GLUCOSE: 90 mg/dL (ref 65–125)
POTASSIUM: 4.3 mmol/L (ref 3.5–5.1)
PROTEIN TOTAL: 6.8 g/dL (ref 6.0–8.0)
SODIUM: 140 mmol/L (ref 136–145)

## 2021-02-28 LAB — CBC WITH DIFF
BASOPHIL #: <0.1 10*3/uL (ref <=0.20)
BASOPHIL %: 0 %
EOSINOPHIL #: <0.1 10*3/uL (ref <=0.50)
EOSINOPHIL %: 1 %
HCT: 25.8 % — ABNORMAL LOW (ref 34.8–46.0)
HGB: 8.3 g/dL — ABNORMAL LOW (ref 11.5–16.0)
IMMATURE GRANULOCYTE #: <0.1 10*3/uL (ref <0.10)
IMMATURE GRANULOCYTE %: 3 % — ABNORMAL HIGH (ref 0–1)
LYMPHOCYTE #: 0.43 10*3/uL — ABNORMAL LOW (ref 1.00–4.80)
LYMPHOCYTE %: 17 %
MCH: 33.2 pg — ABNORMAL HIGH (ref 26.0–32.0)
MCHC: 32.2 g/dL (ref 31.0–35.5)
MCV: 103.2 fL — ABNORMAL HIGH (ref 78.0–100.0)
MONOCYTE #: 0.68 10*3/uL (ref 0.20–1.10)
MONOCYTE %: 27 %
MPV: 9.7 fL (ref 8.7–12.5)
NEUTROPHIL #: 1.32 10*3/uL — ABNORMAL LOW (ref 1.50–7.70)
NEUTROPHIL %: 52 %
PLATELETS: 153 10*3/uL (ref 150–400)
RBC: 2.5 10*6/uL — ABNORMAL LOW (ref 3.85–5.22)
RDW-CV: 16.1 % — ABNORMAL HIGH (ref 11.5–15.5)
WBC: 2.5 10*3/uL — ABNORMAL LOW (ref 3.7–11.0)

## 2021-02-28 LAB — MAGNESIUM: MAGNESIUM: 1.9 mg/dL (ref 1.8–2.6)

## 2021-02-28 MED ORDER — SODIUM CHLORIDE 0.9 % INTRAVENOUS SOLUTION
12.0000 mg | Freq: Once | INTRAVENOUS | Status: AC
Start: 2021-02-28 — End: 2021-02-28
  Administered 2021-02-28: 0 mg via INTRAVENOUS
  Administered 2021-02-28: 12 mg via INTRAVENOUS
  Filled 2021-02-28: qty 3

## 2021-02-28 MED ORDER — SODIUM CHLORIDE 0.9 % INTRAVENOUS SOLUTION
240.0000 mg | Freq: Once | INTRAVENOUS | Status: AC
Start: 2021-02-28 — End: 2021-02-28
  Administered 2021-02-28: 0 mg via INTRAVENOUS
  Administered 2021-02-28: 240 mg via INTRAVENOUS
  Filled 2021-02-28: qty 24

## 2021-02-28 MED ORDER — SODIUM CHLORIDE 0.9 % (FLUSH) INJECTION SYRINGE
10.0000 mL | INJECTION | Freq: Once | INTRAMUSCULAR | Status: AC
Start: 2021-02-28 — End: 2021-02-28
  Administered 2021-02-28: 10 mL

## 2021-02-28 MED ORDER — SODIUM CHLORIDE 0.9 % INTRAVENOUS SOLUTION
150.0000 mg | Freq: Once | INTRAVENOUS | Status: AC
Start: 2021-02-28 — End: 2021-02-28
  Administered 2021-02-28: 0 mg via INTRAVENOUS
  Administered 2021-02-28: 150 mg via INTRAVENOUS
  Filled 2021-02-28: qty 5

## 2021-02-28 MED ORDER — HEPARIN, PORCINE (PF) 100 UNIT/ML INTRAVENOUS SYRINGE
5.0000 mL | INJECTION | Freq: Once | INTRAVENOUS | Status: AC
Start: 2021-02-28 — End: 2021-02-28
  Administered 2021-02-28 (×2): 5 mL
  Filled 2021-02-28: qty 5

## 2021-02-28 MED ORDER — SODIUM CHLORIDE 0.9% FLUSH BAG - 250 ML
INTRAVENOUS | Status: DC | PRN
Start: 2021-02-28 — End: 2021-03-01

## 2021-02-28 MED ORDER — PALONOSETRON 0.25 MG/5 ML INTRAVENOUS SOLUTION
0.2500 mg | Freq: Once | INTRAVENOUS | Status: AC
Start: 2021-02-28 — End: 2021-02-28
  Administered 2021-02-28: 0.25 mg via INTRAVENOUS
  Filled 2021-02-28: qty 5

## 2021-02-28 MED ORDER — SODIUM CHLORIDE 0.9 % INTRAVENOUS SOLUTION
375.0000 mg/m2 | Freq: Once | INTRAVENOUS | Status: AC
Start: 2021-02-28 — End: 2021-02-28
  Administered 2021-02-28: 525 mg via INTRAVENOUS
  Administered 2021-02-28 (×2): 0 mg via INTRAVENOUS
  Filled 2021-02-28: qty 20

## 2021-02-28 NOTE — Discharge Instructions (Signed)
Patient Education     Discharge Instructions for Chemotherapy  Your healthcare provider prescribed a type of medicine therapy for you called chemotherapy. It's also known as chemo. Chemo is used for many different types of illnesses, including cancer. There are many types of chemo. This sheet gives some general information on how you can take care ofyourself after your chemo. Talk with your healthcare provider about other details based on your own treatment plan.  Mouth care  You may get mouth sores, even if you follow all of your healthcare provider's instructions. Many people get mouth sores as a side effect of chemo. Here's what you can do to help prevent mouth sores:   Keep your mouth clean. Brush your teeth with a soft-bristle toothbrush after every meal.   Ask if you should use a toothpaste with fluoride. Or you may be told to brush your teeth with a mixture of 1 teaspoon of salt in 8 ounces of water.   Use an oral swab or special soft toothbrush if your gums bleed during brushing.   Don't use dental floss if it causes your gums to bleed.   Use any mouthwash given to you as directed.   If you can't tolerate regular methods, use salt and baking soda to clean your mouth. Mix1teaspoonof salt and1teaspoon of baking soda in 1 quart of warm water. Swish and spit.   If you wear dentures, you may be told to wear them only when you eat. Ask your healthcare provider. Clean your dentures twice a day. Soak them in antimicrobial solution when you aren't wearing them. Rinse your mouth after each meal.   Check your mouth and tonguefor white patches. Thismay bea sign of a type of yeast infection called thrush. This is a common side effect of chemo. Tell your healthcare provider if you get these patches. He or she can prescribe medicine to treat them.  Other self-care  Here's what else you can do:   Try to exercise. Exercise keeps you strong and keeps your heart and lungs active. Walking and yoga are good  types of exercise.   Keep clean. During chemo, your body can't fight infection very well. Take short baths or showers. Wash your hands before you eat and after going to the bathroom.   Avoid people who are sick with an illness you could catch. This includes people with colds, flu, measles, or chicken pox. This also includes people who have recently had a vaccine for any illness.   Let your healthcare provider know if your throat is sore. You may have an infection that needs treatment.   Be gentle to your skin. Use moisturizing soap. Chemo can make your skin dry. Apply lotion several times a day to help relieve dry skin. Don't take very hot or very cold showers or baths.   Don't be surprised if your chemo causes slight burns to your skin. This can happen most often on the hands and feet. Some medicines used in high doses cause this. Ask for a special cream to help relieve the burn and protect your skin.  Many people on chemo feel sick and find it hard to eat during treatment. Try these tips:   Eat small meals several times a day to keep your strength up.   Choose bland foods with little taste or smell if you are reacting strongly to food.   Be sure to cook all food fully. This kills bacteria and helps you prevent illness.   Eat foods that   are soft. Soft foods are less likely to cause stomach irritation.   Try to eat a variety of foods for a well-balanced diet. Drink plenty of fluids. Eat foods with fiber unless your healthcare provider says not to. Fiber can help to prevent constipation.  When to call your healthcare provider  Call your healthcare provider right away if you have any of the following:   Unexplained bleeding   Trouble concentrating   Ongoing fatigue   Shortness of breath, wheezing, trouble breathing, or bad cough   Rapid, irregular heartbeat, or chest pain   Dizziness, lightheadedness   Constant feeling of being cold   Hives oracut or rash that swells, turns red, feels hot or  painful, or begins to ooze   Burning when you urinate   Feverof100.4F (38C) orhigher, oras directed by your healthcare provider  [employer] last reviewed this educational content on 10/14/2017   2000-2021 [employer]. All rights reserved. This information is not intended as a substitute for professional medical care. Always follow your healthcare professional's instructions.

## 2021-02-28 NOTE — Nurses Notes (Signed)
Patient discharged home with family.  AVS reviewed with patient/care giver.  A written copy of the AVS and discharge instructions was given to the patient/care giver.  Questions sufficiently answered as needed.  Patient/care giver encouraged to follow up with PCP as indicated.  In the event of an emergency, patient/care giver instructed to call 911 or go to the nearest emergency room.

## 2021-03-01 ENCOUNTER — Other Ambulatory Visit: Payer: Self-pay

## 2021-03-01 ENCOUNTER — Inpatient Hospital Stay (HOSPITAL_COMMUNITY)
Admission: RE | Admit: 2021-03-01 | Discharge: 2021-03-01 | Disposition: A | Discharge status: Still In Hospital | Payer: Medicare Other | Source: Ambulatory Visit | Attending: Hematology & Oncology | Admitting: Hematology & Oncology

## 2021-03-01 DIAGNOSIS — D61811 Other drug-induced pancytopenia: Secondary | ICD-10-CM

## 2021-03-01 DIAGNOSIS — C3490 Malignant neoplasm of unspecified part of unspecified bronchus or lung: Secondary | ICD-10-CM

## 2021-03-01 DIAGNOSIS — D708 Other neutropenia: Secondary | ICD-10-CM

## 2021-03-01 MED ORDER — FILGRASTIM-SNDZ 300 MCG/0.5 ML INJECTION SYRINGE
300.0000 ug | INJECTION | Freq: Once | INTRAMUSCULAR | Status: AC
Start: 2021-03-01 — End: 2021-03-01
  Administered 2021-03-01: 300 ug via SUBCUTANEOUS
  Filled 2021-03-01: qty 0.5

## 2021-03-02 ENCOUNTER — Encounter (HOSPITAL_COMMUNITY): Payer: Self-pay | Admitting: Hematology & Oncology

## 2021-03-02 ENCOUNTER — Inpatient Hospital Stay
Admission: RE | Admit: 2021-03-02 | Discharge: 2021-03-02 | Disposition: A | Discharge status: Still In Hospital | Payer: Medicare Other | Source: Ambulatory Visit | Attending: Hematology & Oncology | Admitting: Hematology & Oncology

## 2021-03-02 DIAGNOSIS — C3490 Malignant neoplasm of unspecified part of unspecified bronchus or lung: Secondary | ICD-10-CM

## 2021-03-02 DIAGNOSIS — C801 Malignant (primary) neoplasm, unspecified: Secondary | ICD-10-CM

## 2021-03-02 DIAGNOSIS — D708 Other neutropenia: Secondary | ICD-10-CM

## 2021-03-02 DIAGNOSIS — D61811 Other drug-induced pancytopenia: Secondary | ICD-10-CM

## 2021-03-02 DIAGNOSIS — C3491 Malignant neoplasm of unspecified part of right bronchus or lung: Secondary | ICD-10-CM

## 2021-03-02 DIAGNOSIS — C7951 Secondary malignant neoplasm of bone: Secondary | ICD-10-CM

## 2021-03-02 MED ORDER — FILGRASTIM-SNDZ 300 MCG/0.5 ML INJECTION SYRINGE
300.0000 ug | INJECTION | Freq: Once | INTRAMUSCULAR | Status: AC
Start: 2021-03-02 — End: 2021-03-02
  Administered 2021-03-02 (×2): 300 ug via SUBCUTANEOUS
  Filled 2021-03-02: qty 0.5

## 2021-03-02 NOTE — Nurses Notes (Signed)
Patient discharged home.  AVS reviewed with patient.  A written copy of the AVS and discharge instructions was given to the patient.  Questions sufficiently answered as needed.  Patient encouraged to follow up with PCP as indicated.  In the event of an emergency, patient instructed to call 911 or go to the nearest emergency room.

## 2021-03-03 ENCOUNTER — Other Ambulatory Visit: Payer: Self-pay

## 2021-03-03 ENCOUNTER — Inpatient Hospital Stay (HOSPITAL_COMMUNITY)
Admission: RE | Admit: 2021-03-03 | Discharge: 2021-03-03 | Disposition: A | Discharge status: Still In Hospital | Payer: Medicare Other | Source: Ambulatory Visit

## 2021-03-03 DIAGNOSIS — C3490 Malignant neoplasm of unspecified part of unspecified bronchus or lung: Secondary | ICD-10-CM

## 2021-03-03 DIAGNOSIS — Z79899 Other long term (current) drug therapy: Secondary | ICD-10-CM

## 2021-03-03 DIAGNOSIS — D703 Neutropenia due to infection: Secondary | ICD-10-CM | POA: Diagnosis present

## 2021-03-03 DIAGNOSIS — D61811 Other drug-induced pancytopenia: Secondary | ICD-10-CM

## 2021-03-03 DIAGNOSIS — Z85118 Personal history of other malignant neoplasm of bronchus and lung: Secondary | ICD-10-CM

## 2021-03-03 DIAGNOSIS — A419 Sepsis, unspecified organism: Principal | ICD-10-CM | POA: Diagnosis present

## 2021-03-03 DIAGNOSIS — I7300 Raynaud's syndrome without gangrene: Secondary | ICD-10-CM | POA: Diagnosis present

## 2021-03-03 DIAGNOSIS — Z803 Family history of malignant neoplasm of breast: Secondary | ICD-10-CM

## 2021-03-03 DIAGNOSIS — Z87891 Personal history of nicotine dependence: Secondary | ICD-10-CM

## 2021-03-03 DIAGNOSIS — R000 Tachycardia, unspecified: Secondary | ICD-10-CM | POA: Diagnosis present

## 2021-03-03 DIAGNOSIS — I10 Essential (primary) hypertension: Secondary | ICD-10-CM | POA: Diagnosis present

## 2021-03-03 DIAGNOSIS — Z20822 Contact with and (suspected) exposure to covid-19: Secondary | ICD-10-CM | POA: Diagnosis present

## 2021-03-03 DIAGNOSIS — C7951 Secondary malignant neoplasm of bone: Secondary | ICD-10-CM | POA: Diagnosis present

## 2021-03-03 DIAGNOSIS — Z86010 Personal history of colonic polyps: Secondary | ICD-10-CM

## 2021-03-03 DIAGNOSIS — T451X5A Adverse effect of antineoplastic and immunosuppressive drugs, initial encounter: Secondary | ICD-10-CM | POA: Diagnosis present

## 2021-03-03 DIAGNOSIS — R5081 Fever presenting with conditions classified elsewhere: Secondary | ICD-10-CM | POA: Diagnosis present

## 2021-03-03 DIAGNOSIS — Z79890 Hormone replacement therapy (postmenopausal): Secondary | ICD-10-CM

## 2021-03-03 DIAGNOSIS — D708 Other neutropenia: Secondary | ICD-10-CM

## 2021-03-03 DIAGNOSIS — K219 Gastro-esophageal reflux disease without esophagitis: Secondary | ICD-10-CM | POA: Diagnosis present

## 2021-03-03 DIAGNOSIS — N179 Acute kidney failure, unspecified: Secondary | ICD-10-CM | POA: Diagnosis present

## 2021-03-03 DIAGNOSIS — C3491 Malignant neoplasm of unspecified part of right bronchus or lung: Secondary | ICD-10-CM | POA: Diagnosis present

## 2021-03-03 DIAGNOSIS — D61810 Antineoplastic chemotherapy induced pancytopenia: Secondary | ICD-10-CM | POA: Diagnosis present

## 2021-03-03 DIAGNOSIS — Z923 Personal history of irradiation: Secondary | ICD-10-CM

## 2021-03-03 DIAGNOSIS — E039 Hypothyroidism, unspecified: Secondary | ICD-10-CM | POA: Diagnosis present

## 2021-03-03 MED ORDER — FILGRASTIM-SNDZ 300 MCG/0.5 ML INJECTION SYRINGE
300.0000 ug | INJECTION | Freq: Once | INTRAMUSCULAR | Status: AC
Start: 2021-03-03 — End: 2021-03-03
  Administered 2021-03-03 (×2): 300 ug via SUBCUTANEOUS
  Filled 2021-03-03: qty 0.5

## 2021-03-03 NOTE — Nurses Notes (Signed)
Patient discharged home.  AVS reviewed with patient.  A written copy of the AVS and discharge instructions was given to the patient.  Questions sufficiently answered as needed.  Patient encouraged to follow up with PCP as indicated.  In the event of an emergency, patient instructed to call 911 or go to the nearest emergency room.

## 2021-03-04 ENCOUNTER — Other Ambulatory Visit (INDEPENDENT_AMBULATORY_CARE_PROVIDER_SITE_OTHER): Payer: Self-pay | Admitting: Family

## 2021-03-05 ENCOUNTER — Emergency Department (HOSPITAL_COMMUNITY): Payer: Medicare Other

## 2021-03-05 ENCOUNTER — Other Ambulatory Visit: Payer: Self-pay

## 2021-03-05 ENCOUNTER — Encounter (HOSPITAL_COMMUNITY): Payer: Self-pay | Admitting: Hematology & Oncology

## 2021-03-05 ENCOUNTER — Inpatient Hospital Stay (HOSPITAL_COMMUNITY)
Admission: RE | Admit: 2021-03-05 | Discharge: 2021-03-05 | Disposition: A | Discharge status: Still In Hospital | Payer: Medicare Other | Source: Ambulatory Visit | Attending: Hematology & Oncology | Admitting: Hematology & Oncology

## 2021-03-05 ENCOUNTER — Encounter (HOSPITAL_COMMUNITY): Payer: Self-pay | Admitting: Emergency Medicine

## 2021-03-05 ENCOUNTER — Inpatient Hospital Stay
Admission: EM | Admit: 2021-03-05 | Discharge: 2021-03-09 | DRG: 871 | Disposition: A | Discharge status: Home / Self Care | Payer: Medicare Other | Attending: INTERNAL MEDICINE | Admitting: INTERNAL MEDICINE

## 2021-03-05 ENCOUNTER — Inpatient Hospital Stay (HOSPITAL_COMMUNITY): Payer: Medicare Other | Admitting: Internal Medicine

## 2021-03-05 ENCOUNTER — Ambulatory Visit (HOSPITAL_BASED_OUTPATIENT_CLINIC_OR_DEPARTMENT_OTHER): Payer: Medicare Other | Admitting: Hematology & Oncology

## 2021-03-05 ENCOUNTER — Emergency Department (EMERGENCY_DEPARTMENT_HOSPITAL): Payer: Medicare Other

## 2021-03-05 DIAGNOSIS — D708 Other neutropenia: Secondary | ICD-10-CM

## 2021-03-05 DIAGNOSIS — C3491 Malignant neoplasm of unspecified part of right bronchus or lung: Secondary | ICD-10-CM

## 2021-03-05 DIAGNOSIS — Z87891 Personal history of nicotine dependence: Secondary | ICD-10-CM

## 2021-03-05 DIAGNOSIS — R5081 Fever presenting with conditions classified elsewhere: Secondary | ICD-10-CM | POA: Diagnosis present

## 2021-03-05 DIAGNOSIS — C7951 Secondary malignant neoplasm of bone: Secondary | ICD-10-CM | POA: Insufficient documentation

## 2021-03-05 DIAGNOSIS — G893 Neoplasm related pain (acute) (chronic): Secondary | ICD-10-CM

## 2021-03-05 DIAGNOSIS — D649 Anemia, unspecified: Secondary | ICD-10-CM

## 2021-03-05 DIAGNOSIS — T50901A Poisoning by unspecified drugs, medicaments and biological substances, accidental (unintentional), initial encounter: Secondary | ICD-10-CM | POA: Insufficient documentation

## 2021-03-05 DIAGNOSIS — D61811 Other drug-induced pancytopenia: Secondary | ICD-10-CM | POA: Insufficient documentation

## 2021-03-05 DIAGNOSIS — E039 Hypothyroidism, unspecified: Secondary | ICD-10-CM | POA: Diagnosis present

## 2021-03-05 DIAGNOSIS — A419 Sepsis, unspecified organism: Principal | ICD-10-CM | POA: Diagnosis present

## 2021-03-05 DIAGNOSIS — T451X5A Adverse effect of antineoplastic and immunosuppressive drugs, initial encounter: Secondary | ICD-10-CM | POA: Diagnosis present

## 2021-03-05 DIAGNOSIS — K219 Gastro-esophageal reflux disease without esophagitis: Secondary | ICD-10-CM | POA: Diagnosis present

## 2021-03-05 DIAGNOSIS — Z79899 Other long term (current) drug therapy: Secondary | ICD-10-CM

## 2021-03-05 DIAGNOSIS — C801 Malignant (primary) neoplasm, unspecified: Secondary | ICD-10-CM

## 2021-03-05 DIAGNOSIS — R000 Tachycardia, unspecified: Secondary | ICD-10-CM | POA: Diagnosis present

## 2021-03-05 DIAGNOSIS — Z95828 Presence of other vascular implants and grafts: Secondary | ICD-10-CM

## 2021-03-05 DIAGNOSIS — I10 Essential (primary) hypertension: Secondary | ICD-10-CM | POA: Diagnosis present

## 2021-03-05 DIAGNOSIS — Z86010 Personal history of colonic polyps: Secondary | ICD-10-CM

## 2021-03-05 DIAGNOSIS — D61810 Antineoplastic chemotherapy induced pancytopenia: Secondary | ICD-10-CM | POA: Diagnosis present

## 2021-03-05 DIAGNOSIS — Z79890 Hormone replacement therapy (postmenopausal): Secondary | ICD-10-CM

## 2021-03-05 DIAGNOSIS — Z803 Family history of malignant neoplasm of breast: Secondary | ICD-10-CM

## 2021-03-05 DIAGNOSIS — C3490 Malignant neoplasm of unspecified part of unspecified bronchus or lung: Secondary | ICD-10-CM | POA: Diagnosis present

## 2021-03-05 DIAGNOSIS — Z923 Personal history of irradiation: Secondary | ICD-10-CM

## 2021-03-05 DIAGNOSIS — Z20822 Contact with and (suspected) exposure to covid-19: Secondary | ICD-10-CM | POA: Diagnosis present

## 2021-03-05 DIAGNOSIS — N179 Acute kidney failure, unspecified: Secondary | ICD-10-CM | POA: Diagnosis present

## 2021-03-05 DIAGNOSIS — D703 Neutropenia due to infection: Secondary | ICD-10-CM | POA: Diagnosis present

## 2021-03-05 DIAGNOSIS — D709 Neutropenia, unspecified: Principal | ICD-10-CM | POA: Diagnosis present

## 2021-03-05 DIAGNOSIS — E876 Hypokalemia: Secondary | ICD-10-CM | POA: Insufficient documentation

## 2021-03-05 DIAGNOSIS — Z85118 Personal history of other malignant neoplasm of bronchus and lung: Secondary | ICD-10-CM

## 2021-03-05 DIAGNOSIS — I7300 Raynaud's syndrome without gangrene: Secondary | ICD-10-CM | POA: Diagnosis present

## 2021-03-05 DIAGNOSIS — Z5111 Encounter for antineoplastic chemotherapy: Secondary | ICD-10-CM | POA: Insufficient documentation

## 2021-03-05 LAB — CBC WITH DIFF
HCT: 26.7 % — ABNORMAL LOW (ref 34.8–46.0)
HGB: 8.4 g/dL — ABNORMAL LOW (ref 11.5–16.0)
MCH: 32.8 pg — ABNORMAL HIGH (ref 26.0–32.0)
MCHC: 31.5 g/dL (ref 31.0–35.5)
MCV: 104.3 fL — ABNORMAL HIGH (ref 78.0–100.0)
MPV: 10.3 fL (ref 8.7–12.5)
PLATELETS: 143 10*3/uL — ABNORMAL LOW (ref 150–400)
RBC: 2.56 10*6/uL — ABNORMAL LOW (ref 3.85–5.22)
RDW-CV: 15.6 % — ABNORMAL HIGH (ref 11.5–15.5)
WBC: 0.4 10*3/uL — ABNORMAL LOW (ref 3.7–11.0)

## 2021-03-05 LAB — LACTIC ACID LEVEL W/ REFLEX FOR LEVEL >2.0: LACTIC ACID: 1.5 mmol/L (ref 0.5–2.2)

## 2021-03-05 LAB — PTT (PARTIAL THROMBOPLASTIN TIME): APTT: 25 seconds (ref 24.0–36.5)

## 2021-03-05 LAB — PT/INR
INR: 1.16
PROTHROMBIN TIME: 12.9 seconds — ABNORMAL HIGH (ref 9.4–12.5)

## 2021-03-05 MED ORDER — ACETAMINOPHEN 1,000 MG/100 ML (10 MG/ML) INTRAVENOUS SOLUTION
15.0000 mg/kg | INTRAVENOUS | Status: AC
Start: 2021-03-06 — End: 2021-03-06
  Administered 2021-03-05: 708 mg via INTRAVENOUS
  Administered 2021-03-06: 0 mg/kg via INTRAVENOUS
  Filled 2021-03-05: qty 100

## 2021-03-05 MED ORDER — CALCIUM 500 MG (AS CALCIUM CARBONATE 1,250 MG) CHEWABLE TABLET
1.0000 | CHEWABLE_TABLET | Freq: Every day | ORAL | Status: DC
Start: 2021-03-05 — End: 2021-03-06

## 2021-03-05 MED ORDER — HEPARIN, PORCINE (PF) 100 UNIT/ML INTRAVENOUS SYRINGE
5.0000 mL | INJECTION | Freq: Once | INTRAVENOUS | Status: AC
Start: 2021-03-05 — End: 2021-03-05
  Administered 2021-03-05: 5 mL
  Filled 2021-03-05: qty 5

## 2021-03-05 MED ORDER — SODIUM CHLORIDE 0.9 % IV BOLUS
30.0000 mL/kg | INJECTION | Status: AC
Start: 2021-03-05 — End: 2021-03-06
  Administered 2021-03-05 (×2): 1434 mL via INTRAVENOUS
  Administered 2021-03-06: 0 mL/kg via INTRAVENOUS

## 2021-03-05 MED ORDER — ZOLEDRONIC ACID 4 MG/5 ML INTRAVENOUS SOLUTION
3.0000 mg | Freq: Once | INTRAVENOUS | Status: AC
Start: 2021-03-05 — End: 2021-03-05
  Administered 2021-03-05: 3 mg via INTRAVENOUS
  Administered 2021-03-05: 0 mg via INTRAVENOUS
  Filled 2021-03-05: qty 3.75

## 2021-03-05 MED ORDER — SODIUM CHLORIDE 0.9 % (FLUSH) INJECTION SYRINGE
10.0000 mL | INJECTION | Freq: Once | INTRAMUSCULAR | Status: AC
Start: 2021-03-05 — End: 2021-03-05
  Administered 2021-03-05: 10 mL

## 2021-03-05 NOTE — Nurses Notes (Signed)
Patient discharged home.  AVS reviewed with patient.  A written copy of the AVS and discharge instructions was given to the patient.  Questions sufficiently answered as needed.  Patient encouraged to follow up with PCP as indicated.  In the event of an emergency, patient instructed to call 911 or go to the nearest emergency room.

## 2021-03-05 NOTE — Discharge Instructions (Signed)
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  ZOLEDRONIC ACID (ZOE le dron ik AS id) lowers the amount of calcium loss from bone. It is used to treat Paget's disease and osteoporosis in women.  This medicine may be used for other purposes; ask your health care provider or pharmacist if you have questions.  What should I tell my health care provider before I take this medicine?  They need to know if you have any of these conditions:   aspirin-sensitive asthma   cancer, especially if you are receiving medicines used to treat cancer   dental disease or wear dentures   infection   kidney disease   low levels of calcium in the blood   past surgery on the parathyroid gland or intestines   receiving corticosteroids like dexamethasone or prednisone   an unusual or allergic reaction to zoledronic acid, other medicines, foods, dyes, or preservatives   pregnant or trying to get pregnant   breast-feeding  How should I use this medicine?  This medicine is for infusion into a vein. It is given by a health care professional in a hospital or clinic setting.  Talk to your pediatrician regarding the use of this medicine in children. This medicine is not approved for use in children.  Overdosage: If you think you have taken too much of this medicine contact a poison control center or emergency room at once.  NOTE: This medicine is only for you. Do not share this medicine with others.  What if I miss a dose?  It is important not to miss your dose. Call your doctor or health care professional if you are unable to keep an appointment.  What may interact with this medicine?   certain antibiotics given by injection   NSAIDs, medicines for pain and inflammation, like ibuprofen or naproxen   some  diuretics like bumetanide, furosemide   teriparatide  This list may not describe all possible interactions. Give your health care provider a list of all the medicines, herbs, non-prescription drugs, or dietary supplements you use. Also tell them if you smoke, drink alcohol, or use illegal drugs. Some items may interact with your medicine.  What should I watch for while using this medicine?  Visit your doctor or health care professional for regular checkups. It may be some time before you see the benefit from this medicine. Do not stop taking your medicine unless your doctor tells you to. Your doctor may order blood tests or other tests to see how you are doing.  Women should inform their doctor if they wish to become pregnant or think they might be pregnant. There is a potential for serious side effects to an unborn child. Talk to your health care professional or pharmacist for more information.  You should make sure that you get enough calcium and vitamin D while you are taking this medicine. Discuss the foods you eat and the vitamins you take with your health care professional.  Some people who take this medicine have severe bone, joint, and/or muscle pain. This medicine may also increase your risk for jaw problems or a broken thigh bone. Tell your doctor right away if you have severe pain in your jaw, bones, joints, or muscles. Tell your doctor if you have any pain that   does not go away or that gets worse.  Tell your dentist and dental surgeon that you are taking this medicine. You should not have major dental surgery while on this medicine. See your dentist to have a dental exam and fix any dental problems before starting this medicine. Take good care of your teeth while on this medicine. Make sure you see your dentist for regular follow-up appointments.  What side effects may I notice from receiving this medicine?  Side effects that you should report to your doctor or health care professional as soon as  possible:   allergic reactions like skin rash, itching or hives, swelling of the face, lips, or tongue   anxiety, confusion, or depression   breathing problems   changes in vision   eye pain   feeling faint or lightheaded, falls   jaw pain, especially after dental work   mouth sores   muscle cramps, stiffness, or weakness   redness, blistering, peeling or loosening of the skin, including inside the mouth   trouble passing urine or change in the amount of urine  Side effects that usually do not require medical attention (report to your doctor or health care professional if they continue or are bothersome):   bone, joint, or muscle pain   constipation   diarrhea   fever   hair loss   irritation at site where injected   loss of appetite   nausea, vomiting   stomach upset   trouble sleeping   trouble swallowing   weak or tired  This list may not describe all possible side effects. Call your doctor for medical advice about side effects. You may report side effects to FDA at 1-800-FDA-1088.  Where should I keep my medicine?  This drug is given in a hospital or clinic and will not be stored at home.  NOTE:This sheet is a summary. It may not cover all possible information. If you have questions about this medicine, talk to your doctor, pharmacist, or health care provider. Copyright 2018 [employer]

## 2021-03-05 NOTE — Progress Notes (Addendum)
Return Patient Progress Note    Date: 03/05/2021    Name: Gina Barrett  MRN: [id number]  DOB: [birth year]   Referring Physician: Self, Referral  Primary Care Provider: Lanier Ensign, FNP-C    Reason for visit/consultation Lung Cancer (Malignant neoplasm of lung, unspecified laterality, unspecified part of lung )      Interval History:   Ms Blankenhorn is in clinic for f/u metastatic lung cancer and repots no change feeling well.  She is due to Zometa today.  Her pain is controlled.    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  -PET-CT scan on 09/06/2016 showed 10 mm right upper lobe FDG avid lesion that is slightly larger than 2017 CT and concerning for malignancy.  A separate 3 cm long area of tracer uptake in the left lower lobe was noted.  -CT-guided biopsy a [facility] on 10/02/2016 was positive for adenocarcinoma that is well to moderately differentiated.  -Mediastinoscopy 11/13/2016 positive for left mid paratrachael    Stage: IV, initially diagnosed as IIIB     Molecular/special studies:  ROS-1, ALK-1, EGFR, PDL1 all negative    Treatment & Surveillance:   1. S/p chemoradiation with carboplatin + paclitaxel + RT 66.6 Gy completed 02/10/19/18    2. Serial imaging since completion of therapy has been negative for recurrence.  Most recent imaging in on 12/03/2018 was also negative but showed a new T6 compression abnormality.     3. Chest CT with contrast from 09/23/2019 which in summary showed increasing pleural based density in the LLL which may be  infectious or inflammatory.  There is increased 1.7cm LN in left hilum ans stable compression abnormality of T6.    4. Biopsy of left LL pleural bases mass on 11/02/2019 showed benign fibrovascular tissue with focal chronic inflammation.  no lung parechyma or malgnancy identified.    5. Patient developed progressive musculoskeletal pain around October /November of 2021 evaluation with multiple imaging studies highly suggest metastatic disease.  Evaluation for plasma cell was noncontributory.  Patient was referred to orthopedic surgery (Dr. Hervey Ard, by family request and known to patient)  for surgical recommendation especially regarding weight-bearing.    6. Biopsy of paraspinal soft tissue mass on 07/04/2020 confirmed metastatic carcinoma consistent with lung primary. CARIS NGS negative    7.Radiation Oncology for radiation to the acetabulum as well as the paraspinal mass started 07/19/2020 x 10 , ending 07/31/2020    8. Mediport placed  07/19/2020    9.Begin Zoledronic acid.  Has dentures and no dental eval needed    10. Brain MRI planne2/12/2020     11.  Begin palliative carboplatin & pemetrexed on 08/03/2020       ________________________________________________________________________________________________________________________________________________     Objective   Objective:   BP 115/61 (Site: Right, Patient Position: Sitting)   Pulse (!) 113   Temp 37.8 C (100.1 F) (Oral)   Resp 20   Ht 1.549 m (5' 0.98") Comment: *  Wt 47.4 kg (104 lb 9.6 oz)   SpO2 98%   BMI 19.77 kg/m       ECOG Status: 2 - Ambulatory and capable of all  selfcare, but unable to carry out any work activities.  Up and about more than 50% of waking hours.    Physical Exam  Constitutional:       Appearance: She is not toxic-appearing.      Comments: chronically ill appearing, on a wheelchair   HENT:      Mouth/Throat:      Pharynx: Oropharynx is clear.   Cardiovascular:      Rate and Rhythm: Normal rate and regular rhythm.      Heart  sounds: No murmur heard.  Pulmonary:      Effort: Pulmonary effort is normal.      Breath sounds: Normal breath sounds.   Abdominal:      Palpations: Abdomen is soft.      Tenderness: There is no abdominal tenderness.   Musculoskeletal:      Right lower leg: No edema.      Left lower leg: No edema.   Lymphadenopathy:      Cervical: No cervical adenopathy.   Neurological:      Mental Status: She is alert.          Past Medical History:   Diagnosis Date   . Abnormal Pap smear     ASCUS, ASCUS cannot rule out HGSIL   . Cancer (CMS HCC)     non small cell lung cancer (R)-s/p Chemo/Radiation completed 8/18   . Colon polyp    . Dysphagia    . Hypothyroid    . Non-small cell lung cancer, right (CMS [place])     s/p Chemo completed 9/18   . Raynaud's disease    . STD (sexually transmitted disease)     HSV   . Unspecified disorder of thyroid     Hypothyroidism         No current outpatient medications on file.     Allergies as of 03/05/2021 - Reviewed 03/05/2021   Allergen Reaction Noted   . Penicillins  08/15/2010       Social History  Occupation:   Social History     Occupational History     Employer: [employer] [place] [id number]   reports that she quit smoking about 22 years ago. Her smoking use included cigarettes. She started smoking about 50 years ago. She quit after 20.00 years of use. She has never used smokeless tobacco. She reports being sexually active and has had partner(s) who are female. She reports using the following method of birth control/protection: None. She reports that she does not drink alcohol and does not use drugs.    Labs:  Labs reviewed and interpreted:  Results for orders placed or performed during the hospital encounter of 03/16/21 (from the past 96 hour(s))   CBC/DIFF    Narrative    The following orders were created for panel order CBC/DIFF.  Procedure                               Abnormality         Status                     ---------                                -----------         ------  CBC WITH MVHQ[[id number]]                Abnormal            Final result               PATH COMMENT[[id number]]                                                                MANUAL DIFF AND MORPHOLO.Marland KitchenMarland Kitchen[[id number]]  Abnormal            Final result                 Please view results for these tests on the individual orders.   COMPREHENSIVE METABOLIC PANEL, NON-FASTING   Result Value Ref Range    SODIUM 139 136 - 145 mmol/L    POTASSIUM 3.9 3.5 - 5.1 mmol/L    CHLORIDE 108 96 - 111 mmol/L    CO2 TOTAL 20 (L) 23 - 31 mmol/L    ANION GAP 11 4 - 13 mmol/L    BUN 20 8 - 25 mg/dL    CREATININE 1.23 (H) 0.60 - 1.05 mg/dL    BUN/CREA RATIO 16 6 - 22    ESTIMATED GFR 48 (L) >=60 mL/min/BSA    ALBUMIN 3.0 (L) 3.4 - 4.8 g/dL     CALCIUM 8.9 8.8 - 10.2 mg/dL    GLUCOSE 87 65 - 125 mg/dL    ALKALINE PHOSPHATASE 69 55 - 145 U/L    ALT (SGPT) 31 (H) 8 - 22 U/L    AST (SGOT)  29 8 - 45 U/L    BILIRUBIN TOTAL 0.1 (L) 0.3 - 1.3 mg/dL    PROTEIN TOTAL 6.9 6.0 - 8.0 g/dL   TROPONIN-I   Result Value Ref Range    TROPONIN I 19 7 - 30 ng/L   CBC WITH DIFF   Result Value Ref Range    WBC 4.1 3.7 - 11.0 x10^3/uL    RBC 1.74 (L) 3.85 - 5.22 x10^6/uL    HGB 5.9 (LL) 11.5 - 16.0 g/dL    HCT 18.2 (L) 34.8 - 46.0 %    MCV 104.6 (H) 78.0 - 100.0 fL    MCH 33.9 (H) 26.0 - 32.0 pg    MCHC 32.4 31.0 - 35.5 g/dL    RDW-CV 15.8 (H) 11.5 - 15.5 %    PLATELETS 79 (L) 150 - 400 x10^3/uL    MPV 11.1 8.7 - 12.5 fL   MANUAL DIFF AND MORPHOLOGY-SYSMEX   Result Value Ref Range    NEUTROPHIL % 62 %    LYMPHOCYTE %  12 %    MONOCYTE % 15 %    EOSINOPHIL % 0 %    BASOPHIL % 0 %    NEUTROPHIL BANDS % 5 %    METAMYELOCYTE %  4 %    MYELOCYTE % 2 %    NEUTROPHIL # 2.75 1.50 - 7.70 x10^3/uL    LYMPHOCYTE # 0.49 (L) 1.00 - 4.80 x10^3/uL    MONOCYTE # 0.62 0.20 - 1.10 x10^3/uL    EOSINOPHIL # <0.10 <=0.50 x10^3/uL    BASOPHIL # <0.10 <=0.20 x10^3/uL    TOXIC GRANULATION Present (A) None    Narrative    18327Slight  macrocytosis noted.  COVID-19 Screening - COVID Only   Result Value Ref Range    SARS-CoV-2 Not Detected Not Detected    Narrative    Results are for the qualitative identification of SARS-CoV-2 (formerly 2019-nCoV) RNA. The SARS-CoV-2 RNA is generally detectable in nasopharyngeal swabs and nasal wash/aspirates during the ACUTE PHASE of infection. Hence, this test is intended to be performed on respiratory specimens collected from individuals who meet Centers for Disease Control and Prevention (CDC) clinical and/or epidemiological criteria for Coronavirus Disease 2019 (COVID-19) testing. CDC COVID-19 criteria for testing on human specimens are available at [facility] webpage information for Healthcare Professionals: Coronavirus Disease 2019 (COVID-19) ([email or web address]).    Disclaimer:  This assay has been authorized by FDA under an Emergency Use Authorization for use in laboratories certified under the Clinical Laboratory Improvement Amendments of 1988 (CLIA), 42 U.S.C. [phone], to perform high complexity tests. The impacts of vaccines, antiviral therapeutics, antibiotics, chemotherapeutic or immunosuppressant drugs have not been evaluated.    Test methodology:   Cepheid Xpert Xpress SARS-CoV-2 Assay real-time polymerase chain reaction (RT-PCR) test on the GeneXpert Dx system.   THYROID STIMULATING HORMONE (SENSITIVE TSH)   Result Value Ref Range    TSH 2.725 0.430 - 3.550 uIU/mL   PT/INR   Result Value Ref Range    PROTHROMBIN TIME 19.7 (H) 9.4 - 12.5 seconds    INR 1.73     Narrative    Coumadin therapy INR range for Conventional Anticoagulation is 2.0 to 3.0 and for Intensive Anticoagulation 2.5 to 3.5.   COMPREHENSIVE METABOLIC PANEL, NON-FASTING   Result Value Ref Range    SODIUM 139 136 - 145 mmol/L    POTASSIUM 3.7 3.5 - 5.1 mmol/L    CHLORIDE 113 (H) 96 - 111 mmol/L    CO2 TOTAL 20 (L) 23 - 31 mmol/L    ANION GAP 6 4 - 13 mmol/L    BUN 21 8 - 25 mg/dL    CREATININE 1.10 (H)  0.60 - 1.05 mg/dL    BUN/CREA RATIO 19 6 - 22    ESTIMATED GFR 55 (L) >=60 mL/min/BSA    ALBUMIN 2.7 (L) 3.4 - 4.8 g/dL     CALCIUM 8.8 8.8 - 10.2 mg/dL    GLUCOSE 83 65 - 125 mg/dL    ALKALINE PHOSPHATASE 63 55 - 145 U/L    ALT (SGPT) 23 (H) 8 - 22 U/L    AST (SGOT)  24 8 - 45 U/L    BILIRUBIN TOTAL 0.4 0.3 - 1.3 mg/dL    PROTEIN TOTAL 6.5 6.0 - 8.0 g/dL   CBC/DIFF    Narrative    The following orders were created for panel order CBC/DIFF.  Procedure                               Abnormality         Status                     ---------                               -----------         ------                     CBC WITH TGPQ[[id number]]  Abnormal            Final result               MANUAL DIFF AND MORPHOLO.Marland KitchenMarland Kitchen[[id number]]  Abnormal            Final result                 Please view results for these tests on the individual orders.   MAGNESIUM   Result Value Ref Range    MAGNESIUM 1.6 (L) 1.8 - 2.6 mg/dL   PHOSPHORUS   Result Value Ref Range    PHOSPHORUS 2.8 2.3 - 4.0 mg/dL   CBC WITH DIFF   Result Value Ref Range    WBC 2.9 (L) 3.7 - 11.0 x10^3/uL    RBC 3.01 (L) 3.85 - 5.22 x10^6/uL    HGB 9.6 (L) 11.5 - 16.0 g/dL    HCT 28.2 (L) 34.8 - 46.0 %    MCV 93.7 78.0 - 100.0 fL    MCH 31.9 26.0 - 32.0 pg    MCHC 34.0 31.0 - 35.5 g/dL    RDW-CV 17.0 (H) 11.5 - 15.5 %    PLATELETS 89 (L) 150 - 400 x10^3/uL    MPV 10.8 8.7 - 12.5 fL   MANUAL DIFF AND MORPHOLOGY-SYSMEX   Result Value Ref Range    NEUTROPHIL % 61 %    LYMPHOCYTE %  8 %    MONOCYTE % 20 %    EOSINOPHIL % 0 %    BASOPHIL % 1 %    NEUTROPHIL BANDS % 3 %    METAMYELOCYTE %  1 %    MYELOCYTE % 5 %    REACTIVE LYMPHOCYTE % 1 %    NEUTROPHIL # 1.86 1.50 - 7.70 x10^3/uL    LYMPHOCYTE # 0.26 (L) 1.00 - 4.80 x10^3/uL    MONOCYTE # 0.58 0.20 - 1.10 x10^3/uL    EOSINOPHIL # <0.10 <=0.50 x10^3/uL    BASOPHIL # <0.10 <=0.20 x10^3/uL    RBC MORPHOLOGY Normal RBC and PLT Morphology    TYPE AND CROSS RED CELLS - UNITS , 2 Units   Result Value Ref Range    UNITS  ORDERED 2     SPECIMEN EXPIRATION DATE 03/19/2021,2359     ABO/RH(D) O POSITIVE     ANTIBODY SCREEN NEGATIVE    BPAM PACKED CELL ORDER   Result Value Ref Range    Coding System [id number]     UNIT NUMBER [id number]     BLOOD COMPONENT TYPE LR RBC, Adsol3, 04761     UNIT DIVISION 00     UNIT DISPENSE STATUS ISSUED,FINAL     TRANSFUSION STATUS OK TO TRANSFUSE     IS CROSSMATCH COMPATIBLE     Product Code X1062I94     Coding System [id number]     UNIT NUMBER [id number]     BLOOD COMPONENT TYPE LR RBC, Adsol1, 04710     UNIT DIVISION 00     UNIT DISPENSE STATUS ISSUED,FINAL     TRANSFUSION STATUS OK TO TRANSFUSE     IS CROSSMATCH COMPATIBLE     Product Code G1829H37        Radiology: All recent pertinent radiologic images and reports reviewed          Assessment/Plan:     1. Non-small cell lung cancer, right (CMS [facility])  65 y/o woman with metastatic NSCLC who is tolerating palliative chemotherapy.  Her overall condition remains marginal with no significant change in  mobility.  She completed cycle 9 of Carboplatin/Pemedtrexed on 02/28/2021 with 3 days of neupogen.  Did not benefit great from PT.  My goal remain current low dose therapy as tolerated.  Recent CT showed stable disease.     2. 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    3. Drug-induced pancytopenia (CMS HCC)  Labs reviewed and will need additional neupogen and consider PRBC transfusion.      Return in about 1 week (around 03/12/2021).    Mertie Moores, MD        CC:  PCP General:  Lanier Ensign, FNP-C  [address]  [place] [address]      Portions of this note may be dictated using voice recognition software or a dictation service. Variances in spelling and vocabulary are possible and unintentional. Not all errors are caught/corrected. Please notify the [facility] if any discrepancies are noted or if the meaning of any statement is not clear.

## 2021-03-05 NOTE — ED Triage Notes (Addendum)
Hx of metastatic lung CA- last chemo/radiation 9/15. Husband states injections for blood count on Fri, Sat, Sun. Had infusion at [facility] today. Husband reports pt more tired than baseline today. Fever 103.4 tonight.

## 2021-03-05 NOTE — ED Provider Notes (Signed)
Gina Chamber, MD  [facility]  Emergency Department Visit Note    Date:  03/05/2021  Primary care provider:  Lanier Ensign, FNP-C  Means of arrival:  private car  History obtained from: patient and spouse/SO  History limited by: none    Chief Complaint:  Fever    HISTORY OF PRESENT ILLNESS     Gina Barrett, date of birth January 07, 1956, is a 65 y.o. female who presents to the Emergency Department complaining of a fever. The patient's husband states that the patient currently has metastatic lung cancer among other conditions, and she states that she has been treated with chemotherapy for this condition. She states that her last treatment was on 02/28/21, and the husband states that the patient had an injection for blood counts this past weekend. He states that she also had an infusion at the [facility] earlier in the day today. He states that throughout the day today the patient became increasingly fatigued compared to baseline, and he notes that the patient measured a fever of around 103.4 F at home prior to arrival to the ED. He states that the patient has continued to measure a fever and complain of worsening fatigue, and she denies any alleviation prior to arrival to the ED. She denies symptoms of nausea, vomiting, diarrhea, syncope, fever, headaches, chest pain, abdominal pain, and any urinary or bowel issues.     REVIEW OF SYSTEMS     The pertinent positive and negative symptoms are as per HPI. All other systems reviewed and are negative.     PATIENT HISTORY     Past Medical History:  Past Medical History:   Diagnosis Date   . Abnormal Pap smear     ASCUS, ASCUS cannot rule out HGSIL   . Cancer (CMS HCC)     non small cell lung cancer (R)-s/p Chemo/Radiation completed 8/18   . Colon polyp    . Dysphagia    . Hypothyroid    . Non-small cell lung cancer, right (CMS [place])     s/p Chemo completed 9/18   . Raynaud's disease    . STD (sexually transmitted disease)     HSV   . Unspecified  disorder of thyroid     Hypothyroidism     Past Surgical History:  Past Surgical History:   Procedure Laterality Date   . Hx cataract removal Bilateral 2011   . Hx colonoscopy  2009   . Hx elbow surgery     . Hx foot surgery     . Hx tubal ligation  1978   . Lung biopsy Right 10/02/2016   . Mediastinoscopy  11/13/2016   . Pb forearm/wrist surgery unlisted  1999   . Portacath placement  12/12/2016     Family History:  Family Medical History:     Problem Relation (Age of Onset)    Breast Cancer Mother    Diabetes Paternal Grandfather, Brother    No Known Problems Father, Sister, Maternal Grandmother, Maternal Grandfather, Paternal 2, Daughter, Son, Maternal Aunt, Maternal Uncle, Paternal 55, Paternal Uncle, Other        Social History:  Social History     Tobacco Use   . Smoking status: Former Smoker     Years: 20.00     Types: Cigarettes     Start date: 12/09/1970     Quit date: 09/08/1998     Years since quitting: 22.5   . Smokeless tobacco: Never Used   Substance  Use Topics   . Alcohol use: No   . Drug use: No     Social History     Substance and Sexual Activity   Drug Use No     Medications:  Current Outpatient Medications   Medication Sig   . Artifi. Tear, Hypromellose,-PF (RETAINE [facility], PF,) 0.3 % Ophthalmic Drops Administer into affected eye(s) Once per day as needed   . Calcium Carbonate 500 mg calcium (1,250 mg) Oral Tablet, Chewable Chew 1 Tablet (500 mg total) Once a day   . carboxymethyl/glycerin/poly80 (REFRESH OPTIVE ADVANCED OPHT) Apply to eye Once per day as needed   . carboxymethylcellulose Sodium (THERATEARS) 0.25 % Ophthalmic Drops 1-2 Drops Once per day as needed   . folic acid (FOLVITE) 1 mg Oral Tablet TAKE ONE TABLET BY MOUTH EVERY DAY   . levothyroxine (SYNTHROID) 88 mcg Oral Tablet TAKE ONE TABLET BY MOUTH EVERY DAY   . loratadine (CLARITIN) 10 mg Oral Tablet take 10 mg by mouth Once a day.   Marland Kitchen MEDIHONEY, HONEY, 80 % Gel Twice daily to affected area(s)   . megestroL (MEGACE) 40 mg  Oral Tablet Take 40 mg by mouth   . mirtazapine (REMERON) 15 mg Oral Tablet TAKE ONE TABLET BY MOUTH EVERY EVENING   . MULTIVITAMIN ORAL Take by mouth Once a day Centrum Silver Women's 50+   . nebivoloL (BYSTOLIC) 5 mg Oral Tablet    . Nifedipine (ADALAT CC) 30 mg Oral Tablet Sustained Release take 30 mg by mouth Once a day.   . ondansetron (ZOFRAN ODT) 8 mg Oral Tablet, Rapid Dissolve Place 1 Tablet (8 mg total) under the tongue Every 8 hours as needed for Nausea/Vomiting Indications: nausea and vomiting caused by cancer drugs   . oxyCODONE-acetaminophen (PERCOCET) 10-325 mg Oral Tablet Take 1 Tablet by mouth Every 6 hours as needed for Pain for up to 30 days   . polyethylene glycol (MIRALAX) 17 gram Oral Powder in Packet Take 1 Packet (17 g total) by mouth Once a day   . prochlorperazine (COMPAZINE) 10 mg Oral Tablet Take 1 Tablet (10 mg total) by mouth Four times a day as needed for Nausea/Vomiting   . SENNA PLUS 8.6-50 mg Oral Tablet TAKE ONE TABLET BY MOUTH EVERY EVENING   . tiotropium-olodateroL (STIOLTO RESPIMAT) 2.5-2.5 mcg/actuation Inhalation Mist Take 2 INHALATION by inhalation Once a day   . UNKNOWN MEDICATION (UNKNOWN MEDICATION) Administer into affected eye(s) [name] 128 Eye Drops OTC 4x daily  [name] 128 Eye Gel OTC Nightly     Allergies:  Allergies   Allergen Reactions   . Penicillins      PHYSICAL EXAM     Vitals:  ED Triage Vitals [03/05/21 2248]   BP (Non-Invasive) (!) 168/69   Heart Rate (!) 114   Respiratory Rate 20   Temperature (!) 39.3 C (102.8 F)   SpO2 96 %   Weight 47.2 kg (104 lb)   Height 1.549 m (5\' 1" )     Pulse ox  96% on None (Room Air) interpreted by me as: Normal    Constitutional: The patient is alert and oriented to person, place, and time. Complains of generalized fatigue.   HENT: Atraumatic, normocephalic head. Mucous membranes moist. TM's clear, Nares unremarkable. Oropharynx shows no erythema or exudate.   Eyes: Pupils equal and round, reactive to light. No scleral icterus.  Normal conjunctiva. Extraocular movements are intact.  Neck: Supple, non-tender, no nuchal rigidity, no adenopathy.   Lungs: Coarse breath sounds at the  bases. Symmetric and equal expansion. No respiratory distress or retractions.  Cardiovascular: Heart is S1-S2 regular rate and regular rhythm without murmur click or rub.  Abdomen:  Soft, non-distended. No tenderness to palpation without evidence of rebound or guarding. No pulsatile masses. No organomegaly.   Genitourinary: No CVA tenderness.  Extremities: Full range of motion, no clubbing, cyanosis, or edema. Pulses 2+, capillary refill <2 seconds.  Spine: No midline or paraspinal muscle tenderness to palpation. No step-off.   Skin: Warm and dry. No cyanosis, jaundice, rash or lesion.  Neurologic: Alert and oriented x3. Normal facial symmetry and speech, Normal upper and lower extremity strength, and grossly normal sensation.     DIFFERENTIAL DIAGNOSES     1. Neutropenic fever  2. Pneumonia  3. Hyper/hypoglycemia  4. Metastatic lung cancer    DIAGNOSTIC STUDIES     Labs:    Results for orders placed or performed during the hospital encounter of 03/05/21   COMPREHENSIVE METABOLIC PANEL, NON-FASTING   Result Value Ref Range    SODIUM 136 136 - 145 mmol/L    POTASSIUM 5.2 (H) 3.5 - 5.1 mmol/L    CHLORIDE 107 96 - 111 mmol/L    CO2 TOTAL 21 (L) 23 - 31 mmol/L    ANION GAP 8 4 - 13 mmol/L    BUN 28 (H) 8 - 25 mg/dL    CREATININE 1.24 (H) 0.60 - 1.05 mg/dL    BUN/CREA RATIO 23 (H) 6 - 22    ESTIMATED GFR 48 (L) >=60 mL/min/BSA    ALBUMIN 3.5 3.4 - 4.8 g/dL     CALCIUM 8.9 8.8 - 10.2 mg/dL    GLUCOSE 92 65 - 125 mg/dL    ALKALINE PHOSPHATASE 82 55 - 145 U/L    ALT (SGPT) 33 (H) 8 - 22 U/L    AST (SGOT)  40 8 - 45 U/L    BILIRUBIN TOTAL 0.4 0.3 - 1.3 mg/dL    PROTEIN TOTAL 7.5 6.0 - 8.0 g/dL   LACTIC ACID LEVEL W/ REFLEX FOR LEVEL >2.0   Result Value Ref Range    LACTIC ACID 1.5 0.5 - 2.2 mmol/L   PROCALCITONIN, SERUM   Result Value Ref Range    PROCALCITONIN 0.12 <0.50  ng/mL   PT/INR - STAT - NOW   Result Value Ref Range    PROTHROMBIN TIME 12.9 (H) 9.4 - 12.5 seconds    INR 1.16    PTT (PARTIAL THROMBOPLASTIN TIME) - STAT - NOW   Result Value Ref Range    APTT 25.0 24.0 - 36.5 seconds   TROPONIN-I - STAT - NOW   Result Value Ref Range    TROPONIN I <7 (L) 7 - 30 ng/L   COVID-19 Screening - COVID Only   Result Value Ref Range    SARS-CoV-2 Not Detected Not Detected   CBC WITH DIFF   Result Value Ref Range    WBC 0.4 (L) 3.7 - 11.0 x10^3/uL    RBC 2.56 (L) 3.85 - 5.22 x10^6/uL    HGB 8.4 (L) 11.5 - 16.0 g/dL    HCT 26.7 (L) 34.8 - 46.0 %    MCV 104.3 (H) 78.0 - 100.0 fL    MCH 32.8 (H) 26.0 - 32.0 pg    MCHC 31.5 31.0 - 35.5 g/dL    RDW-CV 15.6 (H) 11.5 - 15.5 %    PLATELETS 143 (L) 150 - 400 x10^3/uL    MPV 10.3 8.7 - 12.5 fL     Labs  reviewed and interpreted by me.    Cultures:  BLOOD CULTURE - [place] - results pending at time of disposition  BLOOD CULTURE - [place] - results pending at time of disposition  URINE CULTURE - [place] - results pending at time of disposition    Radiology:    XR AP MOBILE CHEST   Final Result      Hyperinflation and chronic appearing interstitial change, superimposed infiltrate is difficult to exclude. Consider short-term follow-up.         Radiologist location ID: [id number]           Radiological imaging interpreted by radiologist and reviewed by me.    EKG:  12 lead EKG interpreted by me shows sinus tachycardia, rate of 118 bpm, normal axis, normal intervals, no acute ST segment changes.     ED PROGRESS NOTE / [place] records reviewed by me:  I have reviewed the nurse's notes. I have reviewed the patient's problem list and pertinent past medical records.     Orders Placed This Encounter   . ADULT ROUTINE BLOOD CULTURE, SET OF 2 BOTTLES (BACTERIA AND YEAST)   . ADULT ROUTINE BLOOD CULTURE, SET OF 2 BOTTLES (BACTERIA AND YEAST)   . URINE CULTURE   . XR AP MOBILE CHEST   . COMPREHENSIVE METABOLIC PANEL, NON-FASTING   .  LACTIC ACID LEVEL W/ REFLEX FOR LEVEL >2.0   . PROCALCITONIN, SERUM   . PT/INR - STAT - NOW   . PTT (PARTIAL THROMBOPLASTIN TIME) - STAT - NOW   . TROPONIN-I - STAT - NOW   . URINALYSIS WITH MICROSCOPIC REFLEX IF INDICATED [facility] ONLY   . COVID-19 Screening - COVID Only   . CBC WITH DIFF   . ECG 12-LEAD   . INSERT & MAINTAIN PERIPHERAL IV ACCESS (14G or 16G: 2 SITES)   . NS bolus infusion 30 mL/kg (Ideal) = 1,434 mL   . acetaminophen (OFIRMEV) 10 mg/mL IV 708 mg   . amikacin (AMIKIN) 700 mg in NS 250 mL IVPB          22:52: Initial evaluation is complete at this time. I discussed with the patient that I would order blood cultures, urine cultures, UA, ECG, PT/INR, PTT, Lactic Acid, CBC, CMP, Troponin, chest XR, COVID-19 screening, and a Procalcitonin to further evaluate. NS bolus fluids ordered for patient symptoms. Patient is agreeable with the treatment plan at this time.     23:44: Acetaminophen ordered for patient symptoms.     00:45: Work up reviewed at this time. Due to the patient's condition and diagnostic study results, they will require admission to the hospital for further evaluation and treatment. Hospitalist paged for admission.     00:52: I discussed the patient's case and above findings with Jobe Igo APRN [facility]) who is making arrangements for further monitoring and definitive care in the hospital. Amikacin ordered for patient symptoms.     MIPS     Not applicable     OPIATE PRESCRIPTION      Not applicable    CORE MEASURES     Not applicable    CRITICAL CARE TIME     Total Critical Care Time for this patient is 60 minutes and it is exclusive of procedural time.    PRE-DISPOSITION VITALS      Pre-Disposition Vitals:  Filed Vitals:    03/06/21 0012 03/06/21 0015 03/06/21 0030 03/06/21 0045   BP:  135/62 (!) 128/58 Marland Kitchen)  140/66   Pulse:  (!) 109 (!) 110 (!) 108   Resp:  17 (!) 23 (!) 21   Temp: (!) 38.9 C (102 F)   38 C (100.4 F)   SpO2:  100% 99% 97%     CLINICAL IMPRESSION     Encounter  Diagnoses   Name Primary?   . Neutropenic fever (CMS [place]) Yes   . Non-small cell lung cancer, right (CMS [facility])       DISPOSITION/PLAN     Admitted          Condition at Disposition: [place] scribed for Gina Chamber, MD on 03/05/2021 at 10:48 PM.       Documentation assistance provided for Gina Chamber, MD  by Royanne Foots II, SCRIBE. Information recorded by the scribe was done at my direction and has been reviewed and validated by me Gina Chamber, MD.

## 2021-03-05 NOTE — Nurses Notes (Signed)
Cr cl 36.9  Corrected Ca 9.1

## 2021-03-06 ENCOUNTER — Encounter (HOSPITAL_COMMUNITY): Payer: Self-pay | Admitting: Emergency Medicine

## 2021-03-06 DIAGNOSIS — R5081 Fever presenting with conditions classified elsewhere: Secondary | ICD-10-CM | POA: Insufficient documentation

## 2021-03-06 DIAGNOSIS — D61810 Antineoplastic chemotherapy induced pancytopenia: Secondary | ICD-10-CM

## 2021-03-06 DIAGNOSIS — N179 Acute kidney failure, unspecified: Secondary | ICD-10-CM

## 2021-03-06 DIAGNOSIS — C7951 Secondary malignant neoplasm of bone: Secondary | ICD-10-CM

## 2021-03-06 DIAGNOSIS — C3490 Malignant neoplasm of unspecified part of unspecified bronchus or lung: Secondary | ICD-10-CM

## 2021-03-06 DIAGNOSIS — D709 Neutropenia, unspecified: Principal | ICD-10-CM | POA: Diagnosis present

## 2021-03-06 LAB — TROPONIN-I: TROPONIN I: <7 ng/L — ABNORMAL LOW (ref 7–30)

## 2021-03-06 LAB — URINALYSIS WITH MICROSCOPIC REFLEX IF INDICATED BMC/JMC ONLY
BILIRUBIN: NEGATIVE mg/dL
GLUCOSE: NEGATIVE mg/dL
KETONES: NEGATIVE mg/dL
LEUKOCYTES: NEGATIVE WBCs/uL
NITRITE: NEGATIVE
PH: 7 (ref <8.0)
SPECIFIC GRAVITY: 1.01 (ref <1.022)
UROBILINOGEN: 0.2 mg/dL (ref <=2.0)

## 2021-03-06 LAB — COMPREHENSIVE METABOLIC PANEL, NON-FASTING
ALBUMIN: 3.5 g/dL (ref 3.4–4.8)
ALKALINE PHOSPHATASE: 82 U/L (ref 55–145)
ALT (SGPT): 33 U/L — ABNORMAL HIGH (ref 8–22)
ANION GAP: 8 mmol/L (ref 4–13)
AST (SGOT): 40 U/L (ref 8–45)
BILIRUBIN TOTAL: 0.4 mg/dL (ref 0.3–1.3)
BUN/CREA RATIO: 23 — ABNORMAL HIGH (ref 6–22)
BUN: 28 mg/dL — ABNORMAL HIGH (ref 8–25)
CALCIUM: 8.9 mg/dL (ref 8.8–10.2)
CHLORIDE: 107 mmol/L (ref 96–111)
CO2 TOTAL: 21 mmol/L — ABNORMAL LOW (ref 23–31)
CREATININE: 1.24 mg/dL — ABNORMAL HIGH (ref 0.60–1.05)
ESTIMATED GFR: 48 mL/min/BSA — ABNORMAL LOW (ref >=60)
GLUCOSE: 92 mg/dL (ref 65–125)
POTASSIUM: 5.2 mmol/L — ABNORMAL HIGH (ref 3.5–5.1)
PROTEIN TOTAL: 7.5 g/dL (ref 6.0–8.0)
SODIUM: 136 mmol/L (ref 136–145)

## 2021-03-06 LAB — CBC WITH DIFF
BASOPHIL #: <0.1 10*3/uL (ref <=0.20)
BASOPHIL %: 0 %
EOSINOPHIL #: <0.1 10*3/uL (ref <=0.50)
EOSINOPHIL %: 0 %
HCT: 27.4 % — ABNORMAL LOW (ref 34.8–46.0)
HGB: 8.2 g/dL — ABNORMAL LOW (ref 11.5–16.0)
IMMATURE GRANULOCYTE #: <0.1 10*3/uL (ref <0.10)
IMMATURE GRANULOCYTE %: 4 % — ABNORMAL HIGH (ref 0–1)
LYMPHOCYTE #: <0.1 10*3/uL — ABNORMAL LOW (ref 1.00–4.80)
LYMPHOCYTE %: 20 %
MCH: 32.9 pg — ABNORMAL HIGH (ref 26.0–32.0)
MCHC: 29.9 g/dL — ABNORMAL LOW (ref 31.0–35.5)
MCV: 110 fL — ABNORMAL HIGH (ref 78.0–100.0)
MONOCYTE #: 0.13 10*3/uL — ABNORMAL LOW (ref 0.20–1.10)
MONOCYTE %: 28 %
MPV: 9.5 fL (ref 8.7–12.5)
NEUTROPHIL #: 0.22 10*3/uL — ABNORMAL LOW (ref 1.50–7.70)
NEUTROPHIL %: 48 %
PLATELETS: 99 10*3/uL — ABNORMAL LOW (ref 150–400)
RBC: 2.49 10*6/uL — ABNORMAL LOW (ref 3.85–5.22)
RDW-CV: 15.6 % — ABNORMAL HIGH (ref 11.5–15.5)
WBC: 0.5 10*3/uL — ABNORMAL LOW (ref 3.7–11.0)

## 2021-03-06 LAB — URINALYSIS, MICROSCOPIC

## 2021-03-06 LAB — BASIC METABOLIC PANEL
ANION GAP: 9 mmol/L (ref 4–13)
BUN/CREA RATIO: 24 — ABNORMAL HIGH (ref 6–22)
BUN: 23 mg/dL (ref 8–25)
CALCIUM: 7.8 mg/dL — ABNORMAL LOW (ref 8.8–10.2)
CHLORIDE: 113 mmol/L — ABNORMAL HIGH (ref 96–111)
CO2 TOTAL: 16 mmol/L — ABNORMAL LOW (ref 23–31)
CREATININE: 0.96 mg/dL (ref 0.60–1.05)
ESTIMATED GFR: 65 mL/min/BSA (ref >=60)
GLUCOSE: 98 mg/dL (ref 65–125)
POTASSIUM: 4.2 mmol/L (ref 3.5–5.1)
SODIUM: 138 mmol/L (ref 136–145)

## 2021-03-06 LAB — ECG 12-LEAD
Atrial Rate: 118 {beats}/min
Calculated P Axis: 82 degrees
Calculated R Axis: -29 degrees
Calculated T Axis: 74 degrees
PR Interval: 170 ms
QRS Duration: 64 ms
QT Interval: 308 ms
QTC Calculation: 431 ms
Ventricular rate: 118 {beats}/min

## 2021-03-06 LAB — MRSA COLONIZATION SCREEN, PCR: MRSA COLONIZATION SCREEN: NEGATIVE

## 2021-03-06 LAB — COVID-19 ~~LOC~~ MOLECULAR LAB TESTING: SARS-CoV-2: NOT DETECTED

## 2021-03-06 LAB — PROCALCITONIN: PROCALCITONIN: 0.12 ng/mL (ref <0.50)

## 2021-03-06 MED ORDER — MAGNESIUM HYDROXIDE 400 MG/5 ML ORAL SUSPENSION
30.0000 mL | Freq: Every evening | ORAL | Status: DC | PRN
Start: 2021-03-06 — End: 2021-03-09

## 2021-03-06 MED ORDER — POLYETHYLENE GLYCOL 3350 17 GRAM ORAL POWDER PACKET
17.0000 g | Freq: Every day | ORAL | Status: DC
Start: 2021-03-06 — End: 2021-03-09
  Administered 2021-03-06 – 2021-03-09 (×5): 17 g via ORAL
  Filled 2021-03-06 (×4): qty 1

## 2021-03-06 MED ORDER — IPRATROPIUM 0.5 MG-ALBUTEROL 3 MG (2.5 MG BASE)/3 ML NEBULIZATION SOLN
3.0000 mL | INHALATION_SOLUTION | RESPIRATORY_TRACT | Status: AC
Start: 2021-03-06 — End: 2021-03-07
  Administered 2021-03-06 (×2): 3 mL via RESPIRATORY_TRACT
  Administered 2021-03-06: 0 mL via RESPIRATORY_TRACT
  Administered 2021-03-06 – 2021-03-07 (×3): 3 mL via RESPIRATORY_TRACT
  Filled 2021-03-06 (×4): qty 3

## 2021-03-06 MED ORDER — FILGRASTIM 300 MCG/ML INJECTION SOLUTION
300.0000 ug | Freq: Every day | INTRAMUSCULAR | Status: DC
Start: 2021-03-06 — End: 2021-03-09
  Administered 2021-03-06 – 2021-03-08 (×3): 300 ug via SUBCUTANEOUS
  Filled 2021-03-06 (×6): qty 1

## 2021-03-06 MED ORDER — LORATADINE 10 MG TABLET
10.0000 mg | ORAL_TABLET | Freq: Every day | ORAL | Status: DC
Start: 2021-03-06 — End: 2021-03-09
  Administered 2021-03-06 – 2021-03-09 (×4): 10 mg via ORAL
  Filled 2021-03-06 (×4): qty 1

## 2021-03-06 MED ORDER — SODIUM CHLORIDE 0.9 % INTRAVENOUS SOLUTION
7.5000 mg/kg | Freq: Three times a day (TID) | INTRAVENOUS | Status: DC
Start: 2021-03-06 — End: 2021-03-06

## 2021-03-06 MED ORDER — MIRTAZAPINE 15 MG TABLET
15.0000 mg | ORAL_TABLET | Freq: Every evening | ORAL | Status: DC
Start: 2021-03-06 — End: 2021-03-09
  Administered 2021-03-06 – 2021-03-08 (×3): 15 mg via ORAL
  Filled 2021-03-06 (×3): qty 1

## 2021-03-06 MED ORDER — NITROGLYCERIN 0.4 MG SUBLINGUAL TABLET
0.4000 mg | SUBLINGUAL_TABLET | SUBLINGUAL | Status: DC | PRN
Start: 2021-03-06 — End: 2021-03-09

## 2021-03-06 MED ORDER — SODIUM CHLORIDE 0.9 % INTRAVENOUS PIGGYBACK
2.0000 g | Freq: Two times a day (BID) | INTRAVENOUS | Status: DC
  Administered 2021-03-06: 2 g via INTRAVENOUS
  Administered 2021-03-06 (×2): 0 g via INTRAVENOUS
  Administered 2021-03-06 – 2021-03-07 (×2): 2 g via INTRAVENOUS
  Administered 2021-03-07 (×3): 0 g via INTRAVENOUS
  Administered 2021-03-08: 2 g via INTRAVENOUS
  Administered 2021-03-08 (×2): 0 g via INTRAVENOUS
  Administered 2021-03-08: 2 g via INTRAVENOUS
  Administered 2021-03-09: 0 g via INTRAVENOUS
  Administered 2021-03-09: 2 g via INTRAVENOUS
  Filled 2021-03-06 (×7): qty 12.5

## 2021-03-06 MED ORDER — BUDESONIDE 0.5 MG/2 ML SUSPENSION FOR NEBULIZATION
1.0000 mg | INHALATION_SUSPENSION | Freq: Two times a day (BID) | RESPIRATORY_TRACT | Status: DC
Start: 2021-03-06 — End: 2021-03-06

## 2021-03-06 MED ORDER — BUDESONIDE 0.5 MG/2 ML SUSPENSION FOR NEBULIZATION
1.0000 mg | INHALATION_SUSPENSION | Freq: Two times a day (BID) | RESPIRATORY_TRACT | Status: DC
Start: 2021-03-06 — End: 2021-03-09
  Administered 2021-03-06 – 2021-03-07 (×4): 1 mg via RESPIRATORY_TRACT
  Administered 2021-03-08: 0.5 mg via RESPIRATORY_TRACT
  Administered 2021-03-08 – 2021-03-09 (×2): 1 mg via RESPIRATORY_TRACT
  Filled 2021-03-06 (×5): qty 4

## 2021-03-06 MED ORDER — POLYVINYL ALCOHOL 1.4 % EYE DROPS
1.0000 [drp] | OPHTHALMIC | Status: DC | PRN
Start: 2021-03-06 — End: 2021-03-09
  Filled 2021-03-06: qty 15

## 2021-03-06 MED ORDER — CALCIUM 200 MG (AS CALCIUM CARBONATE 500 MG) CHEWABLE TABLET
500.0000 mg | CHEWABLE_TABLET | Freq: Every day | ORAL | Status: DC
Start: 2021-03-06 — End: 2021-03-08
  Administered 2021-03-06: 0 mg via ORAL
  Administered 2021-03-07: 500 mg via ORAL
  Filled 2021-03-06 (×2): qty 1

## 2021-03-06 MED ORDER — ENOXAPARIN 40 MG/0.4 ML SUB-Q SYRINGE - EAST
40.0000 mg | INJECTION | Freq: Every day | SUBCUTANEOUS | Status: DC
Start: 2021-03-06 — End: 2021-03-09
  Administered 2021-03-06: 40 mg via SUBCUTANEOUS
  Administered 2021-03-06: 0 mg via SUBCUTANEOUS
  Administered 2021-03-07: 40 mg via SUBCUTANEOUS
  Administered 2021-03-08 – 2021-03-09 (×2): 0 mg via SUBCUTANEOUS
  Filled 2021-03-06 (×3): qty 0.4

## 2021-03-06 MED ORDER — ARFORMOTEROL 15 MCG/2 ML SOLUTION FOR NEBULIZATION
15.0000 ug | INHALATION_SOLUTION | Freq: Two times a day (BID) | RESPIRATORY_TRACT | Status: DC
Start: 2021-03-06 — End: 2021-03-09
  Administered 2021-03-06 – 2021-03-09 (×7): 15 ug via RESPIRATORY_TRACT
  Filled 2021-03-06 (×5): qty 1

## 2021-03-06 MED ORDER — SODIUM CHLORIDE 0.9 % (FLUSH) INJECTION SYRINGE
10.0000 mL | INJECTION | Freq: Three times a day (TID) | INTRAMUSCULAR | Status: DC
Start: 2021-03-06 — End: 2021-03-09
  Administered 2021-03-06 (×2): 0 mL via INTRAVENOUS
  Administered 2021-03-06 – 2021-03-09 (×8): 10 mL via INTRAVENOUS

## 2021-03-06 MED ORDER — ACETAMINOPHEN 500 MG TABLET
1000.0000 mg | ORAL_TABLET | Freq: Four times a day (QID) | ORAL | Status: DC | PRN
Start: 2021-03-06 — End: 2021-03-09
  Administered 2021-03-06 – 2021-03-07 (×2): 1000 mg via ORAL
  Filled 2021-03-06 (×2): qty 2

## 2021-03-06 MED ORDER — FOLIC ACID 1 MG TABLET
1.0000 mg | ORAL_TABLET | Freq: Every day | ORAL | Status: DC
Start: 2021-03-06 — End: 2021-03-09
  Administered 2021-03-06 – 2021-03-09 (×5): 1 mg via ORAL
  Filled 2021-03-06 (×4): qty 1

## 2021-03-06 MED ORDER — SODIUM CHLORIDE 0.9% FLUSH BAG - 250 ML
INTRAVENOUS | Status: DC | PRN
Start: 2021-03-06 — End: 2021-03-09

## 2021-03-06 MED ORDER — ARFORMOTEROL 15 MCG/2 ML SOLUTION FOR NEBULIZATION
15.0000 ug | INHALATION_SOLUTION | Freq: Two times a day (BID) | RESPIRATORY_TRACT | Status: DC
Start: 2021-03-06 — End: 2021-03-06

## 2021-03-06 MED ORDER — LEVOTHYROXINE 88 MCG TABLET
88.0000 ug | ORAL_TABLET | Freq: Every day | ORAL | Status: DC
Start: 2021-03-06 — End: 2021-03-09
  Administered 2021-03-06 – 2021-03-09 (×4): 88 ug via ORAL
  Filled 2021-03-06 (×6): qty 1

## 2021-03-06 MED ORDER — SODIUM CHLORIDE 0.9 % INTRAVENOUS SOLUTION
15.0000 mg/kg | INTRAVENOUS | Status: AC
Start: 2021-03-06 — End: 2021-03-06
  Administered 2021-03-06: 700 mg via INTRAVENOUS
  Administered 2021-03-06 (×2): 0 mg/kg via INTRAVENOUS
  Filled 2021-03-06: qty 2.8

## 2021-03-06 MED ORDER — OXYCODONE-ACETAMINOPHEN 10 MG-325 MG TABLET
1.0000 | ORAL_TABLET | Freq: Four times a day (QID) | ORAL | Status: DC | PRN
Start: 2021-03-06 — End: 2021-03-09

## 2021-03-06 MED ORDER — NIFEDIPINE ER 30 MG TABLET,EXTENDED RELEASE 24 HR
30.0000 mg | EXTENDED_RELEASE_TABLET | Freq: Every day | ORAL | Status: DC
Start: 2021-03-06 — End: 2021-03-09
  Administered 2021-03-06 – 2021-03-09 (×3): 0 mg via ORAL
  Filled 2021-03-06: qty 1

## 2021-03-06 MED ORDER — MEGESTROL 400 MG/10 ML (40 MG/ML) ORAL SUSPENSION
40.0000 mg | Freq: Two times a day (BID) | ORAL | Status: DC
Start: 2021-03-06 — End: 2021-03-09
  Administered 2021-03-06 – 2021-03-09 (×7): 40 mg via ORAL
  Filled 2021-03-06 (×11): qty 1

## 2021-03-06 MED ORDER — SODIUM CHLORIDE 0.9 % (FLUSH) INJECTION SYRINGE
10.0000 mL | INJECTION | INTRAMUSCULAR | Status: DC | PRN
Start: 2021-03-06 — End: 2021-03-09

## 2021-03-06 MED ORDER — SENNOSIDES 8.6 MG-DOCUSATE SODIUM 50 MG TABLET
1.0000 | ORAL_TABLET | Freq: Every evening | ORAL | Status: DC
Start: 2021-03-06 — End: 2021-03-09
  Administered 2021-03-06 – 2021-03-08 (×3): 1 via ORAL
  Filled 2021-03-06 (×3): qty 1

## 2021-03-06 MED ORDER — NEBIVOLOL 5 MG TABLET
5.0000 mg | ORAL_TABLET | Freq: Every day | ORAL | Status: DC
Start: 2021-03-06 — End: 2021-03-08
  Administered 2021-03-06 – 2021-03-08 (×3): 5 mg via ORAL
  Filled 2021-03-06 (×6): qty 1

## 2021-03-06 MED ORDER — SODIUM CHLORIDE 0.9 % INTRAVENOUS SOLUTION
INTRAVENOUS | Status: AC
Start: 2021-03-06 — End: 2021-03-06

## 2021-03-06 NOTE — ED Nurses Note (Signed)
Report given to [facility] in ICU.

## 2021-03-06 NOTE — Care Plan (Signed)
Problem: Adult Inpatient Plan of Care  Goal: Plan of Care Review  Outcome: Ongoing (see interventions/notes)  Goal: Patient-Specific Goal (Individualized)  Outcome: Ongoing (see interventions/notes)  Goal: Absence of Hospital-Acquired Illness or Injury  Outcome: Ongoing (see interventions/notes)  Intervention: Identify and Manage Fall Risk  Recent Flowsheet Documentation  Taken 03/06/2021 0845 by Ilda Mori, RN  Safety Promotion/Fall Prevention:   activity supervised   fall prevention program maintained   safety round/check completed   nonskid shoes/slippers when out of bed  Intervention: Prevent Skin Injury  Recent Flowsheet Documentation  Taken 03/06/2021 1145 by Ilda Mori, RN  Body Position: (refused to get turn)   fowlers ( 45-60 degrees)   neutral head position, midline maintained   weight shift assistance provided   positioned/repositioned independently   other (see comments)  Taken 03/06/2021 0845 by Ilda Mori, RN  Body Position:   foot of bed elevated   semi-fowlers (less than 30 degrees)   neutral head position, midline maintained   positioned/repositioned independently   heels elevated off mattress  Intervention: Prevent and Manage VTE (Venous Thromboembolism) Risk  Recent Flowsheet Documentation  Taken 03/06/2021 0845 by Ilda Mori, RN  VTE Prevention/Management: dorsiflexion/plantar flexion performed  Goal: Optimal Comfort and Wellbeing  Outcome: Ongoing (see interventions/notes)  Intervention: Provide Person-Centered Care  Recent Flowsheet Documentation  Taken 03/06/2021 0845 by Ilda Mori, [place] Relationship/Rapport:   care explained   choices provided   emotional support provided   empathic listening provided   reassurance provided   questions encouraged   questions answered   thoughts/feelings acknowledged  Goal: Rounds/Family Conference  Outcome: Ongoing (see interventions/notes)     Problem: Fever  Goal: Body Temperature in Desired  Range  Outcome: Ongoing (see interventions/notes)     Problem: Skin Injury Risk Increased  Goal: Skin Health and Integrity  Outcome: Ongoing (see interventions/notes)  Intervention: Optimize Skin Protection  Recent Flowsheet Documentation  Taken 03/06/2021 1145 by Ilda Mori, RN  Head of Bed [facility]) Positioning: HOB at 30-45 degrees  Taken 03/06/2021 0845 by Ilda Mori, RN  Pressure Reduction Techniques:   (L,M,H,VH) Manage Moisture, Nutrition & Shear   (L,M,H,VH) Maximal Remobilization   frequent weight shift encouraged  Pressure Reduction Devices:   (L.M,H,VH) Specialty Bed utilized   (M,H,VH) Use Repositioning Devices or Pillows  Skin Protection:   adhesive use limited   antimicrobial wipes   incontinence pads utilized   tubing/devices free from skin contact  Head of Bed (HOB) Positioning: 30 degrees     Problem: Fall Injury Risk  Goal: Absence of Fall and Fall-Related Injury  Outcome: Ongoing (see interventions/notes)  Intervention: Identify and Manage Contributors  Recent Flowsheet Documentation  Taken 03/06/2021 0845 by Ilda Mori, RN  Self-Care Promotion:   independence encouraged   BADL personal objects within reach  Intervention: Promote [place] Documentation  Taken 03/06/2021 0845 by Ilda Mori, RN  Safety Promotion/Fall Prevention:   activity supervised   fall prevention program maintained   safety round/check completed   nonskid shoes/slippers when out of bed     Problem: Fever (Fever with Neutropenia)  Goal: Baseline Body Temperature  Outcome: Ongoing (see interventions/notes)     Problem: Infection Risk (Fever with Neutropenia)  Goal: Absence of Infection  Outcome: Ongoing (see interventions/notes)  Intervention: Prevent Infection and Maximize Resistance  Recent Flowsheet Documentation  Taken 03/06/2021 1145 by Ilda Mori, RN  Bathing/Skin Care:   per care provider  x2   bath, complete   incontinence care   linen  changed  Oral Care:   oral care provided   oral rinse provided  Perineal Care:   absorbent pad changed   perineum cleansed  Taken 03/06/2021 0845 by Ilda Mori, RN  Bathing/Skin Care:   antimicrobial wipes   per patient with minimal assistance  Oral Care:   oral rinse provided   oral care provided

## 2021-03-06 NOTE — ED Nurses Note (Signed)
Attempted to call report to 4N. Nurse on floor advised pt no longer coming to that room.

## 2021-03-06 NOTE — Care Management Notes (Signed)
03/06/21 1400   Assessment Detail   Assessment Type Admission   Date of Care Management Update 03/06/21   Social Work Plan   Discharge Planning Status initial meeting   Projected Discharge Date 03/08/21   Anticipated Discharge Disposition Home   Discharge Needs Assessment   Equipment Currently Used at Home wheelchair;walker, rolling;cane, straight   Referral Information   Admission Type inpatient   Arrived From home or self-care   Address Verified verified-no changes   Insurance Verified verified-no change   Source of Information Patient;Spouse   Reason for Consult discharge planning     Discharge planning- spoke with pt/spouse about discharge needs.  Pt lives with her spouse in the basement house with their son/DIL.  Pt is normally independent to get up to the bathroom with a FWW (all the time).  Pt's DIL helps her bath, and dress.  Durable medical equipment used in the home includes; FWW (all the time) w/c (when out of the house), and cane (rare use).  Pt has not driven since Dex 2035, and spouse provides transportation.  Prescriptions are obtained from [facility].  Pt is active with PCP Lanier Ensign FNP-C, with visits every 6 months.  She also sees Dr. Holley Dexter (oncologist) every 3 months.  She receives [facility] 3 weeks [facility].    Discharge plan-home with no anticipated discharge needs.

## 2021-03-06 NOTE — H&P (Addendum)
[facility]  [place], [place] [id number]    General Admission History and Physical  Jobe Igo, FNP-C        Chauntelle, Azpeitia  Date of Admission:  03/05/2021  Date of Birth:  1955-10-17    PCP: Lanier Ensign, FNP-C    COVID status  Has the patient ever had a COVID infection? no  Symptom onset: approximately 9/20   Has the patient had a COVID vaccination? Yes, partially; Pfizer x 2 doses      Chief Complaint:  fever  Admission diagnosis: neutropenic fever    HPI: Gina Barrett is a 65 y.o., White female who presented to the Emergency Department complaining of a fever. The patient's husband states that the patient currently has metastatic lung cancer among other conditions, and she states that she has been treated with chemotherapy for this condition. She states that her last treatment was on 02/28/21, and the husband states that the patient had an injection for blood counts this past weekend. He states that she also had an infusion at the [facility] earlier in the day today. He states that throughout the day today the patient became increasingly fatigued compared to baseline, and he notes that the patient measured a fever of around 103.4 F at home prior to arrival to the ED. He states that the patient has continued to measure a fever and complain of worsening fatigue, and she denies any alleviation prior to arrival to the ED. She denies symptoms of nausea, vomiting, diarrhea, syncope, fever, headaches, chest pain, abdominal pain, and any urinary or bowel issues.     Co-morbidities: cancer (non-small cell lung), hypothyroidism, Raynaud's disease  Smoker: no  Alcohol use: no  Illicit drug use: no        ROS: Other than ROS in the HPI, all other systems were negative.    There are no active hospital problems to display for this patient.      Past Medical History:   Diagnosis Date   . Abnormal Pap smear     ASCUS, ASCUS cannot rule out HGSIL   . Cancer (CMS HCC)     non  small cell lung cancer (R)-s/p Chemo/Radiation completed 8/18   . Colon polyp    . Dysphagia    . Hypothyroid    . Non-small cell lung cancer, right (CMS [place])     s/p Chemo completed 9/18   . Raynaud's disease    . STD (sexually transmitted disease)     HSV   . Unspecified disorder of thyroid     Hypothyroidism           Past Surgical History:   Procedure Laterality Date   . HX CATARACT REMOVAL Bilateral 2011   . HX COLONOSCOPY  2009   . HX ELBOW SURGERY     . HX FOOT SURGERY     . HX TUBAL LIGATION  1978   . LUNG BIOPSY Right 10/02/2016   . MEDIASTINOSCOPY  11/13/2016   . PB FOREARM/WRIST SURGERY UNLISTED  1999    carpal tunnel release   . PORTACATH PLACEMENT  12/12/2016           Medications Prior to Admission     Prescriptions    Artifi. Tear, Hypromellose,-PF (RETAINE [facility], PF,) 0.3 % Ophthalmic Drops    Administer into affected eye(s) Once per day as needed    Calcium Carbonate 500 mg calcium (1,250 mg) Oral Tablet, Chewable    Chew 1  Tablet (500 mg total) Once a day    carboxymethyl/glycerin/poly80 (REFRESH OPTIVE ADVANCED OPHT)    Apply to eye Once per day as needed    carboxymethylcellulose Sodium (THERATEARS) 0.25 % Ophthalmic Drops    1-2 Drops Once per day as needed    folic acid (FOLVITE) 1 mg Oral Tablet    TAKE ONE TABLET BY MOUTH EVERY DAY    levothyroxine (SYNTHROID) 88 mcg Oral Tablet    TAKE ONE TABLET BY MOUTH EVERY DAY    loratadine (CLARITIN) 10 mg Oral Tablet    take 10 mg by mouth Once a day.    MEDIHONEY, HONEY, 80 % Gel    Twice daily to affected area(s)    megestroL (MEGACE) 40 mg Oral Tablet    Take 40 mg by mouth    mirtazapine (REMERON) 15 mg Oral Tablet    TAKE ONE TABLET BY MOUTH EVERY EVENING    MULTIVITAMIN ORAL    Take by mouth Once a day Centrum Silver Women's 50+    nebivoloL (BYSTOLIC) 5 mg Oral Tablet    Nifedipine (ADALAT CC) 30 mg Oral Tablet Sustained Release    take 30 mg by mouth Once a day.    ondansetron (ZOFRAN ODT) 8 mg Oral Tablet, Rapid Dissolve    Place 1 Tablet (8  mg total) under the tongue Every 8 hours as needed for Nausea/Vomiting Indications: nausea and vomiting caused by cancer drugs    oxyCODONE-acetaminophen (PERCOCET) 10-325 mg Oral Tablet    Take 1 Tablet by mouth Every 6 hours as needed for Pain for up to 30 days    polyethylene glycol (MIRALAX) 17 gram Oral Powder in Packet    Take 1 Packet (17 g total) by mouth Once a day    prochlorperazine (COMPAZINE) 10 mg Oral Tablet    Take 1 Tablet (10 mg total) by mouth Four times a day as needed for Nausea/Vomiting    SENNA PLUS 8.6-50 mg Oral Tablet    TAKE ONE TABLET BY MOUTH EVERY EVENING    tiotropium-olodateroL (STIOLTO RESPIMAT) 2.5-2.5 mcg/actuation Inhalation Mist    Take 2 INHALATION by inhalation Once a day    UNKNOWN MEDICATION (UNKNOWN MEDICATION)    Administer into affected eye(s) [name] 128 Eye Drops OTC 4x daily  [name] 128 Eye Gel OTC Nightly        amikacin (AMIKIN) 700 mg in NS 250 mL IVPB, 15 mg/kg (Ideal), Intravenous, Now        Allergies   Allergen Reactions   . Penicillins        Social History     Tobacco Use   . Smoking status: Former Smoker     Years: 20.00     Types: Cigarettes     Start date: 12/09/1970     Quit date: 09/08/1998     Years since quitting: 22.5   . Smokeless tobacco: Never Used   Substance Use Topics   . Alcohol use: No       Family Medical History:     Problem Relation (Age of Onset)    Breast Cancer Mother    Diabetes Paternal Grandfather, Brother    No Known Problems Father, Sister, Maternal Grandmother, Maternal Grandfather, Paternal 35, Daughter, Son, Maternal Aunt, Maternal Uncle, Paternal 37, Paternal Uncle, Other      The patient denies any other known family history of cardiac, pulmonary, renal, intestinal, or blood disorders, including malignancy. No known family history of psychiatric disorders.  DNR Status:  No Order    EXAM:  Temperature: 38 C (100.4 F)  Heart Rate: (!) 108  BP (Non-Invasive): (!) 140/66  Respiratory Rate: (!) 21  SpO2: 97 %  General:  appears much older than stated age and chronically ill. Acutely ill but in no acute distress.   Eyes: Pupils equal and round, reactive to light    HEENT: Head atraumatic and normocephalic   Neck: No JVD or thyromegaly or lymphadenopathy   Lungs: Clear to auscultation bilaterally.   Cardiovascular: regular rate and rhythm, S1, S2 normal, no murmur  Abdomen: Soft, non-tender, bowel sounds normal   Extremities: extremities normal, atraumatic, no cyanosis or edema   Skin: Skin warm and dry   Neurologic: Grossly normal   Lymphatics: No lymphadenopathy   Psychiatric: Normal affect, behavior         Labs:    I have reviewed all lab results.  Lab Results for Last 24 Hours:    Results for orders placed or performed during the hospital encounter of 03/05/21 (from the past 24 hour(s))   COMPREHENSIVE METABOLIC PANEL, NON-FASTING   Result Value Ref Range    SODIUM 136 136 - 145 mmol/L    POTASSIUM 5.2 (H) 3.5 - 5.1 mmol/L    CHLORIDE 107 96 - 111 mmol/L    CO2 TOTAL 21 (L) 23 - 31 mmol/L    ANION GAP 8 4 - 13 mmol/L    BUN 28 (H) 8 - 25 mg/dL    CREATININE 1.24 (H) 0.60 - 1.05 mg/dL    BUN/CREA RATIO 23 (H) 6 - 22    ESTIMATED GFR 48 (L) >=60 mL/min/BSA    ALBUMIN 3.5 3.4 - 4.8 g/dL     CALCIUM 8.9 8.8 - 10.2 mg/dL    GLUCOSE 92 65 - 125 mg/dL    ALKALINE PHOSPHATASE 82 55 - 145 U/L    ALT (SGPT) 33 (H) 8 - 22 U/L    AST (SGOT)  40 8 - 45 U/L    BILIRUBIN TOTAL 0.4 0.3 - 1.3 mg/dL    PROTEIN TOTAL 7.5 6.0 - 8.0 g/dL   LACTIC ACID LEVEL W/ REFLEX FOR LEVEL >2.0   Result Value Ref Range    LACTIC ACID 1.5 0.5 - 2.2 mmol/L   PROCALCITONIN, SERUM   Result Value Ref Range    PROCALCITONIN 0.12 <0.50 ng/mL   PT/INR - STAT - NOW   Result Value Ref Range    PROTHROMBIN TIME 12.9 (H) 9.4 - 12.5 seconds    INR 1.16    PTT (PARTIAL THROMBOPLASTIN TIME) - STAT - NOW   Result Value Ref Range    APTT 25.0 24.0 - 36.5 seconds   TROPONIN-I - STAT - NOW   Result Value Ref Range    TROPONIN I <7 (L) 7 - 30 ng/L   COVID-19 Screening - COVID Only    Result Value Ref Range    SARS-CoV-2 Not Detected Not Detected   CBC WITH DIFF   Result Value Ref Range    WBC 0.4 (L) 3.7 - 11.0 x10^3/uL    RBC 2.56 (L) 3.85 - 5.22 x10^6/uL    HGB 8.4 (L) 11.5 - 16.0 g/dL    HCT 26.7 (L) 34.8 - 46.0 %    MCV 104.3 (H) 78.0 - 100.0 fL    MCH 32.8 (H) 26.0 - 32.0 pg    MCHC 31.5 31.0 - 35.5 g/dL    RDW-CV 15.6 (H) 11.5 - 15.5 %  PLATELETS 143 (L) 150 - 400 x10^3/uL    MPV 10.3 8.7 - 12.5 fL       Imaging Studies:    Interpreted by radiologist and individually reviewed by me:    See Epic      Assessment/ Plan:     Neutropenic fever  -continue IV antibiotics with Amikacin  -treat fever  -neutropenic precautions  -continue to follow sepsis protocol  -consult oncology        Tachycardia history - continue Bystolic       Overall PLAN  As above  IV fluids  IV antibiotics  Consult oncology      EDUCATION:  Discussion with the patient/caregiver was done concerning definition, symptoms, testing, treatment and lifestyle modifications for the following diseases/disorders:  -neutropenic fever    Disposition Home when medically optimized; pending test results, need for further testing, or further complications.        DVT prophylaxis: Lovenox  GI prophylaxis: none indicated  Code status: Full code as discussed with patient, husband, and daughter      I spent a total of 38 minutes in direct/indirect care of this patient including initial evaluation, review of laboratory, radiology, diagnostic studies, review of medical record, order entry and coordination of care.      Jobe Igo, FNP-C

## 2021-03-06 NOTE — ED Nurses Note (Signed)
Pt incontinent in brief of urine. Changed and perineal care provided. Clean brief applied. No other needs voiced at this time.

## 2021-03-06 NOTE — Consults (Signed)
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  Patient was in the [facility] yesterday for Zometa  -she also had symptoms of worsening fatigue.  Denies nose congestion, sore throat, cough, nausea, vomiting, chest pain, diarrhea, dysuria   -in the ED vital signs showed blood pressure 168/69, heart rate 114, respiratory rate 20, temperature 102.8   -labs showed WBC 0.4, hemoglobin 8.4, platelet count 143, creatinine 1.24, AST 40, ALT 33, alkaline phosphatase 82, procalcitonin 0.12, lactic acid 1.5  -admitted to hospital for management of febrile neutropenia associated with sepsis  -currently on antibiotics with cefepime, patient feels much better at the bedside today.     ________________________________________________________________________________________________________________________________________________     Hem/Onc Diagnosis: Non-small cell mod differentiated adenocarcinoma RUL of the lung diagnosed at [facility] on 10/02/2016  -underwent CT lung cancer screening on 05/10/2016 which showed 8 mm ill-defined nodule in the right upper lobe, a 5 mm nodule at the base of the right lower lobe with subpleural scarring in both lungs.  -PET-CT scan on 09/06/2016 showed 10 mm right upper lobe FDG avid lesion that is slightly larger than 2017 CT and concerning for malignancy.  A separate 3 cm  long area of tracer uptake in the left lower lobe was noted.  -CT-guided biopsy a [facility] on 10/02/2016 was positive for adenocarcinoma that is well to moderately differentiated.  -Mediastinoscopy 11/13/2016 positive for left mid paratrachael    Stage: IV, initially diagnosed as IIIB     Molecular/special studies:  ROS-1, ALK-1, EGFR, PDL1 all negative    Treatment & Surveillance:   1. S/p chemoradiation with carboplatin + paclitaxel + RT 66.6 Gy completed 02/10/19/18    2. Serial imaging since completion of therapy has been negative for recurrence.  Most recent imaging in on 12/03/2018 was also negative but showed a new T6 compression abnormality.     3. Chest CT with contrast from 09/23/2019 which in summary showed increasing pleural based density in the LLL which may be infectious or inflammatory.  There is increased 1.7cm LN in left hilum ans stable compression abnormality of T6.    4. Biopsy of left LL pleural bases mass on 11/02/2019 showed benign fibrovascular tissue with focal chronic inflammation.  no lung parechyma or malgnancy identified.    5. Patient developed progressive musculoskeletal pain around October /November of 2021 evaluation with multiple imaging studies highly suggest metastatic disease.  Evaluation for plasma cell was noncontributory.  Patient was referred to orthopedic surgery ([facility], by family request and known to patient)  for surgical recommendation especially regarding weight-bearing.    6. Biopsy of paraspinal soft tissue mass on 07/04/2020 confirmed metastatic carcinoma consistent with lung primary. CARIS NGS negative    7.Radiation Oncology for radiation to the acetabulum as well as the paraspinal mass started 07/19/2020 x 10 , ending 07/31/2020    8. Mediport placed  07/19/2020  9.Begin Zoledronic acid.  Has dentures and no dental eval needed    10. Brain MRI planne2/12/2020     11.  Begin palliative carboplatin & pemetrexed on 08/03/2020        ________________________________________________________________________________________________________________________________________________     Objective   Past Medical History:   Diagnosis Date   . Abnormal Pap smear     ASCUS, ASCUS cannot rule out HGSIL   . Cancer (CMS HCC)     non small cell lung cancer (R)-s/p Chemo/Radiation completed 8/18   . Colon polyp    . Dysphagia    . Hypothyroid    . Non-small cell lung cancer, right (CMS [place])     s/p Chemo completed 9/18   . Raynaud's disease    . STD (sexually transmitted disease)     HSV   . Unspecified disorder of thyroid     Hypothyroidism         Past Surgical History:   Procedure Laterality Date   . HX CATARACT REMOVAL Bilateral 2011   . HX COLONOSCOPY  2009   . HX ELBOW SURGERY     . HX FOOT SURGERY     . HX TUBAL LIGATION  1978   . LUNG BIOPSY Right 10/02/2016   . MEDIASTINOSCOPY  11/13/2016   . PB FOREARM/WRIST SURGERY UNLISTED  1999    carpal tunnel release   . PORTACATH PLACEMENT  12/12/2016         Medications Prior to Admission     Prescriptions    clobetasoL-emollient no.65 0.05 % Apply externally Combo Pack    Apply topically Twice daily To buttock    clotrimazole (LOTRIMIN) 1 % Cream    Apply topically    Desoximetasone (TOPICORT) 0.25 % Cream    Apply topically Twice daily To Ear    folic acid (FOLVITE) 1 mg Oral Tablet    TAKE ONE TABLET BY MOUTH EVERY DAY    Patient not taking:  No sig reported    levothyroxine (SYNTHROID) 88 mcg Oral Tablet    TAKE ONE TABLET BY MOUTH EVERY DAY    loratadine (CLARITIN) 10 mg Oral Tablet    take 10 mg by mouth Once a day.    [facility], HONEY, 80 % Gel    Twice daily to affected area(s)    Patient not taking:  No sig reported    megestroL (MEGACE) 40 mg Oral Tablet    Take 40 mg by mouth    mirtazapine (REMERON) 15 mg Oral Tablet    TAKE ONE TABLET BY MOUTH EVERY EVENING    MULTIVITAMIN ORAL    Take by mouth Once a day Centrum Silver Women's 50+    nebivoloL (BYSTOLIC) 5 mg Oral Tablet    Patient not  taking:  No sig reported    NIFEdipine (PROCARDIA XL) 30 mg Oral Tablet Extended Rel 24 hr    Take 30 mg by mouth Once a day    oxyCODONE-acetaminophen (PERCOCET) 10-325 mg Oral Tablet    Take 1 Tablet by mouth Every 6 hours as needed for Pain for up to 30 days    SENNA PLUS 8.6-50 mg Oral Tablet    TAKE ONE TABLET BY MOUTH EVERY EVENING    UNKNOWN MEDICATION (UNKNOWN MEDICATION)    Administer into affected eye(s) [name] 128 Eye Drops OTC 4x daily  [name] 128 Eye Gel OTC Nightly        acetaminophen (TYLENOL) tablet, 1,000 mg, Oral, Q6H PRN  arformoterol (BROVANA) 15 mcg/2 mL  nebulizer solution, 15 mcg, Nebulization, 2x/day  artificial tears ophthalmic solution, 1 Drop, Both Eyes, Q1H PRN  budesonide (PULMICORT RESPULES) 0.5 mg/2 mL nebulizer suspension, 1 mg, Nebulization, 2x/day  calcium carbonate (TUMS) 551m (2069melemental calcium) chewable tablet, 500 mg, Oral, Daily  cefepime (MAXIPIME) 2 g in NS 100 mL IVPB minibag, 2 g, Intravenous, Q12H  enoxaparin (LOVENOX) 40 mg/0.4 mL SubQ injection, 40 mg, Subcutaneous, Daily  filgrastim (NEUPOGEN) SubQ injection solution 300 mcg, 300 mcg, Subcutaneous, Daily  folic acid (FOLVITE) tablet, 1 mg, Oral, Daily  ipratropium-albuterol 0.5 mg-3 mg(2.5 mg base)/3 mL Solution for Nebulization, 3 mL, Nebulization, Q4H  levothyroxine (SYNTHROID) tablet, 88 mcg, Oral, Daily  loratadine (CLARITIN) tablet, 10 mg, Oral, Daily  magnesium hydroxide (MILK OF MAGNESIA) 40017mer 5mL82mal liquid, 30 mL, Oral, HS PRN  megestrol (MEGACE) 40mg33m mL oral liquid, 40 mg, Oral, 2x/day  mirtazapine (REMERON) tablet, 15 mg, Oral, NIGHTLY  nebivolol (BYSTOLIC) tablet, 5 mg, Oral, Daily  [Held by provider] NIFEdipine (PROCARDIA XL) 24 hr extended release tablet, 30 mg, Oral, Daily  nitroGLYCERIN (NITROSTAT) sublingual tablet, 0.4 mg, Sublingual, Q5 Min PRN  NS 250 mL flush bag, , Intravenous, Q1H PRN  NS flush syringe, 10 mL, Intravenous, Q8HRS  NS flush syringe, 10 mL, Intravenous, Q1H  PRN  oxyCODONE-acetaminophen 10-325mg 43mtablet, 1 Tablet, Oral, Q6H PRN  polyethylene glycol (MIRALAX) oral packet, 17 g, Oral, Daily  sennosides-docusate sodium (SENOKOT-S) 8.6-50mg per tablet, 1 Tablet, Oral, NIGHTLY      Allergies   Allergen Reactions   . Penicillins        Family History: Reviewed, discussed  Family Medical History:     Problem Relation (Age of Onset)    Breast Cancer Mother    Diabetes Paternal Grandfather, Brother    No Known Problems Father, Sister, Maternal Grandmother, Maternal Grandfather, Paternal Grandm36hter, Son, Maternal Aunt, Maternal Uncle, Paternal Aunt, 80rnal Uncle, Other          Social History  Social History     Socioeconomic History   . Marital status: Married     Spouse name: Not on file   . Number of children: Not on file   . Years of education: Not on file   . Highest education level: Not on file   Occupational History     Employer: [employer]   . Smoking status: Former Smoker     Years: 20.00     Types: Cigarettes     Start date: 12/09/1970     Quit date: 09/08/1998     Years since quitting: 22.5   . Smokeless tobacco: Never Used   Substance and Sexual Activity   . Alcohol use: No   . Drug use: No   . Sexual activity: Yes     Partners: Male     Birth control/protection: None   Other Topics Concern   . Abuse/Domestic Violence Not Asked   . Breast Self Exam Yes   . Caffeine Concern Not Asked   . Calcium intake adequate No   . Computer Use Not Asked   . Drives Not Asked   . Exercise Concern Not Asked   . Helmet Use Not Asked   . Seat Belt Not Asked   . Special Diet Not Asked   . Sunscreen used Not Asked   . Uses Cane Not Asked   . Uses walker Not Asked   . Uses wheelchair Not Asked   . Right hand dominant  Not Asked   . Left hand dominant Not Asked   . Ambidextrous Not Asked   . Shift Work Not Asked   . Unusual Sleep-Wake Schedule Not Asked   . Ability to Walk 1 Flight of Steps without SOB/CP Yes   . Routine Exercise Not Asked   . Ability to  Walk 2 Flight of Steps without SOB/CP Not Asked   . Unable to Ambulate Not Asked   . Total Care Not Asked   . Ability To Do Own ADL's Yes   . Uses Walker Not Asked   . Other Activity Level Not Asked   . Uses Cane Not Asked   Social History Narrative   . Not on file     Social Determinants of Health     Financial Resource Strain: Not on file   Food Insecurity: Not on file   Transportation Needs: Not on file   Physical Activity: Not on file   Stress: Not on file   Intimate Partner Violence: Not on file   Housing Stability: Not on file       REVIEW OF SYSTEMS  Review of Systems   Constitutional: Positive for fatigue and fever.   Respiratory: Negative for cough and shortness of breath.    Cardiovascular: Negative for chest pain.   Gastrointestinal: Negative for abdominal pain, diarrhea, nausea and vomiting.   Genitourinary: Negative for dysuria and hematuria.    Musculoskeletal: Negative for arthralgias.   Skin: Negative for rash.        EXAM  VITALS:    Temperature: (!) 38.2 C (100.7 F)  Heart Rate: 95  BP (Non-Invasive): 113/61  Respiratory Rate: 19  SpO2: 99 %  Physical Exam  Constitutional:       General: She is not in acute distress.     Appearance: She is ill-appearing (chronic).   HENT:      Head: Normocephalic.      Mouth/Throat:      Pharynx: No oropharyngeal exudate.   Eyes:      Conjunctiva/sclera: Conjunctivae normal.   Cardiovascular:      Rate and Rhythm: Normal rate and regular rhythm.   Pulmonary:      Effort: No respiratory distress.      Breath sounds: Normal breath sounds. No wheezing.   Abdominal:      General: There is no distension.      Palpations: Abdomen is soft.      Tenderness: There is no abdominal tenderness.   Musculoskeletal:      Cervical back: Neck supple.      Right lower leg: No edema.      Left lower leg: No edema.   Lymphadenopathy:      Cervical: No cervical adenopathy.   Skin:     General: Skin is warm and dry.      Coloration: Skin is not jaundiced.   Neurological:      Mental  Status: She is alert and oriented to person, place, and time.          IMAGES:  reviewed by radiology and independently by me, agree with radiologic description  -reviewed CXR    LABS:    I have reviewed all lab results.  Lab Results for Last 24 Hours:    Results for orders placed or performed during the hospital encounter of 03/05/21 (from the past 24 hour(s))   ECG 12-LEAD   Result Value Ref Range    Ventricular rate 118 BPM    Atrial Rate  118 BPM    PR Interval 170 ms    QRS Duration 64 ms    QT Interval 308 ms    QTC Calculation 431 ms    Calculated P Axis 82 degrees    Calculated R Axis -29 degrees    Calculated T Axis 74 degrees   COMPREHENSIVE METABOLIC PANEL, NON-FASTING   Result Value Ref Range    SODIUM 136 136 - 145 mmol/L    POTASSIUM 5.2 (H) 3.5 - 5.1 mmol/L    CHLORIDE 107 96 - 111 mmol/L    CO2 TOTAL 21 (L) 23 - 31 mmol/L    ANION GAP 8 4 - 13 mmol/L    BUN 28 (H) 8 - 25 mg/dL    CREATININE 1.24 (H) 0.60 - 1.05 mg/dL    BUN/CREA RATIO 23 (H) 6 - 22    ESTIMATED GFR 48 (L) >=60 mL/min/BSA    ALBUMIN 3.5 3.4 - 4.8 g/dL     CALCIUM 8.9 8.8 - 10.2 mg/dL    GLUCOSE 92 65 - 125 mg/dL    ALKALINE PHOSPHATASE 82 55 - 145 U/L    ALT (SGPT) 33 (H) 8 - 22 U/L    AST (SGOT)  40 8 - 45 U/L    BILIRUBIN TOTAL 0.4 0.3 - 1.3 mg/dL    PROTEIN TOTAL 7.5 6.0 - 8.0 g/dL   LACTIC ACID LEVEL W/ REFLEX FOR LEVEL >2.0   Result Value Ref Range    LACTIC ACID 1.5 0.5 - 2.2 mmol/L   PROCALCITONIN, SERUM   Result Value Ref Range    PROCALCITONIN 0.12 <0.50 ng/mL   PT/INR - STAT - NOW   Result Value Ref Range    PROTHROMBIN TIME 12.9 (H) 9.4 - 12.5 seconds    INR 1.16    PTT (PARTIAL THROMBOPLASTIN TIME) - STAT - NOW   Result Value Ref Range    APTT 25.0 24.0 - 36.5 seconds   TROPONIN-I - STAT - NOW   Result Value Ref Range    TROPONIN I <7 (L) 7 - 30 ng/L   COVID-19 Screening - COVID Only   Result Value Ref Range    SARS-CoV-2 Not Detected Not Detected   CBC WITH DIFF   Result Value Ref Range    WBC 0.4 (L) 3.7 - 11.0 x10^3/uL     RBC 2.56 (L) 3.85 - 5.22 x10^6/uL    HGB 8.4 (L) 11.5 - 16.0 g/dL    HCT 26.7 (L) 34.8 - 46.0 %    MCV 104.3 (H) 78.0 - 100.0 fL    MCH 32.8 (H) 26.0 - 32.0 pg    MCHC 31.5 31.0 - 35.5 g/dL    RDW-CV 15.6 (H) 11.5 - 15.5 %    PLATELETS 143 (L) 150 - 400 x10^3/uL    MPV 10.3 8.7 - 12.5 fL   URINALYSIS WITH MICROSCOPIC REFLEX IF INDICATED [facility] ONLY   Result Value Ref Range    COLOR Light Yellow Light Yellow, Straw, Yellow    APPEARANCE Clear Clear    PH 7.0 <8.0    LEUKOCYTES Negative Negative WBCs/uL    NITRITE Negative Negative    PROTEIN Trace (A) Negative, 10  mg/dL    GLUCOSE Negative Negative mg/dL    KETONES Negative Negative mg/dL    UROBILINOGEN 0.2  <=2.0 mg/dL    BILIRUBIN Negative Negative mg/dL    BLOOD Trace (A) Negative mg/dL    SPECIFIC GRAVITY 1.010 <1.022   URINALYSIS, MICROSCOPIC   Result Value  Ref Range    RBCS 0-2 0 - 2 /hpf    WBCS 0-2 0 - 2 /hpf    BACTERIA Slight (A) None /hpf    SQUAMOUS EPITHELIAL None 0 - 2 /hpf    MUCOUS Slight (A) None, Light /hpf   BASIC METABOLIC PANEL   Result Value Ref Range    SODIUM 138 136 - 145 mmol/L    POTASSIUM 4.2 3.5 - 5.1 mmol/L    CHLORIDE 113 (H) 96 - 111 mmol/L    CO2 TOTAL 16 (L) 23 - 31 mmol/L    ANION GAP 9 4 - 13 mmol/L    CALCIUM 7.8 (L) 8.8 - 10.2 mg/dL    GLUCOSE 98 65 - 125 mg/dL    BUN 23 8 - 25 mg/dL    CREATININE 0.96 0.60 - 1.05 mg/dL    BUN/CREA RATIO 24 (H) 6 - 22    ESTIMATED GFR 65 >=60 mL/min/BSA   CBC WITH DIFF   Result Value Ref Range    WBC 0.5 (L) 3.7 - 11.0 x10^3/uL    RBC 2.49 (L) 3.85 - 5.22 x10^6/uL    HGB 8.2 (L) 11.5 - 16.0 g/dL    HCT 27.4 (L) 34.8 - 46.0 %    MCV 110.0 (H) 78.0 - 100.0 fL    MCH 32.9 (H) 26.0 - 32.0 pg    MCHC 29.9 (L) 31.0 - 35.5 g/dL    RDW-CV 15.6 (H) 11.5 - 15.5 %    PLATELETS 99 (L) 150 - 400 x10^3/uL    MPV 9.5 8.7 - 12.5 fL    NEUTROPHIL % 48 %    LYMPHOCYTE % 20 %    MONOCYTE % 28 %    EOSINOPHIL % 0 %    BASOPHIL % 0 %    NEUTROPHIL # 0.22 (L) 1.50 - 7.70 x10^3/uL    LYMPHOCYTE # <0.10 (L) 1.00 -  4.80 x10^3/uL    MONOCYTE # 0.13 (L) 0.20 - 1.10 x10^3/uL    EOSINOPHIL # <0.10 <=0.50 x10^3/uL    BASOPHIL # <0.10 <=0.20 x10^3/uL    IMMATURE GRANULOCYTE % 4 (H) 0 - 1 %    IMMATURE GRANULOCYTE # <0.10 <0.10 x10^3/uL             ________________________________________________________________________________________________________________________________________________     Impression:  [facility] is a 65 y.o. female with metastatic non-small cell lung cancer (adenocarcinoma), currently on treatment with carboplatin/pemetrexed last received on 09/15 presented with febrile illness associated with neutropenia, meeting criteria for sepsis.  UA and chest x-ray negative, COVID negative.  Blood cultures pending   -follows with Dr. Holley Dexter in the outpatient setting  -currently on antibiotics with cefepime    Heme/Onc Diagnosis:   -metastatic non-small cell lung cancer  -bone metastasis   -pancytopenia related to chemotherapy   -AKI    Recommendations:   -agree with continuing antibiotics, follow-up blood cultures .  No definitive source of infection identified  -neutropenia related to myelosuppression from chemotherapy, will start Neupogen to increase ANC >1000  -support with transfusion as necessary to keep hemoglobin above 7  -mild to moderate thrombocytopenia, continue to monitor   -AKI resolved   -if remains afebrile >24 hrs, can deescalate to p.o. antibiotics with Gram-negative coverage  -will discuss with Dr. Holley Dexter about started Neulasta with subsequent cycles of chemotherapy as secondary prevention of neutropenic fever      Will continue to follow, please call with any questions    Mirna Mires, MD

## 2021-03-06 NOTE — Ancillary Notes (Signed)
[facility]  Medical Nutrition Therapy Screen Note                                      Date of Service: 03/06/2021    Reason for Note: Nursing Nutritional Risk notification:  BMI < 20 for an Adult patient    Current Diet Order/Nutrition Support:  DIET CARDIAC    Reviewed patient status, diet order/TF/TPN, labs and medications.  Height: 154.9 cm (5\' 1" )   Weight: 48.7 kg (107 lb 5.8 oz) (03/06/21 0337)   Body mass index is 20.29 kg/m.    Brief Subjective:   Pt is a 65 yo F with hx of metastatic lung disease. Admitted for sepsis; neutropenic fevers. Started on IV antibiotics.  Diet is Cardiac; no intakes yet available. Pt historically has a small appetite and chronically low weight.   Currently, pt is only 88.1% of IBW - however, weight has been trending upwards over the last 9 months (10.7% gain over the last 2 months).  Braden Score = 17; nutrition = 2; probably inadequate. Last BM 9/20.   Skin - intact.  Nutrition Related Labs: Hgb 8.2 (L)  Nutrition Related Meds: Tums, cefepime, folic acid, remeron, NS at 125 ml/hr, Miralax, senokot     Most Recent Screen Previous screen   Impaired Nutrition Status Score Impaired Nutrition Status Score: 2 - BMI 18.5 - 20.5 + impaired general condition - Moderate (03/06/21 0900)   Impaired Nutrition Status Score: 2 - BMI 18.5 - 20.5 + impaired general condition - Moderate (03/06/21 0900)           Severity of Disease Score Severity of Disease Score: 1 - Chronic patients in particular with acute complications: cirrhosis, COPD, dialysis, oncology, recent bariatric surgery - Mild (03/06/21 0900)   Severity of Disease Score: 1 - Chronic patients in particular with acute complications: cirrhosis, COPD, dialysis, oncology, recent bariatric surgery - Mild (03/06/21 0900)       Age Age: 53 - < 70 years (03/06/21 0900) Age: 53 - < 70 years (03/06/21 0900)     Score Score: 3 (03/06/21 0900)     Score: 3 (03/06/21 0900)   Risk Level Risk Level:  Level 3 - 3 pts - Screen note required (03/06/21 0900)   Risk Level: Level 3 - 3 pts - Screen note required (03/06/21 0900)           Nutrition related problems: Underweight-for-age r/t hx of catabolic illness and lifestyle aeb BMI of 20.29, chronically low weight status with historically poor appetite.     Assessment: Needs assessed/reassessed in 2-5 days    Monitor: Po status, Tolerance of diet and intakes, weight status.    Plan/Intervention: Continue with same regimen  Will continue to monitor per screening protocol     - Will order Ensure Plus High Protein BID.  - Given pt's hx of eating little, would suggest to liberalize diet to Regular.    Moise Boring, [place]

## 2021-03-06 NOTE — Care Management Notes (Signed)
Received a copy of the MPOA from nursing staff.  This form is already in epic.  MPOA is listed as spouse Riki Sheer (Primary), and D.J. Kimball (secondary).

## 2021-03-06 NOTE — ACP (Advance Care Planning) (Signed)
Goals of Care  ([place]) Documentation    Purpose of Encounter: evaluationdiscussion    Parties in Attendance: Patient and Family (husband and daughter)    Patient's Decisional Capacity (Name of surrogate if no capacity)  possesses medical decision making capacity     HCS: husband, Lorraina Spring    Subjective/Patient Story:  fever    Objective/Medical Story:  Neutropenic fever;  I discussed resuscitation measures to include chest compressions, defibrillation, intubation, and mechanical ventilation. The patient would like all measures necessary to keep her alive if needed.      Goals of Care Determinations: FULL CODE     Plan:  Patient Active Problem List   Diagnosis   . Sjoegren syndrome   . Malignant neoplasm of lung, unspecified laterality, unspecified part of lung (CMS HCC)   . Drug-induced pancytopenia (CMS HCC)   . Other neutropenia (CMS [place])   . Port catheter in place   . Dysphagia   . Non-small cell lung cancer, right (CMS [place])   . 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 Anemia   . Neutropenic fever (CMS HCC)       Code Status:      Other documents completed (e.g. Advance directives, POLST, MOLST): no      Time spent discussing advance care planning (ACP) - required if submitting a charge for an ACP discussion: 16 min

## 2021-03-06 NOTE — Progress Notes (Signed)
[facility]  Internal  Medicine Progress Note      Gina Barrett, Gina Barrett  Date of Admission:  03/05/2021  Date of Birth:  17-Jun-1955  Date of Service:  03/06/2021    Patient was admitted earlier this morning by Dr.Debord with neutropenic fever.  I saw and examined the patient, remains afebrile since admission.  Will continue cefepime and vancomycin, pending cultures.  See today's H&P for details of the management.

## 2021-03-06 NOTE — Pharmacy (Signed)
[facility] Medication Reconciliation  Gina Barrett, Gina Barrett, 65 y.o. female  Date of Birth:  01-26-1956  Date of Admission:  03/05/2021  MRN# [id number]    Allergies   Allergen Reactions   . Penicillins        Active Hospital Problems    Diagnosis   . Primary Problem: Neutropenic fever (CMS HCC)   . Port catheter in place   . Drug-induced pancytopenia (CMS HCC)   . Malignant neoplasm of lung, unspecified laterality, unspecified part of lung (CMS HCC)       Medications Prior to Admission     Prescriptions    clobetasoL-emollient no.65 0.05 % Apply externally Combo Pack    Apply topically Twice daily To buttock    clotrimazole (LOTRIMIN) 1 % Cream    Apply topically    Desoximetasone (TOPICORT) 0.25 % Cream    Apply topically Twice daily To Ear    folic acid (FOLVITE) 1 mg Oral Tablet    TAKE ONE TABLET BY MOUTH EVERY DAY    Patient not taking:  No sig reported    levothyroxine (SYNTHROID) 88 mcg Oral Tablet    TAKE ONE TABLET BY MOUTH EVERY DAY    loratadine (CLARITIN) 10 mg Oral Tablet    take 10 mg by mouth Once a day.    [facility], HONEY, 80 % Gel    Twice daily to affected area(s)    Patient not taking:  No sig reported    megestroL (MEGACE) 40 mg Oral Tablet    Take 40 mg by mouth    mirtazapine (REMERON) 15 mg Oral Tablet    TAKE ONE TABLET BY MOUTH EVERY EVENING    MULTIVITAMIN ORAL    Take by mouth Once a day Centrum Silver Women's 50+    nebivoloL (BYSTOLIC) 5 mg Oral Tablet    Patient not taking:  No sig reported    NIFEdipine (PROCARDIA XL) 30 mg Oral Tablet Extended Rel 24 hr    Take 30 mg by mouth Once a day    oxyCODONE-acetaminophen (PERCOCET) 10-325 mg Oral Tablet    Take 1 Tablet by mouth Every 6 hours as needed for Pain for up to 30 days    SENNA PLUS 8.6-50 mg Oral Tablet    TAKE ONE TABLET BY MOUTH EVERY EVENING    UNKNOWN MEDICATION (UNKNOWN MEDICATION)    Administer into affected eye(s) [name] 128 Eye Drops OTC 4x daily  [name] 128 Eye Gel OTC Nightly          acetaminophen  (TYLENOL) tablet, 1,000 mg, Oral, Q6H PRN  arformoterol (BROVANA) 15 mcg/2 mL nebulizer solution, 15 mcg, Nebulization, 2x/day  artificial tears ophthalmic solution, 1 Drop, Both Eyes, Q1H PRN  budesonide (PULMICORT RESPULES) 0.5 mg/2 mL nebulizer suspension, 1 mg, Nebulization, 2x/day  calcium carbonate (TUMS) 500mg  (200mg  elemental calcium) chewable tablet, 500 mg, Oral, Daily  cefepime (MAXIPIME) 2 g in NS 100 mL IVPB minibag, 2 g, Intravenous, Q12H  enoxaparin (LOVENOX) 40 mg/0.4 mL SubQ injection, 40 mg, Subcutaneous, Daily  folic acid (FOLVITE) tablet, 1 mg, Oral, Daily  ipratropium-albuterol 0.5 mg-3 mg(2.5 mg base)/3 mL Solution for Nebulization, 3 mL, Nebulization, Q4H  levothyroxine (SYNTHROID) tablet, 88 mcg, Oral, Daily  loratadine (CLARITIN) tablet, 10 mg, Oral, Daily  magnesium hydroxide (MILK OF MAGNESIA) 400mg  per 23mL oral liquid, 30 mL, Oral, HS PRN  megestrol (MEGACE) 40mg  per mL oral liquid, 40 mg, Oral, 2x/day  mirtazapine (REMERON) tablet, 15 mg, Oral, NIGHTLY  nebivolol (  BYSTOLIC) tablet, 5 mg, Oral, Daily  [Held by provider] NIFEdipine (PROCARDIA XL) 24 hr extended release tablet, 30 mg, Oral, Daily  nitroGLYCERIN (NITROSTAT) sublingual tablet, 0.4 mg, Sublingual, Q5 Min PRN  NS 250 mL flush bag, , Intravenous, Q1H PRN  NS flush syringe, 10 mL, Intravenous, Q8HRS  NS flush syringe, 10 mL, Intravenous, Q1H PRN  NS premix infusion, , Intravenous, Continuous  oxyCODONE-acetaminophen 10-325mg  per tablet, 1 Tablet, Oral, Q6H PRN  polyethylene glycol (MIRALAX) oral packet, 17 g, Oral, Daily  sennosides-docusate sodium (SENOKOT-S) 8.6-50mg  per tablet, 1 Tablet, Oral, NIGHTLY        The patient's current medication list has been reviewed for accuracy against their home drug regimen.    Comments:    After reviewing the most recent dispense report from the patient's preferred pharmacy, the following discrepancies were noted:    - Clobetasol 0.05% cream - Apply topically to buttock BID   - Clotrimazole  1% cream   - Desoximetasone 0.25% ointment - Apply topically to ear BID   - Nifedipine ER 30 mg PO daily  Patient no longer takes:    - Artificial tears ophthalmic drops   - Calcium carbonate   - Theratears ophthalmic drops   - Refresh ophthalmic drops   - Dexamethasone   - Miralax   - Compazine   - Stiolto Respimat   - Zofran     Abby Potash, PHARMD   03/06/2021, 11:57

## 2021-03-06 NOTE — Nurses Notes (Signed)
Patient arrived to ICU 17, attached to monitor. Admission and assessment in progress.

## 2021-03-06 NOTE — Nurses Notes (Signed)
All documentation reviewed by Verlon Au, RN and agreed with. Patient resting in bed quietly. VSS. Pt's son at bedside. No s/s of distress. Call light within reach.

## 2021-03-06 NOTE — Nurses Notes (Signed)
Consult sent to Dr. Mauri Pole via alpha page.

## 2021-03-06 NOTE — Progress Notes (Signed)
Notified of need for admission. Case discussed with Dr. Trenton Gammon. Admission orders and assessment forthcoming.

## 2021-03-07 ENCOUNTER — Inpatient Hospital Stay (HOSPITAL_COMMUNITY): Payer: Medicare Other

## 2021-03-07 LAB — BASIC METABOLIC PANEL
ANION GAP: 8 mmol/L (ref 4–13)
BUN/CREA RATIO: 20 (ref 6–22)
BUN: 18 mg/dL (ref 8–25)
CALCIUM: 8.3 mg/dL — ABNORMAL LOW (ref 8.8–10.2)
CHLORIDE: 117 mmol/L — ABNORMAL HIGH (ref 96–111)
CO2 TOTAL: 17 mmol/L — ABNORMAL LOW (ref 23–31)
CREATININE: 0.91 mg/dL (ref 0.60–1.05)
ESTIMATED GFR: 70 mL/min/BSA (ref >=60)
GLUCOSE: 92 mg/dL (ref 65–125)
POTASSIUM: 4 mmol/L (ref 3.5–5.1)
SODIUM: 142 mmol/L (ref 136–145)

## 2021-03-07 LAB — MANUAL DIFF AND MORPHOLOGY-SYSMEX
BASOPHIL #: <0.1 10*3/uL (ref <=0.20)
BASOPHIL %: 0 %
EOSINOPHIL #: <0.1 10*3/uL (ref <=0.50)
EOSINOPHIL %: 0 %
LYMPHOCYTE #: 0.18 10*3/uL — ABNORMAL LOW (ref 1.00–4.80)
LYMPHOCYTE %: 16 %
METAMYELOCYTE %: 2 %
MONOCYTE #: 0.21 10*3/uL (ref 0.20–1.10)
MONOCYTE %: 19 %
NEUTROPHIL #: 0.69 10*3/uL — ABNORMAL LOW (ref 1.50–7.70)
NEUTROPHIL %: 53 %
NEUTROPHIL BANDS %: 10 %

## 2021-03-07 LAB — CBC W/AUTO DIFF
HCT: 23.6 % — ABNORMAL LOW (ref 34.8–46.0)
HGB: 7.3 g/dL — ABNORMAL LOW (ref 11.5–16.0)
MCH: 33 pg — ABNORMAL HIGH (ref 26.0–32.0)
MCHC: 30.9 g/dL — ABNORMAL LOW (ref 31.0–35.5)
MCV: 106.8 fL — ABNORMAL HIGH (ref 78.0–100.0)
MPV: 10.1 fL (ref 8.7–12.5)
PLATELETS: 81 10*3/uL — ABNORMAL LOW (ref 150–400)
RBC: 2.21 10*6/uL — ABNORMAL LOW (ref 3.85–5.22)
RDW-CV: 15.6 % — ABNORMAL HIGH (ref 11.5–15.5)
WBC: 1.1 10*3/uL — ABNORMAL LOW (ref 3.7–11.0)

## 2021-03-07 LAB — URINE CULTURE: URINE CULTURE: NO GROWTH

## 2021-03-07 NOTE — Care Plan (Signed)
Patient up in chair and ambulating in room with walk today. VSS. Afebrile. No s/s of distress. No complaints. Call light within reach.

## 2021-03-07 NOTE — Progress Notes (Signed)
[facility]  Oncology Consult  Follow Up Note    Gina, Barrett, 65 y.o. female  Date of Service: 03/07/2021  Date of Birth:  1956-03-22    Hospital Day:  LOS: 1 day     Subjective:   Patient is a pleasant 65 year old female with history of metastatic lung carcinoma, on chemotherapy, who presents with febrile pancytopenia. Patient is accompanied at bedside by her husband. Today, patient notes that she is feeling better and is without symptoms. She has been walking around the room throughout the day, stating that she feels strong enough to go home. She remained afebrile most of the day but most recently at 5 pm was febrile with 101 F. Denies fatigue, chills, muscle aches, headache, decreased appetite, nausea, vomiting, diarrhea, chest pain, shortness of breath. Patient is currently febrile (101 F) as of 5 pm despite being afebrile most of the day. Patient and her husband understand the need for patient to be afebrile for 24 hours prior to attempting to switch from IV to PO antibiotics and discharge her home     ROS:     Review of Systems   Constitutional: Positive for fever. Negative for appetite change and fatigue.   Respiratory: Negative for cough, hemoptysis and shortness of breath.    Cardiovascular: Negative for chest pain and leg swelling.   Gastrointestinal: Negative for abdominal distention, abdominal pain, diarrhea, nausea and vomiting.   Genitourinary: Negative for dysuria and hematuria.    Musculoskeletal: Negative for back pain.   Neurological: Negative for dizziness and headaches.   Hematological: Negative for adenopathy.        Objective     Temperature: 37.8 C (100 F)  Heart Rate: (!) 110  BP (Non-Invasive): (!) 154/71  Respiratory Rate: (!) 25  SpO2: 97 %  Physical Exam  Constitutional:       General: She is not in acute distress.  HENT:      Head: Normocephalic and atraumatic.      Mouth/Throat:      Mouth: Mucous membranes are moist.      Pharynx: Oropharynx is clear. No posterior  oropharyngeal erythema.   Neck:      Vascular: No carotid bruit.   Cardiovascular:      Pulses: Normal pulses.      Heart sounds: Normal heart sounds. No murmur heard.  Pulmonary:      Breath sounds: Normal breath sounds. No wheezing.   Abdominal:      General: Abdomen is flat. Bowel sounds are normal. There is no distension.      Tenderness: There is no abdominal tenderness.   Musculoskeletal:      Cervical back: Normal range of motion and neck supple.      Right lower leg: No edema.      Left lower leg: No edema.   Lymphadenopathy:      Cervical: No cervical adenopathy.   Skin:     General: Skin is warm and dry.      Capillary Refill: Capillary refill takes less than 2 seconds.   Neurological:      General: No focal deficit present.      Mental Status: She is oriented to person, place, and time.      Motor: No weakness.   Psychiatric:         Mood and Affect: Mood normal.         Thought Content: Thought content normal.          Labs:  Lab Results Today:    Results for orders placed or performed during the hospital encounter of 03/05/21 (from the past 24 hour(s))   CBC W/AUTO DIFF   Result Value Ref Range    WBC 1.1 (L) 3.7 - 11.0 x103/uL    RBC 2.21 (L) 3.85 - 5.22 x106/uL    HGB 7.3 (L) 11.5 - 16.0 g/dL    HCT 23.6 (L) 34.8 - 46.0 %    MCV 106.8 (H) 78.0 - 100.0 fL    MCH 33.0 (H) 26.0 - 32.0 pg    MCHC 30.9 (L) 31.0 - 35.5 g/dL    RDW-CV 15.6 (H) 11.5 - 15.5 %    PLATELETS 81 (L) 150 - 400 x103/uL    MPV 10.1 8.7 - 12.5 fL   MANUAL DIFF AND MORPHOLOGY-SYSMEX   Result Value Ref Range    NEUTROPHIL % 53 %    LYMPHOCYTE %  16 %    MONOCYTE % 19 %    EOSINOPHIL % 0 %    BASOPHIL % 0 %    NEUTROPHIL BANDS % 10 %    METAMYELOCYTE %  2 %    NEUTROPHIL # 0.69 (L) 1.50 - 7.70 x103/uL    LYMPHOCYTE # 0.18 (L) 1.00 - 4.80 x103/uL    MONOCYTE # 0.21 0.20 - 1.10 x103/uL    EOSINOPHIL # <0.10 <=0.50 x103/uL    BASOPHIL # <0.10 <=0.20 x103/uL    DOHLE BODIES Present (A) None    TOXIC GRANULATION Present (A) None    BASIC METABOLIC PANEL   Result Value Ref Range    SODIUM 142 136 - 145 mmol/L    POTASSIUM 4.0 3.5 - 5.1 mmol/L    CHLORIDE 117 (H) 96 - 111 mmol/L    CO2 TOTAL 17 (L) 23 - 31 mmol/L    ANION GAP 8 4 - 13 mmol/L    CALCIUM 8.3 (L) 8.8 - 10.2 mg/dL    GLUCOSE 92 65 - 125 mg/dL    BUN 18 8 - 25 mg/dL    CREATININE 0.91 0.60 - 1.05 mg/dL    BUN/CREA RATIO 20 6 - 22    ESTIMATED GFR 70 >=60 mL/min/BSA       Imaging Studies: Imaging pertinent to patient consult was reviewed on Epic.       _______________________________________________________________________________________________________    Assessment: Gina Barrett is a 65 y.o. female with metastatic lung carcinoma here for pancytopenia related to chemotherapy regimen. Admitting CBC was significant for WBC 0.4, Hb 8.4, Plt 143,000. Today's CBC showed WBC 1.1, Hb 7.3, plt 81,000. Following neupogen injection, ANC increased from 0.22 to 0.69. Patient is febrile (101 F) at this time despite remaining afebrile much of the day. Cultures negative to this point, further making an infectious source unlikely. Patient is on IV cefepime at this time. Chemotherapy regimen includes carboplatin/pemetrexed; last completed on 9/15.     Heme/Onc Diagnosis:   Metastatic non-small cell lung cancer  Bone metastasis  Pancytopenia related to chemotherapy    Recommendations:  - Agree with continuing IV cefepime until patient is afebrile for 24 hours; Can then de-escalate to PO ABX once clinically indicated per hospitalist recommendation  - Support with blood transfusion, if indicated, to keep Hb > 7  - Continue Neupogen to maintain absolute neutrophil count; ANC increased from 0.22 --> 0.69 since initiation  - Plan on continuing with carboplatin/premetrexed regimen as scheduled; last completed on 9/15  - Discussed with Dr. Holley Dexter regarding consideration of neupogen for >  3 days after chemo  or even neulasta as an option going forward  - Follow outpatient with Dr. Holley Dexter following  discharge as scheduled    Will sign off, please call with any questions    Mirna Mires, MD

## 2021-03-07 NOTE — Progress Notes (Signed)
[facility]  Internal  Medicine Progress Note      Gina Barrett, Gina Barrett  Date of Admission:  03/05/2021  Date of Birth:  1955/07/14  Date of Service:  03/07/2021    Chief Complaint at Presentation and Brief Summary:  Patient with metastatic lung cancer on chemotherapy presented with fever.  Admitted due to neutropenic fever.    Subjective:  Feeling a lot better than yesterday.  Denies any chest pain, shortness a breath or abdominal pain.  No diarrhea.  No dysuria.      Assessment/ Plan:      Neutropenic fever  Related to chemotherapy  --continue empiric cefepime  --followup culture results, remain negative to this day  --along with Medical Oncology  --started Neupogen     Metastatic lung cancer  --followup with medical oncology as outpatient  --continue Megace, Remeron     Hypertension  --continue Bystolic   --hold nifedipine    Hypothyroidism  --hold Levothyroxine    GERD  --continue Tums p.r.n.      VTE prophylaxis: Lovenox   Disposition: Home in 1-2 days       Vital Signs:  Temp (24hrs) Max:37.9 C (100.2 F)      Temperature: 37.8 C (100 F)  BP (Non-Invasive): 130/74  MAP (Non-Invasive): 90 mmHG  Heart Rate: (!) 114  Respiratory Rate: (!) 25  SpO2: 98 %    Current Medications:  acetaminophen (TYLENOL) tablet, 1,000 mg, Oral, Q6H PRN  arformoterol (BROVANA) 15 mcg/2 mL nebulizer solution, 15 mcg, Nebulization, 2x/day  artificial tears ophthalmic solution, 1 Drop, Both Eyes, Q1H PRN  budesonide (PULMICORT RESPULES) 0.5 mg/2 mL nebulizer suspension, 1 mg, Nebulization, 2x/day  calcium carbonate (TUMS) 500mg  (200mg  elemental calcium) chewable tablet, 500 mg, Oral, Daily  cefepime (MAXIPIME) 2 g in NS 100 mL IVPB minibag, 2 g, Intravenous, Q12H  enoxaparin (LOVENOX) 40 mg/0.4 mL SubQ injection, 40 mg, Subcutaneous, Daily  filgrastim (NEUPOGEN) SubQ injection solution 300 mcg, 300 mcg, Subcutaneous, Daily  folic acid (FOLVITE) tablet, 1 mg, Oral, Daily  levothyroxine (SYNTHROID) tablet, 88 mcg, Oral,  Daily  loratadine (CLARITIN) tablet, 10 mg, Oral, Daily  magnesium hydroxide (MILK OF MAGNESIA) 400mg  per 52mL oral liquid, 30 mL, Oral, HS PRN  megestrol (MEGACE) 40mg  per mL oral liquid, 40 mg, Oral, 2x/day  mirtazapine (REMERON) tablet, 15 mg, Oral, NIGHTLY  nebivolol (BYSTOLIC) tablet, 5 mg, Oral, Daily  [Held by provider] NIFEdipine (PROCARDIA XL) 24 hr extended release tablet, 30 mg, Oral, Daily  nitroGLYCERIN (NITROSTAT) sublingual tablet, 0.4 mg, Sublingual, Q5 Min PRN  NS 250 mL flush bag, , Intravenous, Q1H PRN  NS flush syringe, 10 mL, Intravenous, Q8HRS  NS flush syringe, 10 mL, Intravenous, Q1H PRN  oxyCODONE-acetaminophen 10-325mg  per tablet, 1 Tablet, Oral, Q6H PRN  polyethylene glycol (MIRALAX) oral packet, 17 g, Oral, Daily  sennosides-docusate sodium (SENOKOT-S) 8.6-50mg  per tablet, 1 Tablet, Oral, NIGHTLY        Today's Physical Exam:  General: appears chronically ill. No distress.   Eyes: Pupils equal and round, reactive to light, sclera is anicteric  HEENT: Head atraumatic and normocephalic   Neck: No JVD or thyromegaly   Lungs: Clear to auscultation bilaterally. No wheezing, no crackles  Cardiovascular: S1S2 normal, rhythm is regular, no murmur  Abdomen: Not distended, bowel sounds normoactive, abdomen is Soft, non-tender with palpation  Extremities: extremities normal, atraumatic, no cyanosis or edema   Skin: Skin warm and dry   Neurologic: Grossly normal   Psychiatric: Normal affect, behavior  ROS reviewed and negative except stated in HPI and Subjective      Labs  Please indicate ordered or reviewed)  Reviewed:   I have reviewed all lab results.  Lab Results for Last 24 Hours:    Results for orders placed or performed during the hospital encounter of 03/05/21 (from the past 24 hour(s))   CBC W/AUTO DIFF   Result Value Ref Range    WBC 1.1 (L) 3.7 - 11.0 x10^3/uL    RBC 2.21 (L) 3.85 - 5.22 x10^6/uL    HGB 7.3 (L) 11.5 - 16.0 g/dL    HCT 23.6 (L) 34.8 - 46.0 %    MCV 106.8 (H) 78.0 - 100.0  fL    MCH 33.0 (H) 26.0 - 32.0 pg    MCHC 30.9 (L) 31.0 - 35.5 g/dL    RDW-CV 15.6 (H) 11.5 - 15.5 %    PLATELETS 81 (L) 150 - 400 x10^3/uL    MPV 10.1 8.7 - 12.5 fL   MANUAL DIFF AND MORPHOLOGY-SYSMEX   Result Value Ref Range    NEUTROPHIL % 53 %    LYMPHOCYTE %  16 %    MONOCYTE % 19 %    EOSINOPHIL % 0 %    BASOPHIL % 0 %    NEUTROPHIL BANDS % 10 %    METAMYELOCYTE %  2 %    NEUTROPHIL # 0.69 (L) 1.50 - 7.70 x10^3/uL    LYMPHOCYTE # 0.18 (L) 1.00 - 4.80 x10^3/uL    MONOCYTE # 0.21 0.20 - 1.10 x10^3/uL    EOSINOPHIL # <0.10 <=0.50 x10^3/uL    BASOPHIL # <0.10 <=0.20 x10^3/uL    DOHLE BODIES Present (A) None    TOXIC GRANULATION Present (A) None   BASIC METABOLIC PANEL   Result Value Ref Range    SODIUM 142 136 - 145 mmol/L    POTASSIUM 4.0 3.5 - 5.1 mmol/L    CHLORIDE 117 (H) 96 - 111 mmol/L    CO2 TOTAL 17 (L) 23 - 31 mmol/L    ANION GAP 8 4 - 13 mmol/L    CALCIUM 8.3 (L) 8.8 - 10.2 mg/dL    GLUCOSE 92 65 - 125 mg/dL    BUN 18 8 - 25 mg/dL    CREATININE 0.91 0.60 - 1.05 mg/dL    BUN/CREA RATIO 20 6 - 22    ESTIMATED GFR 70 >=60 mL/min/BSA           Radiology Tests (Please indicate ordered or reviewed)  Reviewed images from this admission:         Problem List:  Active Hospital Problems   (*Primary Problem)    Diagnosis   . *Neutropenic fever (CMS HCC)   . Port catheter in place   . Drug-induced pancytopenia (CMS HCC)   . Malignant neoplasm of lung, unspecified laterality, unspecified part of lung (CMS HCC)         Gina Riggle, MD    Portions of this note may be dictated using voice recognition software or a dictation service. Variances in spelling and vocabulary are possible and unintentional. Not all errors are caught/corrected. Please notify the [facility] if any discrepancies are noted or if the meaning of any statement is not clear.

## 2021-03-08 ENCOUNTER — Inpatient Hospital Stay (HOSPITAL_COMMUNITY): Payer: Medicare Other

## 2021-03-08 ENCOUNTER — Encounter (HOSPITAL_COMMUNITY): Payer: Self-pay | Admitting: Internal Medicine

## 2021-03-08 DIAGNOSIS — R918 Other nonspecific abnormal finding of lung field: Secondary | ICD-10-CM

## 2021-03-08 DIAGNOSIS — R509 Fever, unspecified: Secondary | ICD-10-CM

## 2021-03-08 LAB — RESPIRATORY VIRUS PANEL

## 2021-03-08 LAB — MANUAL DIFF AND MORPHOLOGY-SYSMEX
BASOPHIL #: <0.1 10*3/uL (ref <=0.20)
BASOPHIL %: 2 %
EOSINOPHIL #: <0.1 10*3/uL (ref <=0.50)
EOSINOPHIL %: 1 %
LYMPHOCYTE #: 0.13 10*3/uL — ABNORMAL LOW (ref 1.00–4.80)
LYMPHOCYTE %: 7 %
MONOCYTE #: 0.29 10*3/uL (ref 0.20–1.10)
MONOCYTE %: 16 %
MYELOCYTE %: 3 %
NEUTROPHIL #: 1.28 10*3/uL — ABNORMAL LOW (ref 1.50–7.70)
NEUTROPHIL %: 68 %
NEUTROPHIL BANDS %: 3 %
RBC MORPHOLOGY: NORMAL

## 2021-03-08 LAB — CBC W/AUTO DIFF
HCT: 22.9 % — ABNORMAL LOW (ref 34.8–46.0)
HGB: 7.4 g/dL — ABNORMAL LOW (ref 11.5–16.0)
MCH: 33.2 pg — ABNORMAL HIGH (ref 26.0–32.0)
MCHC: 32.3 g/dL (ref 31.0–35.5)
MCV: 102.7 fL — ABNORMAL HIGH (ref 78.0–100.0)
MPV: 9.7 fL (ref 8.7–12.5)
PLATELETS: 64 10*3/uL — ABNORMAL LOW (ref 150–400)
RBC: 2.23 10*6/uL — ABNORMAL LOW (ref 3.85–5.22)
RDW-CV: 15.3 % (ref 11.5–15.5)
WBC: 1.8 10*3/uL — ABNORMAL LOW (ref 3.7–11.0)

## 2021-03-08 LAB — BASIC METABOLIC PANEL
ANION GAP: 10 mmol/L (ref 4–13)
BUN/CREA RATIO: 18 (ref 6–22)
BUN: 17 mg/dL (ref 8–25)
CALCIUM: 8.7 mg/dL — ABNORMAL LOW (ref 8.8–10.2)
CHLORIDE: 114 mmol/L — ABNORMAL HIGH (ref 96–111)
CO2 TOTAL: 16 mmol/L — ABNORMAL LOW (ref 23–31)
CREATININE: 0.97 mg/dL (ref 0.60–1.05)
ESTIMATED GFR: 64 mL/min/BSA (ref >=60)
GLUCOSE: 86 mg/dL (ref 65–125)
POTASSIUM: 4.1 mmol/L (ref 3.5–5.1)
SODIUM: 140 mmol/L (ref 136–145)

## 2021-03-08 MED ORDER — NEBIVOLOL 5 MG TABLET
5.0000 mg | ORAL_TABLET | Freq: Once | ORAL | Status: AC
Start: 2021-03-08 — End: 2021-03-08
  Administered 2021-03-08: 5 mg via ORAL
  Filled 2021-03-08: qty 1

## 2021-03-08 MED ORDER — NEBIVOLOL 5 MG TABLET
10.0000 mg | ORAL_TABLET | Freq: Every day | ORAL | Status: DC
Start: 2021-03-09 — End: 2021-03-09
  Administered 2021-03-09: 10 mg via ORAL
  Filled 2021-03-08 (×3): qty 2

## 2021-03-08 MED ORDER — CALCIUM 200 MG (AS CALCIUM CARBONATE 500 MG) CHEWABLE TABLET
500.0000 mg | CHEWABLE_TABLET | Freq: Three times a day (TID) | ORAL | Status: DC | PRN
Start: 2021-03-08 — End: 2021-03-09
  Administered 2021-03-08: 500 mg via ORAL
  Filled 2021-03-08: qty 1

## 2021-03-08 NOTE — Nurses Notes (Signed)
Pt taken down via transporter for x-ray. ICU monitor taken with patient.

## 2021-03-08 NOTE — Care Plan (Signed)
Problem: Adult Inpatient Plan of Care  Goal: Plan of Care Review  Outcome: Ongoing (see interventions/notes)  Goal: Patient-Specific Goal (Individualized)  Outcome: Ongoing (see interventions/notes)  Goal: Absence of Hospital-Acquired Illness or Injury  Outcome: Ongoing (see interventions/notes)  Goal: Optimal Comfort and Wellbeing  Outcome: Ongoing (see interventions/notes)  Goal: Rounds/Family Conference  Outcome: Ongoing (see interventions/notes)     Problem: Fever  Goal: Body Temperature in Desired Range  Outcome: Ongoing (see interventions/notes)     Problem: Skin Injury Risk Increased  Goal: Skin Health and Integrity  Outcome: Ongoing (see interventions/notes)     Problem: Fall Injury Risk  Goal: Absence of Fall and Fall-Related Injury  Outcome: Ongoing (see interventions/notes)     Problem: Fever (Fever with Neutropenia)  Goal: Baseline Body Temperature  Outcome: Ongoing (see interventions/notes)     Problem: Infection Risk (Fever with Neutropenia)  Goal: Absence of Infection  Outcome: Ongoing (see interventions/notes)

## 2021-03-08 NOTE — Ancillary Notes (Signed)
[facility]  Medical Nutrition Therapy Screen Note                                      Date of Service: 03/08/2021    Reason for Note: Nutrition Screen Follow-Up    Current Diet Order/Nutrition Support:  DIET CARDIAC    Reviewed patient status, diet order/TF/TPN, labs and medications.  Height: 154.9 cm (5\' 1" )   Weight: 48.7 kg (107 lb 5.8 oz) (03/06/21 0337)   Body mass index is 20.29 kg/m.    Brief Subjective:   Admitted for sepsis; neutropenic fevers from chemotherapy. Hx of metastatic lung disease. Continues on antibiotics.  Diet remains as Cardiac - intakes are poor/low: 0-25%. No reported N/V/D  Braden Score = 17; nutrition = 2; probably inadequate. Last BM 9/23.  Skin - intact.  Nutrition Related Labs: Hgb 7.4 (L)  Nutrition Related Meds: Tums ordered, synthroid, cefepime, remeron, megace, senokot.      Most Recent Screen Previous screen   Impaired Nutrition Status Score Impaired Nutrition Status Score: 2 - BMI 18.5 - 20.5 + impaired general condition - Moderate (03/08/21 0800)   Impaired Nutrition Status Score: 2 - BMI 18.5 - 20.5 + impaired general condition - Moderate (03/06/21 0900)           Severity of Disease Score Severity of Disease Score: 1 - Chronic patients in particular with acute complications: cirrhosis, COPD, dialysis, oncology, recent bariatric surgery - Mild (03/08/21 0800)   Severity of Disease Score: 1 - Chronic patients in particular with acute complications: cirrhosis, COPD, dialysis, oncology, recent bariatric surgery - Mild (03/06/21 0900)       Age Age: 68 - < 70 years (03/08/21 0800) Age: 68 - < 70 years (03/06/21 0900)     Score Score: 3 (03/08/21 0800)     Score: 3 (03/06/21 0900)   Risk Level Risk Level: Level 3 - 3 pts - Screen note required (03/08/21 0800)   Risk Level: Level 3 - 3 pts - Screen note required (03/06/21 0900)           Nutrition related problems: Underweight-for-age with poor/inadequate energy intakes r/t hx of  catabolic illness and lifestyle aeb BMI of 20.29, chronically low weight status with historically poor appetite with 0-25% intakes recorded. - ONGOING    Assessment: Needs assessed/reassessed in 4-5 days    Monitor: Po status, Tolerance of diet and intakes, weight status.    Plan/Intervention: Continue with same regimen  Will continue to monitor per screening protocol     - Will order Ensure Plus High Protein BID.  - Given pt's hx of eating little, would suggest to liberalize diet to Regular.    Moise Boring, [place]

## 2021-03-08 NOTE — Nurses Notes (Signed)
Pt received from ICU. Up in recliner. Pt is standby in the room. IV flushed. No complaints of pain or nausea. Husband in the room. On RA. Tele on.

## 2021-03-08 NOTE — PT Evaluation (Signed)
[place], [place]  [id number]  Rehabilitation Services  Physical Therapy Initial Evaluation        Patient Name: Gina Barrett  Date of Birth: 06/05/1956  Height: 154.9 cm (5\' 1" )  Weight: 48.7 kg (107 lb 5.8 oz)  Room/Bed: 626/A  Payor: MEDICARE / Plan: MEDICARE PART A AND B / Product Type: Medicare /     Encounter Start Date:  03/05/2021  Inpatient Admission Date: 03/06/2021    Patient Active Problem List   Diagnosis   . Sjoegren syndrome   . Malignant neoplasm of lung, unspecified laterality, unspecified part of lung (CMS HCC)   . Drug-induced pancytopenia (CMS HCC)   . Other neutropenia (CMS [place])   . Port catheter in place   . Dysphagia   . Non-small cell lung cancer, right (CMS [place])   . 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 Anemia   . Neutropenic fever (CMS HCC)       Gina Barrett is a 65 y.o., female, admitted to the hospital with neutropenic fever.  Patient has a history of metastatic lung cancer.  There are bony mets    Past Medical Hx:    Past Medical History:   Diagnosis Date   . Abnormal Pap smear     ASCUS, ASCUS cannot rule out HGSIL   . Cancer (CMS HCC)     non small cell lung cancer (R)-s/p Chemo/Radiation completed 8/18   . Colon polyp    . Dysphagia    . Hypothyroid    . Non-small cell lung cancer, right (CMS [place])     s/p Chemo completed 9/18   . Raynaud's disease    . STD (sexually transmitted disease)     HSV   . Unspecified disorder of thyroid     Hypothyroidism         Past Surgical Hx:    Past Surgical History:   Procedure Laterality Date   . HX CATARACT REMOVAL Bilateral 2011   . HX COLONOSCOPY  2009   . HX ELBOW SURGERY     . HX FOOT SURGERY     . HX TUBAL LIGATION  1978   . LUNG BIOPSY Right 10/02/2016   . MEDIASTINOSCOPY  11/13/2016   . PB FOREARM/WRIST SURGERY UNLISTED  1999    carpal tunnel release   . PORTACATH PLACEMENT  12/12/2016         Past Social Hx:       Social History     Social History Narrative   . Not on file         Patient Information      Precautions:  Fall risk     Barriers to learning:  None noted           Prior level of function     Usual living arrangement:spouse     Type of dwellling:  Lives in her son and daughter in [place] home in basement apartment     Physical barriers in the home environment:  No steps     Previously independent with:  Mobility/ambulation       Assistive devices used at home:  Darwin wheeled walker     Comments:  Patient lives with her spouse in her son and daughter in [place] home in a basement apartment without any steps.  She was using a FWW for gait.  She has assistance for her ADL  Subjective:     Numeric pain scale:  0out of 10     Comments:  No reports of pain at this time.  "I feel pretty good"    Objective:     Cognition:  Person, Place, Time and Situation     Communication ability:  good     Range of Motion     RUE ROM WNL?  yes     LUE ROM WNL?  yes     RLE ROM WNL?  yes     Strength     LLE ROM WNL?  yes     RUE strength WNL?  Grossly 4 out of 5     LUE strength WNL?grossly 4 out of 5     RLE strength WNL?  Grossly 4 out of 5     LLE strength WNL?  Grossly 4 out of 5          Sensation:  Intact      Functional Ability       Supine - Sit:  Presented up in the chair     Sit - Supine:  Returned to the chair     Sitting balance:  Good      Sit - stand:  Standby assist     Stand balance:  Good        Comments:  Patient presented up in the chair.  She stood from the chair with SBA.  Returned to the chair post gait    Ambulation     Ambulation surface:  Level and In hallway     Ambulation ability:  Standby assist and Contact guard assist     Assistive devices:  Rolling walker       Ambulation tolerance:  Good     Comments: patient was able to ambulate approx. 125 feet with CGA/SBA of 1 and the FWW. Gait was steady without any LOB noted.  Patient returned to the chair without incident        Assessment     Assessment:  Patient is moving well.  She appears to be at her baseline.  She performed transfers with SBA.  She  ambulated a good distance with the FWW and CGA/SBA.  Gait was steady without any LOB.  Patient does not require acute PT at this time.  Would recommend a home health PT eval post DC.   [phone] [id number] [phone]   History No personal factors or co-morbidities 1-2 personal factors or co-morbidities 3 or more personal factors or co-morbidities   Examination of Body Systems Addressing 1-2 elements Addressing a total of 3 or more elements Addressing a total of 4 or more elements   Clinical Presentation Stable Evolving Unstable   Typical Face-to-Face Time (minutes) 20 30 45   Clinical Decision Making (Complexity) Low Moderate High         Goals:        Physical therapy SHORT TERM goals:  N/A as there are no acute PT needs     Physical therapy LONG TERM goals:  N/A as there are no acute PT needs  Patient and/or family Goals/Expectations:  Wants to go home: Realistic    Plan:       Patient to be seen: DC as there are no acute PT needs    PT Recommendations for Nursing:  ambulate with FWW and assistance  PT Recommendations for Patient/Family::  Call for assistance when getting OOB    Additional information:     Anticipated rehab needs at discharge:  Home health services  Patient has been advised of PT diagnosis, goals and plan:  Yes     Patient/family has given verbal consent for eval, treatment: and plan of care Yes    Is the patient appropriate for skilled PT at this time?  no   Marcine Matar, PT

## 2021-03-08 NOTE — Nurses Notes (Signed)
Dr. Suzanna Obey messaged about plt count of 64, and Lovenox order.

## 2021-03-08 NOTE — Progress Notes (Signed)
[facility]  Internal  Medicine Progress Note      Gina, Barrett  Date of Admission:  03/05/2021  Date of Birth:  Oct 30, 1955  Date of Service:  03/08/2021    Chief Complaint at Presentation and Brief Summary:  Patient with metastatic lung cancer on chemotherapy presented with fever.  Admitted due to neutropenic fever. She spiked fever yesterday afternoon.     Subjective:  Denies any chest pain, shortness of breath or abdominal pain.  Generalized weakness present.  Tolerating diet without a problem.  No diarrhea.      Assessment/ Plan:      Neutropenic fever  Related to chemotherapy  Febrile yesterday afternoon  --continue empiric cefepime  --repeat CXR and get viral panel   --followup culture results, remain negative to this day  --following with Medical Oncology  --started Neupogen     Metastatic lung cancer  --followup with medical oncology as outpatient  --continue Megace, Remeron     Hypertension  --continue Bystolic   --hold nifedipine    Hypothyroidism  --hold Levothyroxine    GERD  --continue Tums p.r.n.      VTE prophylaxis: Lovenox   Disposition: Home in 1-2 days       Vital Signs:  Temp (24hrs) Max:38.4 C (101.2 F)      Temperature: 37.2 C (99 F)  BP (Non-Invasive): 128/68  MAP (Non-Invasive): 87 mmHG  Heart Rate: (!) 114  Respiratory Rate: 18  SpO2: 99 %    Current Medications:  acetaminophen (TYLENOL) tablet, 1,000 mg, Oral, Q6H PRN  arformoterol (BROVANA) 15 mcg/2 mL nebulizer solution, 15 mcg, Nebulization, 2x/day  artificial tears ophthalmic solution, 1 Drop, Both Eyes, Q1H PRN  budesonide (PULMICORT RESPULES) 0.5 mg/2 mL nebulizer suspension, 1 mg, Nebulization, 2x/day  calcium carbonate (TUMS) 500mg  (200mg  elemental calcium) chewable tablet, 500 mg, Oral, 3x/day PRN  cefepime (MAXIPIME) 2 g in NS 100 mL IVPB minibag, 2 g, Intravenous, Q12H  enoxaparin (LOVENOX) 40 mg/0.4 mL SubQ injection, 40 mg, Subcutaneous, Daily  filgrastim (NEUPOGEN) SubQ injection solution 300 mcg, 300 mcg,  Subcutaneous, Daily  folic acid (FOLVITE) tablet, 1 mg, Oral, Daily  levothyroxine (SYNTHROID) tablet, 88 mcg, Oral, Daily  loratadine (CLARITIN) tablet, 10 mg, Oral, Daily  magnesium hydroxide (MILK OF MAGNESIA) 400mg  per 60mL oral liquid, 30 mL, Oral, HS PRN  megestrol (MEGACE) 40mg  per mL oral liquid, 40 mg, Oral, 2x/day  mirtazapine (REMERON) tablet, 15 mg, Oral, NIGHTLY  nebivolol (BYSTOLIC) tablet, 5 mg, Oral, Daily  [Held by provider] NIFEdipine (PROCARDIA XL) 24 hr extended release tablet, 30 mg, Oral, Daily  nitroGLYCERIN (NITROSTAT) sublingual tablet, 0.4 mg, Sublingual, Q5 Min PRN  NS 250 mL flush bag, , Intravenous, Q1H PRN  NS flush syringe, 10 mL, Intravenous, Q8HRS  NS flush syringe, 10 mL, Intravenous, Q1H PRN  oxyCODONE-acetaminophen 10-325mg  per tablet, 1 Tablet, Oral, Q6H PRN  polyethylene glycol (MIRALAX) oral packet, 17 g, Oral, Daily  sennosides-docusate sodium (SENOKOT-S) 8.6-50mg  per tablet, 1 Tablet, Oral, NIGHTLY        Today's Physical Exam:  General: appears chronically ill. No distress.   Eyes: Pupils equal and round, reactive to light, sclera is anicteric  HEENT: Head atraumatic and normocephalic   Neck: No JVD or thyromegaly   Lungs: Clear to auscultation bilaterally. No wheezing, no crackles  Cardiovascular: S1S2 normal, rhythm is regular, no murmur  Abdomen: Not distended, bowel sounds normoactive, abdomen is Soft, non-tender with palpation  Extremities: extremities normal, atraumatic, no cyanosis or edema  Skin: Skin warm and dry   Neurologic: Grossly normal   Psychiatric: Normal affect, behavior      ROS reviewed and negative except stated in HPI and Subjective      Labs  Please indicate ordered or reviewed)  Reviewed:   I have reviewed all lab results.  Lab Results for Last 24 Hours:    Results for orders placed or performed during the hospital encounter of 03/05/21 (from the past 24 hour(s))   CBC W/AUTO DIFF   Result Value Ref Range    WBC 1.8 (L) 3.7 - 11.0 x10^3/uL    RBC 2.23  (L) 3.85 - 5.22 x10^6/uL    HGB 7.4 (L) 11.5 - 16.0 g/dL    HCT 22.9 (L) 34.8 - 46.0 %    MCV 102.7 (H) 78.0 - 100.0 fL    MCH 33.2 (H) 26.0 - 32.0 pg    MCHC 32.3 31.0 - 35.5 g/dL    RDW-CV 15.3 11.5 - 15.5 %    PLATELETS 64 (L) 150 - 400 x10^3/uL    MPV 9.7 8.7 - 12.5 fL   BASIC METABOLIC PANEL   Result Value Ref Range    SODIUM 140 136 - 145 mmol/L    POTASSIUM 4.1 3.5 - 5.1 mmol/L    CHLORIDE 114 (H) 96 - 111 mmol/L    CO2 TOTAL 16 (L) 23 - 31 mmol/L    ANION GAP 10 4 - 13 mmol/L    CALCIUM 8.7 (L) 8.8 - 10.2 mg/dL    GLUCOSE 86 65 - 125 mg/dL    BUN 17 8 - 25 mg/dL    CREATININE 0.97 0.60 - 1.05 mg/dL    BUN/CREA RATIO 18 6 - 22    ESTIMATED GFR 64 >=60 mL/min/BSA   MANUAL DIFF AND MORPHOLOGY-SYSMEX   Result Value Ref Range    NEUTROPHIL % 68 %    LYMPHOCYTE %  7 %    MONOCYTE % 16 %    EOSINOPHIL % 1 %    BASOPHIL % 2 %    NEUTROPHIL BANDS % 3 %    MYELOCYTE % 3 %    NEUTROPHIL # 1.28 (L) 1.50 - 7.70 x10^3/uL    LYMPHOCYTE # 0.13 (L) 1.00 - 4.80 x10^3/uL    MONOCYTE # 0.29 0.20 - 1.10 x10^3/uL    EOSINOPHIL # <0.10 <=0.50 x10^3/uL    BASOPHIL # <0.10 <=0.20 x10^3/uL    DOHLE BODIES Present (A) None    TOXIC GRANULATION Present (A) None    RBC MORPHOLOGY Normal RBC and PLT Morphology    RESPIRATORY VIRUS PANEL    Specimen: Nasopharyngeal Swab   Result Value Ref Range    ADENOVIRUS ARRAY Not Detected Not Detected    CORONAVIRUS 229E Not Detected Not Detected    CORONAVIRUS HKU1 Not Detected Not Detected    CORONAVIRUS NL63 Not Detected Not Detected    CORONAVIRUS OC43 Not Detected Not Detected    SARS CORONAVIRUS 2 (SARS-CoV-2) Not Detected Not Detected    METAPNEUMOVIRUS ARRAY Not Detected Not Detected    RHINOVIRUS/ENTEROVIRUS ARRAY Not Detected Not Detected    INFLUENZA A Not Detected Not Detected    INFLUENZA B ARRAY Not Detected Not Detected    PARAINFLUENZA 1 ARRAY Not Detected Not Detected    PARAINFLUENZA 2 ARRAY Not Detected Not Detected    PARAINFLUENZA 3 ARRAY Not Detected Not Detected     PARAINFLUENZA 4 ARRAY Not Detected Not Detected    RSV ARRAY Not Detected Not  Detected    BORDETELLA PARAPERTUSSIS (IS 1001) Not Detected Not Detected    BORDETELLA PERTUSSIS ARRAY Not Detected Not Detected    CHLAMYDOPHILA PNEUMONIAE ARRAY Not Detected Not Detected    MYCOPLASMA PNEUMONIAE ARRAY Not Detected Not Detected           Radiology Tests (Please indicate ordered or reviewed)  Reviewed images from this admission:   Results for orders placed or performed during the hospital encounter of 03/05/21 (from the past 24 hour(s))   XR CHEST PA AND LATERAL     Status: None    Narrative    RADIOLOGIST: Gennie Alma, MD    EXAMINATION: XR CHEST PA AND LATERAL     EXAM DATE/TIME: 03/08/2021 9:47 AM    CLINICAL INDICATION: fever. neutropenic patient    FINDINGS: Compared to study of 03/05/2021. Indwelling central venous catheter is unchanged. Heart size is within normal limits. Chronic opacity at right lung apex, unchanged. Increased interstitial markings, unchanged. Lung markings at the bases may have slightly increased since prior study. Difficult to completely exclude superimposed infiltrate. No pleural effusions. Unremarkable bones.      Impression    Chronic changes as above. Difficult to exclude subtle infiltrates at the lung bases. Clinically correlate.      Radiologist location ID: [id number]          Problem List:  Active Hospital Problems   (*Primary Problem)    Diagnosis   . *Neutropenic fever (CMS HCC)   . Port catheter in place   . Drug-induced pancytopenia (CMS HCC)   . Malignant neoplasm of lung, unspecified laterality, unspecified part of lung (CMS HCC)         Annjeanette Sarwar, MD    Portions of this note may be dictated using voice recognition software or a dictation service. Variances in spelling and vocabulary are possible and unintentional. Not all errors are caught/corrected. Please notify the [facility] if any discrepancies are noted or if the meaning of any statement is not clear.

## 2021-03-08 NOTE — Progress Notes (Signed)
[facility]  Oncology Consult  Follow Up Note    Gina Barrett, Gina Barrett, 65 y.o. female  Date of Service: 03/08/2021  Date of Birth:  [birth year]    Hospital Day:  LOS: 2 days     Subjective:  Patient seen and examined at the bedside.  Remains afebrile since yesterday evening  -had chest x-ray that today, feels well overall and eager to go home    ROS:     Review of Systems   Constitutional: Negative for appetite change, chills, fatigue and fever.   Respiratory: Negative for shortness of breath.    Cardiovascular: Negative for leg swelling.   Gastrointestinal: Negative for abdominal pain, nausea and vomiting.   Genitourinary: Negative for dysuria.    Musculoskeletal: Negative for arthralgias.   Skin: Negative for rash.   Hematological: Negative for adenopathy.        Objective     Temperature: 36.8 C (98.3 F)  Heart Rate: (!) 106  BP (Non-Invasive): 124/63  Respiratory Rate: (!) 28  SpO2: 100 %  Physical Exam  Vitals and nursing note reviewed.   Constitutional:       General: She is not in acute distress.  HENT:      Head: Normocephalic and atraumatic.   Eyes:      Conjunctiva/sclera: Conjunctivae normal.   Cardiovascular:      Rate and Rhythm: Normal rate and regular rhythm.      Heart sounds: Normal heart sounds.   Pulmonary:      Effort: Pulmonary effort is normal. No respiratory distress.      Breath sounds: Normal breath sounds.   Abdominal:      Palpations: Abdomen is soft.      Tenderness: There is no abdominal tenderness.   Musculoskeletal:         General: No swelling.   Skin:     General: Skin is warm and dry.      Coloration: Skin is not pale.      Findings: No rash.   Neurological:      General: No focal deficit present.      Mental Status: She is alert and oriented to person, place, and time.      Gait: Gait is intact.   Psychiatric:         Mood and Affect: Mood and affect normal.          Labs:    I have reviewed all lab results.  Lab Results Today:    Results for orders placed or performed  during the hospital encounter of 03/05/21 (from the past 24 hour(s))   CBC W/AUTO DIFF   Result Value Ref Range    WBC 1.8 (L) 3.7 - 11.0 x10^3/uL    RBC 2.23 (L) 3.85 - 5.22 x10^6/uL    HGB 7.4 (L) 11.5 - 16.0 g/dL    HCT 22.9 (L) 34.8 - 46.0 %    MCV 102.7 (H) 78.0 - 100.0 fL    MCH 33.2 (H) 26.0 - 32.0 pg    MCHC 32.3 31.0 - 35.5 g/dL    RDW-CV 15.3 11.5 - 15.5 %    PLATELETS 64 (L) 150 - 400 x10^3/uL    MPV 9.7 8.7 - 12.5 fL   BASIC METABOLIC PANEL   Result Value Ref Range    SODIUM 140 136 - 145 mmol/L    POTASSIUM 4.1 3.5 - 5.1 mmol/L    CHLORIDE 114 (H) 96 - 111 mmol/L    CO2 TOTAL  16 (L) 23 - 31 mmol/L    ANION GAP 10 4 - 13 mmol/L    CALCIUM 8.7 (L) 8.8 - 10.2 mg/dL    GLUCOSE 86 65 - 125 mg/dL    BUN 17 8 - 25 mg/dL    CREATININE 0.97 0.60 - 1.05 mg/dL    BUN/CREA RATIO 18 6 - 22    ESTIMATED GFR 64 >=60 mL/min/BSA   MANUAL DIFF AND MORPHOLOGY-SYSMEX   Result Value Ref Range    NEUTROPHIL % 68 %    LYMPHOCYTE %  7 %    MONOCYTE % 16 %    EOSINOPHIL % 1 %    BASOPHIL % 2 %    NEUTROPHIL BANDS % 3 %    MYELOCYTE % 3 %    NEUTROPHIL # 1.28 (L) 1.50 - 7.70 x10^3/uL    LYMPHOCYTE # 0.13 (L) 1.00 - 4.80 x10^3/uL    MONOCYTE # 0.29 0.20 - 1.10 x10^3/uL    EOSINOPHIL # <0.10 <=0.50 x10^3/uL    BASOPHIL # <0.10 <=0.20 x10^3/uL    DOHLE BODIES Present (A) None    TOXIC GRANULATION Present (A) None    RBC MORPHOLOGY Normal RBC and PLT Morphology    RESPIRATORY VIRUS PANEL    Specimen: Nasopharyngeal Swab   Result Value Ref Range    ADENOVIRUS ARRAY Not Detected Not Detected    CORONAVIRUS 229E Not Detected Not Detected    CORONAVIRUS HKU1 Not Detected Not Detected    CORONAVIRUS NL63 Not Detected Not Detected    CORONAVIRUS OC43 Not Detected Not Detected    SARS CORONAVIRUS 2 (SARS-CoV-2) Not Detected Not Detected    METAPNEUMOVIRUS ARRAY Not Detected Not Detected    RHINOVIRUS/ENTEROVIRUS ARRAY Not Detected Not Detected    INFLUENZA A Not Detected Not Detected    INFLUENZA B ARRAY Not Detected Not Detected     PARAINFLUENZA 1 ARRAY Not Detected Not Detected    PARAINFLUENZA 2 ARRAY Not Detected Not Detected    PARAINFLUENZA 3 ARRAY Not Detected Not Detected    PARAINFLUENZA 4 ARRAY Not Detected Not Detected    RSV ARRAY Not Detected Not Detected    BORDETELLA PARAPERTUSSIS (IS 1001) Not Detected Not Detected    BORDETELLA PERTUSSIS ARRAY Not Detected Not Detected    CHLAMYDOPHILA PNEUMONIAE ARRAY Not Detected Not Detected    MYCOPLASMA PNEUMONIAE ARRAY Not Detected Not Detected       Imaging Studies:  reviewed pertinent images in Epic  CXR: chronic changes, no consolidation seen          ________________________________________________________________________________________________________________________________________________     Assessment: Gina Barrett is a 65 y.o. female with metastatic lung carcinoma admitted for neutropenic fever/sepsis.  No source of infection identified.  Remains on cefepime, cultures remain negative so far.  -febrile at [facility] 9/22    Heme/Onc Diagnosis:   Metastatic non-small cell lung cancer  Bone metastasis  Pancytopenia related to chemotherapy    Recommendations:  -can be transitioned to PO Levaquin at the time of discharge to complete 7 days of antibiotics   -okay to DC Neupogen if ANC remains above 1000   -Plan on continuing with carboplatin/premetrexed regimen as scheduled; last completed on 9/15  - Follow outpatient with Dr. Holley Dexter following discharge as scheduled    Will sign off , please call with any questions  Mirna Mires, MD

## 2021-03-08 NOTE — Nurses Notes (Signed)
Report called to [facility] receiving patient in 32 A.

## 2021-03-08 NOTE — Nurses Notes (Signed)
X-ray called. Okay to bring patient down at 0930.

## 2021-03-08 NOTE — Nurses Notes (Signed)
Pt returned from X-ray.  

## 2021-03-08 NOTE — Care Plan (Signed)
Assessment:   Pt is a 65 yo female admitted for neutropenic fever. Pt has hx of metastatic lung CA. Pt noted to have febrile pancytopenia. Pt lives with her husband in the basement of her son and DIL's house. Pt has no steps to navigate. Pt was using a RW for mobility and does most ADLs IND, however DIL does assist with ADLs PRN. Pt stood with SBA and ambulated ~15 with RW and SBA to sink. Pt stood at sink and completed oral hygiene with SBA. Pt then ambulated back to chair with RW and SBA. Pt will benefit from OT for ADL retraining with ADs, strength/end. BUE, mobility retraining, and home safety tech's. Recommend discharge to home.    Goals:   Short Term Goals:  (x3-5 visits)  1. Pt. to don socks w/ mod A and AE PRN  2. Pt. to complete LB bathing with SBA  3. Pt to don LB dressing with min A    Patient and/or family Goals/Expectations:  "To go home." : Realistic    Plan:     TREATMENT PLAN/FREQUENCY:  O.T. 3-5 times per week for ADL retraining, mobility retraining, strength/end. BUE, and home safety techniques.    D/C PLANS:  Recommend discharge to home    OT Recommendations for Nursing:  Encourage patient to complete ADLs as able  OT Recommendations for Patient/Family:  Same as above    Follow patient as appropriate according to established plan of care, progressing as tolerated.     The risks/benefits of therapy have been discussed with the patient and he/she is in agreement with the established plan of care.     Therapist :     Randa Ngo, OTR/L  03/08/2021, 12:46    Time may include review of chart notes, obtaining patient's functional history from patient/family/medical staff/case management/ancillary personnel, collaboration on findings and treatment options (with the above mentioned individuals), re-assessment, and acute care rehabilitation.

## 2021-03-08 NOTE — OT Treatment (Signed)
Gina Barrett  Inpatient OT Progress Note    Patient Name: Gina Barrett  Date of Birth: 1955/12/23  Height: 154.9 cm (5' 1" )  Weight: 48.7 kg (107 lb 5.8 oz)  Room/Bed: ICU17/17  Payor: MEDICARE / Plan: MEDICARE PART A AND B / Product Type: Medicare /     Encounter Date: 03/05/2021 10:43 PM  Inpatient Admission Date: 03/06/2021    Admitting Diagnosis:  Neutropenic fever (CMS HCC) [D70.9, R50.81]    Subjective/Objective:   S: Pt agreeable to completing sponge bath     O: ADL: x1    ADL:   Pt was SU for sponge bath and completed UB bath with IND. Pt able to don BIL socks and wash her legs and feet with SBA. Pt stood and doffed brief. Pt able to complete peri hygiene. OT assisted with washing back. Once pt dried off, she required assistance to thread BIL legs through brief, but was able to stand with SBA and pull up. Pt also required assistance to don BIL socks. Pt was SU for comfort at end of session.                            Assessment:   Pt has met OT goals. Recommend d/c home.     Plan:     D/c    The risks/benefits of therapy have been discussed with the patient and he/she is in agreement with the established plan of care.     Therapist :     Randa Ngo, OTR/L  03/08/2021, 12:51    Total Treatment Time: 13 minutes    Time may include review of chart notes, obtaining patient's functional history from patient/family/medical staff/case management/ancillary personnel, collaboration on findings and treatment options (with the above mentioned individuals), re-assessment, and acute care rehabilitation.

## 2021-03-08 NOTE — Care Management Notes (Signed)
03/08/21 1032  IP CONSULT TO CARE MANAGEMENT ([facility] - DO NOT USE FOR [facility] PATIENTS)  ONE TIME     Complete  Discontinue     Process Instructions: If requesting assistance with completion of Advance Directives, please provide patient with the packet.   References:    ON CALL ([place])   Provider: (Not yet assigned)   Question: Indication For Consult: Answer: D/C NEEDS - HOME CARE          Consult received.  OT recommends discharge to home.  Awaiting PT evaluation to determine any discharge needs.

## 2021-03-08 NOTE — Care Plan (Signed)
Assessment     Assessment:  Patient is moving well.  She appears to be at her baseline.  She performed transfers with SBA.  She ambulated a good distance with the FWW and CGA/SBA.  Gait was steady without any LOB.  Patient does not require acute PT at this time.  Would recommend a home health PT eval post DC.    [phone]   History No personal factors or co-morbidities 1-2 personal factors or co-morbidities 3 or more personal factors or co-morbidities   Examination of Body Systems Addressing 1-2 elements Addressing a total of 3 or more elements Addressing a total of 4 or more elements   Clinical Presentation Stable Evolving Unstable   Typical Face-to-Face Time (minutes) 20 30 45   Clinical Decision Making (Complexity) Low Moderate High            Goals:                     Physical therapy SHORT TERM goals:  N/A as there are no acute PT needs     Physical therapy LONG TERM goals:  N/A as there are no acute PT needs  Patient and/or family Goals/Expectations:  Wants to go home: Realistic     Plan:       Patient to be seen: DC as there are no acute PT needs     PT Recommendations for Nursing:  ambulate with FWW and assistance  PT Recommendations for Patient/Family::  Call for assistance when getting OOB  Marcine Matar, PT

## 2021-03-08 NOTE — Care Plan (Signed)
Assessment:   Pt has met OT goals. Recommend d/c home.     Plan:     D/c    The risks/benefits of therapy have been discussed with the patient and he/she is in agreement with the established plan of care.     Therapist :     Randa Ngo, OTR/L  03/08/2021, 12:51    Total Treatment Time: 13 minutes    Time may include review of chart notes, obtaining patient's functional history from patient/family/medical staff/case management/ancillary personnel, collaboration on findings and treatment options (with the above mentioned individuals), re-assessment, and acute care rehabilitation.

## 2021-03-08 NOTE — OT Evaluation (Signed)
Gina Barrett  Occupational Therapy Initial Evaluation - Inpatient      Patient Name: Gina Barrett  Date of Birth: Oct 23, 1955  Height: 154.9 cm (_0 )  Weight: 48.7 kg (107 lb 5.8 oz)  Room/Bed: ICU17/17  Payor: MEDICARE / Plan: MEDICARE PART A AND B / Product Type: Medicare /     Encounter Date: 03/05/2021 10:43 PM  Inpatient Admission Date: 03/06/2021    Admitting Diagnosis:  Neutropenic fever (CMS [place]) [D70.9, R50.81]  Patient Active Problem List   Diagnosis   . Sjoegren syndrome   . Malignant neoplasm of lung, unspecified laterality, unspecified part of lung (CMS HCC)   . Drug-induced pancytopenia (CMS HCC)   . Other neutropenia (CMS [place])   . Port catheter in place   . Dysphagia   . Non-small cell lung cancer, right (CMS [place])   . 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 Anemia   . Neutropenic fever (CMS HCC)       [name] is a 65 y.o., female, admitted for neutropenic fever. Pt has hx of metastatic lung CA. Pt noted to have febrile pancytopenia.     Past Medical Hx:    Past Medical History:   Diagnosis Date   . Abnormal Pap smear     ASCUS, ASCUS cannot rule out HGSIL   . Cancer (CMS HCC)     non small cell lung cancer (R)-s/p Chemo/Radiation completed 8/18   . Colon polyp    . Dysphagia    . Hypothyroid    . Non-small cell lung cancer, right (CMS [place])     s/p Chemo completed 9/18   . Raynaud's disease    . STD (sexually transmitted disease)     HSV   . Unspecified disorder of thyroid     Hypothyroidism         Past Surgical Hx:    Past Surgical History:   Procedure Laterality Date   . HX CATARACT REMOVAL Bilateral 2011   . HX COLONOSCOPY  2009   . HX ELBOW SURGERY     . HX FOOT SURGERY     . HX TUBAL LIGATION  1978   . LUNG BIOPSY Right 10/02/2016   . MEDIASTINOSCOPY  11/13/2016   . PB FOREARM/WRIST SURGERY UNLISTED  1999    carpal tunnel release   . PORTACATH PLACEMENT  12/12/2016         Past Social Hx:       Social History     Social History Narrative    . Not on file        [phone]   History Minimal review of history related to current functional performance Expanded review of physical, cognitive or psychosocial history related to current functional performance Extensive review of physical, cognitive or psychosocial history related to current functional performance   Examination of Body Systems Addressing 1-3 performance deficits Addressing a total of 3-5 performance deficits Addressing a total of 5 or more performance deficits   Clinical Presentation Modification of tasks not necessary to complete the evaluation Min-moderate modification of tasks or assistance necessary to complete the evaluation Significant modification of tasks or assistance necessary to complete the evaluation   Typical Face-to-Face Time (minutes) 30 45 60   Clinical Decision Making (Complexity) Low Moderate High     Subjective/Objective:     SUBJECTIVE:  Pt stated, " I get my hair and nails done about every 6 weeks "  Pain Scale (0-10):  0 /10    PRECAUTIONS:  None    PRIOR LEVEL OF FUNCTION:  Pt lives with her husband in the basement of her son and DIL's house. Pt has no steps to navigate. Pt was using a RW for mobility and does most ADLs IND, however DIL does assist with ADLs PRN.     COMMUNICATION SKILLS:  Good ,able to make needs known    COGNITION:     Memory:  Alert and oriented x 4. Intact STM & LTM     Follows Directions:   Follows 2-step commands      Safety Awareness:  Good ,aware of safety concerns    ADL SKILLS:     UE Bathing: IND  UE Dressing IND  LE Bathing: Mod  LE Dressing: Mod  Hygiene: IND      Eating:  Independent    LEISURE INTERESTS / SKILLS:  Enjoys TV    ADAPTIVE EQUIPMENT:  Has RW, w/c, BSC, hip kit for LB ADLs, shower chair, rail on bed    FUNCTIONAL MOBILITY: Pt stood with SBA and ambulated ~15 with RW and SBA to sink. Pt stood at sink and completed oral hygiene with SBA. Pt then ambulated back to chair with RW and  SBA.    BALANCE:   Sitting:  Static: Good   Dynamic:  Good      Standing: Static: Good      Dynamic:  Fair     ROM:   RUE:  WFL   LUE:  WFL     STRENGTH:   RUE:   4/5   LUE: 4/5    COORDINATION:    Fine Motor:  RUE: Good  LUE:  Good     Gross Motor:  RUE: Good  LUE:   Good      ENDURANCE / ACTIVITY TOLERANCE: Fair    VISUAL / PERCEPTUAL SKILLS:  Intact    SENSATION:     Intact to light touch     PROPRIOCEPTION:  Intact                           Assessment:   Pt is a 65 yo female admitted for neutropenic fever. Pt has hx of metastatic lung CA. Pt noted to have febrile pancytopenia. Pt lives with her husband in the basement of her son and DIL's house. Pt has no steps to navigate. Pt was using a RW for mobility and does most ADLs IND, however DIL does assist with ADLs PRN. Pt stood with SBA and ambulated ~15 with RW and SBA to sink. Pt stood at sink and completed oral hygiene with SBA. Pt then ambulated back to chair with RW and SBA. Pt will benefit from OT for ADL retraining with ADs, strength/end. BUE, mobility retraining, and home safety tech's. Recommend discharge to home.    Goals:   Short Term Goals:  (x3-5 visits)  1. Pt. to don socks w/ mod A and AE PRN  2. Pt. to complete LB bathing with SBA  3. Pt to don LB dressing with min A    Patient and/or family Goals/Expectations:  "To go home." : Realistic    Plan:     TREATMENT PLAN/FREQUENCY:  O.T. 3-5 times per week for ADL retraining, mobility retraining, strength/end. BUE, and home safety techniques.    D/C PLANS:  Recommend discharge to home    OT Recommendations for Nursing:  Encourage patient to complete ADLs  as able  OT Recommendations for Patient/Family:  Same as above    Follow patient as appropriate according to established plan of care, progressing as tolerated.     The risks/benefits of therapy have been discussed with the patient and he/she is in agreement with the established plan of care.     Therapist :     Randa Ngo, OTR/L  03/08/2021,  12:46    Time may include review of chart notes, obtaining patient's functional history from patient/family/medical staff/case management/ancillary personnel, collaboration on findings and treatment options (with the above mentioned individuals), re-assessment, and acute care rehabilitation.

## 2021-03-09 LAB — BASIC METABOLIC PANEL
ANION GAP: 9 mmol/L (ref 4–13)
BUN/CREA RATIO: 16 (ref 6–22)
BUN: 17 mg/dL (ref 8–25)
CALCIUM: 8.8 mg/dL (ref 8.8–10.2)
CHLORIDE: 112 mmol/L — ABNORMAL HIGH (ref 96–111)
CO2 TOTAL: 17 mmol/L — ABNORMAL LOW (ref 23–31)
CREATININE: 1.06 mg/dL — ABNORMAL HIGH (ref 0.60–1.05)
ESTIMATED GFR: 58 mL/min/BSA — ABNORMAL LOW (ref >=60)
GLUCOSE: 91 mg/dL (ref 65–125)
POTASSIUM: 4.2 mmol/L (ref 3.5–5.1)
SODIUM: 138 mmol/L (ref 136–145)

## 2021-03-09 LAB — ADULT ROUTINE BLOOD CULTURE, SET OF 2 BOTTLES (BACTERIA AND YEAST): BLOOD CULTURE, ROUTINE: NO GROWTH

## 2021-03-09 LAB — MANUAL DIFF AND MORPHOLOGY-SYSMEX
BASOPHIL #: <0.1 10*3/uL (ref <=0.20)
BASOPHIL %: 0 %
EOSINOPHIL #: <0.1 10*3/uL (ref <=0.50)
EOSINOPHIL %: 0 %
LYMPHOCYTE #: 0.27 10*3/uL — ABNORMAL LOW (ref 1.00–4.80)
LYMPHOCYTE %: 15 %
METAMYELOCYTE %: 1 %
MONOCYTE #: 0.32 10*3/uL (ref 0.20–1.10)
MONOCYTE %: 18 %
MYELOCYTE %: 1 %
NEUTROPHIL #: 1.17 10*3/uL — ABNORMAL LOW (ref 1.50–7.70)
NEUTROPHIL %: 63 %
NEUTROPHIL BANDS %: 2 %
RBC MORPHOLOGY: NORMAL

## 2021-03-09 LAB — CBC W/AUTO DIFF
HCT: 23.8 % — ABNORMAL LOW (ref 34.8–46.0)
HGB: 7.5 g/dL — ABNORMAL LOW (ref 11.5–16.0)
MCH: 33.2 pg — ABNORMAL HIGH (ref 26.0–32.0)
MCHC: 31.5 g/dL (ref 31.0–35.5)
MCV: 105.3 fL — ABNORMAL HIGH (ref 78.0–100.0)
MPV: 11.3 fL (ref 8.7–12.5)
PLATELETS: 57 10*3/uL — ABNORMAL LOW (ref 150–400)
RBC: 2.26 10*6/uL — ABNORMAL LOW (ref 3.85–5.22)
RDW-CV: 15.4 % (ref 11.5–15.5)
WBC: 1.8 10*3/uL — ABNORMAL LOW (ref 3.7–11.0)

## 2021-03-09 MED ORDER — POLYETHYLENE GLYCOL 3350 17 GRAM ORAL POWDER PACKET
17.0000 g | Freq: Every day | ORAL | 0 refills | Status: AC | PRN
Start: 2021-03-09 — End: 2021-04-25

## 2021-03-09 MED ORDER — LEVOFLOXACIN 500 MG TABLET
500.0000 mg | ORAL_TABLET | Freq: Every day | ORAL | 0 refills | Status: DC
Start: 2021-03-09 — End: 2021-03-16

## 2021-03-09 MED ORDER — OXYCODONE-ACETAMINOPHEN 10 MG-325 MG TABLET
1.0000 | ORAL_TABLET | Freq: Four times a day (QID) | ORAL | 0 refills | Status: DC | PRN
Start: 2021-03-09 — End: 2021-04-16

## 2021-03-09 NOTE — Progress Notes (Signed)
[facility]  HOSPITALIST INPATIENT PROGRESS NOTE      Gina Barrett  Date of Birth:  [birth year]     PCP: Lanier Ensign, FNP-C    Date of Admission:  03/05/2021    Date of Service:  03/09/2021    Chief Complaint and Hospital Course:  Admitted with fever. PMH - metastatic lung cancer, Raynauds, hypothyroidism  Cultures negative, highest temp of 99      SUBJECTIVE     No overnight events            ASSESSMENT & PLAN   Problem List:  Active Hospital Problems   (*Primary Problem)    Diagnosis   . *Neutropenic fever (CMS HCC)   . Port catheter in place   . Drug-induced pancytopenia (CMS HCC)   . Malignant neoplasm of lung, unspecified laterality, unspecified part of lung (CMS HCC)       Neutropenic fever - no source identified  Normal CXR, viral panel resp  On cefepime  DC filgrastim - ANC>1000    Metastatic lung cancer  Pancytopenia 2/2 chemo  Oncology on board    HTN  C/w bystolic  Nifedipine on hold - BP controlled     Hypothyroidism - c/w replacement    Dispo: YTBD    DVT prophylaxis: lovenox    Code status: Full code    Creta Levin, MD   [facility]          OBJECTIVE     Vital Signs:  Temp (24hrs) Max:37.7 C (99.9 F)      Temperature: 36.9 C (98.5 F)  BP (Non-Invasive): 127/76  MAP (Non-Invasive): 96 mmHG  Heart Rate: 98  Respiratory Rate: (!) 22  SpO2: 100 %      Objective     Current Medications:  acetaminophen (TYLENOL) tablet, 1,000 mg, Oral, Q6H PRN  arformoterol (BROVANA) 15 mcg/2 mL nebulizer solution, 15 mcg, Nebulization, 2x/day  artificial tears ophthalmic solution, 1 Drop, Both Eyes, Q1H PRN  budesonide (PULMICORT RESPULES) 0.5 mg/2 mL nebulizer suspension, 1 mg, Nebulization, 2x/day  calcium carbonate (TUMS) 500mg  (200mg  elemental calcium) chewable tablet, 500 mg, Oral, 3x/day PRN  cefepime (MAXIPIME) 2 g in NS 100 mL IVPB minibag, 2 g, Intravenous, Q12H  enoxaparin (LOVENOX) 40 mg/0.4 mL SubQ injection, 40 mg, Subcutaneous, Daily  filgrastim (NEUPOGEN) SubQ injection  solution 300 mcg, 300 mcg, Subcutaneous, Daily  folic acid (FOLVITE) tablet, 1 mg, Oral, Daily  levothyroxine (SYNTHROID) tablet, 88 mcg, Oral, Daily  loratadine (CLARITIN) tablet, 10 mg, Oral, Daily  magnesium hydroxide (MILK OF MAGNESIA) 400mg  per 47mL oral liquid, 30 mL, Oral, HS PRN  megestrol (MEGACE) 40mg  per mL oral liquid, 40 mg, Oral, 2x/day  mirtazapine (REMERON) tablet, 15 mg, Oral, NIGHTLY  nebivolol (BYSTOLIC) tablet, 10 mg, Oral, Daily  [Held by provider] NIFEdipine (PROCARDIA XL) 24 hr extended release tablet, 30 mg, Oral, Daily  nitroGLYCERIN (NITROSTAT) sublingual tablet, 0.4 mg, Sublingual, Q5 Min PRN  NS 250 mL flush bag, , Intravenous, Q1H PRN  NS flush syringe, 10 mL, Intravenous, Q8HRS  NS flush syringe, 10 mL, Intravenous, Q1H PRN  oxyCODONE-acetaminophen 10-325mg  per tablet, 1 Tablet, Oral, Q6H PRN  polyethylene glycol (MIRALAX) oral packet, 17 g, Oral, Daily  sennosides-docusate sodium (SENOKOT-S) 8.6-50mg  per tablet, 1 Tablet, Oral, NIGHTLY        Today's Physical Exam:  General: appears in good health. No distress.   Eyes: Pupils equal and round, reactive to light and accomodation.   HENT:Head atraumatic and normocephalic  Neck: No JVD or thyromegaly or lymphadenopathy   Lungs: CTAB, non labored breathing, no rales or wheezing.    Cardiovascular: regular rate and rhythm, S1, S2 normal, no murmur,   Abdomen: Soft, non-tender, Bowel sounds normal  Extremities: extremities normal, atraumatic, no cyanosis or edema   Skin: Skin warm and dry   Neurologic: Grossly normal   Psychiatric: Normal affect, behavior,         I/O:  I/O last 24 hours:      Intake/Output Summary (Last 24 hours) at 03/09/2021 0806  Last data filed at 03/08/2021 1300  Gross per 24 hour   Intake 240 ml   Output 200 ml   Net 40 ml     I/O current shift:  No intake/output data recorded.      Labs  Please indicate ordered or reviewed)  Reviewed: I have reviewed all lab results.        Radiology Tests (Please indicate ordered or  reviewed)  Reviewed: personally reviewed all available images    Medical decision making:  All pertinent labs were personally reviewed with additional follow-up lab work ordered as deemed clinically appropriate.  All available imaging results and EKGs were reviewed and independently interpreted.  Pertinent previous medical record results were reviewed.  Further workup and treatment depending on clinical cours            Portions of this note may be dictated using voice recognition software or a dictation service. Variances in spelling and vocabulary are possible and unintentional. Not all errors are caught/corrected. Please notify the [facility] if any discrepancies are noted or if the meaning of any statement is not clear.

## 2021-03-09 NOTE — Care Plan (Signed)
Problem: Adult Inpatient Plan of Care  Goal: Plan of Care Review  Outcome: Adequate for Discharge  Goal: Patient-Specific Goal (Individualized)  Outcome: Adequate for Discharge  Goal: Absence of Hospital-Acquired Illness or Injury  Outcome: Adequate for Discharge  Intervention: Identify and Manage Fall Risk  Recent Flowsheet Documentation  Taken 03/09/2021 0815 by Marjory Sneddon, RN  Safety Promotion/Fall Prevention:   activity supervised   fall prevention program maintained   muscle strengthening facilitated   nonskid shoes/slippers when out of bed   safety round/check completed  Intervention: Prevent Skin Injury  Recent Flowsheet Documentation  Taken 03/09/2021 0815 by Marjory Sneddon, RN  Body Position: positioned/repositioned independently  Intervention: Prevent and Manage VTE (Venous Thromboembolism) Risk  Recent Flowsheet Documentation  Taken 03/09/2021 0815 by Marjory Sneddon, RN  VTE Prevention/Management:   ambulation promoted   dorsiflexion/plantar flexion performed   bleeding risk factor(s) identified, physician notified  Goal: Optimal Comfort and Wellbeing  Outcome: Adequate for Discharge  Intervention: Provide Person-Centered Care  Recent Flowsheet Documentation  Taken 03/09/2021 0815 by Marjory Sneddon, [place] Relationship/Rapport:   care explained   emotional support provided   choices provided   empathic listening provided   questions answered   questions encouraged   reassurance provided   thoughts/feelings acknowledged  Goal: Rounds/Family Conference  Outcome: Adequate for Discharge     Problem: Fever  Goal: Body Temperature in Desired Range  Outcome: Adequate for Discharge     Problem: Skin Injury Risk Increased  Goal: Skin Health and Integrity  Outcome: Adequate for Discharge  Intervention: Optimize Skin Protection  Recent Flowsheet Documentation  Taken 03/09/2021 0815 by Marjory Sneddon, RN  Pressure Reduction Techniques: frequent weight shift encouraged  Pressure Reduction Devices:   (L.M,H,VH) Pressure  redistributing mattress utilized   (M,H,VH) Use Repositioning Devices or Pillows  Skin Protection:   adhesive use limited   tubing/devices free from skin contact   transparent dressing maintained  Head of Bed ([place]) Positioning: HOB elevated     Problem: Fall Injury Risk  Goal: Absence of Fall and Fall-Related Injury  Outcome: Adequate for Discharge  Intervention: Identify and Manage Contributors  Recent Flowsheet Documentation  Taken 03/09/2021 0815 by Marjory Sneddon, RN  Self-Care Promotion:   independence encouraged   BADL personal objects within reach   BADL personal routines maintained  Intervention: Promote Injury-Free Environment  Recent Flowsheet Documentation  Taken 03/09/2021 0815 by Marjory Sneddon, RN  Safety Promotion/Fall Prevention:   activity supervised   fall prevention program maintained   muscle strengthening facilitated   nonskid shoes/slippers when out of bed   safety round/check completed     Problem: Fever (Fever with Neutropenia)  Goal: Baseline Body Temperature  Outcome: Adequate for Discharge     Problem: Infection Risk (Fever with Neutropenia)  Goal: Absence of Infection  Outcome: Adequate for Discharge

## 2021-03-09 NOTE — Nurses Notes (Signed)
Platelets 57 this morning, secure chat sent to Dr Algis Downs regarding Lovenox. Will await his recommendation piror to administering.

## 2021-03-09 NOTE — Nurses Notes (Signed)
Patient temp 99.8 temporal and 99.0 oral this AM, Dr Algis Downs notified via secure chat.

## 2021-03-09 NOTE — Discharge Summary (Signed)
[facility]    DISCHARGE SUMMARY      PATIENT NAME:  Gina Barrett, Gina Barrett  MRN:  [id number]  DOB:  1955-08-31    ADMISSION DATE:  03/05/2021  DISCHARGE DATE:  03/09/2021    ATTENDING PHYSICIAN: Creta Levin, MD  PRIMARY CARE PHYSICIAN: Lanier Ensign, FNP-C     ADMISSION DIAGNOSIS: Neutropenic fever (CMS [place])  DISCHARGE DIAGNOSIS:   Active Hospital Problems    Diagnosis Date Noted   . Principle Problem: Neutropenic fever (CMS HCC) [D70.9, R50.81] 03/06/2021   . Port catheter in place [facility] 03/09/2017   . Drug-induced pancytopenia (CMS HCC) [I29.798] 02/02/2017   . Malignant neoplasm of lung, unspecified laterality, unspecified part of lung (CMS HCC) [C34.90] 12/14/2016      Resolved Hospital Problems   No resolved problems to display.     Active Non-Hospital Problems    Diagnosis Date Noted   . Anemia 01/31/2021   . 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 (CMS [place]) 07/18/2020   . Dysphagia    . Non-small cell lung cancer, right (CMS [place])    . Other neutropenia (CMS [place]) 02/02/2017   . 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      Details   levoFLOXacin 500 mg Tablet  Commonly known as: LEVAQUIN   500 mg, Oral, DAILY  Qty: 7 Tablet  Refills: 0        CONTINUE these medications which have CHANGED during your visit.      Details   polyethylene glycol 17 gram Powder in Packet  Commonly known as: MIRALAX  What changed:    when to take this   reasons to take this   17 g, Oral, DAILY PRN  Qty: 10 Packet  Refills: 0        CONTINUE these medications - NO CHANGES were made during your visit.      Details   clobetasoL-emollient no.65 0.05 % Combo Pack   Apply externally, 2 TIMES DAILY, To buttock  Refills: 0     clotrimazole 1 % Cream  Commonly known as: LOTRIMIN   Topical  Refills: 0     Desoximetasone 0.25 % Cream  Commonly known as: TOPICORT   Topical, 2 TIMES DAILY, To Ear  Refills: 0     folic acid 1 mg Tablet  Commonly  known as: FOLVITE   TAKE ONE TABLET BY MOUTH EVERY DAY  Qty: 30 Tablet  Refills: 1     levothyroxine 88 mcg Tablet  Commonly known as: SYNTHROID   TAKE ONE TABLET BY MOUTH EVERY DAY  Refills: 0     loratadine 10 mg Tablet  Commonly known as: CLARITIN   10 mg, DAILY  Refills: 0     MediHoney (honey) 80 % Gel  Generic drug: leptospermum honey   2 TIMES DAILY  Refills: 0     megestroL 40 mg Tablet  Commonly known as: MEGACE   40 mg, Oral  Refills: 0     mirtazapine 15 mg Tablet  Commonly known as: REMERON   TAKE ONE TABLET BY MOUTH EVERY EVENING  Qty: 30 Tablet  Refills: 1     MULTIVITAMIN ORAL   Oral, DAILY, Centrum Silver Women's 50+   Refills: 0     nebivoloL 5 mg Tablet  Commonly known as: BYSTOLIC   Refills: 0     oxyCODONE-acetaminophen 10-325 mg Tablet  Commonly known as: PERCOCET   1 Tablet, Oral, EVERY 6 HOURS PRN  Qty: 15 Tablet  Refills: 0     Senna Plus 8.6-50 mg Tablet  Generic drug: sennosides-docusate sodium   TAKE ONE TABLET BY MOUTH EVERY EVENING  Qty: 90 Tablet  Refills: 1     UNKNOWN MEDICATION  Commonly known as: UNKNOWN MEDICATION   Ophthalmic, [name] 128 Eye Drops OTC 4x daily [name] 128 Eye Gel OTC Nightly   Refills: 0        STOP taking these medications.    NIFEdipine 30 mg Tablet Extended Rel 24 hr  Commonly known as: PROCARDIA XL          DISCHARGE INSTRUCTIONS:   No discharge procedures on file.   Follow-up Information     Lanier Ensign, FNP-C In 2 weeks.    Specialty: NURSE PRACTITIONER  Why: Discharge follow up  Contact information:  [address]  [address]  [place] [address]  [phone]             Go to Mertie Moores, MD.    Specialties: HEMATOLOGY-ONCOLOGY, MEDICAL ONCOLOGY  Contact information:  [address]  [address]  [place] [address]  [phone]                            [place] COURSE:  This is a 65 y.o., female with PMH of metastatic lung cancer, Raynaud's, hypothyroidism, presents with neutropenic fever. No source identified,  cultures remained negative, was on cefepime, given dose of filgrastim, currently ANC>1000, s/b oncology, no further fevers and pt will finish course of abx at home. Other c/c conditions remaining stable.    Objective     Physical exam :     BP 117/71   Pulse 100   Temp 37.2 C (99 F)   Resp 20   Ht 1.549 m (5\' 1" )   Wt 47.6 kg (105 lb)   SpO2 100%   BMI 19.84 kg/m         General Appearance: No acute distress  HEENT: Normocephalic. PERRL. No nasal discharge. Neck Supple.   Cardiac: Normal rate and rhythm, No murmurs appreciated, There is no peripheral edema or cyanosis.   Lung: Clear to auscultation without rales, rhonchi, or wheezing.   Abdomen: Positive bowel sounds. Soft, Non-distended, Non-tender, No guarding or rebound. No masses.   Musculoskeletal: No joint deformity, erythema or tenderness. Full ROM  Neurological: Normal motor, sensory, and mental status on exam. Reflexes 2+ throughout.   Skin: Normal color, intact, no rashes.  Psychiatric: Awake, alert and cooperative. Normal affect.     Results for orders placed or performed during the hospital encounter of 03/05/21 (from the past 24 hour(s))   CBC W/AUTO DIFF   Result Value Ref Range    WBC 1.8 (L) 3.7 - 11.0 x10^3/uL    RBC 2.26 (L) 3.85 - 5.22 x10^6/uL    HGB 7.5 (L) 11.5 - 16.0 g/dL    HCT 23.8 (L) 34.8 - 46.0 %    MCV 105.3 (H) 78.0 - 100.0 fL    MCH 33.2 (H) 26.0 - 32.0 pg    MCHC 31.5 31.0 - 35.5 g/dL    RDW-CV 15.4 11.5 - 15.5 %    PLATELETS 57 (L) 150 - 400 x10^3/uL    MPV 11.3 8.7 - 12.5 fL   BASIC METABOLIC PANEL   Result Value Ref Range    SODIUM  138 136 - 145 mmol/L    POTASSIUM 4.2 3.5 - 5.1 mmol/L    CHLORIDE 112 (H) 96 - 111 mmol/L    CO2 TOTAL 17 (L) 23 - 31 mmol/L    ANION GAP 9 4 - 13 mmol/L    CALCIUM 8.8 8.8 - 10.2 mg/dL    GLUCOSE 91 65 - 125 mg/dL    BUN 17 8 - 25 mg/dL    CREATININE 1.06 (H) 0.60 - 1.05 mg/dL    BUN/CREA RATIO 16 6 - 22    ESTIMATED GFR 58 (L) >=60 mL/min/BSA   MANUAL DIFF AND MORPHOLOGY-SYSMEX   Result  Value Ref Range    NEUTROPHIL % 63 %    LYMPHOCYTE %  15 %    MONOCYTE % 18 %    EOSINOPHIL % 0 %    BASOPHIL % 0 %    NEUTROPHIL BANDS % 2 %    METAMYELOCYTE %  1 %    MYELOCYTE % 1 %    NEUTROPHIL # 1.17 (L) 1.50 - 7.70 x10^3/uL    LYMPHOCYTE # 0.27 (L) 1.00 - 4.80 x10^3/uL    MONOCYTE # 0.32 0.20 - 1.10 x10^3/uL    EOSINOPHIL # <0.10 <=0.50 x10^3/uL    BASOPHIL # <0.10 <=0.20 x10^3/uL    DOHLE BODIES Present (A) None    TOXIC VACUOLIZATION Present (A) None    RBC MORPHOLOGY Normal RBC and PLT Morphology        XR CHEST PA AND LATERAL    Result Date: 03/08/2021  RADIOLOGIST: Gennie Alma, MD EXAMINATION: XR CHEST PA AND LATERAL EXAM DATE/TIME: 03/08/2021 9:47 AM CLINICAL INDICATION: fever. neutropenic patient FINDINGS: Compared to study of 03/05/2021. Indwelling central venous catheter is unchanged. Heart size is within normal limits. Chronic opacity at right lung apex, unchanged. Increased interstitial markings, unchanged. Lung markings at the bases may have slightly increased since prior study. Difficult to completely exclude superimposed infiltrate. No pleural effusions. Unremarkable bones.     Chronic changes as above. Difficult to exclude subtle infiltrates at the lung bases. Clinically correlate. Radiologist location ID: [id number]     XR AP MOBILE CHEST    Result Date: 03/06/2021  SAMARIYAH COWLES Female, 65 years old. XR AP MOBILE CHEST performed on 03/05/2021 11:32 PM. REASON FOR EXAM:  Sepsis TECHNIQUE: 1 views/1 images submitted for interpretation. COMPARISON:  Chest x-ray dated 05/02/2019. FINDINGS:  Shallow inspiration. Opacity right apex again present. Multifocal patchy opacity at the left greater the right mid and lower lung zones is again noted. Small pleural effusions are not excluded. Heart silhouette is stable from prior study. Right-sided internal jugular central line/port in place.     Hyperinflation and chronic appearing interstitial change, superimposed infiltrate is difficult to exclude. Consider  short-term follow-up. Radiologist location ID: [id number]     ECG 12-LEAD    Result Date: 03/06/2021  Sinus tachycardia Otherwise normal ECG When compared with ECG of 05-Apr-2019 19:24, PR interval has decreased Confirmed by [facility] MD, MICHAEL ([id number]), editor Cameron Ali, Kaitlin [phone]) on 03/06/2021 12:58:16 PM           CONDITION ON DISCHARGE: Alert, Oriented and VS Stable    DISCHARGE DISPOSITION:  Home discharge     AVS reviewed with patient/care giver.  A written copy of the AVS and discharge instructions given to the patient/care giver. Patient/Family was in agreement, endorsed understanding, and all questions were answered. Patient/care giver encouraged to follow up with PCP as indicated.  In the event of an emergency,  patient/care giver instructed to call 911 or go to the nearest emergency room.     cc: Primary Care Physician:  Lanier Ensign, [place] [address]  [place] [address]     SH:[id number] Physician:  No referring provider defined for this encounter.     Time spent on disposition: Approximately 45-55 minutes.     Portions of this note may be dictated using voice recognition software or a dictation service. Variances in spelling and vocabulary are possible and unintentional. Not all errors are caught/corrected. Please notify the [facility] if any discrepancies are noted or if the meaning of any statement is not clear.     Creta Levin, MD

## 2021-03-09 NOTE — Nurses Notes (Signed)
Patient to be discharged. Patient states she has WC, walker, BSC, and all needed equipment at home. Denies any needs for home.

## 2021-03-09 NOTE — Nurses Notes (Signed)
Patient discharged home with family.  AVS reviewed with patient and husband.  A written copy of the AVS and discharge instructions was given to the patient/care giver.  Questions sufficiently answered as needed.  Patient/care giver encouraged to follow up with PCP and oncologist as indicated.  In the event of an emergency, patient/care giver instructed to call 911 or go to the nearest emergency room. Patient educated on neutropenic precautions and bleeding precautions, husband and patient verbalized understanding of these. IV removed, patient dressed in own clothes, belongings collected. Husband to transport patient home, will pick up prescriptions on the way home.

## 2021-03-10 LAB — ADULT ROUTINE BLOOD CULTURE, SET OF 2 BOTTLES (BACTERIA AND YEAST): BLOOD CULTURE, ROUTINE: NO GROWTH

## 2021-03-12 ENCOUNTER — Other Ambulatory Visit (HOSPITAL_COMMUNITY): Payer: Self-pay | Admitting: Hematology & Oncology

## 2021-03-12 DIAGNOSIS — C801 Malignant (primary) neoplasm, unspecified: Secondary | ICD-10-CM

## 2021-03-12 MED ORDER — CHOLECALCIFEROL (VITAMIN D3) 25 MCG (1,000 UNIT) TABLET
2000.0000 [IU] | ORAL_TABLET | Freq: Every day | ORAL | 7 refills | Status: DC
Start: 2021-03-12 — End: 2021-05-18

## 2021-03-14 ENCOUNTER — Encounter (HOSPITAL_COMMUNITY): Payer: Self-pay | Admitting: Hematology & Oncology

## 2021-03-14 ENCOUNTER — Telehealth (HOSPITAL_COMMUNITY): Payer: Self-pay

## 2021-03-14 ENCOUNTER — Other Ambulatory Visit: Payer: Self-pay

## 2021-03-14 ENCOUNTER — Other Ambulatory Visit (HOSPITAL_COMMUNITY): Payer: Self-pay | Admitting: Hematology & Oncology

## 2021-03-14 ENCOUNTER — Inpatient Hospital Stay (HOSPITAL_BASED_OUTPATIENT_CLINIC_OR_DEPARTMENT_OTHER)
Admission: RE | Admit: 2021-03-14 | Discharge: 2021-03-14 | Disposition: A | Discharge status: Home / Self Care | Payer: Medicare Other | Source: Ambulatory Visit | Attending: Hematology & Oncology | Admitting: Hematology & Oncology

## 2021-03-14 DIAGNOSIS — M7989 Other specified soft tissue disorders: Secondary | ICD-10-CM

## 2021-03-14 DIAGNOSIS — I82412 Acute embolism and thrombosis of left femoral vein: Secondary | ICD-10-CM

## 2021-03-14 MED ORDER — RIVAROXABAN 15 MG (42)-20 MG (9) TABLETS IN A STARTER PACK
ORAL_TABLET | ORAL | 0 refills | Status: DC
Start: 2021-03-14 — End: 2021-04-11

## 2021-03-14 MED ORDER — APIXABAN 5 MG (74 TABS) TABLETS IN A DOSE PACK
ORAL_TABLET | ORAL | 0 refills | Status: DC
Start: 2021-03-14 — End: 2021-03-14

## 2021-03-14 NOTE — Telephone Encounter (Signed)
Pt's insurance won't pay for Eliquis. Pt may need different Rx. Will check with social worker, Judson Roch, if there is any financial aid.    Dr. Holley Dexter notified.    Servando Snare  Triage nurse

## 2021-03-14 NOTE — Progress Notes (Signed)
We received a call from the ultrasound technician regarding left lower extremity femoral nonocclusive DVT on ultrasound done today.  I spoke to patient about initiation of Eliquis 10 mg every 12 hours for 7 days followed by 5 mg every 12 hours.  She has appointment to see me next week for further evaluation.  The risk of anticoagulation and specifically bleeding a discussed with patient.

## 2021-03-16 ENCOUNTER — Emergency Department (EMERGENCY_DEPARTMENT_HOSPITAL): Payer: Medicare Other

## 2021-03-16 ENCOUNTER — Inpatient Hospital Stay (HOSPITAL_COMMUNITY): Payer: Medicare Other

## 2021-03-16 ENCOUNTER — Other Ambulatory Visit: Payer: Self-pay

## 2021-03-16 ENCOUNTER — Inpatient Hospital Stay
Admission: EM | Admit: 2021-03-16 | Discharge: 2021-03-19 | DRG: 812 | Disposition: A | Discharge status: Home / Self Care | Payer: Medicare Other | Attending: Internal Medicine | Admitting: Internal Medicine

## 2021-03-16 ENCOUNTER — Encounter (HOSPITAL_COMMUNITY): Payer: Self-pay

## 2021-03-16 DIAGNOSIS — R042 Hemoptysis: Secondary | ICD-10-CM | POA: Insufficient documentation

## 2021-03-16 DIAGNOSIS — Z923 Personal history of irradiation: Secondary | ICD-10-CM

## 2021-03-16 DIAGNOSIS — E039 Hypothyroidism, unspecified: Secondary | ICD-10-CM | POA: Diagnosis present

## 2021-03-16 DIAGNOSIS — Z86718 Personal history of other venous thrombosis and embolism: Secondary | ICD-10-CM

## 2021-03-16 DIAGNOSIS — Z9221 Personal history of antineoplastic chemotherapy: Secondary | ICD-10-CM

## 2021-03-16 DIAGNOSIS — R918 Other nonspecific abnormal finding of lung field: Secondary | ICD-10-CM

## 2021-03-16 DIAGNOSIS — Z79890 Hormone replacement therapy (postmenopausal): Secondary | ICD-10-CM

## 2021-03-16 DIAGNOSIS — Z87891 Personal history of nicotine dependence: Secondary | ICD-10-CM

## 2021-03-16 DIAGNOSIS — I7300 Raynaud's syndrome without gangrene: Secondary | ICD-10-CM | POA: Diagnosis present

## 2021-03-16 DIAGNOSIS — D649 Anemia, unspecified: Principal | ICD-10-CM | POA: Diagnosis present

## 2021-03-16 DIAGNOSIS — R509 Fever, unspecified: Secondary | ICD-10-CM | POA: Diagnosis not present

## 2021-03-16 DIAGNOSIS — Z7901 Long term (current) use of anticoagulants: Secondary | ICD-10-CM

## 2021-03-16 DIAGNOSIS — D61811 Other drug-induced pancytopenia: Secondary | ICD-10-CM | POA: Diagnosis present

## 2021-03-16 DIAGNOSIS — Z20822 Contact with and (suspected) exposure to covid-19: Secondary | ICD-10-CM | POA: Diagnosis present

## 2021-03-16 DIAGNOSIS — I82402 Acute embolism and thrombosis of unspecified deep veins of left lower extremity: Secondary | ICD-10-CM | POA: Diagnosis present

## 2021-03-16 DIAGNOSIS — C799 Secondary malignant neoplasm of unspecified site: Secondary | ICD-10-CM | POA: Diagnosis present

## 2021-03-16 DIAGNOSIS — C3490 Malignant neoplasm of unspecified part of unspecified bronchus or lung: Secondary | ICD-10-CM | POA: Diagnosis present

## 2021-03-16 LAB — MANUAL DIFF AND MORPHOLOGY-SYSMEX
BASOPHIL #: <0.1 10*3/uL (ref <=0.20)
BASOPHIL %: 0 %
EOSINOPHIL #: <0.1 10*3/uL (ref <=0.50)
EOSINOPHIL %: 0 %
LYMPHOCYTE #: 0.49 10*3/uL — ABNORMAL LOW (ref 1.00–4.80)
LYMPHOCYTE %: 12 %
METAMYELOCYTE %: 4 %
MONOCYTE #: 0.62 10*3/uL (ref 0.20–1.10)
MONOCYTE %: 15 %
MYELOCYTE %: 2 %
NEUTROPHIL #: 2.75 10*3/uL (ref 1.50–7.70)
NEUTROPHIL %: 62 %
NEUTROPHIL BANDS %: 5 %

## 2021-03-16 LAB — ECG 12-LEAD
Atrial Rate: 117 {beats}/min
Calculated P Axis: 82 degrees
Calculated R Axis: -29 degrees
Calculated T Axis: 70 degrees
PR Interval: 194 ms
QRS Duration: 60 ms
QT Interval: 316 ms
QTC Calculation: 440 ms
Ventricular rate: 117 {beats}/min

## 2021-03-16 LAB — CBC WITH DIFF
HCT: 18.2 % — ABNORMAL LOW (ref 34.8–46.0)
HGB: 5.9 g/dL — CL (ref 11.5–16.0)
MCH: 33.9 pg — ABNORMAL HIGH (ref 26.0–32.0)
MCHC: 32.4 g/dL (ref 31.0–35.5)
MCV: 104.6 fL — ABNORMAL HIGH (ref 78.0–100.0)
MPV: 11.1 fL (ref 8.7–12.5)
PLATELETS: 79 10*3/uL — ABNORMAL LOW (ref 150–400)
RBC: 1.74 10*6/uL — ABNORMAL LOW (ref 3.85–5.22)
RDW-CV: 15.8 % — ABNORMAL HIGH (ref 11.5–15.5)
WBC: 4.1 10*3/uL (ref 3.7–11.0)

## 2021-03-16 LAB — COMPREHENSIVE METABOLIC PANEL, NON-FASTING
ALBUMIN: 3 g/dL — ABNORMAL LOW (ref 3.4–4.8)
ALKALINE PHOSPHATASE: 69 U/L (ref 55–145)
ALT (SGPT): 31 U/L — ABNORMAL HIGH (ref 8–22)
ANION GAP: 11 mmol/L (ref 4–13)
AST (SGOT): 29 U/L (ref 8–45)
BILIRUBIN TOTAL: 0.1 mg/dL — ABNORMAL LOW (ref 0.3–1.3)
BUN/CREA RATIO: 16 (ref 6–22)
BUN: 20 mg/dL (ref 8–25)
CALCIUM: 8.9 mg/dL (ref 8.8–10.2)
CHLORIDE: 108 mmol/L (ref 96–111)
CO2 TOTAL: 20 mmol/L — ABNORMAL LOW (ref 23–31)
CREATININE: 1.23 mg/dL — ABNORMAL HIGH (ref 0.60–1.05)
ESTIMATED GFR: 48 mL/min/BSA — ABNORMAL LOW (ref >=60)
GLUCOSE: 87 mg/dL (ref 65–125)
POTASSIUM: 3.9 mmol/L (ref 3.5–5.1)
PROTEIN TOTAL: 6.9 g/dL (ref 6.0–8.0)
SODIUM: 139 mmol/L (ref 136–145)

## 2021-03-16 LAB — PT/INR
INR: 1.73
PROTHROMBIN TIME: 19.7 seconds — ABNORMAL HIGH (ref 9.4–12.5)

## 2021-03-16 LAB — TROPONIN-I: TROPONIN I: 19 ng/L (ref 7–30)

## 2021-03-16 LAB — BPAM PACKED CELL ORDER: UNIT DIVISION: 0

## 2021-03-16 LAB — THYROID STIMULATING HORMONE (SENSITIVE TSH): TSH: 2.725 u[IU]/mL (ref 0.430–3.550)

## 2021-03-16 LAB — COVID-19 ~~LOC~~ MOLECULAR LAB TESTING: SARS-CoV-2: NOT DETECTED

## 2021-03-16 LAB — TYPE AND CROSS RED CELLS - UNITS: UNITS ORDERED: 2

## 2021-03-16 MED ORDER — IOPAMIDOL 370 MG IODINE/ML (76 %) INTRAVENOUS SOLUTION
100.0000 mL | INTRAVENOUS | Status: AC
Start: 2021-03-16 — End: 2021-03-16
  Administered 2021-03-16 (×2): 75 mL via INTRAVENOUS
  Filled 2021-03-16: qty 100

## 2021-03-16 MED ORDER — ACETAMINOPHEN 325 MG TABLET
650.0000 mg | ORAL_TABLET | ORAL | Status: AC
Start: 2021-03-16 — End: 2021-03-16
  Administered 2021-03-16: 650 mg via ORAL
  Filled 2021-03-16: qty 2

## 2021-03-16 MED ORDER — ACETAMINOPHEN 325 MG TABLET
650.0000 mg | ORAL_TABLET | Freq: Four times a day (QID) | ORAL | Status: DC | PRN
Start: 2021-03-16 — End: 2021-03-19
  Administered 2021-03-16 – 2021-03-19 (×5): 650 mg via ORAL
  Filled 2021-03-16 (×4): qty 2

## 2021-03-16 MED ORDER — SODIUM CHLORIDE 0.9 % (FLUSH) INJECTION SYRINGE
10.0000 mL | INJECTION | Freq: Three times a day (TID) | INTRAMUSCULAR | Status: DC
Start: 2021-03-16 — End: 2021-03-19
  Administered 2021-03-16: 0 mL via INTRAVENOUS
  Administered 2021-03-16 – 2021-03-17 (×2): 10 mL via INTRAVENOUS
  Administered 2021-03-17 (×2): 0 mL via INTRAVENOUS
  Administered 2021-03-17 – 2021-03-18 (×4): 10 mL via INTRAVENOUS
  Administered 2021-03-19: 0 mL via INTRAVENOUS

## 2021-03-16 MED ORDER — NITROGLYCERIN 0.4 MG SUBLINGUAL TABLET
0.4000 mg | SUBLINGUAL_TABLET | SUBLINGUAL | Status: DC | PRN
Start: 2021-03-16 — End: 2021-03-19

## 2021-03-16 MED ORDER — SODIUM CHLORIDE 0.9 % IV BOLUS
40.0000 mL | INJECTION | Freq: Once | Status: AC | PRN
Start: 2021-03-16 — End: 2021-03-16

## 2021-03-16 MED ORDER — LEVOTHYROXINE 88 MCG TABLET
88.0000 ug | ORAL_TABLET | Freq: Every day | ORAL | Status: DC
Start: 2021-03-16 — End: 2021-03-19
  Administered 2021-03-16 – 2021-03-19 (×5): 88 ug via ORAL
  Filled 2021-03-16 (×6): qty 1

## 2021-03-16 MED ORDER — SODIUM CHLORIDE 0.9% FLUSH BAG - 250 ML
INTRAVENOUS | Status: AC | PRN
Start: 2021-03-16 — End: ?

## 2021-03-16 MED ORDER — MIRTAZAPINE 15 MG TABLET
15.0000 mg | ORAL_TABLET | Freq: Every evening | ORAL | Status: DC
Start: 2021-03-16 — End: 2021-03-19
  Administered 2021-03-16 – 2021-03-18 (×3): 15 mg via ORAL
  Filled 2021-03-16 (×3): qty 1

## 2021-03-16 MED ORDER — SENNOSIDES 8.6 MG-DOCUSATE SODIUM 50 MG TABLET
1.0000 | ORAL_TABLET | Freq: Every evening | ORAL | Status: DC
Start: 2021-03-16 — End: 2021-03-19
  Administered 2021-03-16 – 2021-03-17 (×2): 1 via ORAL
  Administered 2021-03-18: 0 via ORAL
  Filled 2021-03-16 (×2): qty 1

## 2021-03-16 MED ORDER — ENOXAPARIN 60 MG/0.6 ML SUB-Q SYRINGE - EAST
1.0000 mg/kg | INJECTION | Freq: Two times a day (BID) | SUBCUTANEOUS | Status: DC
Start: 2021-03-17 — End: 2021-03-19
  Administered 2021-03-17 – 2021-03-19 (×5): 50 mg via SUBCUTANEOUS
  Filled 2021-03-16 (×5): qty 0.6

## 2021-03-16 MED ORDER — MORPHINE 4 MG/ML INTRAVENOUS SOLUTION
4.0000 mg | INTRAVENOUS | Status: DC | PRN
Start: 2021-03-16 — End: 2021-03-19

## 2021-03-16 MED ORDER — SODIUM CHLORIDE 0.9 % IV BOLUS
1000.0000 mL | INJECTION | Status: AC
Start: 2021-03-16 — End: 2021-03-16
  Administered 2021-03-16: 0 mL via INTRAVENOUS
  Administered 2021-03-16: 1000 mL via INTRAVENOUS

## 2021-03-16 MED ORDER — OXYCODONE-ACETAMINOPHEN 10 MG-325 MG TABLET
1.0000 | ORAL_TABLET | Freq: Four times a day (QID) | ORAL | Status: DC | PRN
Start: 2021-03-16 — End: 2021-03-19

## 2021-03-16 MED ORDER — POLYETHYLENE GLYCOL 3350 17 GRAM ORAL POWDER PACKET
17.0000 g | Freq: Every day | ORAL | Status: DC | PRN
Start: 2021-03-16 — End: 2021-03-19

## 2021-03-16 MED ORDER — CHOLECALCIFEROL (VITAMIN D3) 25 MCG (1,000 UNIT) TABLET
2000.0000 [IU] | ORAL_TABLET | Freq: Every day | ORAL | Status: DC
Start: 2021-03-16 — End: 2021-03-19
  Administered 2021-03-16 – 2021-03-19 (×4): 2000 [IU] via ORAL
  Filled 2021-03-16 (×4): qty 2

## 2021-03-16 MED ORDER — SODIUM CHLORIDE 0.9 % (FLUSH) INJECTION SYRINGE
10.0000 mL | INJECTION | INTRAMUSCULAR | Status: DC | PRN
Start: 2021-03-16 — End: 2021-03-19

## 2021-03-16 NOTE — ED Nurses Note (Signed)
Report called to [facility].

## 2021-03-16 NOTE — ED Nurses Note (Signed)
Blood bank notified of hold in blood administration.

## 2021-03-16 NOTE — ED Provider Notes (Signed)
Gina Barrett, [facility] - Emergency Department  Emergency Department Visit Note      Chief Complaint:  Hemoptysis     HISTORY OF PRESENT ILLNESS     Gina Barrett, date of birth 13-Nov-1955, is a 65 y.o.female who presents to the Emergency Department with hemoptysis. The patient states that she had a recent US imaging for issues related to her left lower extremity, and she states that she was diagnosed with a DVT in this extremity. The patient states that she was placed on Xarelto and has taken two doses of this medication at this time. She also states that she was admitted to the hospital on 03/05/21 due to a neutropenic fever of unknown origin. The patient states that she was at her baseline throughout the day yesterday, and she states that she was able to go to sleep without issues. She states that she woke up around 02:00 am this morning with a productive cough, and on examination she noticed blood tinged sputum. She states that she has had multiple episodes of hemoptysis prior to arrival to the ED. The patient denies any alleviation prior to arrival to the ED. She notes a history of lung cancer among other conditions. She denies symptoms of nausea, vomiting, diarrhea, syncope, fever, headaches, chest pain, abdominal pain, shortness of breath, and any urinary or bowel issues.     REVIEW OF SYSTEMS     The pertinent positive and negative symptoms are as per HPI. All other systems reviewed and are negative.     PATIENT HISTORY     Past Medical History:  Past Medical History:   Diagnosis Date   . Abnormal Pap smear     ASCUS, ASCUS cannot rule out HGSIL   . Cancer (CMS HCC)     non small cell lung cancer (R)-s/p Chemo/Radiation completed 8/18   . Colon polyp    . Dysphagia    . Hypothyroid    . Non-small cell lung cancer, right (CMS [place])     s/p Chemo completed 9/18   . Raynaud's disease    . STD (sexually transmitted disease)     HSV   . Unspecified disorder of thyroid     Hypothyroidism      Past Surgical History:  Past Surgical History:   Procedure Laterality Date   . Hx cataract removal Bilateral 2011   . Hx colonoscopy  2009   . Hx elbow surgery     . Hx foot surgery     . Hx tubal ligation  1978   . Lung biopsy Right 10/02/2016   . Mediastinoscopy  11/13/2016   . Pb forearm/wrist surgery unlisted  1999   . Portacath placement  12/12/2016     Family History:  Family Medical History:     Problem Relation (Age of Onset)    Breast Cancer Mother    Diabetes Paternal Grandfather, Brother    No Known Problems Father, Sister, Maternal Grandmother, Maternal Grandfather, Paternal 82, Daughter, Son, Maternal Aunt, Maternal Uncle, Paternal 84, Paternal Uncle, Other        Social History:  Social History     Tobacco Use   . Smoking status: Former Smoker     Years: 20.00     Types: Cigarettes     Start date: 12/09/1970     Quit date: 09/08/1998     Years since quitting: 22.5   . Smokeless tobacco: Never Used   Substance Use Topics   . Alcohol  use: No   . Drug use: No     Medications:  Current Outpatient Medications   Medication Sig   . cholecalciferol, vitamin D3, 25 mcg (1,000 unit) Oral Tablet Take 2 Tablets (2,000 Units total) by mouth Once a day   . clobetasoL-emollient no.65 0.05 % Apply externally Combo Pack Apply topically Twice daily To buttock   . clotrimazole (LOTRIMIN) 1 % Cream Apply topically   . Desoximetasone (TOPICORT) 0.25 % Cream Apply topically Twice daily To Ear   . levothyroxine (SYNTHROID) 88 mcg Oral Tablet TAKE ONE TABLET BY MOUTH EVERY DAY   . loratadine (CLARITIN) 10 mg Oral Tablet take 10 mg by mouth Once a day.   . megestroL (MEGACE) 40 mg Oral Tablet Take 40 mg by mouth   . mirtazapine (REMERON) 15 mg Oral Tablet TAKE ONE TABLET BY MOUTH EVERY EVENING   . MULTIVITAMIN ORAL Take by mouth Once a day Centrum Silver Women's 50+   . oxyCODONE-acetaminophen (PERCOCET) 10-325 mg Oral Tablet Take 1 Tablet by mouth Every 6 hours as needed for Pain for up to 14 days   .  polyethylene glycol (MIRALAX) 17 gram Oral Powder in Packet Take 1 Packet (17 g total) by mouth Once per day as needed (constipation) for up to 14 days   . rivaroxaban (XARELTO) 15 mg (42)- 20 mg (9) Oral Tablets, Dose Pack Take as instructed.   . SENNA PLUS 8.6-50 mg Oral Tablet TAKE ONE TABLET BY MOUTH EVERY EVENING   . UNKNOWN MEDICATION (UNKNOWN MEDICATION) Administer into affected eye(s) [name] 128 Eye Drops OTC 4x daily  [name] 128 Eye Gel OTC Nightly     Allergies:  Allergies   Allergen Reactions   . Penicillins      PHYSICAL EXAM     Vitals:  ED Triage Vitals [03/16/21 0317]   BP (Non-Invasive) 135/62   Heart Rate (!) 123   Respiratory Rate 20   Temperature 36.9 C (98.5 F)   SpO2 96 %   Weight 47.6 kg (105 lb)   Height 1.549 m (5\' 1" )     Constitutional: Appears elderly and ill.    Head: Normocephalic and atraumatic.   ENT: Moist mucous membranes. No erythema or exudates in the oropharynx. No asymmetry.  Eyes: EOM are normal. Pupils are equal, round, and reactive to light. No scleral icterus.   Neck: Neck supple. No meningismus.  Cardiovascular: Normal rate and regular rhythm. No murmur heard.  2+ distal pulses all 4 extremities.  Pulmonary/Chest: Intermittent cough with blood tinged sputum. Scattered coarse lung sounds.   Abdominal: Soft. No distension. There is no tenderness. Normal bowel sounds present.   Back: There is no CVA tenderness.   Musculoskeletal: Normal range of motion. No edema and no tenderness. No clubbing or cyanosis.  Neurological: Patient is alert and oriented to person, place, and time. Strength and sensation normal in all extremities. Normal facial symmetry and speech.   Skin: Skin is warm and dry. No rash noted.      DIAGNOSTIC STUDIES     Labs:    Results for orders placed or performed during the hospital encounter of 03/16/21   COMPREHENSIVE METABOLIC PANEL, NON-FASTING   Result Value Ref Range    SODIUM 139 136 - 145 mmol/L    POTASSIUM 3.9 3.5 - 5.1 mmol/L    CHLORIDE 108 96 - 111  mmol/L    CO2 TOTAL 20 (L) 23 - 31 mmol/L    ANION GAP 11 4 - 13 mmol/L  BUN 20 8 - 25 mg/dL    CREATININE 1.23 (H) 0.60 - 1.05 mg/dL    BUN/CREA RATIO 16 6 - 22    ESTIMATED GFR 48 (L) >=60 mL/min/BSA    ALBUMIN 3.0 (L) 3.4 - 4.8 g/dL     CALCIUM 8.9 8.8 - 10.2 mg/dL    GLUCOSE 87 65 - 125 mg/dL    ALKALINE PHOSPHATASE 69 55 - 145 U/L    ALT (SGPT) 31 (H) 8 - 22 U/L    AST (SGOT)  29 8 - 45 U/L    BILIRUBIN TOTAL 0.1 (L) 0.3 - 1.3 mg/dL    PROTEIN TOTAL 6.9 6.0 - 8.0 g/dL   TROPONIN-I   Result Value Ref Range    TROPONIN I 19 7 - 30 ng/L   CBC WITH DIFF   Result Value Ref Range    WBC 4.1 3.7 - 11.0 x10^3/uL    RBC 1.74 (L) 3.85 - 5.22 x10^6/uL    HGB 5.9 (LL) 11.5 - 16.0 g/dL    HCT 18.2 (L) 34.8 - 46.0 %    MCV 104.6 (H) 78.0 - 100.0 fL    MCH 33.9 (H) 26.0 - 32.0 pg    MCHC 32.4 31.0 - 35.5 g/dL    RDW-CV 15.8 (H) 11.5 - 15.5 %    PLATELETS 79 (L) 150 - 400 x10^3/uL    MPV 11.1 8.7 - 12.5 fL   MANUAL DIFF AND MORPHOLOGY-SYSMEX   Result Value Ref Range    NEUTROPHIL % 62 %    LYMPHOCYTE %  12 %    MONOCYTE % 15 %    EOSINOPHIL % 0 %    BASOPHIL % 0 %    NEUTROPHIL BANDS % 5 %    METAMYELOCYTE %  4 %    MYELOCYTE % 2 %    NEUTROPHIL # 2.75 1.50 - 7.70 x10^3/uL    LYMPHOCYTE # 0.49 (L) 1.00 - 4.80 x10^3/uL    MONOCYTE # 0.62 0.20 - 1.10 x10^3/uL    EOSINOPHIL # <0.10 <=0.50 x10^3/uL    BASOPHIL # <0.10 <=0.20 x10^3/uL    TOXIC GRANULATION Present (A) None   TYPE AND CROSS RED CELLS - UNITS , 2 Units   Result Value Ref Range    UNITS ORDERED 2     SPECIMEN EXPIRATION DATE 03/19/2021,2359     ABO/RH(D) PENDING     ANTIBODY SCREEN PENDING      Labs reviewed and interpreted by me.    Radiology:    XR AP MOBILE CHEST   Final Result   Stable chronic changes in the lungs. Left lower lobe opacities, may represent developing infiltrates.         Radiologist location ID: [id number]           Radiological imaging interpreted by radiologist. Report reviewed by me.    EKG:  12 lead EKG interpreted by me shows sinus  tachycardia, rate of 117 bpm, normal axis, normal intervals, no acute ST segment changes.    ED PROGRESS NOTE / [place] records reviewed by me:  I have reviewed the nurse's notes. I have reviewed the patient's problem list and pertinent past medical records.    Orders Placed This Encounter   . XR AP MOBILE CHEST   . COMPREHENSIVE METABOLIC PANEL, NON-FASTING   . TROPONIN-I   . CBC WITH DIFF   . MANUAL DIFF AND MORPHOLOGY-SYSMEX   . ECG 12-LEAD   . TYPE  AND CROSS RED CELLS - UNITS , 2 Units   . INSERT & MAINTAIN PERIPHERAL IV ACCESS   . NS bolus infusion 1,000 mL   . NS bolus infusion 40 mL            03:20: Initial evaluation is complete at this time. I discussed with the patient that I would order a chest XR, CBC, CMP, Troponin, and an ECG to further evaluate. NS bolus fluids ordered for patient symptoms. Patient is agreeable with the treatment plan at this time.     04:52: Work up reviewed at this time. The patient's lab results indicate that the patient is anemic with a hemoglobin of 5.9. The patient will require admission for further evaluation and treatment. Patient will likely require a blood transfusion. Hospitalist paged at this time.     05:23: I discussed the patient's case and above findings with Dr. Nicki Reaper (Hospitalist) who is making arrangements for further monitoring and definitive care in the hospital.    MIPS     Not applicable     OPIATE PRESCRIPTION      Not applicable    CORE MEASURES     Not applicable    CRITICAL CARE TIME     37 minutes exclusive of procedures    PRE-DISPOSITION VITALS      Pre-Disposition Vitals:  Filed Vitals:    03/16/21 0515   BP: 98/76   Pulse: (!) 114   Resp: 19   Temp:    SpO2:      CLINICAL IMPRESSION     Encounter Diagnoses   Name Primary?   . Hemoptysis Yes   . Anemia requiring transfusions       DISPOSITION/PLAN     Admitted      Condition at Disposition: Fair       Blair Heys II, SCRIBE, scribed for Gina Rubins, MD on 03/16/2021  at 3:18 AM       Documentation assistance provided for Gina Rubins, MD by scribe Royanne Foots II, [place], SCRIBE. Information recorded by the scribe was done at my direction and has been reviewed and validated by me, Gina Rubins, MD

## 2021-03-16 NOTE — Nurses Notes (Signed)
Patient received from ED via stretcher and ambulated to bed in stable condition. VSS upon admission. Dr. Lanice Schwab gave orders to give 2nd unit of blood. Patient oriented to room and environment. Bed in the low position. Call bell within reach. Bed alarm on.

## 2021-03-16 NOTE — Ancillary Notes (Signed)
Call to floor, pt is getting blood at this time, spoke to Lamoni who will inform pt's RN to call when blood is completed to come to CT.

## 2021-03-16 NOTE — H&P (Signed)
[facility]  [place], [place] [address]    General History and Physical    Glenys, Snader  Date of Admission:  03/16/2021  Date of Birth:  [birth year]    PCP: Lanier Ensign, FNP-C  Chief Complaint:  Hemoptysis      HPI: Gina Barrett is a 65 y.o., White female who presents with hemoptysis after starting Xarelto for left leg DVT 2 days ago. As per patient she has had left leg swelling for ~ 1 month and finally got a LE US done few days ago which diagnosed a DVT. She was subsequently started on Xarelto for which she has only taken 2 doses so far. Patient has known hx of metastatic lung cancer currently on chemo/ rad being followed by Dr. Holley Dexter. She reports a recent admission on 9/20 for Neutropenic fever. She was in her usual state of health until 2am when she awoke with coughing and some blood tinged sputum. She also reports few episodes of minor hemoptysis before reaching ED.   Initial labs in  ED showed H/H of 5.9/18.2 and subsequent 2U PRBC ordered by ED physician.     Active Hospital Problems   (*Primary Problem)    Diagnosis   . *Hemoptysis   . Anemia   . Drug-induced pancytopenia (CMS HCC)   . Malignant neoplasm of lung, unspecified laterality, unspecified part of lung (CMS HCC)       Past Medical History:   Diagnosis Date   . Abnormal Pap smear     ASCUS, ASCUS cannot rule out HGSIL   . Cancer (CMS HCC)     non small cell lung cancer (R)-s/p Chemo/Radiation completed 8/18   . Colon polyp    . Dysphagia    . Hypothyroid    . Non-small cell lung cancer, right (CMS [place])     s/p Chemo completed 9/18   . Raynaud's disease    . STD (sexually transmitted disease)     HSV   . Unspecified disorder of thyroid     Hypothyroidism           Past Surgical History:   Procedure Laterality Date   . HX CATARACT REMOVAL Bilateral 2011   . HX COLONOSCOPY  2009   . HX ELBOW SURGERY     . HX FOOT SURGERY     . HX TUBAL LIGATION  1978   . LUNG BIOPSY Right 10/02/2016   . MEDIASTINOSCOPY  11/13/2016    . PB FOREARM/WRIST SURGERY UNLISTED  1999    carpal tunnel release   . PORTACATH PLACEMENT  12/12/2016           Medications Prior to Admission     Prescriptions    cholecalciferol, vitamin D3, 25 mcg (1,000 unit) Oral Tablet    Take 2 Tablets (2,000 Units total) by mouth Once a day    clobetasoL-emollient no.65 0.05 % Apply externally Combo Pack    Apply topically Twice daily To buttock    clotrimazole (LOTRIMIN) 1 % Cream    Apply topically    Desoximetasone (TOPICORT) 0.25 % Cream    Apply topically Twice daily To Ear    levothyroxine (SYNTHROID) 88 mcg Oral Tablet    TAKE ONE TABLET BY MOUTH EVERY DAY    loratadine (CLARITIN) 10 mg Oral Tablet    take 10 mg by mouth Once a day.    megestroL (MEGACE) 40 mg Oral Tablet    Take 40 mg by mouth  mirtazapine (REMERON) 15 mg Oral Tablet    TAKE ONE TABLET BY MOUTH EVERY EVENING    MULTIVITAMIN ORAL    Take by mouth Once a day Centrum Silver Women's 50+    oxyCODONE-acetaminophen (PERCOCET) 10-325 mg Oral Tablet    Take 1 Tablet by mouth Every 6 hours as needed for Pain for up to 14 days    polyethylene glycol (MIRALAX) 17 gram Oral Powder in Packet    Take 1 Packet (17 g total) by mouth Once per day as needed (constipation) for up to 14 days    rivaroxaban (XARELTO) 15 mg (42)- 20 mg (9) Oral Tablets, Dose Pack    Take as instructed.    SENNA PLUS 8.6-50 mg Oral Tablet    TAKE ONE TABLET BY MOUTH EVERY EVENING    UNKNOWN MEDICATION (UNKNOWN MEDICATION)    Administer into affected eye(s) [name] 128 Eye Drops OTC 4x daily  [name] 128 Eye Gel OTC Nightly        NS bolus infusion 40 mL, 40 mL, Intravenous, Once PRN        Allergies   Allergen Reactions   . Penicillins        Social History     Tobacco Use   . Smoking status: Former Smoker     Years: 20.00     Types: Cigarettes     Start date: 12/09/1970     Quit date: 09/08/1998     Years since quitting: 22.5   . Smokeless tobacco: Never Used   Substance Use Topics   . Alcohol use: No       Family Medical History:      Problem Relation (Age of Onset)    Breast Cancer Mother    Diabetes Paternal Grandfather, Brother    No Known Problems Father, Sister, Maternal Grandmother, Maternal Grandfather, Paternal 45, Daughter, Son, Maternal Aunt, Maternal Uncle, Paternal Aunt, Paternal Uncle, Other            ROS: + hemoptysis   Denies any fever, chills, vision changes, SOB, chest pain, palpitations, nausea, vomiting, diarrhea, dysuria, extremity pain    DNR Status:  Full Code    EXAM:  Temperature: 38 C (100.4 F)  Heart Rate: (!) 113  BP (Non-Invasive): 120/63  Respiratory Rate: (!) 25  SpO2: 100 %  General: Cachectic but in no apparent distress  Eyes: Pupils equal and round, reactive to light and accomodation.   HEENT: Head atraumatic and normocephalic   Neck: No JVD or thyromegaly or lymphadenopathy   Lungs: Minimal BL crackles, equal entry BL, no wheezing   Cardiovascular: Tachy with regular rhythm, S1, S2 normal, no murmur  Abdomen: Soft, non-tender, Bowel sounds normal, No hepatosplenomegaly   Extremities: extremities normal, atraumatic, no cyanosis. Mild edema of left LE  Skin: Skin warm and dry, Chemoport on right chest  Neurologic: Grossly normal   Lymphatics: No lymphadenopathy   Psychiatric: Normal affect, behavior,         Labs:    CBC with Diff (Last 24 Hours):  Recent Results last 24 hours     03/16/21  0405   WBC 4.1   HGB 5.9*   HCT 18.2*   MCV 104.6*   PLTCNT 79*   BANDS 5   PMNS 62   LYMPHOCYTES 12   MONOCYTES 15   EOSINOPHIL 0   BASOPHILS 0  <0.10     BMP (Last 24 Hours):  Recent Results last 24 hours     03/16/21  0404  SODIUM 139   POTASSIUM 3.9   CHLORIDE 108   CO2 20*   BUN 20   CREATININE 1.23*   CALCIUM 8.9   GLUCOSENF 87           Imaging Studies:    CXR:   DVT RISK FACTORS HAVE BEN ASSESSED AND PROPHYLAXIS ORDERED (SEE RUBYONLINE - REFERENCE TOOLS - MD, DVT PROPHY OR POCKET CARD)    Assessment/Plan:  1. Hemoptysis due to Anticoagulant use vs Lung Ca  2. Severe Anemia requiring transfusion due to blood  loss vs Cancer therapy   Anticoagulation on hold, Coags ordered   2 units PRBC ordered   Telemetry monitoring   Hem/Onc consult             Capacity: Has Capacity  DVT prophylaxis. On hold due to active hemptysis  Code status. Full code.    Rhys Martini, MD

## 2021-03-16 NOTE — ED Nurses Note (Signed)
Report given to Sara, RN.

## 2021-03-16 NOTE — ED Nurses Note (Signed)
Consulted with Nicki Reaper , MD regarding elevated temperature. Verbal orders provided.  Holding second unit of blood for 1-2hrs then reassess per MD.

## 2021-03-16 NOTE — ED Nurses Note (Signed)
Primary nurse notified of patient.

## 2021-03-16 NOTE — ED Triage Notes (Signed)
Pt states she woke up spitting up blood into tissues.  Pt states she had an ultrasound and was told she has a blood clot in her left leg. States she was prescribed Xarelto and has taken 2 doses total.

## 2021-03-16 NOTE — Progress Notes (Signed)
Chart reviewed. Discussed with Dr An. Chest CT for PE. Which was negative for PE. Consolidation unchanged from previous scans. S/p 2uPRBC. Will challenge with lovenox tomorrow.

## 2021-03-17 DIAGNOSIS — C3490 Malignant neoplasm of unspecified part of unspecified bronchus or lung: Secondary | ICD-10-CM

## 2021-03-17 DIAGNOSIS — R042 Hemoptysis: Secondary | ICD-10-CM

## 2021-03-17 DIAGNOSIS — Z7901 Long term (current) use of anticoagulants: Secondary | ICD-10-CM

## 2021-03-17 DIAGNOSIS — Z86718 Personal history of other venous thrombosis and embolism: Secondary | ICD-10-CM

## 2021-03-17 DIAGNOSIS — Z880 Allergy status to penicillin: Secondary | ICD-10-CM

## 2021-03-17 DIAGNOSIS — R509 Fever, unspecified: Secondary | ICD-10-CM

## 2021-03-17 DIAGNOSIS — D649 Anemia, unspecified: Principal | ICD-10-CM

## 2021-03-17 DIAGNOSIS — Z87891 Personal history of nicotine dependence: Secondary | ICD-10-CM

## 2021-03-17 LAB — COMPREHENSIVE METABOLIC PANEL, NON-FASTING
ALBUMIN: 2.7 g/dL — ABNORMAL LOW (ref 3.4–4.8)
ALKALINE PHOSPHATASE: 63 U/L (ref 55–145)
ALT (SGPT): 23 U/L — ABNORMAL HIGH (ref 8–22)
ANION GAP: 6 mmol/L (ref 4–13)
AST (SGOT): 24 U/L (ref 8–45)
BILIRUBIN TOTAL: 0.4 mg/dL (ref 0.3–1.3)
BUN/CREA RATIO: 19 (ref 6–22)
BUN: 21 mg/dL (ref 8–25)
CALCIUM: 8.8 mg/dL (ref 8.8–10.2)
CHLORIDE: 113 mmol/L — ABNORMAL HIGH (ref 96–111)
CO2 TOTAL: 20 mmol/L — ABNORMAL LOW (ref 23–31)
CREATININE: 1.1 mg/dL — ABNORMAL HIGH (ref 0.60–1.05)
ESTIMATED GFR: 55 mL/min/BSA — ABNORMAL LOW (ref >=60)
GLUCOSE: 83 mg/dL (ref 65–125)
POTASSIUM: 3.7 mmol/L (ref 3.5–5.1)
PROTEIN TOTAL: 6.5 g/dL (ref 6.0–8.0)
SODIUM: 139 mmol/L (ref 136–145)

## 2021-03-17 LAB — BPAM PACKED CELL ORDER: UNIT DIVISION: 0

## 2021-03-17 LAB — PHOSPHORUS: PHOSPHORUS: 2.8 mg/dL (ref 2.3–4.0)

## 2021-03-17 LAB — MANUAL DIFF AND MORPHOLOGY-SYSMEX
BASOPHIL #: <0.1 10*3/uL (ref <=0.20)
BASOPHIL %: 1 %
EOSINOPHIL #: <0.1 10*3/uL (ref <=0.50)
EOSINOPHIL %: 0 %
LYMPHOCYTE #: 0.26 10*3/uL — ABNORMAL LOW (ref 1.00–4.80)
LYMPHOCYTE %: 8 %
METAMYELOCYTE %: 1 %
MONOCYTE #: 0.58 10*3/uL (ref 0.20–1.10)
MONOCYTE %: 20 %
MYELOCYTE %: 5 %
NEUTROPHIL #: 1.86 10*3/uL (ref 1.50–7.70)
NEUTROPHIL %: 61 %
NEUTROPHIL BANDS %: 3 %
RBC MORPHOLOGY: NORMAL
REACTIVE LYMPHOCYTE %: 1 %

## 2021-03-17 LAB — CBC WITH DIFF
HCT: 28.2 % — ABNORMAL LOW (ref 34.8–46.0)
HGB: 9.6 g/dL — ABNORMAL LOW (ref 11.5–16.0)
MCH: 31.9 pg (ref 26.0–32.0)
MCHC: 34 g/dL (ref 31.0–35.5)
MCV: 93.7 fL (ref 78.0–100.0)
MPV: 10.8 fL (ref 8.7–12.5)
PLATELETS: 89 10*3/uL — ABNORMAL LOW (ref 150–400)
RBC: 3.01 10*6/uL — ABNORMAL LOW (ref 3.85–5.22)
RDW-CV: 17 % — ABNORMAL HIGH (ref 11.5–15.5)
WBC: 2.9 10*3/uL — ABNORMAL LOW (ref 3.7–11.0)

## 2021-03-17 LAB — TYPE AND CROSS RED CELLS - UNITS
ABO/RH(D): O POS
ANTIBODY SCREEN: NEGATIVE

## 2021-03-17 LAB — MAGNESIUM: MAGNESIUM: 1.6 mg/dL — ABNORMAL LOW (ref 1.8–2.6)

## 2021-03-17 MED ORDER — MAGNESIUM SULFATE 2 GRAM/50 ML (4 %) IN WATER INTRAVENOUS PIGGYBACK
2.0000 g | INJECTION | Freq: Once | INTRAVENOUS | Status: AC
Start: 2021-03-17 — End: 2021-03-17
  Administered 2021-03-17: 0 g via INTRAVENOUS
  Administered 2021-03-17: 2 g via INTRAVENOUS
  Filled 2021-03-17: qty 50

## 2021-03-17 MED ORDER — POTASSIUM CHLORIDE ER 10 MEQ CAPSULE,EXTENDED RELEASE
20.0000 meq | ORAL_CAPSULE | Freq: Two times a day (BID) | ORAL | Status: AC
Start: 2021-03-17 — End: 2021-03-17
  Administered 2021-03-17 (×2): 20 meq via ORAL
  Filled 2021-03-17 (×2): qty 2

## 2021-03-17 NOTE — Ancillary Notes (Signed)
[facility]  Medical Nutrition Therapy Screen Note                                      Date of Service: 03/17/2021    Reason for Note: Nursing Nutritional Risk notification:  BMI < 20 for an Adult patient    Current Diet Order/Nutrition Support:  DIET REGULAR    Reviewed patient status, diet order/TF/TPN, labs and medications.  Height: 154.9 cm (5\' 1" )   Weight: 49.9 kg (110 lb) (03/17/21 0349)   Body mass index is 20.78 kg/m.    Brief Subjective:   65 yo F with admission for hemoptysis - hx of metastatic lung cancer on chemo/radiation. Recently DVT for which pt started Xarelto.  Additional PMH: dysphagia, hypothyroid, raynaud's disease  Diet is Regular; intakes 25% x 2 so far. No reports of N/V.  Braden Score = 20; nutrition = 3; adequate. Last BM 10/2; smear - ordered Miralax and senokot-s.  Skin - intact.  Nutrition Related Labs: Cr 1.10 (H), GFR 55 (L), Mg 1.6 (L), Alb 2.7 (L), Hgb 9.6 (L)  Nutrition Related Meds: Vit D3, synthroid, remeron, Miralax, senokot-s  Weight has actually been trending up over the past 6 months according to chart weight hx. Currently, pt is 90.5% of IBW (55.3 kg).     Weight Hx per Chart:   44 kg (12/18/20)   42.3 kg (11/28/20)   40 kg (10/25/20)   40.4 kg (09/02/20)     Most Recent Screen Previous screen   Impaired Nutrition Status Score Impaired Nutrition Status Score: 0 - Normal nutritional status (03/17/21 0800)   Impaired Nutrition Status Score: 0 - Normal nutritional status (03/17/21 0800)           Severity of Disease Score Severity of Disease Score: 1 - Chronic patients in particular with acute complications: cirrhosis, COPD, dialysis, oncology, recent bariatric surgery - Mild (03/17/21 0800)   Severity of Disease Score: 1 - Chronic patients in particular with acute complications: cirrhosis, COPD, dialysis, oncology, recent bariatric surgery - Mild (03/17/21 0800)       Age Age: 66 - < 70 years (03/17/21 0800) Age: 66 - < 70 years  (03/17/21 0800)     Score Score: 1 (03/17/21 0800)     Score: 1 (03/17/21 0800)   Risk Level Risk Level: Level 1 - 0 to 1 pt - No initial note required (03/17/21 0800)   Risk Level: Level 1 - 0 to 1 pt - No initial note required (03/17/21 0800)           Nutrition related problems: Underweight status r/t hx of metastatic lung cancer receiving treatment aeb BMI of 20.78 with 25% PO meal completions so far.     Assessment: Needs assessed/reassessed in 3-4 days    Monitor: Po status, Tolerance of diet and intakes    Plan/Intervention: Will continue to monitor per screening protocol     - Continue with liberalized diet; Regular.  - Will add Ensure Plus High Protein BID for additional 700 kcal and 40 gm protein to encourage weight maintenance/gain.   - Continue remeron as appropriate. Continue bowel regimen to support BM regularity.     Thank you!    Moise Boring, [place]

## 2021-03-17 NOTE — Progress Notes (Addendum)
Labs reviewed this AM, H&H this AM has elevated more than what is expected which makes previous value that transfusion occurred for very questionable. I believe patient had remained at baseline even after hemoptysis. Will challenge with Lovenox BID for today and monitor for signs of bleeding, if H&H remains stable might transition back to Xarelto or continue with lovenox SQ BID. Will discuss with Heme/Onc. Will replace Mg and K. Full note to follow later today.

## 2021-03-17 NOTE — Nurses Notes (Signed)
03/17/21 1951   Vital Signs   Temperature (!) 38.7 C (101.6 F)   Temp Source Oral   Heart Rate (!) 109   Respiratory Rate (!) 24   BP (Non-Invasive) 136/74   MAP (Non-Invasive) 94 mmHG   BP Source (Non-Invasive) LA;C   Patient Position Fowlers (45-60 degrees)   Oxygen Therapy   SpO2 98 %   O2 Delivery Source RA     Dr Rosezetta Schlatter notified of vital signs. Instructed to give PRN ordered Tylenol.

## 2021-03-17 NOTE — Progress Notes (Signed)
[facility]  Internal Medicine  IP Progress Note     Name: Gina Barrett, Gina Barrett Date of Service:  03/17/2021   Date of Birth:  01/12/1956 Date of Admission:  03/16/2021   PCP: Lanier Ensign, FNP-C Attending: Merrilee Jansky, MD   MRN: [id number] Code Status: Full Code     Subjective     Chief complaint: Hemoptysis    Interval history: Labs reviewed this AM, H&H this AM has elevated more than what is expected which makes previous value that transfusion occurred for very questionable. I believe patient had remained at baseline even after hemoptysis. Will challenge with Lovenox BID for today and monitor for signs of bleeding, if H&H remains stable might transition back to Xarelto or continue with lovenox SQ BID. Will replace Mg and K.    Febrile yesterday, no identified cause. Could be tumor burden. Blood cultures sent     Assessment and Plan     Summary: Gina Barrett is a 65 y.o. year old female with PMH of  DVT on Xarelto, metastatic lung cancer who presented with hemoptysis and admitted for severe anemia and hemoptysis    Active Hospital Problems    Diagnosis   . Primary Problem: Hemoptysis   . Anemia   . Drug-induced pancytopenia (CMS HCC)   . Malignant neoplasm of lung, unspecified laterality, unspecified part of lung (CMS HCC)       Plan:    1. Severe anemia and hemoptysis: s/p 2uPRBC 10/1. H&H is much improved. Check H&H tomorrow. Likely will transition back to Xarelto.  2. Febrile episode: Unclear cause. Blood cultures sent. No Abx for now.  3. Hypothyroidism: c/w levothyroxine.  4. Metastatic lung cancer: Oncology following. No PE on CTA Chest.     DVT Prophylaxis: Lovenox 65m/kg bid  Code status: Full Code  Disposition: Home     Physical Exam     BP (!) 142/73   Pulse (!) 103   Temp 37.5 C (99.5 F)   Resp 18   Ht 1.549 m ('5\' 1"'$ )   Wt 49.9 kg (110 lb)   SpO2 95%   BMI 20.78 kg/m         Physical Exam  Vitals and nursing note reviewed.   HENT:      Head: Normocephalic and atraumatic.       Mouth/Throat:      Mouth: Mucous membranes are moist.   Eyes:      General: No scleral icterus.     Conjunctiva/sclera: Conjunctivae normal.   Cardiovascular:      Rate and Rhythm: Normal rate and regular rhythm.      Heart sounds: No murmur heard.  Pulmonary:      Effort: Pulmonary effort is normal. No respiratory distress.      Breath sounds: No wheezing.      Comments: Decreased breath sounds bilaterally  Abdominal:      General: Bowel sounds are normal. There is no distension.      Palpations: Abdomen is soft.      Tenderness: There is no abdominal tenderness.   Musculoskeletal:         General: No swelling.   Skin:     General: Skin is warm.      Coloration: Skin is not jaundiced.   Neurological:      Mental Status: She is alert and oriented to person, place, and time. Mental status is at baseline.   Psychiatric:         Mood and  Affect: Mood normal.           Intake and Output     I/O last 24 hours:      Intake/Output Summary (Last 24 hours) at 03/17/2021 1602  Last data filed at 03/17/2021 1300  Gross per 24 hour   Intake 490 ml   Output --   Net 490 ml        Medications     acetaminophen (TYLENOL) tablet, 650 mg, Oral, Q6H PRN  cholecalciferol (VITAMIN D3) 1000 unit (25 mcg) tablet, 2,000 Units, Oral, Daily  enoxaparin (LOVENOX) 60 mg/0.6 mL SubQ injection, 1 mg/kg, Subcutaneous, 2x/day  levothyroxine (SYNTHROID) tablet, 88 mcg, Oral, Daily  mirtazapine (REMERON) tablet, 15 mg, Oral, NIGHTLY  morphine 4 mg/mL injection, 4 mg, Intravenous, Q5 Min PRN  nitroGLYCERIN (NITROSTAT) sublingual tablet, 0.4 mg, Sublingual, Q5 Min PRN  NS 250 mL flush bag, , Intravenous, Q1H PRN  NS flush syringe, 10 mL, Intravenous, Q8HRS  NS flush syringe, 10 mL, Intravenous, Q1H PRN  oxyCODONE-acetaminophen 10-333m per tablet, 1 Tablet, Oral, Q6H PRN  polyethylene glycol (MIRALAX) oral packet, 17 g, Oral, Daily PRN  potassium chloride (MICRO K) 10 mEq capsule, 20 mEq, Oral, 2x/day-Food  sennosides-docusate sodium (SENOKOT-S)  8.6-50mg per tablet, 1 Tablet, Oral, NIGHTLY         Labs       Recent Labs     03/16/21  0405 03/17/21  0459   HGB 5.9* 9.6*   WBC 4.1 2.9*   PLTCNT 79* 89*       Recent Labs     03/16/21  0404 03/17/21  0459   SODIUM 139 139   POTASSIUM 3.9 3.7   MAGNESIUM  --  1.6*   CALCIUM 8.9 8.8   CREATININE 1.23* 1.10*   BUN 20 21   CO2 20* 20*       ERYTHROCYTE SEDIMENTATION RATE (ESR)   Date Value Ref Range Status   10/13/2019 40 (H) 0 - 30 mm/hr Final         Reviewed: I have reviewed all lab results.     Imaging studies          Reviewed: I have reviewed all lab results.     Microbiology     BLOOD CULTURE, ROUTINE   Date Value Ref Range Status   03/05/2021 No Growth 5 Days  Final   03/05/2021 No Growth 5 Days  Final            Medical Decision Making     All pertinent labs were personally reviewed with additional follow-up lab work ordered as deemed clinically appropriate.  All available imaging results and EKGs were reviewed and independently interpreted.  Pertinent previous medical record results were reviewed.  Further workup and treatment depending on clinical course     Provider: AMerrilee Jansky MD Date: 03/17/2021

## 2021-03-17 NOTE — Consults (Signed)
[place]  Initial Note      Eleanore, Junio Start Date:  03/16/2021  Inpatient Admission Date: 03/16/2021  Date of Service: 03/17/2021  Date of Birth:  January 07, 1956    Requesting Provider: Merrilee Jansky MD  Information Obtained from: patient; review of records  Reason for Consultation:  Bleeding/DVT of LLE extremity    HPI: Mrs. Valdes presented to the ED yesterday early morning for bloody sputum. She recalls waking up late Friday evening to use the restroom. She felt like "something was in my throat" and had a coughing episode that lasted a few minutes. She initially coughed up a sputum that appeared to have mostly coagulated blood followed by blood-tinged sputum. She denied having chest pain or other pain symptoms. No worsened shortness of breath. She was initiated on therapeutic anticoagulation with Xarelto for a left leg DVT discovered on Steffen Hase ultrasound study obtained on 03/14/21. She shares that she completed her first day of Xarelto (second dosing) just prior to her coughing episode.     Mrs. Mossbarger has not experienced any recurrent episodes of severe coughing or bloody sputum. She denies worsened pain or swelling in the left leg (as well as the right leg). She notes that the left leg was swollen for a few weeks and that Dayveon Halley ultrasound was ordered by Oncology (Dr. Holley Dexter). Recent history is notable for Blessin Kanno inpatient admission [9/20 - 9/24] for neutropenic fever. She is currently D18 from Carboplatin + Pemetrexed.   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  -PET-CT scan on 09/06/2016 showed 10 mm right upper lobe FDG avid lesion that is slightly larger than 2017 CT and concerning for malignancy.  A separate 3 cm long area of tracer uptake in the left lower lobe was noted.  -CT-guided biopsy a [facility] on 10/02/2016 was positive for adenocarcinoma that is well to moderately differentiated.  -Mediastinoscopy 11/13/2016 positive for left mid paratrachael    Stage: IV, initially diagnosed as IIIB     Molecular/special studies:  ROS-1, ALK-1, EGFR, PDL1 all negative    Treatment & Surveillance:   1. S/p chemoradiation with carboplatin + paclitaxel + RT 66.6 Gy completed 02/10/19/18    2. Serial imaging since completion of therapy has been negative for recurrence.  Most recent imaging in on 12/03/2018 was also negative but showed a new T6 compression abnormality.     3. Chest CT with contrast from 09/23/2019 which in summary showed increasing pleural based density in the LLL which may be infectious or inflammatory.  There is increased 1.7cm LN in left hilum ans stable compression abnormality of T6.    4. Biopsy of left LL pleural bases mass on 11/02/2019 showed benign fibrovascular tissue with focal chronic inflammation.  no lung parechyma or malgnancy identified.    5. Patient developed progressive musculoskeletal pain around October /November of 2021 evaluation with multiple imaging studies highly suggest metastatic disease.  Evaluation for plasma cell was noncontributory.  Patient was referred to orthopedic surgery (Dr. Hervey Ard, by family request and known to patient)  for surgical recommendation especially regarding weight-bearing.    6. Biopsy of paraspinal soft tissue mass on 07/04/2020 confirmed metastatic carcinoma consistent  with lung primary. CARIS NGS negative    7.Radiation Oncology for radiation to the acetabulum as well as the paraspinal mass started 07/19/2020 x 10 , ending 07/31/2020    8. Mediport placed  07/19/2020    9.Begin Zoledronic acid.  Has dentures  and no dental eval needed    10. Brain MRI planne2/12/2020     11.  Begin palliative carboplatin & pemetrexed on 08/03/2020       ________________________________________________________________________________________________________________________________________________     Objective   Past Medical History:   Diagnosis Date   . Abnormal Pap smear     ASCUS, ASCUS cannot rule out HGSIL   . Cancer (CMS HCC)     non small cell lung cancer (R)-s/p Chemo/Radiation completed 8/18   . Colon polyp    . Dysphagia    . Hypothyroid    . Non-small cell lung cancer, right (CMS [place])     s/p Chemo completed 9/18   . Raynaud's disease    . STD (sexually transmitted disease)     HSV   . Unspecified disorder of thyroid     Hypothyroidism         Past Surgical History:   Procedure Laterality Date   . HX CATARACT REMOVAL Bilateral 2011   . HX COLONOSCOPY  2009   . HX ELBOW SURGERY     . HX FOOT SURGERY     . HX TUBAL LIGATION  1978   . LUNG BIOPSY Right 10/02/2016   . MEDIASTINOSCOPY  11/13/2016   . PB FOREARM/WRIST SURGERY UNLISTED  1999    carpal tunnel release   . PORTACATH PLACEMENT  12/12/2016         Medications Prior to Admission     Prescriptions    cholecalciferol, vitamin D3, 25 mcg (1,000 unit) Oral Tablet    Take 2 Tablets (2,000 Units total) by mouth Once a day    clobetasoL-emollient no.65 0.05 % Apply externally Combo Pack    Apply topically Twice daily To buttock    clotrimazole (LOTRIMIN) 1 % Cream    Apply topically    Desoximetasone (TOPICORT) 0.25 % Cream    Apply topically Twice daily To Ear    levothyroxine (SYNTHROID) 88 mcg Oral Tablet    TAKE ONE TABLET BY MOUTH EVERY DAY    loratadine (CLARITIN) 10 mg Oral Tablet    take 10 mg by mouth Once a day.    megestroL (MEGACE) 40 mg Oral Tablet    Take 40 mg by mouth    mirtazapine (REMERON) 15 mg Oral Tablet    TAKE ONE TABLET BY MOUTH EVERY EVENING    MULTIVITAMIN ORAL    Take by mouth Once a day Centrum Silver Women's 50+    oxyCODONE-acetaminophen (PERCOCET)  10-325 mg Oral Tablet    Take 1 Tablet by mouth Every 6 hours as needed for Pain for up to 14 days    polyethylene glycol (MIRALAX) 17 gram Oral Powder in Packet    Take 1 Packet (17 g total) by mouth Once per day as needed (constipation) for up to 14 days    rivaroxaban (XARELTO) 15 mg (42)- 20 mg (9) Oral Tablets, Dose Pack    Take as instructed.    SENNA PLUS 8.6-50 mg Oral Tablet    TAKE ONE TABLET BY MOUTH EVERY EVENING    UNKNOWN MEDICATION (UNKNOWN MEDICATION)    Administer into affected eye(s) [name] 128 Eye Drops OTC 4x daily  [name] 128 Eye Gel OTC Nightly  acetaminophen (TYLENOL) tablet, 650 mg, Oral, Q6H PRN  cholecalciferol (VITAMIN D3) 1000 unit (25 mcg) tablet, 2,000 Units, Oral, Daily  enoxaparin (LOVENOX) 60 mg/0.6 mL SubQ injection, 1 mg/kg, Subcutaneous, 2x/day  levothyroxine (SYNTHROID) tablet, 88 mcg, Oral, Daily  mirtazapine (REMERON) tablet, 15 mg, Oral, NIGHTLY  morphine 4 mg/mL injection, 4 mg, Intravenous, Q5 Min PRN  nitroGLYCERIN (NITROSTAT) sublingual tablet, 0.4 mg, Sublingual, Q5 Min PRN  NS 250 mL flush bag, , Intravenous, Q1H PRN  NS flush syringe, 10 mL, Intravenous, Q8HRS  NS flush syringe, 10 mL, Intravenous, Q1H PRN  oxyCODONE-acetaminophen 10-336m per tablet, 1 Tablet, Oral, Q6H PRN  polyethylene glycol (MIRALAX) oral packet, 17 g, Oral, Daily PRN  potassium chloride (MICRO K) 10 mEq capsule, 20 mEq, Oral, 2x/day-Food  sennosides-docusate sodium (SENOKOT-S) 8.6-50mg per tablet, 1 Tablet, Oral, NIGHTLY      Allergies   Allergen Reactions   . Penicillins        Family History: Reviewed, discussed  Family Medical History:     Problem Relation (Age of Onset)    Breast Cancer Mother    Diabetes Paternal Grandfather, Brother    No Known Problems Father, Sister, Maternal Grandmother, Maternal Grandfather, Paternal G55 Daughter, Son, Maternal Aunt, Maternal Uncle, Paternal A40 Paternal Uncle, Other          Social History  Social History     Socioeconomic History   .  Marital status: Married     Spouse name: Not on file   . Number of children: Not on file   . Years of education: Not on file   . Highest education level: Not on file   Occupational History     Employer: [employer]  Tobacco Use   . Smoking status: Former Smoker     Years: 20.00     Types: Cigarettes     Start date: 12/09/1970     Quit date: 09/08/1998     Years since quitting: 22.5   . Smokeless tobacco: Never Used   Substance and Sexual Activity   . Alcohol use: No   . Drug use: No   . Sexual activity: Yes     Partners: Male     Birth control/protection: None   Other Topics Concern   . Abuse/Domestic Violence Not Asked   . Breast Self Exam Yes   . Caffeine Concern Not Asked   . Calcium intake adequate No   . Computer Use Not Asked   . Drives Not Asked   . Exercise Concern Not Asked   . Helmet Use Not Asked   . Seat Belt Not Asked   . Special Diet Not Asked   . Sunscreen used Not Asked   . Uses Cane Not Asked   . Uses walker Not Asked   . Uses wheelchair Not Asked   . Right hand dominant Not Asked   . Left hand dominant Not Asked   . Ambidextrous Not Asked   . Shift Work Not Asked   . Unusual Sleep-Wake Schedule Not Asked   . Ability to Walk 1 Flight of Steps without SOB/CP Yes   . Routine Exercise Not Asked   . Ability to Walk 2 Flight of Steps without SOB/CP Not Asked   . Unable to Ambulate Not Asked   . Total Care Not Asked   . Ability To Do Own ADL's Yes   . Uses Walker Not Asked   . Other Activity Level Not Asked   . Uses Cane Not  Asked   Social History Narrative   . Not on file     Social Determinants of Health     Financial Resource Strain: Not on file   Food Insecurity: Not on file   Transportation Needs: Not on file   Physical Activity: Not on file   Stress: Not on file   Intimate Partner Violence: Not on file   Housing Stability: Not on file       REVIEW OF SYSTEMS  Review of Systems   Constitutional: Negative for appetite change, fatigue and fever.   HENT:   Negative for mouth sores and sore throat.     Eyes: Negative for icterus.   Respiratory: Positive for cough and hemoptysis. Negative for chest tightness and shortness of breath.    Cardiovascular: Negative for chest pain and leg swelling.   Gastrointestinal: Negative for abdominal pain, constipation, diarrhea, nausea and vomiting.   Genitourinary: Negative for dysuria.    Musculoskeletal: Negative for arthralgias.   Skin: Negative for rash.   Neurological: Negative for headaches and light-headedness.   Hematological: Negative for adenopathy. Does not bruise/bleed easily.        EXAM  VITALS:    Temperature: 37.3 C (99.2 F)  Heart Rate: (!) 105  BP (Non-Invasive): (!) 153/79  Respiratory Rate: 18  SpO2: 99 %  Physical Exam  Vitals reviewed.   Constitutional:       General: She is not in acute distress.     Appearance: Normal appearance. She is normal weight. She is not ill-appearing.   HENT:      Head: Normocephalic and atraumatic.      Mouth/Throat:      Mouth: Mucous membranes are moist.      Pharynx: Oropharynx is clear. No posterior oropharyngeal erythema.   Eyes:      General: No scleral icterus.     Conjunctiva/sclera: Conjunctivae normal.      Pupils: Pupils are equal, round, and reactive to light.   Cardiovascular:      Rate and Rhythm: Normal rate and regular rhythm.   Pulmonary:      Effort: Pulmonary effort is normal. No respiratory distress.      Comments: Diminished breath sounds in lower lung fields bilaterally.    Abdominal:      General: There is no distension.      Palpations: Abdomen is soft. There is no mass.      Tenderness: There is no abdominal tenderness.   Musculoskeletal:      Right lower leg: No edema.      Left lower leg: No edema.   Skin:     General: Skin is warm and dry.      Coloration: Skin is not pale.      Findings: No erythema.   Neurological:      General: No focal deficit present.      Mental Status: She is oriented to person, place, and time. Mental status is at baseline.   Psychiatric:         Mood and Affect: Mood  normal.          IMAGES:  reviewed by radiology and independently by me, agree with radiologic description    Chest CTA; 10/1 - The thoracic aorta has normal caliber with mild tortuosity. Mild bilateral hilar adenopathy is present. The pulmonary arteries are opacified with no filling defect to suggest thrombus. There is no pericardial effusion, pleural effusion, or pneumothorax. Interstitial prominence noted in both lungs. Dense consolidation is  present in the right upper lobe and there are patchy areas of peripheral consolidation in the lower lobes, similar to the prior study. The adrenal glands have a stable configuration. Bony structures remain intact. Chronic wedge compression deformity is again seen at T6.    LABS:    Lab Results for Last 24 Hours:    Results for orders placed or performed during the hospital encounter of 03/16/21 (from the past 24 hour(s))   COMPREHENSIVE METABOLIC PANEL, NON-FASTING   Result Value Ref Range    SODIUM 139 136 - 145 mmol/L    POTASSIUM 3.7 3.5 - 5.1 mmol/L    CHLORIDE 113 (H) 96 - 111 mmol/L    CO2 TOTAL 20 (L) 23 - 31 mmol/L    ANION GAP 6 4 - 13 mmol/L    BUN 21 8 - 25 mg/dL    CREATININE 1.10 (H) 0.60 - 1.05 mg/dL    BUN/CREA RATIO 19 6 - 22    ESTIMATED GFR 55 (L) >=60 mL/min/BSA    ALBUMIN 2.7 (L) 3.4 - 4.8 g/dL     CALCIUM 8.8 8.8 - 10.2 mg/dL    GLUCOSE 83 65 - 125 mg/dL    ALKALINE PHOSPHATASE 63 55 - 145 U/L    ALT (SGPT) 23 (H) 8 - 22 U/L    AST (SGOT)  24 8 - 45 U/L    BILIRUBIN TOTAL 0.4 0.3 - 1.3 mg/dL    PROTEIN TOTAL 6.5 6.0 - 8.0 g/dL   MAGNESIUM   Result Value Ref Range    MAGNESIUM 1.6 (L) 1.8 - 2.6 mg/dL   PHOSPHORUS   Result Value Ref Range    PHOSPHORUS 2.8 2.3 - 4.0 mg/dL   CBC WITH DIFF   Result Value Ref Range    WBC 2.9 (L) 3.7 - 11.0 x10^3/uL    RBC 3.01 (L) 3.85 - 5.22 x10^6/uL    HGB 9.6 (L) 11.5 - 16.0 g/dL    HCT 28.2 (L) 34.8 - 46.0 %    MCV 93.7 78.0 - 100.0 fL    MCH 31.9 26.0 - 32.0 pg    MCHC 34.0 31.0 - 35.5 g/dL    RDW-CV 17.0 (H) 11.5 -  15.5 %    PLATELETS 89 (L) 150 - 400 x10^3/uL    MPV 10.8 8.7 - 12.5 fL   MANUAL DIFF AND MORPHOLOGY-SYSMEX   Result Value Ref Range    NEUTROPHIL % 61 %    LYMPHOCYTE %  8 %    MONOCYTE % 20 %    EOSINOPHIL % 0 %    BASOPHIL % 1 %    NEUTROPHIL BANDS % 3 %    METAMYELOCYTE %  1 %    MYELOCYTE % 5 %    REACTIVE LYMPHOCYTE % 1 %    NEUTROPHIL # 1.86 1.50 - 7.70 x10^3/uL    LYMPHOCYTE # 0.26 (L) 1.00 - 4.80 x10^3/uL    MONOCYTE # 0.58 0.20 - 1.10 x10^3/uL    EOSINOPHIL # <0.10 <=0.50 x10^3/uL    BASOPHIL # <0.10 <=0.20 x10^3/uL    RBC MORPHOLOGY Normal RBC and PLT Morphology        No results found for this or any previous visit (from the past [id number] hour(s)).       ________________________________________________________________________________________________________________________________________________     Impression:  CORRINE TILLIS is a 65 y.o. female with metastatic lung adenocarcinoma currently on systemic therapy with Carboplatin + Pemetrexed. Recently, she was initiated on therapeutic anticoagulation with Rivaroxaban on  03/15/21 following discovery of a non-occlusive LLE DVT on 03/14/21. She presented to the ED for further evaluation and was found to have severe anemia (Hb 5.9). She is admitted for further evaluation and management of severe anemia and suspected hemoptysis following her coughing episode w/bloody sputum.    Heme/Onc Diagnosis:   1. Metastatic lung adenocarcinoma  2. Hemoptysis  3. Fever  4. Anemia  5. LLE DVT    Recommendations:     Mrs. Gaughran has not exhibited recurrence of severe coughing or hemoptysis since her admission. She received a 2 unit PRBC transfusion with a good response. There is question of whether her CBC study at time of her initial ED evaluation was truly representative of her blood volume status (Hb 5.9) given today's CBC study (Hb 9.6). It would be unusual to have severe anemia from what seems to be a self-limited episode of blood-tinged sputum.     She has  exhibited fever (T 101 at midnight) during this admission. She does not appear particularly ill and there are no obvious causes for fever. She was recently admitted for neutropenia fever and Burech Mcfarland infectious etiology was not identified. It is unclear if her fever could be related to her coughing episode/blood tinged sputum. Her chest CTA is absent for pulmonary emboli or any new consolidations. However, she continues to have significant RUL consolidation. I question whether a Pulmonary evaluation is indicated for further evaluation.    I do not see any contraindications for continuing therapeutic anticoagulation. Options would include continuing current therapeutic Lovenox or returning to Xarelto.     - recommend Pulmonary consultation for hemoptysis for this patient would metastatic lung cancer and recurrent fever.  - no contraindication to therapeutic anticoagulation. Agree with current Lovenox dosing. May find it appropriate to resume Xarelto at time of discharge.    Will continue to follow, please call with any questions    Broadus John Gennifer Potenza, DO

## 2021-03-17 NOTE — Care Plan (Signed)
Pt presented to the ED after spitting up blood.  Pt had been on Xarelto several days for treatment of a blood clot found via [place] in her left leg.  Pt undergoing palliative chemo therapy at [facility] for NSCLC with metastasis every 3 weeks since 08/03/20 .  Admission Hgb 5.9/HCT 18.2.  Initial 2 units PRBCs ordered to be administered.      Pt recently hospitalized 03/05/21-924 for neutropenic fever.  Pt admitted as inpatient level of care to the hospitalist service for hemoptysis to the hospitalist service on 03/16/21 @ 0642.  Pt moved to room 623A @ 0931.      Pt resides in a 2 story home with spouse & adult son & DIL.  She & her husband live in a basement apartment.  Pt is Independent with the use of her rolling walker.  Pt uses [facility] for medication.     CM will monitor for needs and consults.

## 2021-03-17 NOTE — Care Management Notes (Signed)
03/17/21 1807   Assessment Detail   Assessment Type Admission   Date of Care Management Update 03/17/21   Readmission   Is this a readmission? Yes   Is this a scheduled readmission? No   Number of days between last admission and this admission? 6   Were your symptoms the same as before? no   Social Work Animal nutritionist Status initial meeting   Projected Discharge Date   (undetermined)   Anticipated Discharge Disposition Home   CM will evaluate for rehabilitation potential no   Plan Stabilitze, MMI, home   Discharge Needs Assessment   Concerns To Be Addressed medication concerns   Readmission Within the Last 30 Days current reason for admission unrelated to previous admission  (potential complication of anticoagulant)   Taking blood thinner/anticoagulant therapy at home? rivaroxaban (Xarelto)  (on hold, Lovenox in hospital)   Equipment Currently Used at Home walker, rolling;wheelchair;cane, straight   Transportation Available family or friend will provide   Referral Information   Admission Type inpatient   Arrived From home or self-care     Pt presented to the ED after spitting up blood.  Pt had been on Xarelto several days for treatment of a blood clot found via [place] in her left leg.  Pt undergoing palliative chemo therapy at [facility] for NSCLC with metastasis every 3 weeks since 08/03/20 .  Admission Hgb 5.9/HCT 18.2.  Initial 2 units PRBCs ordered to be administered.      Pt recently hospitalized 03/05/21-924 for neutropenic fever.  Pt admitted as inpatient level of care to the hospitalist service for hemoptysis to the hospitalist service on 03/16/21 @ 0642.  Pt moved to room 623A @ 0931.      Pt resides in a 2 story home with spouse & adult son & DIL.  She & her husband live in a basement apartment.  Pt is Independent with the use of her rolling walker.  Pt uses [facility] for medication.     CM will monitor for needs and consults.

## 2021-03-18 ENCOUNTER — Inpatient Hospital Stay (INDEPENDENT_AMBULATORY_CARE_PROVIDER_SITE_OTHER): Payer: Medicare Other | Admitting: Family

## 2021-03-18 ENCOUNTER — Inpatient Hospital Stay (HOSPITAL_COMMUNITY): Payer: Medicare Other

## 2021-03-18 LAB — GI PANEL BY BIOFIRE FILM ARRAY (BMC/BRX/JAX)
ADENOVIRUS F 40/41: NOT DETECTED
ASTROVIRUS: NOT DETECTED
CAMPYLOBACTER: NOT DETECTED
CLOSTRIDIUM DIFFICILE TOXIN A/B: NOT DETECTED
CRYPTOSPORIDIUM: NOT DETECTED
CYCLOSPORA CAYETANENSIS: NOT DETECTED
ENTAMOEBA HISTOLYTICA: NOT DETECTED
ENTEROAGGREGATIVE E. COLI (EAEC): NOT DETECTED
ENTEROPATHOGENIC E COLI (EPEC): NOT DETECTED
ENTEROTOXIGENIC E COLI (ETEC) LT/ST: NOT DETECTED
GIARDIA LAMBLIA: NOT DETECTED
NOROVIRUS GI/GII: NOT DETECTED
PLESIOMONAS SHIGELLOIDES: NOT DETECTED
ROTAVIRUS A: NOT DETECTED
SAPOVIRUS: NOT DETECTED
SHIGA-LIKE TOXIN-PRODUCING E COLI (STEC) STX1/STX2: NOT DETECTED
SHIGELLA/ENTEROINVASIVE E COLI (EIEC): NOT DETECTED
VIBRIO CHOLERAE: NOT DETECTED
VIBRIO: NOT DETECTED
YERSINIA ENTEROCOLITICA: NOT DETECTED

## 2021-03-18 LAB — MANUAL DIFF AND MORPHOLOGY-SYSMEX
BASOPHIL #: <0.1 10*3/uL (ref <=0.20)
BASOPHIL %: 1 %
EOSINOPHIL #: <0.1 10*3/uL (ref <=0.50)
EOSINOPHIL %: 0 %
LYMPHOCYTE #: 0.34 10*3/uL — ABNORMAL LOW (ref 1.00–4.80)
LYMPHOCYTE %: 10 %
METAMYELOCYTE %: 1 %
MONOCYTE #: 0.78 10*3/uL (ref 0.20–1.10)
MONOCYTE %: 25 %
MYELOCYTE %: 4 %
NEUTROPHIL #: 1.8 10*3/uL (ref 1.50–7.70)
NEUTROPHIL %: 52 %
NEUTROPHIL BANDS %: 6 %
RBC MORPHOLOGY: NORMAL
REACTIVE LYMPHOCYTE %: 1 %

## 2021-03-18 LAB — MAGNESIUM: MAGNESIUM: 1.9 mg/dL (ref 1.8–2.6)

## 2021-03-18 LAB — CBC W/AUTO DIFF
HCT: 29.5 % — ABNORMAL LOW (ref 34.8–46.0)
HGB: 9.9 g/dL — ABNORMAL LOW (ref 11.5–16.0)
MCH: 32.1 pg — ABNORMAL HIGH (ref 26.0–32.0)
MCHC: 33.6 g/dL (ref 31.0–35.5)
MCV: 95.8 fL (ref 78.0–100.0)
MPV: 11.3 fL (ref 8.7–12.5)
PLATELETS: 118 10*3/uL — ABNORMAL LOW (ref 150–400)
RBC: 3.08 10*6/uL — ABNORMAL LOW (ref 3.85–5.22)
RDW-CV: 16.6 % — ABNORMAL HIGH (ref 11.5–15.5)
WBC: 3.1 10*3/uL — ABNORMAL LOW (ref 3.7–11.0)

## 2021-03-18 LAB — BASIC METABOLIC PANEL
ANION GAP: 8 mmol/L (ref 4–13)
BUN/CREA RATIO: 15 (ref 6–22)
BUN: 15 mg/dL (ref 8–25)
CALCIUM: 9 mg/dL (ref 8.8–10.2)
CHLORIDE: 112 mmol/L — ABNORMAL HIGH (ref 96–111)
CO2 TOTAL: 19 mmol/L — ABNORMAL LOW (ref 23–31)
CREATININE: 1.03 mg/dL (ref 0.60–1.05)
ESTIMATED GFR: 60 mL/min/BSA (ref >=60)
GLUCOSE: 87 mg/dL (ref 65–125)
POTASSIUM: 4.1 mmol/L (ref 3.5–5.1)
SODIUM: 139 mmol/L (ref 136–145)

## 2021-03-18 LAB — GI PANEL BY BIOFIRE FILM ARRAY: SALMONELLA SPECIES: NOT DETECTED

## 2021-03-18 LAB — PHOSPHORUS: PHOSPHORUS: 2.7 mg/dL (ref 2.3–4.0)

## 2021-03-18 NOTE — Progress Notes (Signed)
[facility]  Internal Medicine  IP Progress Note     Name: Gina, Barrett Date of Service:  03/18/2021   Date of Birth:  [birth year] Date of Admission:  03/16/2021   PCP: Lanier Ensign, FNP-C Attending: Merrilee Jansky, MD   MRN: [id number] Code Status: Full Code     Subjective     Chief complaint: Hemoptysis    Interval history: Patient seen this AM, she had spiked a fever Tmax 101.6. No focus of infection. Patient underwent CT AP which was unremarkable. Patient had a large bowel movement and episode of emesis. GI panel ordered. Blood cx thus far remains negative. Will monitor for an additional day. Patient might be developing recurrent post obstructive PNA. Will likely discharge on Levaquin PO if bands continue to elevate.     Assessment and Plan     Summary: Gina Barrett is a 65 y.o. year old female with PMH of  DVT on Xarelto, metastatic lung cancer who presented with hemoptysis and admitted for severe anemia and hemoptysis    Active Hospital Problems    Diagnosis   . Primary Problem: Hemoptysis   . Anemia   . Drug-induced pancytopenia (CMS HCC)   . Malignant neoplasm of lung, unspecified laterality, unspecified part of lung (CMS HCC)       Plan:    1. Severe anemia and hemoptysis: s/p 2uPRBC 10/1. H&H is much improved. Check H&H tomorrow. Likely will transition back to Xarelto.  2. Febrile episodes: Unclear cause. Blood cultures NTD. No Abx for now. Bands increasing, will likely start Levaquin PO on discharge.  3. Hypothyroidism: c/w levothyroxine.  4. Metastatic lung cancer: Oncology following. No PE on CTA Chest.     DVT Prophylaxis: Lovenox 67m/kg bid  Code status: Full Code  Disposition: Home     Physical Exam     BP 127/68   Pulse (!) 113   Temp 36.9 C (98.4 F)   Resp 20   Ht 1.549 m (_0 )   Wt 49 kg (108 lb)   SpO2 99%   BMI 20.41 kg/m         Physical Exam  Vitals and nursing note reviewed.   HENT:      Head: Normocephalic and atraumatic.      Mouth/Throat:      Mouth:  Mucous membranes are moist.   Eyes:      General: No scleral icterus.     Conjunctiva/sclera: Conjunctivae normal.   Cardiovascular:      Rate and Rhythm: Normal rate and regular rhythm.      Heart sounds: No murmur heard.  Pulmonary:      Effort: Pulmonary effort is normal. No respiratory distress.      Breath sounds: No wheezing.      Comments: Decreased breath sounds bilaterally  Abdominal:      General: Bowel sounds are normal. There is no distension.      Palpations: Abdomen is soft.      Tenderness: There is no abdominal tenderness.   Musculoskeletal:         General: No swelling.   Skin:     General: Skin is warm.      Capillary Refill: Capillary refill takes less than 2 seconds.      Coloration: Skin is not jaundiced.   Neurological:      Mental Status: She is alert and oriented to person, place, and time. Mental status is at baseline.   Psychiatric:  Mood and Affect: Mood normal.           Intake and Output     I/O last 24 hours:      Intake/Output Summary (Last 24 hours) at 03/18/2021 1553  Last data filed at 03/18/2021 1200  Gross per 24 hour   Intake 240 ml   Output --   Net 240 ml        Medications     acetaminophen (TYLENOL) tablet, 650 mg, Oral, Q6H PRN  cholecalciferol (VITAMIN D3) 1000 unit (25 mcg) tablet, 2,000 Units, Oral, Daily  enoxaparin (LOVENOX) 60 mg/0.6 mL SubQ injection, 1 mg/kg, Subcutaneous, 2x/day  levothyroxine (SYNTHROID) tablet, 88 mcg, Oral, Daily  mirtazapine (REMERON) tablet, 15 mg, Oral, NIGHTLY  morphine 4 mg/mL injection, 4 mg, Intravenous, Q5 Min PRN  nitroGLYCERIN (NITROSTAT) sublingual tablet, 0.4 mg, Sublingual, Q5 Min PRN  NS 250 mL flush bag, , Intravenous, Q1H PRN  NS flush syringe, 10 mL, Intravenous, Q8HRS  NS flush syringe, 10 mL, Intravenous, Q1H PRN  oxyCODONE-acetaminophen 10-342m per tablet, 1 Tablet, Oral, Q6H PRN  polyethylene glycol (MIRALAX) oral packet, 17 g, Oral, Daily PRN  sennosides-docusate sodium (SENOKOT-S) 8.6-50mg per tablet, 1 Tablet, Oral,  NIGHTLY         Labs       Recent Labs     03/16/21  0405 03/17/21  0459 03/18/21  0416   HGB 5.9* 9.6* 9.9*   WBC 4.1 2.9* 3.1*   PLTCNT 79* 89* 118*       Recent Labs     03/16/21  0404 03/17/21  0459 03/18/21  0416   SODIUM 139 139 139   POTASSIUM 3.9 3.7 4.1   MAGNESIUM  --  1.6* 1.9   CALCIUM 8.9 8.8 9.0   CREATININE 1.23* 1.10* 1.03   BUN _0 CO2 20* 20* 19*       ERYTHROCYTE SEDIMENTATION RATE (ESR)   Date Value Ref Range Status   10/13/2019 40 (H) 0 - 30 mm/hr Final         Reviewed: I have reviewed all lab results.     Imaging studies     Results for orders placed or performed during the hospital encounter of 03/16/21 (from the past 24 hour(s))   CT ABDOMEN PELVIS WO IV CONTRAST     Status: None    Narrative    PROCEDURE DESCRIPTION: CT ABDOMEN PELVIS WO IV CONTRAST    CLINICAL INDICATION: fever of unknown origin    COMPARISON: CT chest abdomen and pelvis 12/21/2020, CTA chest 03/16/2021      FINDINGS: Assessment limited by the noncontrast technique. There is persistence patchy enhancement of the kidneys bilaterally, consistent with contrast-induced nephropathy. No calcified renal collecting system stones or hydronephrosis. The appendix is nonvisualized. No secondary evidence for appendicitis. No evidence for bowel obstruction. No free fluid. No free air. Redemonstration of extensive osseous metastatic disease.      Impression    1. Persistent bilateral patchy nephrograms, consistent with contrast-induced nephropathy. No hydronephrosis.  2. No other acute process identified in the abdomen or pelvis, within limitations of the noncontrast technique.  3. Demonstration of extensive osseous metastatic disease.        The CT exam was performed using one or more of the following dose reduction techniques: Automated exposure control, adjustment of the mA and/or kV according to the patient's size, or use of iterative reconstruction technique.          Radiologist location ID:  [id number]         Reviewed: I have  reviewed all lab results.     Microbiology     BLOOD CULTURE, ROUTINE   Date Value Ref Range Status   03/17/2021 No Growth 18-24 hrs.  Preliminary   03/17/2021 No Growth 18-24 hrs.  Preliminary            Medical Decision Making     All pertinent labs were personally reviewed with additional follow-up lab work ordered as deemed clinically appropriate.  All available imaging results and EKGs were reviewed and independently interpreted.  Pertinent previous medical record results were reviewed.  Further workup and treatment depending on clinical course     Provider: Merrilee Jansky, MD Date: 03/18/2021

## 2021-03-18 NOTE — Nurses Notes (Signed)
Uneventful night. Patient appeared to be sleeping between care. Denied pain and has not passed any blood overnight. Ambulated to the bathroom with 1 assist using a walker. Fall risk precautions in place. Bed alarm is turned on. Frequent use articles and call bell within reach.

## 2021-03-18 NOTE — Care Management Notes (Signed)
New patient assignment. Lives with her husband at her son and daughter in laws house (has a downstairs apartment). Has rolling walke, cane and wheelchair to assist with ambulation. Has been taking Xarelto. Will continue to follow for discharge planning needs.

## 2021-03-18 NOTE — Care Plan (Signed)
Problem: Adult Inpatient Plan of Care  Goal: Plan of Care Review  Outcome: Ongoing (see interventions/notes)  Flowsheets (Taken 03/17/2021 2200)  Plan of Care Reviewed With: patient  Goal: Patient-Specific Goal (Individualized)  Outcome: Ongoing (see interventions/notes)  Goal: Absence of Hospital-Acquired Illness or Injury  Outcome: Ongoing (see interventions/notes)  Intervention: Identify and Manage Fall Risk  Recent Flowsheet Documentation  Taken 03/17/2021 2200 by Teena Dunk, RN  Safety Promotion/Fall Prevention:   activity supervised   fall prevention program maintained   nonskid shoes/slippers when out of bed   safety round/check completed  Intervention: Prevent and Manage VTE (Venous Thromboembolism) Risk  Recent Flowsheet Documentation  Taken 03/17/2021 2200 by Teena Dunk, RN  VTE Prevention/Management: ambulation promoted  Goal: Optimal Comfort and Wellbeing  Outcome: Ongoing (see interventions/notes)  Intervention: Provide Person-Centered Care  Recent Flowsheet Documentation  Taken 03/17/2021 2200 by Teena Dunk, [place] Relationship/Rapport:   care explained   questions answered   questions encouraged   reassurance provided   thoughts/feelings acknowledged     Problem: Pain Acute  Goal: Acceptable Pain Control and Functional Ability  Outcome: Ongoing (see interventions/notes)  Intervention: Optimize Psychosocial Wellbeing  Recent Flowsheet Documentation  Taken 03/17/2021 2200 by Teena Dunk, RN  Diversional Activities: television  Supportive Measures:   active listening utilized   positive reinforcement provided   verbalization of feelings encouraged     Problem: Fall Injury Risk  Goal: Absence of Fall and Fall-Related Injury  Outcome: Ongoing (see interventions/notes)  Intervention: Promote [place] Documentation  Taken 03/17/2021 2200 by Teena Dunk, RN  Safety Promotion/Fall Prevention:   activity supervised   fall prevention  program maintained   nonskid shoes/slippers when out of bed   safety round/check completed     Problem: Skin Injury Risk Increased  Goal: Skin Health and Integrity  Outcome: Ongoing (see interventions/notes)  Intervention: Optimize Skin Protection  Recent Flowsheet Documentation  Taken 03/17/2021 2200 by Teena Dunk, RN  Pressure Reduction Techniques: frequent weight shift encouraged  Pressure Reduction Devices: (L.M,H,VH) Pressure redistributing mattress utilized  Skin Protection:   adhesive use limited   transparent dressing maintained   tubing/devices free from skin contact     Problem: Fall Injury Risk  Goal: Absence of Fall and Fall-Related Injury  Outcome: Ongoing (see interventions/notes)  Intervention: Promote [employer] Documentation  Taken 03/17/2021 2200 by Teena Dunk, RN  Safety Promotion/Fall Prevention:   activity supervised   fall prevention program maintained   nonskid shoes/slippers when out of bed   safety round/check completed

## 2021-03-19 LAB — COMPREHENSIVE METABOLIC PANEL, NON-FASTING
ALBUMIN: 3.1 g/dL — ABNORMAL LOW (ref 3.4–4.8)
ALKALINE PHOSPHATASE: 60 U/L (ref 55–145)
ALT (SGPT): 20 U/L (ref 8–22)
ANION GAP: 8 mmol/L (ref 4–13)
AST (SGOT): 26 U/L (ref 8–45)
BILIRUBIN TOTAL: 0.4 mg/dL (ref 0.3–1.3)
BUN/CREA RATIO: 10 (ref 6–22)
BUN: 12 mg/dL (ref 8–25)
CALCIUM: 9 mg/dL (ref 8.8–10.2)
CHLORIDE: 113 mmol/L — ABNORMAL HIGH (ref 96–111)
CO2 TOTAL: 20 mmol/L — ABNORMAL LOW (ref 23–31)
CREATININE: 1.25 mg/dL — ABNORMAL HIGH (ref 0.60–1.05)
ESTIMATED GFR: 48 mL/min/BSA — ABNORMAL LOW (ref >=60)
GLUCOSE: 94 mg/dL (ref 65–125)
POTASSIUM: 3.9 mmol/L (ref 3.5–5.1)
PROTEIN TOTAL: 6.4 g/dL (ref 6.0–8.0)
SODIUM: 141 mmol/L (ref 136–145)

## 2021-03-19 LAB — CBC W/AUTO DIFF
HCT: 30 % — ABNORMAL LOW (ref 34.8–46.0)
HGB: 9.9 g/dL — ABNORMAL LOW (ref 11.5–16.0)
MCH: 32.1 pg — ABNORMAL HIGH (ref 26.0–32.0)
MCHC: 33 g/dL (ref 31.0–35.5)
MCV: 97.4 fL (ref 78.0–100.0)
MPV: 10.3 fL (ref 8.7–12.5)
PLATELETS: 134 10*3/uL — ABNORMAL LOW (ref 150–400)
RBC: 3.08 10*6/uL — ABNORMAL LOW (ref 3.85–5.22)
RDW-CV: 16.4 % — ABNORMAL HIGH (ref 11.5–15.5)
WBC: 4.1 10*3/uL (ref 3.7–11.0)

## 2021-03-19 LAB — MANUAL DIFF AND MORPHOLOGY-SYSMEX
BASOPHIL #: <0.1 10*3/uL (ref <=0.20)
BASOPHIL %: 0 %
EOSINOPHIL #: <0.1 10*3/uL (ref <=0.50)
EOSINOPHIL %: 1 %
LYMPHOCYTE #: 0.53 10*3/uL — ABNORMAL LOW (ref 1.00–4.80)
LYMPHOCYTE %: 12 %
MONOCYTE #: 1.11 10*3/uL — ABNORMAL HIGH (ref 0.20–1.10)
MONOCYTE %: 27 %
MYELOCYTE %: 2 %
NEUTROPHIL #: 2.34 10*3/uL (ref 1.50–7.70)
NEUTROPHIL %: 57 %
RBC MORPHOLOGY: NORMAL
REACTIVE LYMPHOCYTE %: 1 %

## 2021-03-19 LAB — MAGNESIUM: MAGNESIUM: 1.7 mg/dL — ABNORMAL LOW (ref 1.8–2.6)

## 2021-03-19 LAB — PHOSPHORUS: PHOSPHORUS: 3.4 mg/dL (ref 2.3–4.0)

## 2021-03-19 MED ORDER — LEVOFLOXACIN 750 MG TABLET
750.0000 mg | ORAL_TABLET | ORAL | 0 refills | Status: AC
Start: 2021-03-19 — End: 2021-03-25

## 2021-03-19 MED ORDER — LOPERAMIDE 2 MG CAPSULE
2.0000 mg | ORAL_CAPSULE | ORAL | Status: DC | PRN
Start: 2021-03-19 — End: 2021-03-19

## 2021-03-19 MED ORDER — LACTOBACILLUS ACIDOPHILUS 75 MILLION CELL-PECTIN 100 MG CAPSULE
1.0000 | ORAL_CAPSULE | Freq: Every day | ORAL | 0 refills | Status: AC
Start: 2021-03-19 — End: 2021-04-25

## 2021-03-19 NOTE — Discharge Summary (Addendum)
[facility]  Internal Medicine  Discharge Summary     Name: Gina Barrett, Gina Barrett Date of Admission:  03/16/2021   Date of Birth:  1955/10/23 Date of Discharge:  03/19/2021   PCP: Lanier Ensign, FNP-C Attending: No att. providers found   MRN: [id number] Code Status: Full Code     Discharge Diagnosis       Active Hospital Problems    Diagnosis Date Noted   . Principle Problem: Hemoptysis [R04.2] 03/16/2021   . Anemia [D64.9] 01/31/2021   . Drug-induced pancytopenia (CMS HCC) [U63.335] 02/02/2017   . Malignant neoplasm of lung, unspecified laterality, unspecified part of lung (CMS HCC) [C34.90] 12/14/2016      Resolved Hospital Problems   No resolved problems to display.       Allergies: She is allergic to penicillins.       Hospital Course     Chief complaint: Hemoptysis    Summary: Gina Barrett is a 65 y.o. year old female with PMH of  DVT on Xarelto, metastatic lung cancer who presented with hemoptysis and admitted for severe anemia and hemoptysis    Patient was transfused 2UPRBC for initial Hb that was low. Repeat Hb was much higher than expected. Likely lab error. No other episodes of hemoptysis challenged with lovenox and she tolerated. Decision to go back on Xarelto instead. Patient underwent CT Chest which was negative for PE. Patient had multiple febrile episodes, blood cultures collected nad was negative. Patient is now afebrile for 24 hours. GI panel negative. CT AP was negative for underlying infections.    Patient will be discharged home. She was given levaquin for 3 days renally dosed due to febrile episodes.     Physical Exam     BP 125/71   Pulse 87   Temp 36.3 C (97.4 F)   Resp 18   Ht 1.549 m (5\' 1" )   Wt 49 kg (108 lb)   SpO2 99%   BMI 20.41 kg/m         Physical Exam  Vitals and nursing note reviewed.   HENT:      Head: Normocephalic and atraumatic.      Mouth/Throat:      Mouth: Mucous membranes are moist.   Eyes:      General: No scleral icterus.     Conjunctiva/sclera:  Conjunctivae normal.   Cardiovascular:      Rate and Rhythm: Normal rate and regular rhythm.      Heart sounds: No murmur heard.  Pulmonary:      Effort: Pulmonary effort is normal. No respiratory distress.      Breath sounds: No wheezing.      Comments: Decreased breath sounds bilaterally  Abdominal:      General: Bowel sounds are normal. There is no distension.      Palpations: Abdomen is soft.      Tenderness: There is no abdominal tenderness.   Musculoskeletal:         General: No swelling.   Skin:     General: Skin is warm.      Capillary Refill: Capillary refill takes less than 2 seconds.      Coloration: Skin is not jaundiced.   Neurological:      Mental Status: She is alert and oriented to person, place, and time. Mental status is at baseline.   Psychiatric:         Mood and Affect: Mood normal.  Discharge Medications        Current Discharge Medication List      START taking these medications.      Details   lactobacillus acidoph-pectin 75 million cell -100 mg Capsule  Commonly known as: ACIDOPHILUS-PECTIN   1 Capsule, Oral, DAILY  Qty: 7 Capsule  Refills: 0     levoFLOXacin 750 mg Tablet  Commonly known as: LEVAQUIN   750 mg, Oral, EVERY OTHER DAY  Qty: 3 Tablet  Refills: 0        CONTINUE these medications - NO CHANGES were made during your visit.      Details   cholecalciferol (vitamin D3) 25 mcg (1,000 unit) Tablet   2,000 Units, Oral, DAILY  Qty: 90 Tablet  Refills: 7     clobetasoL-emollient no.65 0.05 % Combo Pack   Apply externally, 2 TIMES DAILY, To buttock  Refills: 0     clotrimazole 1 % Cream  Commonly known as: LOTRIMIN   Topical  Refills: 0     Desoximetasone 0.25 % Cream  Commonly known as: TOPICORT   Topical, 2 TIMES DAILY, To Ear  Refills: 0     levothyroxine 88 mcg Tablet  Commonly known as: SYNTHROID   TAKE ONE TABLET BY MOUTH EVERY DAY  Refills: 0     loratadine 10 mg Tablet  Commonly known as: CLARITIN   10 mg, DAILY  Refills: 0     megestroL 40 mg Tablet  Commonly known as:  MEGACE   40 mg, Oral  Refills: 0     mirtazapine 15 mg Tablet  Commonly known as: REMERON   TAKE ONE TABLET BY MOUTH EVERY EVENING  Qty: 30 Tablet  Refills: 1     MULTIVITAMIN ORAL   Oral, DAILY, Centrum Silver Women's 50+   Refills: 0     oxyCODONE-acetaminophen 10-325 mg Tablet  Commonly known as: PERCOCET   1 Tablet, Oral, EVERY 6 HOURS PRN  Qty: 15 Tablet  Refills: 0     polyethylene glycol 17 gram Powder in Packet  Commonly known as: MIRALAX   17 g, Oral, DAILY PRN  Qty: 10 Packet  Refills: 0     rivaroxaban 15 mg (42)- 20 mg (9) Tablets, Dose Pack  Commonly known as: XARELTO  Notes to patient: Take the remainder of Xarelto 15mg  twice daily (Start with evening dose today) then after 15mg  pills run out, start taking 20mg  once every evening.   Take as instructed.  Qty: 1 Each  Refills: 0     Senna Plus 8.6-50 mg Tablet  Generic drug: sennosides-docusate sodium   TAKE ONE TABLET BY MOUTH EVERY EVENING  Qty: 90 Tablet  Refills: 1     UNKNOWN MEDICATION  Commonly known as: UNKNOWN MEDICATION   Ophthalmic, [name] 128 Eye Drops OTC 4x daily [name] 128 Eye Gel OTC Nightly   Refills: 0             Labs     Recent Labs     03/17/21  0459 03/18/21  0416 03/19/21  0543   HGB 9.6* 9.9* 9.9*   WBC 2.9* 3.1* 4.1   PLTCNT 89* 118* 134*       Recent Labs     03/17/21  0459 03/18/21  0416 03/19/21  0543   SODIUM 139 139 141   POTASSIUM 3.7 4.1 3.9   MAGNESIUM 1.6* 1.9 1.7*   CALCIUM 8.8 9.0 9.0   CREATININE 1.10* 1.03 1.25*   BUN 21 15 12  CO2 20* 19* 20*       Reviewed: I have reviewed all lab results.     Imaging studies          Reviewed: I have reviewed all lab results.     Miscellaneous     Activity: As tolerated  Diet: Regular  Condition on discharge: Alert, Oriented and VS Stable  Disposition: Home discharge      Follow Up     PCP: Lanier Ensign, FNP-C  [place] [id number]     Follow-up Information     Lanier Ensign, FNP-C. Schedule an appointment as soon as possible for a visit in 1  week.    Specialty: NURSE PRACTITIONER  Why: Wednesday 03/27/21 at 2:40pm  Contact information:  [address]  [place] [address]  [phone]                          Provider: Merrilee Jansky, MD Time spent on discharge 35 minutes Date: 03/19/2021

## 2021-03-19 NOTE — Ancillary Notes (Signed)
[facility]  Medical Nutrition Therapy Screen Note                                    Date of Service: 03/19/2021    Reason for Note: Nutrition Screen Follow-Up    Current Diet Order/Nutrition Support:  DIET REGULAR Supplement: Ensure Plus High Protein; BID    Reviewed patient status, diet order/TF/TPN, labs and medications.  Height: 154.9 cm (5\' 1" )   Weight: 49 kg (108 lb) (03/18/21 0346)   Body mass index is 20.41 kg/m.    Brief Subjective:   Admitted d/t hemoptysis; hx metastatic lung cancer on chemo/radiation.   Diet continues as Regular with Ensure Plus High Protein BID. Meal intakes at ~40% average x 3 days since admission.   Per MD note, GI panel ordered yesterday r/t large bowel movement and episode of emesis; possibility for post obstructive PNA. S/p two units PRBC 10/1 per chart with improved H&H.  Braden score= 19; nutrition= 3, adequate.   Wt is up 3# since admission.   Significant medications: vitamin D3, levothyroxine, remeron, senokot   Significant lab values: Creatinine- 1.25 (H), GFR- 48      Most Recent Screen Previous screen   Impaired Nutrition Status Score Impaired Nutrition Status Score: 0 - Normal nutritional status (03/19/21 0800)   Impaired Nutrition Status Score: 0 - Normal nutritional status (03/17/21 0800)           Severity of Disease Score Severity of Disease Score: 1 - Chronic patients in particular with acute complications: cirrhosis, COPD, dialysis, oncology, recent bariatric surgery - Mild (03/19/21 0800)   Severity of Disease Score: 1 - Chronic patients in particular with acute complications: cirrhosis, COPD, dialysis, oncology, recent bariatric surgery - Mild (03/17/21 0800)       Age Age: 66 - < 70 years (03/19/21 0800) Age: 66 - < 70 years (03/17/21 0800)     Score Score: 1 (03/19/21 0800)     Score: 1 (03/17/21 0800)   Risk Level Risk Level: Level 1 - 0 to 1 pt - No initial note required (03/19/21 0800)   Risk Level: Level 1 - 0  to 1 pt - No initial note required (03/17/21 0800)         Nutrition related problems: Underweight status r/t hx of metastatic lung cancer receiving treatment aeb BMI of 20.78 with 25% PO meal completions so far- IN PROGRESS- wt is up 3# since admission, intakes at ~40% average x 3 days so far    Assessment: Needs assessed/reassessed 4-7 days     Monitor: Ongoing PO Intakes, Diet Tolerance     Plan/Intervention:    Continue Regular (least restrictive) diet as ordered.    Continue Ensure Plus High Protein BID- providing 700 kcals, 40 gm protein daily.    Provide between meal snacks as desired and feeding encouragement with all meals.    Will continue to monitor per screening protocol.     Rhona Raider, RDLD   Ext: [phone]

## 2021-03-19 NOTE — Nurses Notes (Signed)
Patient discharged home with family.  AVS reviewed with patient/care giver.  A written copy of the AVS and discharge instructions was given to the patient/care giver.  Questions sufficiently answered as needed.  Patient/care giver encouraged to follow up with PCP as indicated.  In the event of an emergency, patient/care giver instructed to call 911 or go to the nearest emergency room.

## 2021-03-19 NOTE — Care Management Notes (Signed)
03/19/21 1000   Assessment Detail   Date of Care Management Update 03/19/21   Medicare Intent to Discharge Documentation   Discharge IMM give to: Patient   Discharge IMM Letter Given Date 03/19/21   Discharge IMM Letter Given Time 1012   IMM explained/reviewed with:  Patient     I have reviewed the Medicare IM  with the patient and she verbalized understanding.  I provided the number for [facility] @ [phone].  Pt given a copy, and original given to TIA ([employer]) to scan into epic.

## 2021-03-20 ENCOUNTER — Ambulatory Visit: Payer: Medicare Other | Attending: Hematology & Oncology | Admitting: Hematology & Oncology

## 2021-03-20 ENCOUNTER — Encounter (HOSPITAL_COMMUNITY): Payer: Self-pay | Admitting: Hematology & Oncology

## 2021-03-20 ENCOUNTER — Other Ambulatory Visit: Payer: Self-pay

## 2021-03-20 VITALS — BP 123/64 | HR 108 | Temp 98.0°F | Resp 28 | Ht 60.98 in | Wt 102.2 lb

## 2021-03-20 DIAGNOSIS — Z7901 Long term (current) use of anticoagulants: Secondary | ICD-10-CM | POA: Insufficient documentation

## 2021-03-20 DIAGNOSIS — C7951 Secondary malignant neoplasm of bone: Secondary | ICD-10-CM

## 2021-03-20 DIAGNOSIS — R269 Unspecified abnormalities of gait and mobility: Secondary | ICD-10-CM

## 2021-03-20 DIAGNOSIS — C801 Malignant (primary) neoplasm, unspecified: Secondary | ICD-10-CM

## 2021-03-20 DIAGNOSIS — Z86718 Personal history of other venous thrombosis and embolism: Secondary | ICD-10-CM | POA: Insufficient documentation

## 2021-03-20 DIAGNOSIS — Z9221 Personal history of antineoplastic chemotherapy: Secondary | ICD-10-CM | POA: Insufficient documentation

## 2021-03-20 DIAGNOSIS — I82412 Acute embolism and thrombosis of left femoral vein: Secondary | ICD-10-CM

## 2021-03-20 DIAGNOSIS — C3491 Malignant neoplasm of unspecified part of right bronchus or lung: Secondary | ICD-10-CM | POA: Insufficient documentation

## 2021-03-20 DIAGNOSIS — R5381 Other malaise: Secondary | ICD-10-CM

## 2021-03-20 NOTE — Progress Notes (Signed)
Return Patient Progress Note    Date: 03/20/2021    Name: Gina Barrett  MRN: [id number]  DOB: 1955-10-24   Referring Physician: Self, Referral  Primary Care Provider: Lanier Ensign, FNP-C    Reason for visit/consultation Lung Cancer (Non-small cell lung cancer, right )      Interval History:   65 y/o woman with stage IV lung cancer on palliative chemo who returns to clinic for f/u after recent hospitalization.  -2nd hospital stay in less than a month and this time for scant hemoptysis and low grade fever after one dose Xarelto following recent diagnosis of left LE DVT.   -D /C home on levofloxacin 750 QOD.  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  -PET-CT scan on 09/06/2016 showed 10 mm right upper lobe FDG avid lesion that is slightly larger than 2017 CT and concerning for malignancy.  A separate 3 cm long area of tracer uptake in the left lower lobe was noted.  -CT-guided biopsy a [facility] on 10/02/2016 was positive for adenocarcinoma that is well to moderately differentiated.  -Mediastinoscopy 11/13/2016 positive for left mid paratrachael    Stage: IV, initially diagnosed as IIIB     Molecular/special studies:  ROS-1, ALK-1, EGFR, PDL1 all negative    Treatment & Surveillance:   1. S/p chemoradiation with carboplatin + paclitaxel + RT 66.6 Gy completed 02/10/19/18    2. Serial imaging since completion of therapy has been negative for recurrence.  Most recent imaging in on 12/03/2018 was also negative but showed a new T6  compression abnormality.     3. Chest CT with contrast from 09/23/2019 which in summary showed increasing pleural based density in the LLL which may be infectious or inflammatory.  There is increased 1.7cm LN in left hilum ans stable compression abnormality of T6.    4. Biopsy of left LL pleural bases mass on 11/02/2019 showed benign fibrovascular tissue with focal chronic inflammation.  no lung parechyma or malgnancy identified.    5. Patient developed progressive musculoskeletal pain around October /November of 2021 evaluation with multiple imaging studies highly suggest metastatic disease.  Evaluation for plasma cell was noncontributory.  Patient was referred to orthopedic surgery (Dr. Hervey Ard, by family request and known to patient)  for surgical recommendation especially regarding weight-bearing.    6. Biopsy of paraspinal soft tissue mass on 07/04/2020 confirmed metastatic carcinoma consistent with lung primary. CARIS NGS negative    7.Radiation Oncology for radiation to the acetabulum as well as the paraspinal mass started 07/19/2020 x 10 , ending 07/31/2020    8. Mediport placed  07/19/2020    9.Begin Zoledronic acid.  Has dentures and no dental eval needed    10. Brain MRI planne2/12/2020     11.  Begin palliative carboplatin & pemetrexed on 08/03/2020       ________________________________________________________________________________________________________________________________________________     Objective   Objective:   BP 123/64 (Site: Left, Patient Position: Sitting)   Pulse (!) 108   Temp 36.7 C (98 F) (Thermal Scan)   Resp (!) 28   Ht 1.549 m (5' 0.98") Comment: *  Wt 46.4 kg (102 lb 3.2 oz)   SpO2 100%   BMI 19.32 kg/m       ECOG Status: 3 - Capable of only limited selfcare, confined to bed or chair more than 50% of waking hour.  Physical Exam  Cardiovascular:      Rate and Rhythm: Regular rhythm.      Heart sounds: Normal heart sounds.   Pulmonary:      Breath sounds: Normal breath sounds.    Abdominal:      Palpations: Abdomen is soft.      Tenderness: There is no abdominal tenderness.   Musculoskeletal:      Right lower leg: Edema present.      Left lower leg: Edema present.   Skin:     Coloration: Skin is not jaundiced.          Past Medical History:   Diagnosis Date   . Abnormal Pap smear     ASCUS, ASCUS cannot rule out HGSIL   . Cancer (CMS HCC)     non small cell lung cancer (R)-s/p Chemo/Radiation completed 8/18   . Colon polyp    . Dysphagia    . Hypothyroid    . Non-small cell lung cancer, right (CMS [place])     s/p Chemo completed 9/18   . Raynaud's disease    . STD (sexually transmitted disease)     HSV   . Unspecified disorder of thyroid     Hypothyroidism         Current Outpatient Medications   Medication Sig   . cholecalciferol, vitamin D3, 25 mcg (1,000 unit) Oral Tablet Take 2 Tablets (2,000 Units total) by mouth Once a day   . clobetasoL-emollient no.65 0.05 % Apply externally Combo Pack Apply topically Twice daily To buttock   . clotrimazole (LOTRIMIN) 1 % Cream Apply topically   . Desoximetasone (TOPICORT) 0.25 % Cream Apply topically Twice daily To Ear   . levothyroxine (SYNTHROID) 88 mcg Oral Tablet TAKE ONE TABLET BY MOUTH EVERY DAY   . loratadine (CLARITIN) 10 mg Oral Tablet take 10 mg by mouth Once a day.   . megestroL (MEGACE) 40 mg Oral Tablet Take 40 mg by mouth   . mirtazapine (REMERON) 15 mg Oral Tablet TAKE ONE TABLET BY MOUTH EVERY EVENING   . multivit-min/iron/folic/lutein (CENTRUM SILVER WOMEN ORAL) Take by mouth   . MULTIVITAMIN ORAL Take by mouth Once a day Centrum Silver Women's 50+   . rivaroxaban (XARELTO) 15 mg (42)- 20 mg (9) Oral Tablets, Dose Pack Take as instructed.   . SENNA PLUS 8.6-50 mg Oral Tablet TAKE ONE TABLET BY MOUTH EVERY EVENING   . UNKNOWN MEDICATION (UNKNOWN MEDICATION) Administer into affected eye(s) [name] 128 Eye Drops OTC 4x daily  [name] 128 Eye Gel OTC Nightly     Allergies as of 03/20/2021 - Reviewed 03/20/2021   Allergen Reaction Noted   .  Penicillins  08/15/2010       Social History  Occupation:   Social History     Occupational History     Employer: [employer] [place] [id number]   reports that she quit smoking about 22 years ago. Her smoking use included cigarettes. She started smoking about 50 years ago. She has never used smokeless tobacco. She reports being sexually active and has had partner(s) who are female. She reports using the following method of birth control/protection: None. She reports that she does not drink alcohol and does  not use drugs.    Labs:  Labs reviewed and interpreted:     Latest Reference Range & Units 03/19/21 05:43   WBC 3.7 - 11.0 x10^3/uL 4.1   HGB 11.5 - 16.0 g/dL 9.9 (L)   HCT 34.8 - 46.0 % 30.0 (L)   PLATELET COUNT 150 - 400 x10^3/uL 134 (L)   RBC 3.85 - 5.22 x10^6/uL 3.08 (L)   MCV 78.0 - 100.0 fL 97.4   MCHC 31.0 - 35.5 g/dL 33.0   MCH 26.0 - 32.0 pg 32.1 (H)   RDW-CV 11.5 - 15.5 % 16.4 (H)   MPV 8.7 - 12.5 fL 10.3   PMN'S % 57   LYMPHOCYTES % 12   EOSINOPHIL % 1   MONOCYTES % 27   BASOPHILS % 0   MYELOCYTES % 2   REACTIVE LYMPHOCYTE % % 1   PMN ABS 1.50 - 7.70 x10^3/uL 2.34   LYMPHS ABS 1.00 - 4.80 x10^3/uL 0.53 (L)   EOS ABS <=0.50 x10^3/uL <0.10   MONOS ABS 0.20 - 1.10 x10^3/uL 1.11 (H)   BASOS ABS <=0.20 x10^3/uL <0.10   RBC MORPHOLOGY  Normal RBC and PLT Morphology   SODIUM 136 - 145 mmol/L 141   POTASSIUM 3.5 - 5.1 mmol/L 3.9   CHLORIDE 96 - 111 mmol/L 113 (H)   CARBON DIOXIDE 23 - 31 mmol/L 20 (L)   BUN 8 - 25 mg/dL 12   CREATININE 0.60 - 1.05 mg/dL 1.25 (H)   GLUCOSE 65 - 125 mg/dL 94   ANION GAP 4 - 13 mmol/L 8   BUN/CREAT RATIO 6 - 22  10   ESTIMATED GLOMERULAR FILTRATION RATE >=60 mL/min/BSA 48 (L)   CALCIUM 8.8 - 10.2 mg/dL 9.0   MAGNESIUM 1.8 - 2.6 mg/dL 1.7 (L)   PHOSPHORUS 2.3 - 4.0 mg/dL 3.4   TOTAL PROTEIN 6.0 - 8.0 g/dL 6.4   ALBUMIN 3.4 - 4.8 g/dL  3.1 (L)   BILIRUBIN, TOTAL 0.3 - 1.3 mg/dL 0.4   AST (SGOT) 8 - 45 U/L 26   ALT (SGPT) 8 - 22 U/L 20   ALKALINE PHOSPHATASE 55 -  145 U/L 60   (L): Data is abnormally low  (H): Data is abnormally high    Radiology: All recent pertinent radiologic images and reports reviewed          Assessment/Plan:     1. Non-small cell lung cancer, right (CMS [place])  - 66 y/o with mets NSCLC with pathologic complication.  Currently on palliative chemo with stable disease radiographically but has not made any significant material improvement in functionality.  Recent DVT is in part due to  immobility and recent hospitalizations her further lead to deconditioning.  She has not radiate this time to resume chemotherapy which will be deferred for about a week.  Meantime we will resume physical therapy again with precaution not to exacerbate other areas of pathologic fractures.  We will need to discuss hospice if she continues to end up in hospital with subsequent chemotherapies in the future.  For  - Refer to [place]; Future    2. Bone metastasis (CMS HCC)  -continue zoledronic acid and analgesics for pain.    3. Physical deconditioning  - Refer to [place]; Future    4. Functional gait disorder  - Refer to [place]; Future    5. Left femoral vein DVT (CMS HCC)  -continue Xarelto  -compression stockings if she can  Return in about 4 weeks (around 04/17/2021).    Mertie Moores, MD        CC:  PCP General:  Lanier Ensign, FNP-C  [id number]  [place] [id number]      Portions of this note may be dictated using voice recognition software or a dictation service. Variances in spelling and vocabulary are possible and unintentional. Not all errors are caught/corrected. Please notify the [facility] if any discrepancies are noted or if the meaning of any statement is not clear.

## 2021-03-21 ENCOUNTER — Inpatient Hospital Stay (HOSPITAL_COMMUNITY): Admission: RE | Admit: 2021-03-21 | Payer: Medicare Other | Source: Ambulatory Visit

## 2021-03-21 ENCOUNTER — Encounter (HOSPITAL_COMMUNITY): Payer: Self-pay | Admitting: Internal Medicine

## 2021-03-22 LAB — ADULT ROUTINE BLOOD CULTURE, SET OF 2 BOTTLES (BACTERIA AND YEAST)
BLOOD CULTURE, ROUTINE: NO GROWTH
BLOOD CULTURE, ROUTINE: NO GROWTH

## 2021-03-27 ENCOUNTER — Encounter (INDEPENDENT_AMBULATORY_CARE_PROVIDER_SITE_OTHER): Payer: Self-pay | Admitting: Family

## 2021-03-27 ENCOUNTER — Ambulatory Visit (INDEPENDENT_AMBULATORY_CARE_PROVIDER_SITE_OTHER): Payer: Medicare Other | Admitting: Family

## 2021-03-27 VITALS — BP 122/60 | HR 92 | Temp 98.9°F | Resp 18 | Ht 61.0 in | Wt 104.0 lb

## 2021-03-27 DIAGNOSIS — Z23 Encounter for immunization: Secondary | ICD-10-CM

## 2021-03-27 DIAGNOSIS — R042 Hemoptysis: Secondary | ICD-10-CM

## 2021-03-27 DIAGNOSIS — C3491 Malignant neoplasm of unspecified part of right bronchus or lung: Secondary | ICD-10-CM

## 2021-03-27 DIAGNOSIS — C7951 Secondary malignant neoplasm of bone: Secondary | ICD-10-CM

## 2021-03-27 NOTE — Progress Notes (Signed)
Subjective:    Patient ID: Melody Hendricks is a 65 y.o. female.    Patient is here for a hospital follow-up.  Melody Hendricks is a 65 y.o. year old female with PMH of DVT on Xarelto, metastatic lung cancer who presented with hemoptysis and admitted for severe anemia and hemoptysis to [facility] from 03/16/2021 to 03/19/2021.    Patient was transfused 2UPRBC for initial Hb that was low. Repeat Hb was much higher than expected. Likely lab error. No other episodes of hemoptysis challenged with lovenox and she tolerated. She was placed back on Xarelto . Patient underwent CT Chest which was negative for PE. Patient had multiple febrile episodes, blood cultures collected nad was negative. Patient was afebrile for 24 hours prior to her discharge. GI panel negative. CT AP was negative for underlying infections.    Patient was discharged home with levaquin for 3 days renally dosed due to febrile episodes.    She continues to see Dr. Charlette Caffey, oncology for history of lung cancer with bone metastasis.    Treatment & Surveillance as follows per oncology notes:   1. S/p chemoradiation with carboplatin + paclitaxel + RT 66.6 Gy completed 02/10/19/18  2. Serial imaging since completion of therapy has been negative for recurrence. Most recent imaging in on 12/03/2018 was also negative but showed a new T6 compression abnormality.   3. Chest CT with contrast from 09/23/2019 which in summary showed increasing pleural based density in the LLL which may be infectious or inflammatory. There is increased 1.7cm LN in left hilum ans stable compression abnormality of T6.  4. Biopsy of left LL pleural bases mass on 11/02/2019 showed benign fibrovascular tissue with focal chronic inflammation. no lung parechyma or malgnancy identified.  5. Patient developed progressive musculoskeletal pain around October Scarlette [place] of 2021 evaluation with multiple imaging studies highly suggest metastatic disease. Evaluation for plasma cell was  noncontributory. Patient was referred to orthopedic surgery (Dr. Kristeen Mans, by family request and known to patient) for surgical recommendation especially regarding weight-bearing.  6. Biopsy of paraspinal soft tissue mass on 07/04/2020 confirmed metastatic carcinoma consistent with lung primary. CARIS NGS negative  7.Radiation Oncology for radiation to the acetabulum as well as the paraspinal mass started 07/19/2020 x 10 , ending 07/31/2020  8. Mediport placed 07/19/2020  9.Begin Zoledronic acid. Has dentures and no dental eval needed  10. Brain MRI planne2/12/2020   11. Begin palliative carboplatin & pemetrexed on 08/03/2020       She is currently living with her son and daughter in law and husband.  Patient no longer has home health coming into the home.  She states that palliative care does check on her every 1 to 2 months.    Her husband accompanies her today to the appointment.    She is not currently having any problems or concerns.  She states that her appetite has returned.  She is starting to regain her weight.  She has been a febrile and has not had any hemoptysis.            The following portions of the patient's history were reviewed and updated as appropriate: allergies, current medications, past family history, past medical history, past social history, past surgical history, and problem list.    Review of Systems   Constitutional:  Negative for activity change, appetite change, chills, fatigue and fever.   HENT:  Negative for sinus pressure and sinus pain.    Eyes:  Negative for visual disturbance.  Respiratory:  Negative for chest tightness and shortness of breath.    Cardiovascular:  Negative for chest pain.   Gastrointestinal:  Negative for abdominal pain, constipation, diarrhea, nausea and vomiting.   Endocrine: Negative for polydipsia and polyphagia.   Genitourinary:  Negative for difficulty urinating.   Musculoskeletal:  Negative for arthralgias.   Skin:  Negative for rash.   Neurological:  Negative  for headaches.   Psychiatric/Behavioral:  Negative for suicidal ideas.    All other systems reviewed and are negative.        Objective:    Physical Exam  Vitals reviewed.   Constitutional:       Comments: Frail    HENT:      Head: Normocephalic and atraumatic.      Right Ear: Tympanic membrane, ear canal and external ear normal.      Left Ear: Tympanic membrane, ear canal and external ear normal.      Nose: Nose normal.      Mouth/Throat:      Mouth: Mucous membranes are moist.      Pharynx: Oropharynx is clear.      Comments: dentures  Eyes:      Extraocular Movements: Extraocular movements intact.      Conjunctiva/sclera: Conjunctivae normal.      Pupils: Pupils are equal, round, and reactive to light.   Cardiovascular:      Rate and Rhythm: Normal rate and regular rhythm.      Pulses: Normal pulses.      Heart sounds: Normal heart sounds.   Pulmonary:      Effort: Pulmonary effort is normal. No respiratory distress.      Breath sounds: Normal breath sounds.   Abdominal:      General: Bowel sounds are normal.      Palpations: Abdomen is soft.   Musculoskeletal:      Cervical back: Normal range of motion and neck supple.   Skin:     General: Skin is warm.      Capillary Refill: Capillary refill takes less than 2 seconds.   Neurological:      General: No focal deficit present.      Mental Status: She is alert and oriented to person, place, and time.      Gait: Gait abnormal (weakness, wheelchair bound. Uses walker @ home).   Psychiatric:         Attention and Perception: Attention normal.         Mood and Affect: Mood normal.         Speech: Speech normal.         Behavior: Behavior normal.         Thought Content: Thought content normal.         Cognition and Memory: Cognition normal.         Judgment: Judgment normal.           Assessment:       1. Hemoptysis    2. Non-small cell lung cancer, right    3. Bone metastasis    4. Immunization due          Plan:       Resolved. CBC improved @ discharge. Will have labs  rechecked per oncology. Has an appointment tomorrow.  [facility] hospital record dated 10/1 through 10 4 was reviewed by me and discussed with patient and her husband.  Continue treatment per Dr. Charlette Caffey  Continue Palliative care in the home. She has good support  High dose flu vaccine given today     Follow up in 6 months and prn     Orders Placed This Encounter    Flu vaccine HIGH-DOSE QUAD (PF) 65 yrs and older    desoximetasone (TOPICORT) 0.25 % cream    cholecalciferol (vitamin D3) 25 MCG (1000 UT) tablet    Clobetasol Propionate Emulsion 0.05 % topical foam    clotrimazole (LOTRIMIN) 1 % cream    rivaroxaban (XARELTO) 15 & 20 MG tablet starter pack

## 2021-03-28 ENCOUNTER — Encounter (INDEPENDENT_AMBULATORY_CARE_PROVIDER_SITE_OTHER): Payer: Self-pay

## 2021-03-28 ENCOUNTER — Inpatient Hospital Stay (HOSPITAL_COMMUNITY)
Admission: RE | Admit: 2021-03-28 | Discharge: 2021-03-28 | Disposition: A | Discharge status: Still In Hospital | Payer: Medicare Other | Source: Ambulatory Visit | Attending: Hematology & Oncology | Admitting: Hematology & Oncology

## 2021-03-28 ENCOUNTER — Other Ambulatory Visit: Payer: Self-pay

## 2021-03-28 ENCOUNTER — Encounter (HOSPITAL_COMMUNITY): Payer: Self-pay

## 2021-03-28 VITALS — BP 133/64 | HR 104 | Temp 98.9°F | Resp 18 | Ht 60.98 in | Wt 104.6 lb

## 2021-03-28 DIAGNOSIS — C3491 Malignant neoplasm of unspecified part of right bronchus or lung: Secondary | ICD-10-CM

## 2021-03-28 DIAGNOSIS — D649 Anemia, unspecified: Secondary | ICD-10-CM

## 2021-03-28 DIAGNOSIS — Z95828 Presence of other vascular implants and grafts: Secondary | ICD-10-CM

## 2021-03-28 DIAGNOSIS — C3490 Malignant neoplasm of unspecified part of unspecified bronchus or lung: Secondary | ICD-10-CM

## 2021-03-28 DIAGNOSIS — C7951 Secondary malignant neoplasm of bone: Secondary | ICD-10-CM

## 2021-03-28 DIAGNOSIS — E876 Hypokalemia: Secondary | ICD-10-CM

## 2021-03-28 LAB — MANUAL DIFF AND MORPHOLOGY-SYSMEX
BASOPHIL #: <0.1 10*3/uL (ref <=0.20)
BASOPHIL %: 0 %
EOSINOPHIL #: <0.1 10*3/uL (ref <=0.50)
EOSINOPHIL %: 0 %
LYMPHOCYTE #: 0.47 10*3/uL — ABNORMAL LOW (ref 1.00–4.80)
LYMPHOCYTE %: 12 %
METAMYELOCYTE %: 5 %
MONOCYTE #: 0.9 10*3/uL (ref 0.20–1.10)
MONOCYTE %: 23 %
MYELOCYTE %: 2 %
NEUTROPHIL #: 2.26 10*3/uL (ref 1.50–7.70)
NEUTROPHIL %: 51 %
NEUTROPHIL BANDS %: 7 %
RBC MORPHOLOGY: NORMAL

## 2021-03-28 LAB — CBC W/AUTO DIFF
HCT: 28.4 % — ABNORMAL LOW (ref 34.8–46.0)
HGB: 9.6 g/dL — ABNORMAL LOW (ref 11.5–16.0)
MCH: 33.4 pg — ABNORMAL HIGH (ref 26.0–32.0)
MCHC: 33.8 g/dL (ref 31.0–35.5)
MCV: 99 fL (ref 78.0–100.0)
MPV: 9.9 fL (ref 8.7–12.5)
PLATELETS: 259 10*3/uL (ref 150–400)
RBC: 2.87 10*6/uL — ABNORMAL LOW (ref 3.85–5.22)
RDW-CV: 15.8 % — ABNORMAL HIGH (ref 11.5–15.5)
WBC: 3.9 10*3/uL (ref 3.7–11.0)

## 2021-03-28 LAB — COMPREHENSIVE METABOLIC PANEL, NON-FASTING
ALBUMIN: 3 g/dL — ABNORMAL LOW (ref 3.4–4.8)
ALKALINE PHOSPHATASE: 71 U/L (ref 55–145)
ALT (SGPT): 16 U/L (ref 8–22)
ANION GAP: 10 mmol/L (ref 4–13)
AST (SGOT): 27 U/L (ref 8–45)
BILIRUBIN TOTAL: 0.2 mg/dL — ABNORMAL LOW (ref 0.3–1.3)
BUN/CREA RATIO: 13 (ref 6–22)
BUN: 16 mg/dL (ref 8–25)
CALCIUM: 9.5 mg/dL (ref 8.8–10.2)
CHLORIDE: 106 mmol/L (ref 96–111)
CO2 TOTAL: 23 mmol/L (ref 23–31)
CREATININE: 1.26 mg/dL — ABNORMAL HIGH (ref 0.60–1.05)
ESTIMATED GFR: 47 mL/min/BSA — ABNORMAL LOW (ref >=60)
GLUCOSE: 105 mg/dL (ref 65–125)
POTASSIUM: 3.7 mmol/L (ref 3.5–5.1)
PROTEIN TOTAL: 6.6 g/dL (ref 6.0–8.0)
SODIUM: 139 mmol/L (ref 136–145)

## 2021-03-28 LAB — MAGNESIUM: MAGNESIUM: 1.5 mg/dL — ABNORMAL LOW (ref 1.8–2.6)

## 2021-03-28 MED ORDER — POTASSIUM CHLORIDE ER 20 MEQ TABLET,EXTENDED RELEASE(PART/CRYST)
20.0000 meq | ORAL_TABLET | Freq: Once | ORAL | Status: AC
Start: 2021-03-28 — End: 2021-03-28
  Administered 2021-03-28: 14:00:00 20 meq via ORAL
  Filled 2021-03-28: qty 1

## 2021-03-28 MED ORDER — SODIUM CHLORIDE 0.9 % INTRAVENOUS SOLUTION
150.0000 mg | Freq: Once | INTRAVENOUS | Status: AC
Start: 2021-03-28 — End: 2021-03-28
  Administered 2021-03-28: 14:00:00 150 mg via INTRAVENOUS
  Administered 2021-03-28: 14:00:00 0 mg via INTRAVENOUS
  Filled 2021-03-28: qty 5

## 2021-03-28 MED ORDER — SODIUM CHLORIDE 0.9 % (FLUSH) INJECTION SYRINGE
10.0000 mL | INJECTION | Freq: Once | INTRAMUSCULAR | Status: AC
Start: 2021-03-28 — End: 2021-03-28
  Administered 2021-03-28: 10 mL

## 2021-03-28 MED ORDER — SODIUM CHLORIDE 0.9 % INTRAVENOUS SOLUTION
12.0000 mg | Freq: Once | INTRAVENOUS | Status: AC
Start: 2021-03-28 — End: 2021-03-28
  Administered 2021-03-28: 15:00:00 0 mg via INTRAVENOUS
  Administered 2021-03-28: 14:00:00 12 mg via INTRAVENOUS
  Filled 2021-03-28: qty 1.2

## 2021-03-28 MED ORDER — SODIUM CHLORIDE 0.9 % INTRAVENOUS SOLUTION
375.0000 mg/m2 | Freq: Once | INTRAVENOUS | Status: AC
Start: 2021-03-28 — End: 2021-03-28
  Administered 2021-03-28: 15:00:00 525 mg via INTRAVENOUS
  Administered 2021-03-28: 15:00:00 0 mg via INTRAVENOUS
  Filled 2021-03-28: qty 20

## 2021-03-28 MED ORDER — SODIUM CHLORIDE 0.9 % INTRAVENOUS SOLUTION
224.4000 mg | Freq: Once | INTRAVENOUS | Status: AC
Start: 2021-03-28 — End: 2021-03-28
  Administered 2021-03-28: 15:00:00 225 mg via INTRAVENOUS
  Administered 2021-03-28: 15:00:00 0 mg via INTRAVENOUS
  Filled 2021-03-28: qty 22.5

## 2021-03-28 MED ORDER — HEPARIN, PORCINE (PF) 100 UNIT/ML INTRAVENOUS SYRINGE
5.0000 mL | INJECTION | Freq: Once | INTRAVENOUS | Status: AC
Start: 2021-03-28 — End: 2021-03-28
  Administered 2021-03-28: 15:00:00 5 mL
  Filled 2021-03-28: qty 5

## 2021-03-28 MED ORDER — SODIUM CHLORIDE 0.9% FLUSH BAG - 250 ML
INTRAVENOUS | Status: DC | PRN
Start: 2021-03-28 — End: 2021-03-29

## 2021-03-28 MED ORDER — PALONOSETRON 0.25 MG/5 ML INTRAVENOUS SOLUTION
0.2500 mg | Freq: Once | INTRAVENOUS | Status: AC
Start: 2021-03-28 — End: 2021-03-28
  Administered 2021-03-28: 14:00:00 0.25 mg via INTRAVENOUS
  Filled 2021-03-28: qty 5

## 2021-03-28 NOTE — Discharge Instructions (Signed)
Your magnesium level is below normal and you will need to take an over-the-counter magnesium replacement.     Please Begin taking Magnesium Oxide 400 mg, Twice a day for 1 month.

## 2021-03-28 NOTE — Nurses Notes (Signed)
Patient's potassium level is low, requiring replacement per protocol. Oral potassium administered in clinic. Patient's magnesium level is low, requiring replacement per protocol. Patient provided with oral magnesium supplementation counseling. Pt verbalized understanding of both.

## 2021-03-28 NOTE — Nurses Notes (Signed)
Patient discharged home with family, to vehicle via WC.  AVS reviewed with patient/care giver.  A written copy of the AVS and discharge instructions was given to the patient/care giver.  Questions sufficiently answered as needed.  Patient/care giver encouraged to follow up with PCP as indicated.  In the event of an emergency, patient/care giver instructed to call 911 or go to the nearest emergency room.

## 2021-03-29 ENCOUNTER — Other Ambulatory Visit (INDEPENDENT_AMBULATORY_CARE_PROVIDER_SITE_OTHER): Payer: Self-pay | Admitting: Family

## 2021-03-29 ENCOUNTER — Other Ambulatory Visit (HOSPITAL_COMMUNITY): Payer: Self-pay | Admitting: Hematology & Oncology

## 2021-03-29 ENCOUNTER — Inpatient Hospital Stay (HOSPITAL_COMMUNITY)
Admission: RE | Admit: 2021-03-29 | Discharge: 2021-03-29 | Disposition: A | Discharge status: Still In Hospital | Payer: Medicare Other | Source: Ambulatory Visit | Attending: Hematology & Oncology | Admitting: Hematology & Oncology

## 2021-03-29 ENCOUNTER — Other Ambulatory Visit: Payer: Self-pay

## 2021-03-29 VITALS — BP 120/63 | HR 92 | Temp 97.8°F | Resp 18 | Ht 60.98 in

## 2021-03-29 DIAGNOSIS — R630 Anorexia: Secondary | ICD-10-CM

## 2021-03-29 DIAGNOSIS — D61811 Other drug-induced pancytopenia: Secondary | ICD-10-CM

## 2021-03-29 DIAGNOSIS — C3490 Malignant neoplasm of unspecified part of unspecified bronchus or lung: Secondary | ICD-10-CM

## 2021-03-29 DIAGNOSIS — D708 Other neutropenia: Secondary | ICD-10-CM

## 2021-03-29 MED ORDER — FILGRASTIM-SNDZ 300 MCG/0.5 ML INJECTION SYRINGE
300.0000 ug | INJECTION | Freq: Once | INTRAMUSCULAR | Status: AC
Start: 2021-03-29 — End: 2021-03-29
  Administered 2021-03-29: 16:00:00 300 ug via SUBCUTANEOUS
  Filled 2021-03-29: qty 0.5

## 2021-03-29 NOTE — Nurses Notes (Signed)
Patient discharged home.  AVS reviewed with patient.  A written copy of the AVS and discharge instructions was given to the patient.  Questions sufficiently answered as needed.  Patient encouraged to follow up with PCP as indicated.  In the event of an emergency, patient instructed to call 911 or go to the nearest emergency room.

## 2021-03-30 ENCOUNTER — Inpatient Hospital Stay (HOSPITAL_COMMUNITY)
Admission: RE | Admit: 2021-03-30 | Discharge: 2021-03-30 | Disposition: A | Discharge status: Still In Hospital | Payer: Medicare Other | Source: Ambulatory Visit

## 2021-03-30 DIAGNOSIS — C3490 Malignant neoplasm of unspecified part of unspecified bronchus or lung: Secondary | ICD-10-CM

## 2021-03-30 DIAGNOSIS — D61811 Other drug-induced pancytopenia: Secondary | ICD-10-CM

## 2021-03-30 DIAGNOSIS — D708 Other neutropenia: Secondary | ICD-10-CM

## 2021-03-30 MED ORDER — FILGRASTIM-SNDZ 300 MCG/0.5 ML INJECTION SYRINGE
300.0000 ug | INJECTION | Freq: Once | INTRAMUSCULAR | Status: AC
Start: 2021-03-30 — End: 2021-03-30
  Administered 2021-03-30: 09:00:00 300 ug via SUBCUTANEOUS
  Filled 2021-03-30: qty 0.5

## 2021-03-31 ENCOUNTER — Inpatient Hospital Stay (HOSPITAL_COMMUNITY)
Admission: RE | Admit: 2021-03-31 | Discharge: 2021-03-31 | Disposition: A | Discharge status: Still In Hospital | Payer: Medicare Other | Source: Ambulatory Visit

## 2021-03-31 DIAGNOSIS — D61811 Other drug-induced pancytopenia: Secondary | ICD-10-CM

## 2021-03-31 DIAGNOSIS — C3490 Malignant neoplasm of unspecified part of unspecified bronchus or lung: Secondary | ICD-10-CM

## 2021-03-31 DIAGNOSIS — D708 Other neutropenia: Secondary | ICD-10-CM

## 2021-03-31 MED ORDER — FILGRASTIM-SNDZ 300 MCG/0.5 ML INJECTION SYRINGE
300.0000 ug | INJECTION | Freq: Once | INTRAMUSCULAR | Status: AC
Start: 2021-03-31 — End: 2021-03-31
  Administered 2021-03-31: 11:00:00 300 ug via SUBCUTANEOUS
  Filled 2021-03-31: qty 0.5

## 2021-03-31 NOTE — Nurses Notes (Signed)
Patient transported by family via wheelchair. Patient discharged home with family.  AVS reviewed with patient/care giver.  A written copy of the AVS and discharge instructions was given to the patient/care giver.  Questions sufficiently answered as needed.  Patient/care giver encouraged to follow up with PCP as indicated.  In the event of an emergency, patient/care giver instructed to call 911 or go to the nearest emergency room.

## 2021-04-01 ENCOUNTER — Ambulatory Visit (HOSPITAL_COMMUNITY): Payer: Medicare Other | Admitting: Hematology & Oncology

## 2021-04-02 ENCOUNTER — Other Ambulatory Visit: Payer: Self-pay

## 2021-04-02 ENCOUNTER — Inpatient Hospital Stay
Admission: RE | Admit: 2021-04-02 | Discharge: 2021-04-02 | Disposition: A | Discharge status: Still In Hospital | Payer: Medicare Other | Source: Ambulatory Visit | Attending: Hematology & Oncology | Admitting: Hematology & Oncology

## 2021-04-02 VITALS — BP 112/66 | HR 100 | Temp 98.6°F | Resp 20 | Ht 60.98 in | Wt 105.2 lb

## 2021-04-02 DIAGNOSIS — C3490 Malignant neoplasm of unspecified part of unspecified bronchus or lung: Secondary | ICD-10-CM

## 2021-04-02 DIAGNOSIS — C3491 Malignant neoplasm of unspecified part of right bronchus or lung: Secondary | ICD-10-CM

## 2021-04-02 DIAGNOSIS — C7951 Secondary malignant neoplasm of bone: Secondary | ICD-10-CM

## 2021-04-02 MED ORDER — HEPARIN, PORCINE (PF) 100 UNIT/ML INTRAVENOUS SYRINGE
5.0000 mL | INJECTION | Freq: Once | INTRAVENOUS | Status: AC
Start: 2021-04-02 — End: 2021-04-02
  Administered 2021-04-02: 12:00:00 5 mL
  Filled 2021-04-02: qty 5

## 2021-04-02 MED ORDER — SODIUM CHLORIDE 0.9 % (FLUSH) INJECTION SYRINGE
10.0000 mL | INJECTION | Freq: Once | INTRAMUSCULAR | Status: AC
Start: 2021-04-02 — End: 2021-04-02
  Administered 2021-04-02: 12:00:00 10 mL

## 2021-04-02 MED ORDER — ZOLEDRONIC ACID 4 MG/5 ML INTRAVENOUS SOLUTION
3.0000 mg | Freq: Once | INTRAVENOUS | Status: AC
Start: 2021-04-02 — End: 2021-04-02
  Administered 2021-04-02: 11:00:00 3 mg via INTRAVENOUS
  Administered 2021-04-02: 12:00:00 0 mg via INTRAVENOUS
  Filled 2021-04-02: qty 3.75

## 2021-04-02 NOTE — Nurses Notes (Signed)
Patient discharged home.  AVS reviewed with patient.  A written copy of the AVS and discharge instructions was given to the patient.  Questions sufficiently answered as needed.  Patient encouraged to follow up with PCP as indicated.  In the event of an emergency, patient instructed to call 911 or go to the nearest emergency room.

## 2021-04-02 NOTE — Nurses Notes (Signed)
Corrected calcium 10.3  Creatinine clearance 33.07

## 2021-04-04 ENCOUNTER — Other Ambulatory Visit (INDEPENDENT_AMBULATORY_CARE_PROVIDER_SITE_OTHER): Payer: Self-pay | Admitting: Family

## 2021-04-05 ENCOUNTER — Other Ambulatory Visit (HOSPITAL_COMMUNITY): Payer: Self-pay | Admitting: Hematology & Oncology

## 2021-04-05 ENCOUNTER — Encounter (HOSPITAL_COMMUNITY): Payer: Self-pay

## 2021-04-05 DIAGNOSIS — C7951 Secondary malignant neoplasm of bone: Secondary | ICD-10-CM

## 2021-04-05 DIAGNOSIS — C3491 Malignant neoplasm of unspecified part of right bronchus or lung: Secondary | ICD-10-CM

## 2021-04-05 NOTE — Progress Notes (Signed)
SW Note:  Faxed Home health other to [facility].  Cheree Ditto, SOCIAL WORKER

## 2021-04-07 ENCOUNTER — Encounter (HOSPITAL_COMMUNITY): Payer: Self-pay | Admitting: Hematology & Oncology

## 2021-04-08 ENCOUNTER — Telehealth (HOSPITAL_COMMUNITY): Payer: Self-pay

## 2021-04-08 NOTE — Telephone Encounter (Signed)
Pt called to report she had called on-call physician 2 days ago for constipation. Pt took fleets suppository as instructed and now has diarrhea. Advised pt to take one dose Imodium and if it does not help, take second dose and drink at least 8 cups of water throughout the day. Pt is to call back if diarrhea continues.    Servando Snare  Triage nurse

## 2021-04-11 ENCOUNTER — Other Ambulatory Visit (HOSPITAL_COMMUNITY): Payer: Self-pay | Admitting: Hematology & Oncology

## 2021-04-11 DIAGNOSIS — I82412 Acute embolism and thrombosis of left femoral vein: Secondary | ICD-10-CM

## 2021-04-11 MED ORDER — RIVAROXABAN 20 MG TABLET
20.0000 mg | ORAL_TABLET | Freq: Every evening | ORAL | 1 refills | Status: AC
Start: 2021-04-11 — End: ?

## 2021-04-15 ENCOUNTER — Other Ambulatory Visit: Payer: Self-pay

## 2021-04-15 ENCOUNTER — Encounter (HOSPITAL_COMMUNITY): Payer: Self-pay | Admitting: Hematology & Oncology

## 2021-04-15 ENCOUNTER — Inpatient Hospital Stay (HOSPITAL_COMMUNITY)
Admission: RE | Admit: 2021-04-15 | Discharge: 2021-04-15 | Disposition: A | Discharge status: Still In Hospital | Payer: Medicare Other | Source: Ambulatory Visit | Attending: Hematology & Oncology | Admitting: Hematology & Oncology

## 2021-04-15 ENCOUNTER — Ambulatory Visit (HOSPITAL_BASED_OUTPATIENT_CLINIC_OR_DEPARTMENT_OTHER): Payer: Medicare Other | Admitting: Hematology & Oncology

## 2021-04-15 VITALS — BP 122/58 | HR 98 | Temp 98.2°F | Resp 24 | Ht 60.98 in | Wt 106.0 lb

## 2021-04-15 DIAGNOSIS — Z923 Personal history of irradiation: Secondary | ICD-10-CM | POA: Insufficient documentation

## 2021-04-15 DIAGNOSIS — Z993 Dependence on wheelchair: Secondary | ICD-10-CM | POA: Insufficient documentation

## 2021-04-15 DIAGNOSIS — Z452 Encounter for adjustment and management of vascular access device: Secondary | ICD-10-CM | POA: Insufficient documentation

## 2021-04-15 DIAGNOSIS — D61810 Antineoplastic chemotherapy induced pancytopenia: Secondary | ICD-10-CM

## 2021-04-15 DIAGNOSIS — C3491 Malignant neoplasm of unspecified part of right bronchus or lung: Secondary | ICD-10-CM | POA: Insufficient documentation

## 2021-04-15 DIAGNOSIS — Z7901 Long term (current) use of anticoagulants: Secondary | ICD-10-CM | POA: Insufficient documentation

## 2021-04-15 DIAGNOSIS — Z87891 Personal history of nicotine dependence: Secondary | ICD-10-CM | POA: Insufficient documentation

## 2021-04-15 DIAGNOSIS — T50905A Adverse effect of unspecified drugs, medicaments and biological substances, initial encounter: Secondary | ICD-10-CM | POA: Insufficient documentation

## 2021-04-15 DIAGNOSIS — C7951 Secondary malignant neoplasm of bone: Secondary | ICD-10-CM | POA: Insufficient documentation

## 2021-04-15 DIAGNOSIS — C3411 Malignant neoplasm of upper lobe, right bronchus or lung: Secondary | ICD-10-CM | POA: Insufficient documentation

## 2021-04-15 DIAGNOSIS — D708 Other neutropenia: Secondary | ICD-10-CM | POA: Insufficient documentation

## 2021-04-15 DIAGNOSIS — T451X5A Adverse effect of antineoplastic and immunosuppressive drugs, initial encounter: Secondary | ICD-10-CM | POA: Insufficient documentation

## 2021-04-15 DIAGNOSIS — D649 Anemia, unspecified: Secondary | ICD-10-CM | POA: Insufficient documentation

## 2021-04-15 DIAGNOSIS — R5381 Other malaise: Secondary | ICD-10-CM

## 2021-04-15 DIAGNOSIS — I82412 Acute embolism and thrombosis of left femoral vein: Secondary | ICD-10-CM | POA: Insufficient documentation

## 2021-04-15 DIAGNOSIS — Z5111 Encounter for antineoplastic chemotherapy: Secondary | ICD-10-CM | POA: Insufficient documentation

## 2021-04-15 DIAGNOSIS — C3490 Malignant neoplasm of unspecified part of unspecified bronchus or lung: Secondary | ICD-10-CM

## 2021-04-15 DIAGNOSIS — D61811 Other drug-induced pancytopenia: Secondary | ICD-10-CM | POA: Insufficient documentation

## 2021-04-15 LAB — COMPREHENSIVE METABOLIC PANEL, NON-FASTING
ALBUMIN: 3.1 g/dL — ABNORMAL LOW (ref 3.4–4.8)
ALKALINE PHOSPHATASE: 75 U/L (ref 55–145)
ALT (SGPT): 23 U/L — ABNORMAL HIGH (ref 8–22)
ANION GAP: 7 mmol/L (ref 4–13)
AST (SGOT): 28 U/L (ref 8–45)
BILIRUBIN TOTAL: 0.2 mg/dL — ABNORMAL LOW (ref 0.3–1.3)
BUN/CREA RATIO: 13 (ref 6–22)
BUN: 17 mg/dL (ref 8–25)
CALCIUM: 8.7 mg/dL — ABNORMAL LOW (ref 8.8–10.2)
CHLORIDE: 110 mmol/L (ref 96–111)
CO2 TOTAL: 23 mmol/L (ref 23–31)
CREATININE: 1.35 mg/dL — ABNORMAL HIGH (ref 0.60–1.05)
ESTIMATED GFR: 43 mL/min/BSA — ABNORMAL LOW (ref >=60)
GLUCOSE: 88 mg/dL (ref 65–125)
POTASSIUM: 4.1 mmol/L (ref 3.5–5.1)
PROTEIN TOTAL: 6.8 g/dL (ref 6.0–8.0)
SODIUM: 140 mmol/L (ref 136–145)

## 2021-04-15 LAB — CBC W/AUTO DIFF
HCT: 23.9 % — ABNORMAL LOW (ref 34.8–46.0)
HGB: 7.9 g/dL — ABNORMAL LOW (ref 11.5–16.0)
MCH: 33.1 pg — ABNORMAL HIGH (ref 26.0–32.0)
MCHC: 33.1 g/dL (ref 31.0–35.5)
MCV: 100 fL (ref 78.0–100.0)
MPV: 9.2 fL (ref 8.7–12.5)
PLATELETS: 77 10*3/uL — ABNORMAL LOW (ref 150–400)
RBC: 2.39 10*6/uL — ABNORMAL LOW (ref 3.85–5.22)
RDW-CV: 15.3 % (ref 11.5–15.5)
WBC: 2.4 10*3/uL — ABNORMAL LOW (ref 3.7–11.0)

## 2021-04-15 LAB — MANUAL DIFF AND MORPHOLOGY-SYSMEX
BASOPHIL #: <0.1 10*3/uL (ref <=0.20)
BASOPHIL %: 1 %
EOSINOPHIL #: <0.1 10*3/uL (ref <=0.50)
EOSINOPHIL %: 1 %
LYMPHOCYTE #: 0.38 10*3/uL — ABNORMAL LOW (ref 1.00–4.80)
LYMPHOCYTE %: 16 %
METAMYELOCYTE %: 3 %
MONOCYTE #: 0.48 10*3/uL (ref 0.20–1.10)
MONOCYTE %: 20 %
MYELOCYTE %: 1 %
NEUTROPHIL #: 1.39 10*3/uL — ABNORMAL LOW (ref 1.50–7.70)
NEUTROPHIL %: 45 %
NEUTROPHIL BANDS %: 13 %
RBC MORPHOLOGY: NORMAL

## 2021-04-15 LAB — MAGNESIUM: MAGNESIUM: 2 mg/dL (ref 1.8–2.6)

## 2021-04-15 MED ORDER — DEXAMETHASONE 4 MG TABLET
4.0000 mg | ORAL_TABLET | ORAL | 2 refills | Status: DC
Start: 2021-04-15 — End: 2021-05-17

## 2021-04-15 NOTE — Progress Notes (Unsigned)
Return Patient Progress Note    Date: 04/15/2021    Name: Gina Barrett  MRN: [id number]  DOB: 11-Aug-1955   Referring Physician: Self, Referral  Primary Care Provider: Lanier Ensign, FNP-C    Reason for visit/consultation Lung Cancer (Pre- chemo)      Interval History:   Ms. Gina Barrett returns to clinic with her husband and daughter-in-law for follow-up metastatic lung cancer for which chemotherapy had been delayed due to weakness and deconditioning following hospitalization from 10/01 to 03/19/2021.  She is on physical therapy and feeling better.  She has gained some weight.  Her activities he remains marginal although slightly improved from the time of her metastatic cancer diagnosis.      ROS:     Review of Systems   Musculoskeletal: Positive for gait problem (wheelchair).   Neurological: Positive for gait problem (wheelchair).      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  -PET-CT scan on 09/06/2016 showed 10 mm right upper lobe FDG avid lesion that is slightly larger than 2017 CT and concerning for malignancy.  A separate 3 cm long area of tracer uptake in the left lower lobe was noted.  -CT-guided biopsy a [facility] on 10/02/2016 was positive for adenocarcinoma that is well to moderately differentiated.  -Mediastinoscopy 11/13/2016 positive for left mid paratrachael    Stage: IV, initially diagnosed as IIIB     Molecular/special studies:  ROS-1, ALK-1, EGFR, PDL1 all negative    Treatment & Surveillance:   1. S/p chemoradiation with carboplatin + paclitaxel + RT 66.6 Gy completed  02/10/19/18    2. Serial imaging since completion of therapy has been negative for recurrence.  Most recent imaging in on 12/03/2018 was also negative but showed a new T6 compression abnormality.     3. Chest CT with contrast from 09/23/2019 which in summary showed increasing pleural based density in the LLL which may be infectious or inflammatory.  There is increased 1.7cm LN in left hilum ans stable compression abnormality of T6.    4. Biopsy of left LL pleural bases mass on 11/02/2019 showed benign fibrovascular tissue with focal chronic inflammation.  no lung parechyma or malgnancy identified.    5. Patient developed progressive musculoskeletal pain around October /November of 2021 evaluation with multiple imaging studies highly suggest metastatic disease.  Evaluation for plasma cell was noncontributory.  Patient was referred to orthopedic surgery (Dr. Hervey Ard, by family request and known to patient)  for surgical recommendation especially regarding weight-bearing.    6. Biopsy of paraspinal soft tissue mass on 07/04/2020 confirmed metastatic carcinoma consistent with lung primary. CARIS NGS negative    7.Radiation Oncology for radiation to the acetabulum as well as the paraspinal mass started 07/19/2020 x 10 , ending 07/31/2020    8. Mediport placed  07/19/2020    9.Begin Zoledronic acid.  Has dentures and no dental eval needed    10. Brain MRI planne2/12/2020     11.  Begin palliative carboplatin & pemetrexed on 08/03/2020       ________________________________________________________________________________________________________________________________________________     Objective   Objective:   BP (!) 122/58 (Site: Left, Patient Position: Sitting)   Pulse 98 Comment: apical- MV, MA  Temp 36.8  C (98.2 F) (Thermal Scan)   Resp (!) 24   Ht 1.549 m (5' 0.98") Comment: *  Wt 48.1 kg (106 lb)   SpO2 100% Comment: ra  BMI 20.04 kg/m       ECOG Status: 3 - Capable of only limited selfcare, confined to bed or  chair more than 50% of waking hour.  Physical Exam     Past Medical History:   Diagnosis Date   . Abnormal Pap smear     ASCUS, ASCUS cannot rule out HGSIL   . Cancer (CMS HCC)     non small cell lung cancer (R)-s/p Chemo/Radiation completed 8/18   . Colon polyp    . Dysphagia    . Hypothyroid    . Non-small cell lung cancer, right (CMS [place])     s/p Chemo completed 9/18   . Raynaud's disease    . STD (sexually transmitted disease)     HSV   . Unspecified disorder of thyroid     Hypothyroidism         Current Outpatient Medications   Medication Sig   . cholecalciferol, vitamin D3, 25 mcg (1,000 unit) Oral Tablet Take 2 Tablets (2,000 Units total) by mouth Once a day   . clobetasoL-emollient no.65 0.05 % Apply externally Combo Pack Apply topically Twice daily To buttock   . clotrimazole (LOTRIMIN) 1 % Cream Apply topically   . Desoximetasone (TOPICORT) 0.25 % Cream Apply topically Twice daily To Ear   . dexAMETHasone (DECADRON) 4 mg Oral Tablet Take 1 Tablet (4 mg total) by mouth Per instructions Take one tablet by mouth twice a day for 3 days after each cycle of chemo   . levothyroxine (SYNTHROID) 88 mcg Oral Tablet TAKE ONE TABLET BY MOUTH EVERY DAY   . loratadine (CLARITIN) 10 mg Oral Tablet take 10 mg by mouth Once a day.   . megestroL (MEGACE) 40 mg Oral Tablet Take 40 mg by mouth   . mirtazapine (REMERON) 15 mg Oral Tablet TAKE ONE TABLET BY MOUTH EVERY EVENING   . MULTIVITAMIN ORAL Take by mouth Once a day Centrum Silver Women's 50+   . oxyCODONE-acetaminophen (PERCOCET) 10-325 mg Oral tablet TAKE ONE TABLET BY MOUTH EVERY 6 HOURS AS NEEDED FOR PAIN   . rivaroxaban (XARELTO) 20 mg Oral Tablet Take 1 Tablet (20 mg total) by mouth Every evening with dinner Indications: blood clot in a deep vein of the extremities   . SENNA PLUS 8.6-50 mg Oral Tablet TAKE ONE TABLET BY MOUTH EVERY EVENING   . UNKNOWN MEDICATION (UNKNOWN MEDICATION) Administer into affected eye(s) [name] 128 Eye Drops OTC 4x daily  [name] 128 Eye Gel  OTC Nightly     Allergies as of 04/15/2021 - Reviewed 04/15/2021   Allergen Reaction Noted   . Penicillins  08/15/2010       Social History  Occupation:   Social History     Occupational History     Employer: [place]: [place] [address]   reports that she quit smoking about 22 years ago. Her smoking use included cigarettes. She started smoking about 50 years ago. She has never used smokeless tobacco. She reports being sexually active and has had partner(s) who are female. She reports using the following method of birth control/protection: None. She reports that she does not drink alcohol and does not use drugs.    Labs:  Labs reviewed and interpreted:  No results found for this or any previous  visit (from the past 96 hour(s)).    Radiology: All recent pertinent radiologic images and reports reviewed          Assessment/Plan:     1. Non-small cell lung cancer, right (CMS [facility])  65 year old woman with metastatic non-small-cell lung cancer complicated by destructive bony metastasis that has left hand mostly wheelchair-bound.  She has improved since her recent metastatic diagnosis.  She is able to bathe herself and transfer from bed to chair and toilet.  The goal of care remains palliative with hospice when her quality of life declines less than what we have now.  I informed her that her current ECOG status of 3 makes him more susceptible to the adverse effect of chemotherapy and that I am giving her the benefit of the doubt and will need to stop chemotherapy if toxicity continues to outweigh benefit.    - dexAMETHasone (DECADRON) 4 mg Oral Tablet; Take 1 Tablet (4 mg total) by mouth Per instructions Take one tablet by mouth twice a day for 3 days after each cycle of chemo  Dispense: 24 Tablet; Refill: 2    2. Pancytopenia due to antineoplastic chemotherapy (CMS HCC)  -pt is s/p 2 pack PRBC on 10/1 and has declined again since recent chemo  -Repeat labs next week and transfuse 2 units as needed for  Hgb <7gm or 1 unit for HGB <8gm.  -Delay next chemo planned for 11/10 if no improved pancytopenia with repeat labs week of 11/7    3. Bone metastasis (CMS HCC)  -her pain is controlled  -Continue zoledronic acid.  She has no dental complaints and wears dentures.    4. Left femoral vein DVT (CMS HCC)  -patient is tolerating Eliquis well without any bleeding complications.  She has sufficient refills at home.    5. Physical deconditioning  -continue physical therapy fussy longest possible      Return in about 4 weeks (around 05/13/2021).    Mertie Moores, MD        CC:  PCP General:  Lanier Ensign, FNP-C  [address]  [place] [address]      Portions of this note may be dictated using voice recognition software or a dictation service. Variances in spelling and vocabulary are possible and unintentional. Not all errors are caught/corrected. Please notify the [facility] if any discrepancies are noted or if the meaning of any statement is not clear.

## 2021-04-16 ENCOUNTER — Other Ambulatory Visit (HOSPITAL_COMMUNITY): Payer: Self-pay | Admitting: Hematology & Oncology

## 2021-04-16 DIAGNOSIS — C7951 Secondary malignant neoplasm of bone: Secondary | ICD-10-CM

## 2021-04-16 DIAGNOSIS — C3491 Malignant neoplasm of unspecified part of right bronchus or lung: Secondary | ICD-10-CM

## 2021-04-16 DIAGNOSIS — G893 Neoplasm related pain (acute) (chronic): Secondary | ICD-10-CM

## 2021-04-18 ENCOUNTER — Ambulatory Visit (INDEPENDENT_AMBULATORY_CARE_PROVIDER_SITE_OTHER): Payer: Medicare Other | Admitting: Internal Medicine

## 2021-04-18 ENCOUNTER — Encounter (HOSPITAL_COMMUNITY): Payer: Self-pay | Admitting: Internal Medicine

## 2021-04-18 ENCOUNTER — Inpatient Hospital Stay
Admission: RE | Admit: 2021-04-18 | Discharge: 2021-04-18 | Disposition: A | Discharge status: Home / Self Care | Payer: Medicare Other | Source: Ambulatory Visit | Attending: Internal Medicine | Admitting: Internal Medicine

## 2021-04-18 ENCOUNTER — Inpatient Hospital Stay (HOSPITAL_COMMUNITY): Payer: Medicare Other

## 2021-04-18 ENCOUNTER — Other Ambulatory Visit: Payer: Self-pay

## 2021-04-18 VITALS — BP 109/68 | HR 98 | Temp 98.2°F | Resp 16 | Ht 60.98 in | Wt 106.0 lb

## 2021-04-18 DIAGNOSIS — C3490 Malignant neoplasm of unspecified part of unspecified bronchus or lung: Secondary | ICD-10-CM | POA: Insufficient documentation

## 2021-04-18 DIAGNOSIS — J449 Chronic obstructive pulmonary disease, unspecified: Secondary | ICD-10-CM | POA: Insufficient documentation

## 2021-04-18 NOTE — Progress Notes (Signed)
Subjective:     Patient ID:  Gina Barrett is an 65 y.o. female     Chief Complaint:    Chief Complaint   Patient presents with    COPD    Lung Cancer       HPI    She had a recent admission for fever and hemoptysis.  She was treated empirically with Levaquin and the hemoptysis was thought to be due to her Xarelto.    She is otherwise feeling well.  Her breathing is okay and she has some cough but no PND orthopnea.    She does express some increased lower extremity.  Swelling no fevers and chills.    She feels like her inhaler works well for her and she has received a flu shot  Past Medical History  Current Outpatient Medications   Medication Sig    cholecalciferol, vitamin D3, 25 mcg (1,000 unit) Oral Tablet Take 2 Tablets (2,000 Units total) by mouth Once a day    clobetasoL-emollient no.65 0.05 % Apply externally Combo Pack Apply topically Twice daily To buttock    clotrimazole (LOTRIMIN) 1 % Cream Apply topically    Desoximetasone (TOPICORT) 0.25 % Cream Apply topically Twice daily To Ear    dexAMETHasone (DECADRON) 4 mg Oral Tablet Take 1 Tablet (4 mg total) by mouth Per instructions Take one tablet by mouth twice a day for 3 days after each cycle of chemo    levothyroxine (SYNTHROID) 88 mcg Oral Tablet TAKE ONE TABLET BY MOUTH EVERY DAY    loratadine (CLARITIN) 10 mg Oral Tablet take 10 mg by mouth Once a day.    megestroL (MEGACE) 40 mg Oral Tablet Take 40 mg by mouth    mirtazapine (REMERON) 15 mg Oral Tablet TAKE ONE TABLET BY MOUTH EVERY EVENING    MULTIVITAMIN ORAL Take by mouth Once a day Centrum Silver Women's 50+    oxyCODONE-acetaminophen (PERCOCET) 10-325 mg Oral tablet TAKE ONE TABLET BY MOUTH EVERY 6 HOURS AS NEEDED FOR PAIN    rivaroxaban (XARELTO) 20 mg Oral Tablet Take 1 Tablet (20 mg total) by mouth Every evening with dinner Indications: blood clot in a deep vein of the extremities    SENNA PLUS 8.6-50 mg Oral Tablet TAKE ONE TABLET BY MOUTH EVERY EVENING    UNKNOWN  MEDICATION (UNKNOWN MEDICATION) Administer into affected eye(s) [name] 128 Eye Drops OTC 4x daily  [name] 128 Eye Gel OTC Nightly     Allergies   Allergen Reactions    Penicillins      Past Medical History:   Diagnosis Date    Abnormal Pap smear     ASCUS, ASCUS cannot rule out HGSIL    Cancer (CMS HCC)     non small cell lung cancer (R)-s/p Chemo/Radiation completed 8/18    Colon polyp     Dysphagia     Hypothyroid     Non-small cell lung cancer, right (CMS HCC)     s/p Chemo completed 9/18    Raynaud's disease     STD (sexually transmitted disease)     HSV    Unspecified disorder of thyroid     Hypothyroidism         Past Surgical History:   Procedure Laterality Date    HX CATARACT REMOVAL Bilateral 2011    HX COLONOSCOPY  2009    HX ELBOW SURGERY      HX FOOT SURGERY      HX TUBAL LIGATION  1978  LUNG BIOPSY Right 10/02/2016    MEDIASTINOSCOPY  11/13/2016    PB FOREARM/WRIST SURGERY UNLISTED  1999    carpal tunnel release    PORTACATH PLACEMENT  12/12/2016         Family Medical History:     Problem Relation (Age of Onset)    Breast Cancer Mother    Diabetes Paternal Grandfather, Brother    No Known Problems Father, Sister, Maternal Grandmother, Maternal Grandfather, Paternal 78, Daughter, Son, Maternal Aunt, Maternal Uncle, Paternal 55, Paternal Uncle, Other          Social History     Socioeconomic History    Marital status: Married   Occupational History     Employer: [employer]   Tobacco Use    Smoking status: Former     Years: 20.00     Types: Cigarettes     Start date: 12/09/1970     Quit date: 09/08/1998     Years since quitting: 22.6    Smokeless tobacco: Never   Substance and Sexual Activity    Alcohol use: No    Drug use: No    Sexual activity: Yes     Partners: Male     Birth control/protection: None   Other Topics Concern    Breast Self Exam Yes    Calcium intake adequate No    Ability to Walk 1 Flight of Steps without SOB/CP Yes    Ability To Do Own ADL's  Yes     Review of Systems   Constitutional: Negative for activity change.   HENT: Negative.    Respiratory: Positive for cough. Negative for shortness of breath.    Cardiovascular: Positive for leg swelling.   Neurological: Negative.    Psychiatric/Behavioral: Negative.    All other systems reviewed and are negative.      Objective:     Physical Exam  Constitutional:       Appearance: Normal appearance.   Cardiovascular:      Rate and Rhythm: Normal rate and regular rhythm.      Heart sounds: Normal heart sounds.   Pulmonary:      Comments: Crackles in the right base  Musculoskeletal:      Right lower leg: Edema present.      Left lower leg: Edema present.      Comments: One to 2+ edema of the legs   Neurological:      Mental Status: She is alert.   Psychiatric:         Mood and Affect: Mood normal.         Ortho Exam    Assessment & Plan:       ICD-10-CM    1. Chronic obstructive pulmonary disease, unspecified COPD type (CMS HCC)  J44.9       2. Malignant neoplasm of lung, unspecified laterality, unspecified part of lung (CMS HCC)  C34.90           History of COPD doing well.    Lung cancer currently being treated.    Right base crackles and lower extremity edema unclear cause.    Plan  Chest x-ray today and she may need an echo.  Continue current inhalers

## 2021-04-21 ENCOUNTER — Encounter (HOSPITAL_COMMUNITY): Payer: Self-pay | Admitting: Hematology & Oncology

## 2021-04-22 ENCOUNTER — Telehealth (HOSPITAL_COMMUNITY): Payer: Self-pay

## 2021-04-22 ENCOUNTER — Inpatient Hospital Stay (HOSPITAL_COMMUNITY)
Admission: RE | Admit: 2021-04-22 | Discharge: 2021-04-22 | Disposition: A | Discharge status: Still In Hospital | Payer: Medicare Other | Source: Ambulatory Visit | Attending: Hematology & Oncology | Admitting: Hematology & Oncology

## 2021-04-22 ENCOUNTER — Other Ambulatory Visit: Payer: Self-pay

## 2021-04-22 DIAGNOSIS — D649 Anemia, unspecified: Secondary | ICD-10-CM

## 2021-04-22 DIAGNOSIS — C3490 Malignant neoplasm of unspecified part of unspecified bronchus or lung: Secondary | ICD-10-CM

## 2021-04-22 DIAGNOSIS — C3491 Malignant neoplasm of unspecified part of right bronchus or lung: Secondary | ICD-10-CM

## 2021-04-22 DIAGNOSIS — C7951 Secondary malignant neoplasm of bone: Secondary | ICD-10-CM

## 2021-04-22 LAB — CBC W/AUTO DIFF
BASOPHIL #: <0.1 10*3/uL (ref <=0.20)
BASOPHIL %: 1 %
EOSINOPHIL #: <0.1 10*3/uL (ref <=0.50)
EOSINOPHIL %: 1 %
HCT: 23.8 % — ABNORMAL LOW (ref 34.8–46.0)
HGB: 7.6 g/dL — ABNORMAL LOW (ref 11.5–16.0)
IMMATURE GRANULOCYTE #: 0.13 10*3/uL — ABNORMAL HIGH (ref <0.10)
IMMATURE GRANULOCYTE %: 3 % — ABNORMAL HIGH (ref 0–1)
LYMPHOCYTE #: 0.65 10*3/uL — ABNORMAL LOW (ref 1.00–4.80)
LYMPHOCYTE %: 15 %
MCH: 33.2 pg — ABNORMAL HIGH (ref 26.0–32.0)
MCHC: 31.9 g/dL (ref 31.0–35.5)
MCV: 103.9 fL — ABNORMAL HIGH (ref 78.0–100.0)
MONOCYTE #: 1 10*3/uL (ref 0.20–1.10)
MONOCYTE %: 23 %
MPV: 10 fL (ref 8.7–12.5)
NEUTROPHIL #: 2.56 10*3/uL (ref 1.50–7.70)
NEUTROPHIL %: 57 %
PLATELETS: 197 10*3/uL (ref 150–400)
RBC: 2.29 10*6/uL — ABNORMAL LOW (ref 3.85–5.22)
RDW-CV: 17.7 % — ABNORMAL HIGH (ref 11.5–15.5)
WBC: 4.4 10*3/uL (ref 3.7–11.0)

## 2021-04-22 LAB — COMPREHENSIVE METABOLIC PANEL, NON-FASTING
ALBUMIN: 3.1 g/dL — ABNORMAL LOW (ref 3.4–4.8)
ALKALINE PHOSPHATASE: 81 U/L (ref 55–145)
ALT (SGPT): 15 U/L (ref 8–22)
ANION GAP: 9 mmol/L (ref 4–13)
AST (SGOT): 29 U/L (ref 8–45)
BILIRUBIN TOTAL: 0.2 mg/dL — ABNORMAL LOW (ref 0.3–1.3)
BUN/CREA RATIO: 14 (ref 6–22)
BUN: 21 mg/dL (ref 8–25)
CALCIUM: 8.9 mg/dL (ref 8.8–10.2)
CHLORIDE: 109 mmol/L (ref 96–111)
CO2 TOTAL: 23 mmol/L (ref 23–31)
CREATININE: 1.45 mg/dL — ABNORMAL HIGH (ref 0.60–1.05)
ESTIMATED GFR: 40 mL/min/BSA — ABNORMAL LOW (ref >=60)
GLUCOSE: 87 mg/dL (ref 65–125)
POTASSIUM: 4.5 mmol/L (ref 3.5–5.1)
PROTEIN TOTAL: 6.9 g/dL (ref 6.0–8.0)
SODIUM: 141 mmol/L (ref 136–145)

## 2021-04-22 LAB — MAGNESIUM: MAGNESIUM: 2 mg/dL (ref 1.8–2.6)

## 2021-04-22 NOTE — Telephone Encounter (Signed)
Reached out to patient regarding resulted lab work. Dr. Holley Dexter would like patient to receive Blood treansfusion prior to next cycle of chemotheray scheduled on 04/25/21.  Patient provided appointment day/time 04/24/2021 at 8:30 for transfusion. All questions answered.

## 2021-04-22 NOTE — Telephone Encounter (Signed)
Patient notified via phone that Dr. Holley Dexter would like the patient to complete lab work today to assess need for transfusion and sufficiency of labs for planned 11/10 chemo. Per Dr. Holley Dexter, Band if HBG less than 8gm and delay chemo if wbc and platelets low.

## 2021-04-24 ENCOUNTER — Encounter (HOSPITAL_COMMUNITY): Payer: Self-pay

## 2021-04-24 ENCOUNTER — Other Ambulatory Visit: Payer: Self-pay

## 2021-04-24 ENCOUNTER — Inpatient Hospital Stay (HOSPITAL_COMMUNITY)
Admission: RE | Admit: 2021-04-24 | Discharge: 2021-04-24 | Disposition: A | Discharge status: Still In Hospital | Payer: Medicare Other | Source: Ambulatory Visit | Attending: Hematology & Oncology | Admitting: Hematology & Oncology

## 2021-04-24 VITALS — BP 128/67 | HR 89 | Temp 98.4°F | Resp 18 | Ht 60.98 in | Wt 102.6 lb

## 2021-04-24 DIAGNOSIS — C3490 Malignant neoplasm of unspecified part of unspecified bronchus or lung: Secondary | ICD-10-CM

## 2021-04-24 DIAGNOSIS — D61811 Other drug-induced pancytopenia: Secondary | ICD-10-CM

## 2021-04-24 DIAGNOSIS — Z95828 Presence of other vascular implants and grafts: Secondary | ICD-10-CM

## 2021-04-24 DIAGNOSIS — D708 Other neutropenia: Secondary | ICD-10-CM

## 2021-04-24 DIAGNOSIS — D649 Anemia, unspecified: Secondary | ICD-10-CM

## 2021-04-24 MED ORDER — HEPARIN, PORCINE (PF) 100 UNIT/ML INTRAVENOUS SYRINGE
5.0000 mL | INJECTION | Freq: Once | INTRAVENOUS | Status: AC
Start: 2021-04-24 — End: 2021-04-24
  Administered 2021-04-24: 14:00:00 5 mL
  Filled 2021-04-24: qty 5

## 2021-04-24 MED ORDER — SODIUM CHLORIDE 0.9 % (FLUSH) INJECTION SYRINGE
10.0000 mL | INJECTION | Freq: Once | INTRAMUSCULAR | Status: AC
Start: 2021-04-24 — End: 2021-04-24
  Administered 2021-04-24: 14:00:00 10 mL

## 2021-04-24 NOTE — Nurses Notes (Signed)
Blood increased to 150 ml/hr at this time.

## 2021-04-24 NOTE — Nurses Notes (Signed)
Patient discharged home with family, to vehicle via WC.  AVS reviewed with patient/care giver.  A written copy of the AVS and discharge instructions was given to the patient/care giver.  Questions sufficiently answered as needed.  Patient/care giver encouraged to follow up with PCP as indicated.  In the event of an emergency, patient/care giver instructed to call 911 or go to the nearest emergency room.

## 2021-04-25 ENCOUNTER — Encounter (HOSPITAL_COMMUNITY): Payer: Self-pay

## 2021-04-25 ENCOUNTER — Inpatient Hospital Stay (HOSPITAL_COMMUNITY)
Admission: RE | Admit: 2021-04-25 | Discharge: 2021-04-25 | Disposition: A | Discharge status: Still In Hospital | Payer: Medicare Other | Source: Ambulatory Visit | Attending: Hematology & Oncology | Admitting: Hematology & Oncology

## 2021-04-25 ENCOUNTER — Other Ambulatory Visit: Payer: Self-pay

## 2021-04-25 VITALS — BP 112/70 | HR 100 | Temp 98.1°F | Resp 28 | Ht 60.98 in

## 2021-04-25 DIAGNOSIS — C3491 Malignant neoplasm of unspecified part of right bronchus or lung: Secondary | ICD-10-CM

## 2021-04-25 DIAGNOSIS — C7951 Secondary malignant neoplasm of bone: Secondary | ICD-10-CM

## 2021-04-25 DIAGNOSIS — C801 Malignant (primary) neoplasm, unspecified: Secondary | ICD-10-CM

## 2021-04-25 DIAGNOSIS — C3490 Malignant neoplasm of unspecified part of unspecified bronchus or lung: Secondary | ICD-10-CM

## 2021-04-25 LAB — BPAM PACKED CELL ORDER
UNIT DIVISION: 0
UNIT DIVISION: 0

## 2021-04-25 LAB — TYPE AND CROSS RED CELLS - UNITS
ABO/RH(D): O POS
ANTIBODY SCREEN: NEGATIVE
UNITS ORDERED: 2

## 2021-04-25 MED ORDER — HEPARIN, PORCINE (PF) 100 UNIT/ML INTRAVENOUS SYRINGE
5.0000 mL | INJECTION | Freq: Once | INTRAVENOUS | Status: AC
Start: 2021-04-25 — End: 2021-04-25
  Administered 2021-04-25: 14:00:00 5 mL
  Filled 2021-04-25: qty 5

## 2021-04-25 MED ORDER — PALONOSETRON 0.25 MG/5 ML INTRAVENOUS SOLUTION
0.2500 mg | Freq: Once | INTRAVENOUS | Status: AC
Start: 2021-04-25 — End: 2021-04-25
  Administered 2021-04-25: 12:00:00 0.25 mg via INTRAVENOUS
  Filled 2021-04-25: qty 5

## 2021-04-25 MED ORDER — SODIUM CHLORIDE 0.9 % (FLUSH) INJECTION SYRINGE
10.0000 mL | INJECTION | Freq: Once | INTRAMUSCULAR | Status: AC
Start: 2021-04-25 — End: 2021-04-25
  Administered 2021-04-25: 14:00:00 10 mL

## 2021-04-25 MED ORDER — SODIUM CHLORIDE 0.9 % INTRAVENOUS SOLUTION
12.0000 mg | Freq: Once | INTRAVENOUS | Status: AC
Start: 2021-04-25 — End: 2021-04-25
  Administered 2021-04-25: 13:00:00 0 mg via INTRAVENOUS
  Administered 2021-04-25: 12:00:00 12 mg via INTRAVENOUS
  Filled 2021-04-25: qty 1.2

## 2021-04-25 MED ORDER — SODIUM CHLORIDE 0.9 % INTRAVENOUS SOLUTION
208.4000 mg | Freq: Once | INTRAVENOUS | Status: AC
Start: 2021-04-25 — End: 2021-04-25
  Administered 2021-04-25: 13:00:00 210 mg via INTRAVENOUS
  Administered 2021-04-25: 14:00:00 0 mg via INTRAVENOUS
  Filled 2021-04-25: qty 21

## 2021-04-25 MED ORDER — SODIUM CHLORIDE 0.9% FLUSH BAG - 250 ML
INTRAVENOUS | Status: DC | PRN
Start: 2021-04-25 — End: 2021-04-26

## 2021-04-25 MED ORDER — SODIUM CHLORIDE 0.9 % INTRAVENOUS SOLUTION
150.0000 mg | Freq: Once | INTRAVENOUS | Status: AC
Start: 2021-04-25 — End: 2021-04-25
  Administered 2021-04-25: 13:00:00 0 mg via INTRAVENOUS
  Administered 2021-04-25: 13:00:00 150 mg via INTRAVENOUS
  Filled 2021-04-25: qty 5

## 2021-04-25 MED ORDER — CYANOCOBALAMIN (VIT B-12) 1,000 MCG/ML INJECTION SOLUTION
1000.0000 ug | Freq: Once | INTRAMUSCULAR | Status: AC
Start: 2021-04-25 — End: 2021-04-25
  Administered 2021-04-25: 13:00:00 1000 ug via SUBCUTANEOUS
  Filled 2021-04-25: qty 1

## 2021-04-25 MED ORDER — SODIUM CHLORIDE 0.9 % INTRAVENOUS SOLUTION
375.0000 mg/m2 | Freq: Once | INTRAVENOUS | Status: DC
Start: 2021-04-25 — End: 2021-04-25

## 2021-04-25 NOTE — Pharmacy (Signed)
Pharmacy Communication    Per Dr. Holley Dexter, no Alimta today due to CrCl < 44mL/min. Will re-evaluate with future cycles.     Continue with carboplatin today.     Valerie Salts, PharmD, [facility]

## 2021-04-25 NOTE — Nurses Notes (Signed)
Patient discharged home with family, to vehicle via WC.  AVS reviewed with patient/care giver.  A written copy of the AVS and discharge instructions was given to the patient/care giver.  Questions sufficiently answered as needed.  Patient/care giver encouraged to follow up with PCP as indicated.  In the event of an emergency, patient/care giver instructed to call 911 or go to the nearest emergency room.

## 2021-04-26 ENCOUNTER — Other Ambulatory Visit: Payer: Self-pay

## 2021-04-26 ENCOUNTER — Inpatient Hospital Stay (HOSPITAL_COMMUNITY)
Admission: RE | Admit: 2021-04-26 | Discharge: 2021-04-26 | Disposition: A | Discharge status: Still In Hospital | Payer: Medicare Other | Source: Ambulatory Visit | Attending: Hematology & Oncology | Admitting: Hematology & Oncology

## 2021-04-26 DIAGNOSIS — D61811 Other drug-induced pancytopenia: Secondary | ICD-10-CM

## 2021-04-26 DIAGNOSIS — C3490 Malignant neoplasm of unspecified part of unspecified bronchus or lung: Secondary | ICD-10-CM

## 2021-04-26 DIAGNOSIS — D708 Other neutropenia: Secondary | ICD-10-CM

## 2021-04-26 MED ORDER — FILGRASTIM-SNDZ 300 MCG/0.5 ML INJECTION SYRINGE
300.0000 ug | INJECTION | Freq: Once | INTRAMUSCULAR | Status: AC
Start: 2021-04-26 — End: 2021-04-26
  Administered 2021-04-26: 15:00:00 300 ug via SUBCUTANEOUS
  Filled 2021-04-26: qty 0.5

## 2021-04-26 NOTE — Nurses Notes (Signed)
Patient discharged home with family.  AVS reviewed with patient/care giver.  A written copy of the AVS and discharge instructions was given to the patient/care giver.  Questions sufficiently answered as needed.  Patient/care giver encouraged to follow up with PCP as indicated.  In the event of an emergency, patient/care giver instructed to call 911 or go to the nearest emergency room.

## 2021-04-27 ENCOUNTER — Other Ambulatory Visit (INDEPENDENT_AMBULATORY_CARE_PROVIDER_SITE_OTHER): Payer: Self-pay | Admitting: Family

## 2021-04-27 ENCOUNTER — Inpatient Hospital Stay (HOSPITAL_COMMUNITY)
Admission: RE | Admit: 2021-04-27 | Discharge: 2021-04-27 | Disposition: A | Discharge status: Still In Hospital | Payer: Medicare Other | Source: Ambulatory Visit | Attending: Hematology & Oncology | Admitting: Hematology & Oncology

## 2021-04-27 DIAGNOSIS — D708 Other neutropenia: Secondary | ICD-10-CM

## 2021-04-27 DIAGNOSIS — D61811 Other drug-induced pancytopenia: Secondary | ICD-10-CM

## 2021-04-27 DIAGNOSIS — C3490 Malignant neoplasm of unspecified part of unspecified bronchus or lung: Secondary | ICD-10-CM

## 2021-04-27 MED ORDER — FILGRASTIM-SNDZ 300 MCG/0.5 ML INJECTION SYRINGE
300.0000 ug | INJECTION | Freq: Once | INTRAMUSCULAR | Status: AC
Start: 2021-04-27 — End: 2021-04-27
  Administered 2021-04-27: 11:00:00 300 ug via SUBCUTANEOUS
  Filled 2021-04-27: qty 0.5

## 2021-04-28 ENCOUNTER — Encounter (HOSPITAL_COMMUNITY): Payer: Self-pay | Admitting: Hematology & Oncology

## 2021-04-28 ENCOUNTER — Inpatient Hospital Stay
Admission: RE | Admit: 2021-04-28 | Discharge: 2021-04-28 | Disposition: A | Discharge status: Still In Hospital | Payer: Medicare Other | Source: Ambulatory Visit | Attending: Hematology & Oncology | Admitting: Hematology & Oncology

## 2021-04-28 DIAGNOSIS — D61811 Other drug-induced pancytopenia: Secondary | ICD-10-CM

## 2021-04-28 DIAGNOSIS — D708 Other neutropenia: Secondary | ICD-10-CM

## 2021-04-28 DIAGNOSIS — C3490 Malignant neoplasm of unspecified part of unspecified bronchus or lung: Secondary | ICD-10-CM

## 2021-04-28 MED ORDER — FILGRASTIM-SNDZ 300 MCG/0.5 ML INJECTION SYRINGE
300.0000 ug | INJECTION | Freq: Once | INTRAMUSCULAR | Status: AC
Start: 2021-04-28 — End: 2021-04-28
  Administered 2021-04-28: 10:00:00 300 ug via SUBCUTANEOUS
  Filled 2021-04-28: qty 0.5

## 2021-04-29 ENCOUNTER — Encounter (INDEPENDENT_AMBULATORY_CARE_PROVIDER_SITE_OTHER): Payer: Self-pay

## 2021-04-29 ENCOUNTER — Encounter (HOSPITAL_COMMUNITY): Payer: Self-pay | Admitting: Hematology & Oncology

## 2021-04-30 ENCOUNTER — Other Ambulatory Visit: Payer: Self-pay

## 2021-04-30 ENCOUNTER — Encounter: Payer: Self-pay | Admitting: Internal Medicine

## 2021-04-30 ENCOUNTER — Ambulatory Visit: Payer: Medicare Other | Admitting: Internal Medicine

## 2021-04-30 ENCOUNTER — Inpatient Hospital Stay (HOSPITAL_COMMUNITY): Admission: RE | Admit: 2021-04-30 | Payer: Medicare Other | Source: Ambulatory Visit

## 2021-04-30 VITALS — BP 140/80 | HR 70 | Ht 67.0 in | Wt 358.0 lb

## 2021-04-30 DIAGNOSIS — E0590 Thyrotoxicosis, unspecified without thyrotoxic crisis or storm: Secondary | ICD-10-CM | POA: Diagnosis not present

## 2021-04-30 DIAGNOSIS — E042 Nontoxic multinodular goiter: Secondary | ICD-10-CM

## 2021-04-30 NOTE — Progress Notes (Signed)
Name: Julia Ware  MRN/ DOB: [id number], January 27, 1956    Age/ Sex: 65 y.o., female     PCP: Andree Moro, DO   Reason for Endocrinology Evaluation: [place]     Initial Endocrinology Clinic Visit: 10/30/2020    PATIENT IDENTIFIER: Julia Ware is a 65 y.o., female with a past medical history of HTN and MNG. She has followed with [facility] since 10/30/2020 for consultative assistance with management of her MNG.   HISTORICAL SUMMARY:   She has an incidental finding of thyroid nodule in CT scan in 2009. This prompted thyroid ultrasound confirming MNG with hx of benign FNA in 2010 ( right inferior ) and 2012 ( right superior ) nodules    She also had a thyroid uptake and scan in 2009 with hyperthyroidism with normal uptake at 16.8%.    MNG stability confirmed over a 5 year period and no serial ultrasound was recommended   No FH of thyroid disease   SUBJECTIVE:     Today (04/30/2021):  Julia Ware is here for a follow up on MNG and subclinical hyperthyroidism.    She denies weight loss  She denies local neck symptoms  She saw her PCP last week and was told TFT's are abnormal , no records available  Has noted palpitations for the past month  Denies diarrhea but has loose stools  Denies local neck symptoms     HISTORY:  Past Medical History:  Past Medical History:  Diagnosis Date   Hepatitis C    Hypertension    Hyperthyroidism    Thyroid nodule    Past Surgical History:  Past Surgical History:  Procedure Laterality Date   COLONOSCOPY WITH PROPOFOL N/A 12/28/2020   Procedure: COLONOSCOPY WITH PROPOFOL;  Surgeon: Carol Ada, MD;  Location: [facility];  Service: Endoscopy;  Laterality: N/A;   POLYPECTOMY  12/28/2020   Procedure: POLYPECTOMY;  Surgeon: Carol Ada, MD;  Location: [facility];  Service: Endoscopy;;   Social History:  reports that she has never smoked. She has never used smokeless tobacco. She reports current alcohol use. She  reports that she does not use drugs. Family History:  Family History  Problem Relation Age of Onset   Diabetes Mother    Hypertension Mother    Kidney disease Father    Hypertension Father    Hyperlipidemia Father      HOME MEDICATIONS: Allergies as of 04/30/2021   No Known Allergies      Medication List        Accurate as of April 30, 2021  8:33 AM. If you have any questions, ask your nurse or doctor.          acetaminophen 500 MG tablet Commonly known as: TYLENOL Take 1,000 mg by mouth every 6 (six) hours as needed (pain).   carvedilol 6.25 MG tablet Commonly known as: COREG Take 6.25 mg by mouth 2 (two) times daily.   lisinopril-hydrochlorothiazide 20-12.5 MG tablet Commonly known as: ZESTORETIC Take 1 tablet by mouth in the morning.          OBJECTIVE:   PHYSICAL EXAM: VS: BP 140/80 (BP Location: Left Wrist, Patient Position: Sitting, Cuff Size: Large)   Pulse 70   Ht 5\' 7"  (1.702 m)   Wt (!) 358 lb (162.4 kg)   SpO2 98%   BMI 56.07 kg/m    EXAM: General: Pt appears well and is in NAD  Neck: General: Supple without adenopathy. Thyroid: Right nodule appreciated. No thyroid bruit  Lungs: Clear with good BS bilat with no rales, rhonchi, or wheezes  Heart: Auscultation: RRR.  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 Common causes of subclinical hyperthyroidism include autonomously functioning thyroid adenomas and multinodular goiters , or Graves' disease   - Most patients with subclinical hyperthyroidism have no clinical manifestations of hyperthyroidism, and those symptoms that are present (eg, tachycardia, tremor, dyspnea on exertion, weight loss) are mild and nonspecific." However, subclinical hyperthyroidism is associated with an increased risk of atrial fibrillation and, primarily in postmenopausal women, a decrease in bone mineral density.   - We discussed with pt the benefits of methimazole in the Tx of hyperthyroidism, as well as the possible side effects/complications of anti-thyroid drug Tx (specifically detailing the rare, but serious side effect of agranulocytosis). She was informed of need for regular thyroid function monitoring while on methimazole to ensure appropriate dosage without over-treatment. As well, we discussed the possible side effects of methimazole including the chance of rash, the small chance of liver irritation/juandice and the <=1 in 300-400 chance of sudden onset agranulocytosis.  We discussed importance of going to ED promptly (and stopping methimazole) if shewere to develop significant fever with severe sore throat of other evidence of acute infection.      We extensively discussed the various treatment options for hyperthyroidism and Graves disease including ablation therapy with radioactive iodine versus antithyroid drug treatment versus surgical therapy.  We discussed the various possible benefits versus side effects of the various therapies.   I carefully explained to the patient that one of the consequences of I-131 ablation treatment would likely be permanent  hypothyroidism which would require long-term replacement therapy with LT4.   - TFTs' received and TSH stable , will continue to monitor    2. MNG:    - I have reviewed her ultrasound results as well previous FNA results with benign cytology  - Reassurance has been provided as these nodules have not increased in size in over 5 yrs.  - No serial thyroid ultrasound are indicated unless there's a clinical concern about change is size - I explained to her that if she starts experiencing local neck symptoms, total thyroidectomy would be recommended      F/U in 4 months    Signed electronically by: Mack Guise, MD  [facility]  [facility] [address] [place], [place] [address] Phone: [phone] FAX: [phone]      CC: Andree Moro, [place] [address] Phone: [phone]  Fax: [phone]   Return to Endocrinology clinic as below: Future Appointments  Date Time Provider [place]  09/03/2021  7:50 AM France Noyce, Melanie Crazier, MD [facility]

## 2021-05-02 ENCOUNTER — Ambulatory Visit (INDEPENDENT_AMBULATORY_CARE_PROVIDER_SITE_OTHER): Payer: Medicare Other | Admitting: Orthopaedic Surgery

## 2021-05-02 ENCOUNTER — Ambulatory Visit (INDEPENDENT_AMBULATORY_CARE_PROVIDER_SITE_OTHER): Payer: Medicare Other

## 2021-05-02 ENCOUNTER — Encounter (INDEPENDENT_AMBULATORY_CARE_PROVIDER_SITE_OTHER): Payer: Self-pay | Admitting: Orthopaedic Surgery

## 2021-05-02 ENCOUNTER — Telehealth: Payer: Self-pay | Admitting: Internal Medicine

## 2021-05-02 VITALS — HR 74 | Resp 18 | Ht 61.0 in | Wt 104.0 lb

## 2021-05-02 DIAGNOSIS — C7951 Secondary malignant neoplasm of bone: Secondary | ICD-10-CM

## 2021-05-02 DIAGNOSIS — C801 Malignant (primary) neoplasm, unspecified: Secondary | ICD-10-CM

## 2021-05-02 DIAGNOSIS — S3282XD Multiple fractures of pelvis without disruption of pelvic ring, subsequent encounter for fracture with routine healing: Secondary | ICD-10-CM

## 2021-05-02 NOTE — Progress Notes (Signed)
Melody Hendricks                           [birth year]               [id number]     05/02/2021 here following up for metastatic CA overall is doing well complains of no new pain and activity has maintained    Physical Examination:      Visit Vitals  Pulse 74   Resp 18   Ht 1.549 m (5\' 1" )   Wt 47.2 kg (104 lb)   BMI 19.65 kg/m      Unchanged    X-ray Findings:XR Pelvis Limited 1 Or 2 Views  AP pelvis does show sclerotic lesions involving both acetabulum there is   also a lytic lesion involving the subtrochanteric area of the left thigh   all of which are unchanged from previous x-rays        Diagnosis:  1. Multiple closed fractures of pelvis without disruption of pelvic ring with routine healing, subsequent encounter  XR Pelvis Limited 1 Or 2 Views      2. 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               Plan: I told her and her husband overall everything looks good the the only area that I have been concerned with and has not been changed is the area on her left femur I did tell her if she starts having pain in this area definitely let us know because I would be concerned about a pathologic fracture otherwise we will see her back on a as needed basis      Electronically signed and finalized by:    Raliegh Scarlet., DO  05/02/2021 5:09 PM     Although significant efforts were made to ensure accuracy of spelling and grammar, today's note was completed in large part by speech/voice recognition [employer]) software and by keyboarding information into the electronic health care record in real time.  As such, there may be errors in grammar and spelling, insertion of incorrect words/phrases, pronoun errors, etc., that should be disregarded when reviewing this note.

## 2021-05-02 NOTE — Telephone Encounter (Signed)
Please let the patient know that I did receive her thyroid test results from her primary care physician.   Please let the patient know that her thyroid test is stable and it has not changed much from the last time.  No indication to treat at this time but we will continue to monitor and intervene when necessary   Thanks

## 2021-05-03 NOTE — Telephone Encounter (Signed)
Patient notified and verbalized understanding. 

## 2021-05-13 ENCOUNTER — Encounter (INDEPENDENT_AMBULATORY_CARE_PROVIDER_SITE_OTHER): Payer: Self-pay

## 2021-05-15 ENCOUNTER — Telehealth (HOSPITAL_COMMUNITY): Payer: Self-pay

## 2021-05-15 ENCOUNTER — Other Ambulatory Visit (HOSPITAL_COMMUNITY): Payer: Self-pay | Admitting: Hematology & Oncology

## 2021-05-15 DIAGNOSIS — J40 Bronchitis, not specified as acute or chronic: Secondary | ICD-10-CM

## 2021-05-15 MED ORDER — AZITHROMYCIN 250 MG TABLET
ORAL_TABLET | ORAL | 0 refills | Status: AC
Start: 2021-05-15 — End: ?

## 2021-05-15 NOTE — Nursing Note (Signed)
Received a call from patients daughter in law stating that patient has been experiencing lethargy, productive cough, decreased appetite and fevers (100.0-102) since Sunday.  Patient has been taking tylenol for fevers. Daughter in law states that she is concerned about her decline.  This nurse spoke to Dr. Holley Dexter, who states that treatment tomorrow will be cancelled and he will reach out to the patient and family today to discuss.

## 2021-05-16 ENCOUNTER — Inpatient Hospital Stay (HOSPITAL_COMMUNITY): Admission: RE | Admit: 2021-05-16 | Payer: Medicare Other | Source: Ambulatory Visit

## 2021-05-17 ENCOUNTER — Encounter (INDEPENDENT_AMBULATORY_CARE_PROVIDER_SITE_OTHER): Payer: Self-pay

## 2021-05-17 ENCOUNTER — Encounter (HOSPITAL_COMMUNITY): Payer: Self-pay | Admitting: Hematology & Oncology

## 2021-05-17 ENCOUNTER — Ambulatory Visit: Payer: Medicare Other | Attending: Hematology & Oncology | Admitting: Hematology & Oncology

## 2021-05-17 ENCOUNTER — Inpatient Hospital Stay (HOSPITAL_BASED_OUTPATIENT_CLINIC_OR_DEPARTMENT_OTHER)
Admission: RE | Admit: 2021-05-17 | Discharge: 2021-05-17 | Disposition: A | Discharge status: Home / Self Care | Payer: Medicare Other | Source: Ambulatory Visit | Attending: Hematology & Oncology | Admitting: Hematology & Oncology

## 2021-05-17 ENCOUNTER — Other Ambulatory Visit: Payer: Self-pay

## 2021-05-17 ENCOUNTER — Ambulatory Visit (HOSPITAL_COMMUNITY): Payer: Self-pay

## 2021-05-17 VITALS — BP 104/67 | HR 101 | Temp 98.2°F | Resp 18 | Ht 60.98 in | Wt 107.6 lb

## 2021-05-17 DIAGNOSIS — Z7901 Long term (current) use of anticoagulants: Secondary | ICD-10-CM | POA: Insufficient documentation

## 2021-05-17 DIAGNOSIS — C3411 Malignant neoplasm of upper lobe, right bronchus or lung: Secondary | ICD-10-CM

## 2021-05-17 DIAGNOSIS — R5381 Other malaise: Secondary | ICD-10-CM

## 2021-05-17 DIAGNOSIS — R627 Adult failure to thrive: Secondary | ICD-10-CM | POA: Insufficient documentation

## 2021-05-17 DIAGNOSIS — C3490 Malignant neoplasm of unspecified part of unspecified bronchus or lung: Secondary | ICD-10-CM | POA: Insufficient documentation

## 2021-05-17 DIAGNOSIS — Z87891 Personal history of nicotine dependence: Secondary | ICD-10-CM | POA: Insufficient documentation

## 2021-05-17 DIAGNOSIS — J40 Bronchitis, not specified as acute or chronic: Secondary | ICD-10-CM | POA: Insufficient documentation

## 2021-05-17 DIAGNOSIS — C7951 Secondary malignant neoplasm of bone: Secondary | ICD-10-CM | POA: Insufficient documentation

## 2021-05-17 DIAGNOSIS — C801 Malignant (primary) neoplasm, unspecified: Secondary | ICD-10-CM

## 2021-05-17 NOTE — Progress Notes (Signed)
Return Patient Progress Note    Date: 05/17/2021    Name: Gina Barrett  MRN: [id number]  DOB: 11-26-1955   Referring Physician: Self, Referral  Primary Care Provider: Lanier Ensign, FNP-C    Reason for visit/consultation Lung Cancer      Interval History:   Gina Barrett returns to clinic today with her husband and daughter in-law Gina Barrett for follow-up.  I recently started her on Zithromax for presumed bronchitis and she is now day 3 of this regimen.  She still feels tired and exhausted that is doing slightly better than she was 3 days ago.  She has been sleeping a lot according to husband.  Her appetite is decreased and she also has nonspecific confusion and memory issues.  Patient admits to progressive fatigue and weakness.  She is not in pain and has no specific complaint except for weakness.  She does feel better after taking Tylenol for her low-grade fever.    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  -PET-CT scan on 09/06/2016 showed 10 mm right upper lobe FDG avid lesion that is slightly larger than 2017 CT and concerning for malignancy.  A separate 3 cm long area of tracer uptake in the left lower lobe was noted.  -CT-guided biopsy a [facility] on 10/02/2016 was positive for adenocarcinoma that is well to moderately differentiated.  -Mediastinoscopy 11/13/2016 positive for left mid paratrachael    Stage: IV, initially diagnosed as IIIB     Molecular/special studies:  ROS-1, ALK-1, EGFR, PDL1 all negative    Treatment & Surveillance:   1. S/p  chemoradiation with carboplatin + paclitaxel + RT 66.6 Gy completed 02/10/19/18    2. Serial imaging since completion of therapy has been negative for recurrence.  Most recent imaging in on 12/03/2018 was also negative but showed a new T6 compression abnormality.     3. Chest CT with contrast from 09/23/2019 which in summary showed increasing pleural based density in the LLL which may be infectious or inflammatory.  There is increased 1.7cm LN in left hilum ans stable compression abnormality of T6.    4. Biopsy of left LL pleural bases mass on 11/02/2019 showed benign fibrovascular tissue with focal chronic inflammation.  no lung parechyma or malgnancy identified.    5. Patient developed progressive musculoskeletal pain around October /November of 2021 evaluation with multiple imaging studies highly suggest metastatic disease.  Evaluation for plasma cell was noncontributory.  Patient was referred to orthopedic surgery (Dr. Hervey Ard, by family request and known to patient)  for surgical recommendation especially regarding weight-bearing.    6. Biopsy of paraspinal soft tissue mass on 07/04/2020 confirmed metastatic carcinoma consistent with lung primary. CARIS NGS negative    7.Radiation Oncology for radiation to the acetabulum as well as the paraspinal mass started 07/19/2020 x 10 , ending 07/31/2020    8. Mediport placed  07/19/2020    9.Begin Zoledronic acid.  Has dentures and no dental eval needed    10. Brain MRI planne2/12/2020     11.  Begin palliative carboplatin & pemetrexed on 08/03/2020       ________________________________________________________________________________________________________________________________________________  Objective   Objective:   BP 104/67 (Site: Left, Patient Position: Sitting)    Pulse (!) 101    Temp 36.8 C (98.2 F) (Thermal Scan)    Resp 18    Ht 1.549 m (5' 0.98") Comment: *   Wt 48.8 kg (107 lb 9.6 oz)    BMI 20.34 kg/m       ECOG Status: 2 - Ambulatory and capable of all  selfcare, but unable to carry out any work activities.  Up and about more than 50% of waking hours.    Physical Exam     Past Medical History:   Diagnosis Date    Abnormal Pap smear     ASCUS, ASCUS cannot rule out HGSIL    Cancer (CMS HCC)     non small cell lung cancer (R)-s/p Chemo/Radiation completed 8/18    Colon polyp     Dysphagia     Hypothyroid     Non-small cell lung cancer, right (CMS HCC)     s/p Chemo completed 9/18    Raynaud's disease     STD (sexually transmitted disease)     HSV    Unspecified disorder of thyroid     Hypothyroidism         Current Outpatient Medications   Medication Sig    azithromycin (ZITHROMAX) 250 mg Oral Tablet Take 500 mg (2 tab) on day 1; take 250 mg (1 tab) on days 2-5. Indications: bronchitis/pneumonia    cholecalciferol, vitamin D3, 25 mcg (1,000 unit) Oral Tablet Take 2 Tablets (2,000 Units total) by mouth Once a day    clobetasoL-emollient no.65 0.05 % Apply externally Combo Pack Apply topically Twice daily To buttock    clotrimazole (LOTRIMIN) 1 % Cream Apply topically    Desoximetasone (TOPICORT) 0.25 % Cream Apply topically Twice daily To Ear    levothyroxine (SYNTHROID) 88 mcg Oral Tablet TAKE ONE TABLET BY MOUTH EVERY DAY    loratadine (CLARITIN) 10 mg Oral Tablet take 10 mg by mouth Once a day.    megestroL (MEGACE) 40 mg Oral Tablet Take 40 mg by mouth    mirtazapine (REMERON) 15 mg Oral Tablet TAKE ONE TABLET BY MOUTH EVERY EVENING    MULTIVITAMIN ORAL Take by mouth Once a day Centrum Silver Women's 50+    oxyCODONE-acetaminophen (PERCOCET) 10-325 mg Oral tablet TAKE ONE TABLET BY MOUTH EVERY 6 HOURS AS NEEDED FOR PAIN    rivaroxaban (XARELTO) 20 mg Oral Tablet Take 1 Tablet (20 mg total) by mouth Every evening with dinner Indications: blood clot in a deep vein of the extremities    SENNA PLUS 8.6-50 mg Oral Tablet TAKE ONE TABLET BY MOUTH EVERY EVENING     Allergies as of 05/17/2021 - Reviewed 05/17/2021   Allergen Reaction Noted     Penicillins  08/15/2010       Social History  Occupation:   Social History     Occupational History     Employer: [employer] [place] [address]   reports that she quit smoking about 22 years ago. Her smoking use included cigarettes. She started smoking about 50 years ago. She has never used smokeless tobacco. She reports being sexually active and has had partner(s) who are female. She reports using the following method of birth control/protection: None. She reports that she does not drink alcohol and does not use drugs.    Labs:  Labs reviewed and interpreted:  No results found for this or any  previous visit (from the past 96 hour(s)).    Radiology: All recent pertinent radiologic images and reports reviewed          Assessment/Plan:     1. Malignant neoplasm of lung, unspecified laterality, unspecified part of lung (CMS [place])  65 year old woman with metastatic non-small-cell lung cancer initially diagnosed over 4 years ago with recurrence 10 months ago.  Patient has continued to decline and can no longer tolerate palliative chemotherapy.  She has now significantly improved in a continuous fashion over the past 10 months.  I therefore had a long discussion with patient and her family and recommended proceeding to hospice.  She is already on palliative care through community palliative care.  She met with a Education officer, museum today who will guide and arrange for her transition to hospice.  Why this was a difficult conversation, patient, her husband, and has supportive daughter-in-law I in full agreement.  I do not think that getting imaging studies of her brain to evaluate for brain metastasis regarding her apparent intermittent confusion is going to changed to direction of her decline.    I guided the family and the patient through expectations over the next few weeks to months.  I informed them that she will steadily get weaker, unable to eat or drink, unable to get out of bed and that these issues  should not be forced.  It will be best at this time to have her do as much as she can and spend as much time as she can with her family and friends.  I discussed resuscitation status and there was consensus that patient is DNI and DNR.  She does have advanced care on record but is also interested in apparently meeting with an attorney for an apparent will or revisional will.  Recommendations was provided to them by Judson Roch.    Regarding her medications, she may continue Xarelto as long as she can swallow but can stop or change to Coumadin if this becomes a barrier to hospice admission.  She may continue her Synthroid, Remeron although the latter can be stopped.  She will continue senna as needed as well as her Percocet.    2. 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 FTT (failure to thrive) in adult  -per #1 above    4. Physical deconditioning  -per #1 above      Return if symptoms worsen or fail to improve.    Mertie Moores, MD        CC:  PCP General:  Lanier Ensign, FNP-C  [address]  [place] [address]      Portions of this note may be dictated using voice recognition software or a dictation service. Variances in spelling and vocabulary are possible and unintentional. Not all errors are caught/corrected. Please notify the [facility] if any discrepancies are noted or if the meaning of any statement is not clear.

## 2021-05-20 ENCOUNTER — Encounter (HOSPITAL_COMMUNITY): Payer: Self-pay

## 2021-05-20 NOTE — Progress Notes (Signed)
SW Note: Faxed referral to [facility].  Cheree Ditto, SOCIAL WORKER

## 2021-05-30 ENCOUNTER — Encounter (HOSPITAL_COMMUNITY): Payer: Self-pay | Admitting: Internal Medicine

## 2021-06-19 ENCOUNTER — Other Ambulatory Visit: Payer: Self-pay | Admitting: Registered Nurse

## 2021-06-19 DIAGNOSIS — E2839 Other primary ovarian failure: Secondary | ICD-10-CM

## 2021-06-19 DIAGNOSIS — Z780 Asymptomatic menopausal state: Secondary | ICD-10-CM

## 2021-07-22 ENCOUNTER — Encounter (HOSPITAL_COMMUNITY): Payer: Self-pay | Admitting: Internal Medicine

## 2021-07-24 ENCOUNTER — Telehealth (INDEPENDENT_AMBULATORY_CARE_PROVIDER_SITE_OTHER): Payer: Self-pay | Admitting: Family

## 2021-07-24 ENCOUNTER — Telehealth (INDEPENDENT_AMBULATORY_CARE_PROVIDER_SITE_OTHER): Payer: Self-pay

## 2021-07-24 ENCOUNTER — Other Ambulatory Visit: Payer: Self-pay | Admitting: Registered Nurse

## 2021-07-24 NOTE — Telephone Encounter (Signed)
Patient's husband called in stating that patient passed away on July 24, 2021

## 2021-07-24 NOTE — Telephone Encounter (Signed)
Pts husband came in to advise Melody Hendricks that Pt had passed away on 2/5 and wanted to make sure that her appt in April was cancelled.

## 2021-08-14 DEATH — deceased

## 2021-09-03 ENCOUNTER — Ambulatory Visit: Payer: Medicare Other | Admitting: Internal Medicine

## 2021-09-03 ENCOUNTER — Encounter: Payer: Self-pay | Admitting: Internal Medicine

## 2021-09-03 ENCOUNTER — Other Ambulatory Visit: Payer: Self-pay

## 2021-09-03 VITALS — BP 134/78 | HR 76 | Ht 67.0 in | Wt 361.0 lb

## 2021-09-03 DIAGNOSIS — E042 Nontoxic multinodular goiter: Secondary | ICD-10-CM | POA: Diagnosis not present

## 2021-09-03 DIAGNOSIS — E0590 Thyrotoxicosis, unspecified without thyrotoxic crisis or storm: Secondary | ICD-10-CM | POA: Diagnosis not present

## 2021-09-03 LAB — CBC WITH DIFFERENTIAL/PLATELET
Basophils Absolute: 0 10*3/uL (ref 0.0–0.1)
Basophils Relative: 0.4 % (ref 0.0–3.0)
Eosinophils Absolute: 0.1 10*3/uL (ref 0.0–0.7)
Eosinophils Relative: 0.8 % (ref 0.0–5.0)
HCT: 39.6 % (ref 36.0–46.0)
Hemoglobin: 12.8 g/dL (ref 12.0–15.0)
Lymphocytes Relative: 33.1 % (ref 12.0–46.0)
Lymphs Abs: 2.2 10*3/uL (ref 0.7–4.0)
MCHC: 32.3 g/dL (ref 30.0–36.0)
MCV: 77.3 fl — ABNORMAL LOW (ref 78.0–100.0)
Monocytes Absolute: 0.4 10*3/uL (ref 0.1–1.0)
Monocytes Relative: 6.7 % (ref 3.0–12.0)
Neutro Abs: 3.9 10*3/uL (ref 1.4–7.7)
Neutrophils Relative %: 59 % (ref 43.0–77.0)
Platelets: 208 10*3/uL (ref 150.0–400.0)
RBC: 5.12 Mil/uL — ABNORMAL HIGH (ref 3.87–5.11)
RDW: 14.7 % (ref 11.5–15.5)
WBC: 6.6 10*3/uL (ref 4.0–10.5)

## 2021-09-03 LAB — COMPREHENSIVE METABOLIC PANEL
ALT: 13 U/L (ref 0–35)
AST: 14 U/L (ref 0–37)
Albumin: 3.9 g/dL (ref 3.5–5.2)
Alkaline Phosphatase: 55 U/L (ref 39–117)
BUN: 15 mg/dL (ref 6–23)
CO2: 30 mEq/L (ref 19–32)
Calcium: 9.2 mg/dL (ref 8.4–10.5)
Chloride: 104 mEq/L (ref 96–112)
Creatinine, Ser: 0.97 mg/dL (ref 0.40–1.20)
GFR: 60.7 mL/min (ref >60.00)
Glucose, Bld: 95 mg/dL (ref 70–99)
Potassium: 4.5 mEq/L (ref 3.5–5.1)
Sodium: 141 mEq/L (ref 135–145)
Total Bilirubin: 0.4 mg/dL (ref 0.2–1.2)
Total Protein: 7.7 g/dL (ref 6.0–8.3)

## 2021-09-03 LAB — TSH: TSH: 0.45 u[IU]/mL (ref 0.35–5.50)

## 2021-09-03 LAB — T4, FREE: Free T4: 1.16 ng/dL (ref 0.60–1.60)

## 2021-09-03 NOTE — Progress Notes (Signed)
? ?Name: Julia Ware  ?MRN/ DOB: [id number], 26-Mar-1956    ?Age/ Sex: 66 y.o., female   ? ? ?PCP: Andree Moro, DO   ?Reason for Endocrinology Evaluation: MNG  ?   ?Initial Endocrinology Clinic Visit: 10/30/2020  ? ? ?PATIENT IDENTIFIER: Julia Ware is a 66 y.o., female with a past medical history of HTN and MNG. She has followed with [facility] since 10/30/2020 for consultative assistance with management of her MNG.  ? ?HISTORICAL SUMMARY:  ? ?She has an incidental finding of thyroid nodule in CT scan in 2009. This prompted thyroid ultrasound confirming MNG with hx of benign FNA in 2010 ( right inferior ) and 2012 ( right superior ) nodules  ?  ?She also had a thyroid uptake and scan in 2009 with hyperthyroidism with normal uptake at 16.8%.  ? ? ?MNG stability confirmed over a 5 year period and no serial ultrasound was recommended ? ? ?No FH of thyroid disease  ? ?SUBJECTIVE:  ? ? ? ?Today (09/03/2021):  Julia Ware is here for a follow up on MNG and subclinical hyperthyroidism.  ? ?Weight has been stable  ?Denies diarrhea or loose stools  ?She denies local neck symptoms  ?Has occasional palpitations  ?Denies local neck symptoms  ?Has  heat intolerance at night  ?Denies anxiety  ?She has right knee OA  ?She was treated for Hep C through [facility]  ? ? ? ?HISTORY:  ?Past Medical History:  ?Past Medical History:  ?Diagnosis Date  ? Hepatitis C   ? Hypertension   ? Hyperthyroidism   ? Thyroid nodule   ? ?Past Surgical History:  ?Past Surgical History:  ?Procedure Laterality Date  ? COLONOSCOPY WITH PROPOFOL N/A 12/28/2020  ? Procedure: COLONOSCOPY WITH PROPOFOL;  Surgeon: Carol Ada, MD;  Location: [facility];  Service: Endoscopy;  Laterality: N/A;  ? POLYPECTOMY  12/28/2020  ? Procedure: POLYPECTOMY;  Surgeon: Carol Ada, MD;  Location: [facility];  Service: Endoscopy;;  ? ?Social History:  reports that she has never smoked. She has never used smokeless tobacco. She reports current  alcohol use. She reports that she does not use drugs. ?Family History:  ?Family History  ?Problem Relation Age of Onset  ? Diabetes Mother   ? Hypertension Mother   ? Kidney disease Father   ? Hypertension Father   ? Hyperlipidemia Father   ? ? ? ?HOME MEDICATIONS: ?Allergies as of 09/03/2021   ?No Known Allergies ?  ? ?  ?Medication List  ?  ? ?  ? Accurate as of September 03, 2021  9:07 AM. If you have any questions, ask your nurse or doctor.  ?  ?  ? ?  ? ?acetaminophen 500 MG tablet ?Commonly known as: TYLENOL ?Take 1,000 mg by mouth every 6 (six) hours as needed (pain). ?  ?amoxicillin 500 MG tablet ?Commonly known as: AMOXIL ?Take 500 mg by mouth 4 (four) times daily. ?  ?carvedilol 6.25 MG tablet ?Commonly known as: COREG ?Take 6.25 mg by mouth 2 (two) times daily. ?  ?ibuprofen 800 MG tablet ?Commonly known as: ADVIL ?Take 800 mg by mouth 3 (three) times daily. ?  ?lisinopril-hydrochlorothiazide 20-12.5 MG tablet ?Commonly known as: ZESTORETIC ?Take 1 tablet by mouth in the morning. ?  ? ?  ? ? ? ? ?OBJECTIVE:  ? ?PHYSICAL EXAM: ?VS: BP 134/78 (BP Location: Left Arm, Patient Position: Sitting, Cuff Size: Large)   Pulse 76   Ht '5\' 7"'$  (1.702 m)  Wt (!) 361 lb (163.7 kg)   SpO2 97%   BMI 56.54 kg/m?   ? ?EXAM: ?General: Pt appears well and is in NAD  ?Neck: General: Supple without adenopathy. ?Thyroid: Right nodule appreciated. No thyroid bruit  ?Lungs: Clear with good BS bilat with no rales, rhonchi, or wheezes  ?Heart: Auscultation: RRR.  ?Abdomen: Normoactive bowel sounds, soft, nontender, without masses or organomegaly palpable  ?Extremities:  ?BL LE: Trace  pretibial edema   ?Mental Status: Judgment, insight: Intact ?Orientation: Oriented to time, place, and person ?Mood and affect: No depression, anxiety, or agitation  ? ? ? ?DATA REVIEWED: ? ? Latest Reference Range & Units 09/03/21 09:14  ?Sodium 135 - 145 mEq/L 141  ?Potassium 3.5 - 5.1 mEq/L 4.5  ?Chloride 96 - 112 mEq/L 104  ?CO2 19 - 32 mEq/L 30   ?Glucose 70 - 99 mg/dL 95  ?BUN 6 - 23 mg/dL 15  ?Creatinine 0.40 - 1.20 mg/dL 0.97  ?Calcium 8.4 - 10.5 mg/dL 9.2  ?Alkaline Phosphatase 39 - 117 U/L 55  ?Albumin 3.5 - 5.2 g/dL 3.9  ?AST 0 - 37 U/L 14  ?ALT 0 - 35 U/L 13  ?Total Protein 6.0 - 8.3 g/dL 7.7  ?Total Bilirubin 0.2 - 1.2 mg/dL 0.4  ?GFR >60.00 mL/min 60.70  ?WBC 4.0 - 10.5 K/uL 6.6  ?RBC 3.87 - 5.11 Mil/uL 5.12 (H)  ?Hemoglobin 12.0 - 15.0 g/dL 12.8  ?HCT 36.0 - 46.0 % 39.6  ?MCV 78.0 - 100.0 fl 77.3 (L)  ?MCHC 30.0 - 36.0 g/dL 32.3  ?RDW 11.5 - 15.5 % 14.7  ?Platelets 150.0 - 400.0 K/uL 208.0  ?Neutrophils 43.0 - 77.0 % 59.0  ?Lymphocytes 12.0 - 46.0 % 33.1  ?Monocytes Relative 3.0 - 12.0 % 6.7  ?Eosinophil 0.0 - 5.0 % 0.8  ?Basophil 0.0 - 3.0 % 0.4  ?NEUT# 1.4 - 7.7 K/uL 3.9  ?Lymphocyte # 0.7 - 4.0 K/uL 2.2  ?Monocyte # 0.1 - 1.0 K/uL 0.4  ?Eosinophils Absolute 0.0 - 0.7 K/uL 0.1  ?Basophils Absolute 0.0 - 0.1 K/uL 0.0  ?TSH 0.35 - 5.50 uIU/mL 0.45  ?T4,Free(Direct) 0.60 - 1.60 ng/dL 1.16  ? ? ? ?04/18/2021 ?TSH 0.21 uIU/mL ?FT4 1.4 ng/dL ? ? ?FNA right superior 3.1 cm 04/23/2011: Benign  ?  ?  ?  ?FNA right lower pole nodule 4 cm 2/9/20210: benign  ?  ? ?Thyroid Ultrasound 08/20/2020 ? ?The previously biopsied approximately 3.7 x 3.0 x 2.2 cm ?spongiform/benign-appearing mass involving the superior aspect the ?right lobe of the thyroid (labeled 1), is unchanged compared to the ?04/23/2011 examination, previously, 4.6 x 3.0 x 2.2 cm. Correlation ?with previous biopsy results is advised. ?  ?Previously noted 4.6 cm isoechoic ill-defined nodule involving the ?mid, posterior aspect the right lobe of the thyroid is not ?definitely seen on the present examination and thus may have ?represented a pseudonodule in the setting of multinodular goiter. ?  ?The approximately 2.2 x 2.1 x 1.5 cm isoechoic nodule within the ?inferior medial aspect of the right lobe of the thyroid (labeled 3), ?is unchanged compared to the 08/2013 examination, previously, 2.4 x ?2.1 x  1.3 cm. Stability for greater than 5 years is indicative ?benign etiology. ?  ?_________________________________________________________ ?  ?The approximately 1.3 cm partially cystic though predominantly solid ?nodule within the inferior pole of the left lobe of the thyroid ?(labeled 4) is grossly unchanged compared to the 2015 examination, ?previously, 1.9 cm. Imaging stability for greater than 5 years is ?indicative of a  benign etiology. ?  ?The approximately 2.0 cm spongiform/benign-appearing nodule within ?mid aspect the left lobe of the thyroid (labeled 5) is grossly ?unchanged compared to the 2015 examination, previously, 2.1 cm. ?Imaging stability for greater than 5 years is indicative of benign ?etiology. ?  ?IMPRESSION: ?1. 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 No ?worrisome new or enlarging thyroid nodules. ?2. Previously biopsied dominant spongiform/benign-appearing ?right-sided thyroid nodule/mass is grossly unchanged compared to the ?20/12 examination. Correlation with previous biopsy results is ?advised. Additionally, imaging stability for greater than 5 years is ?indicative of a benign etiology. ?3. The remaining thyroid nodules are grossly unchanged since at ?least the 2015 examination. Imaging stability for greater than 5 ?years is indicative of a benign etiology. ?  ? ? ?  ? ?ASSESSMENT / PLAN / RECOMMENDATIONS:  ? ?Subclinical Hyperthyroidism: ? 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 ? ? ?2. MNG:  ? ? ?- Reassurance has been provided as these nodules have not increased in size in over 5 yrs.  ?- No serial thyroid ultrasound are indicated unless there's a clinical concern about change is size ? ? ? ?F/U in 6 months  ? ? ?Signed  electronically by: ?Abby Nena Jordan, MD ? ?[facility]  ?[facility] ?[address] ?[place], [place] [address] ?Phone: [phone] ?FAX: [phone]  ? ? ? ? ?CC: ?Posey Pronto, Delane Ginger

## 2021-09-05 LAB — T3: T3, Total: 139 ng/dL (ref 76–181)

## 2021-09-05 LAB — TRAB (TSH RECEPTOR BINDING ANTIBODY): TRAB: <1 IU/L (ref <=2.00)

## 2021-09-25 ENCOUNTER — Ambulatory Visit (INDEPENDENT_AMBULATORY_CARE_PROVIDER_SITE_OTHER): Payer: Medicare Other | Admitting: Family

## 2021-11-28 IMAGING — US US THYROID
1 series · 12 of 25 positions shown · non-contrast
Comparison: 08/22/2013;

CLINICAL DATA: Prior ultrasound follow-up. Follow-up multinodular
goiter. Remote history of right-sided thyroid nodule fine-needle
aspiration performed 04/23/2011 as well as 07/25/2008.

EXAM:
THYROID ULTRASOUND
TECHNIQUE: Ultrasound examination of the thyroid gland and adjacent soft
tissues was performed.

[Series 1: us thyroid · 0.09mm/px · 12 of 64 slices shown]
[im 3/64]
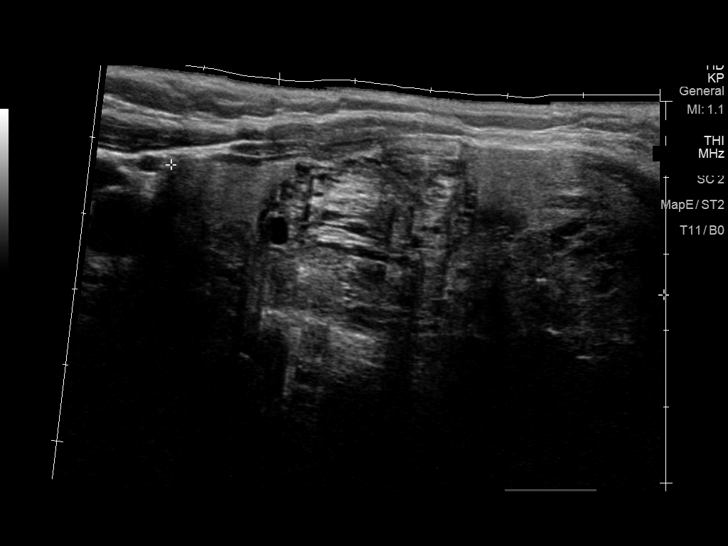
[im 8/64]
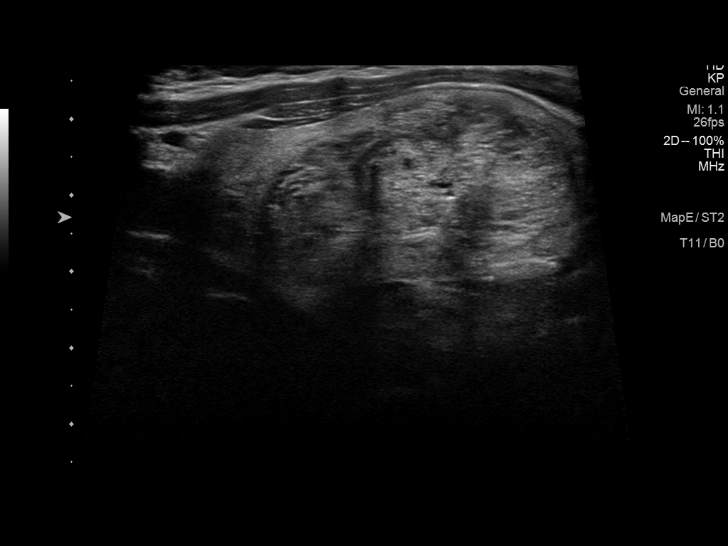
[im 14/64]
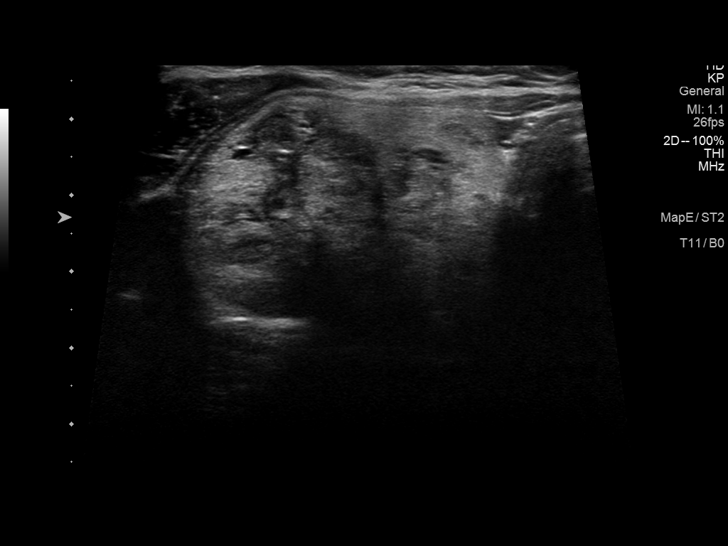
[im 19/64]
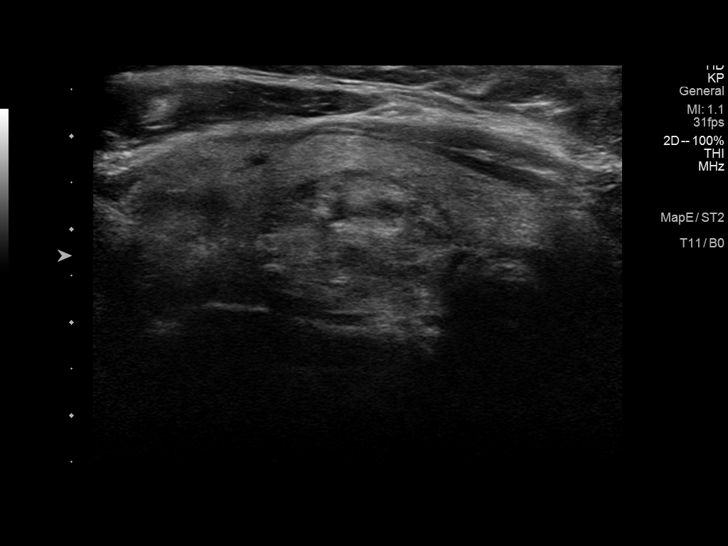
[im 24/64]
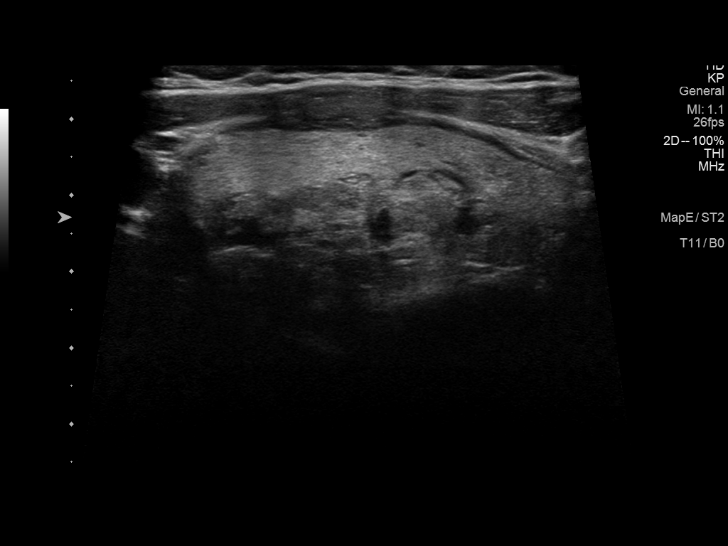
[im 29/64]
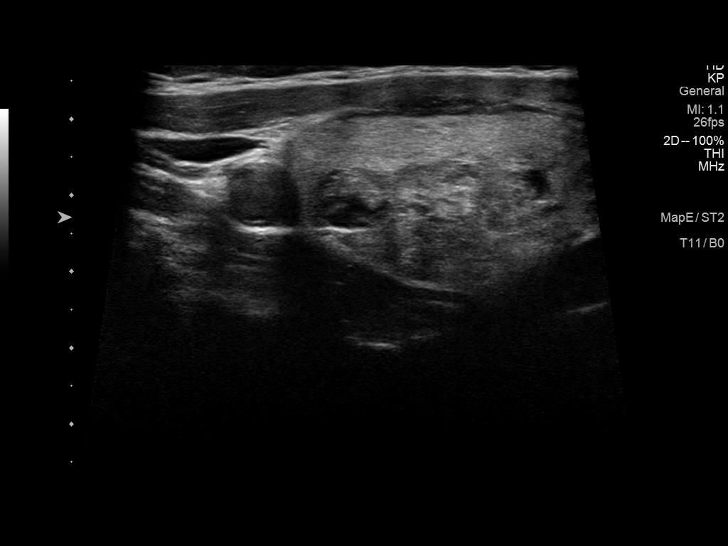
[im 35/64]
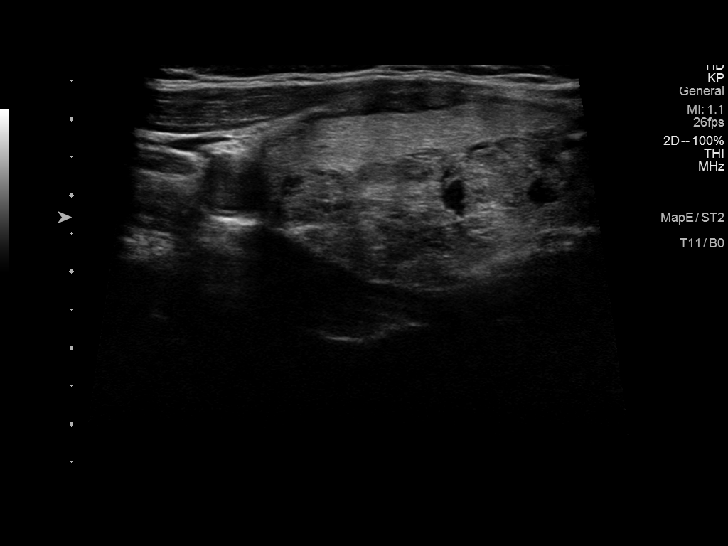
[im 40/64]
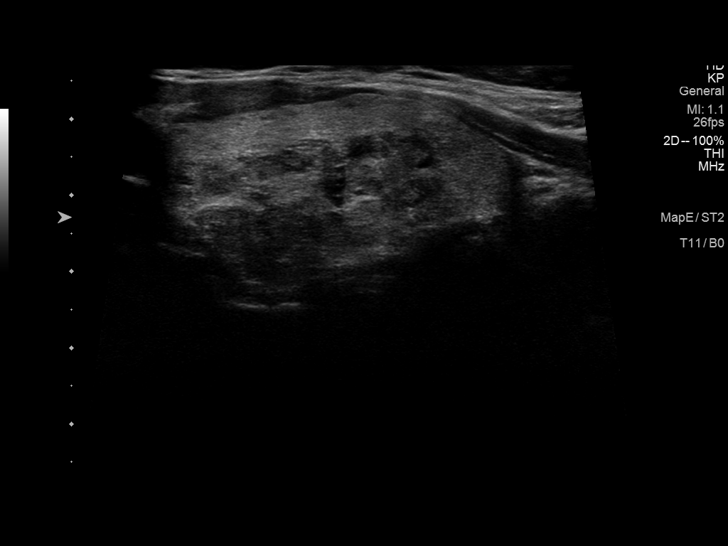
[im 45/64]
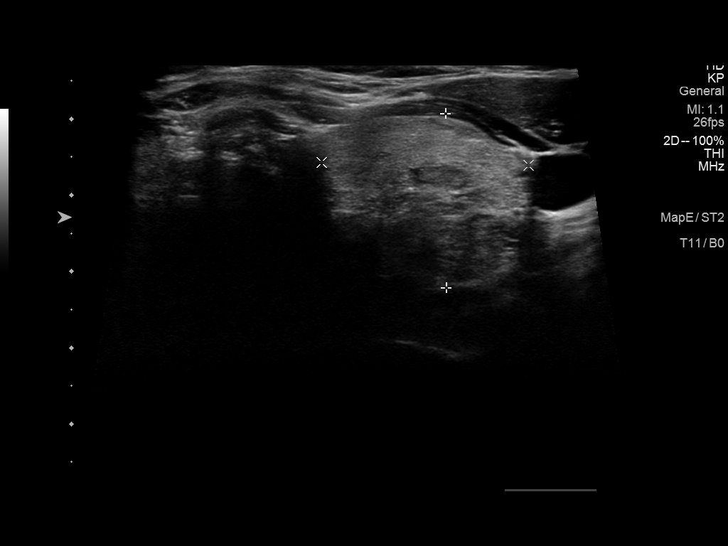
[im 50/64]
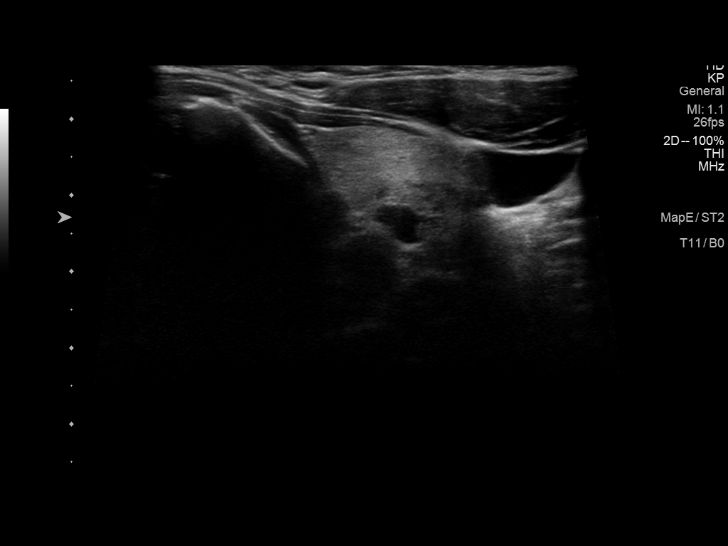
[im 56/64]
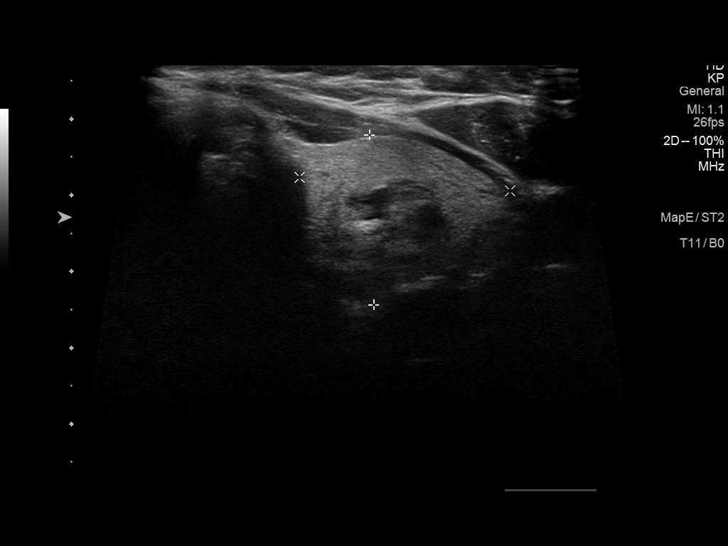
[im 61/64]
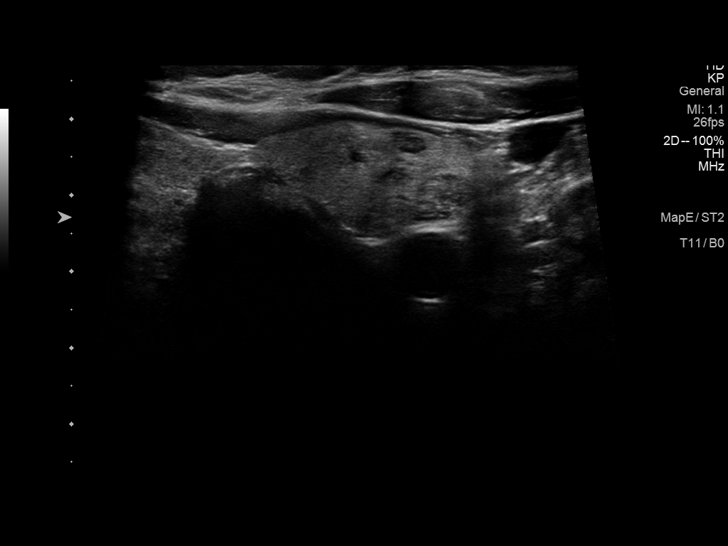

[12 of 25 positions shown; findings below may reference images not displayed]

04/09/2011; 09/17/2009; 07/04/2008

Ultrasound-guided right-sided thyroid nodule fine-needle
aspiration-04/23/2011; 07/25/2008
FINDINGS: Parenchymal Echotexture: Moderately heterogenous

Isthmus: Normal in size measures 0.4 cm in diameter unchanged

Right lobe: Enlarged measuring 6.7 x 2.9 x 4.4 cm previously, 7.2 x
4.3 x 4.2 cm

Left lobe: Borderline enlarged measuring 5.5 x 2.2 x 2.7 cm,
previously, 6.1 x 2.5 x 2.5 cm

_________________________________________________________

Estimated total number of nodules >/= 1 cm: 5

Number of spongiform nodules >/=  2 cm not described below (TR1): 0

Number of mixed cystic and solid nodules >/= 1.5 cm not described
below (TR2): 0

_________________________________________________________

The previously biopsied approximately 3.7 x 3.0 x 2.2 cm
spongiform/benign-appearing mass involving the superior aspect the
right lobe of the thyroid (labeled 1), is unchanged compared to the
04/23/2011 examination, previously, 4.6 x 3.0 x 2.2 cm. Correlation
with previous biopsy results is advised.

Previously noted 4.6 cm isoechoic ill-defined nodule involving the
mid, posterior aspect the right lobe of the thyroid is not
definitely seen on the present examination and thus may have
represented a pseudonodule in the setting of multinodular goiter.

The approximately 2.2 x 2.1 x 1.5 cm isoechoic nodule within the
inferior medial aspect of the right lobe of the thyroid (labeled 3),
is unchanged compared to the [DATE] examination, previously, 2.4 x
2.1 x 1.3 cm. Stability for greater than 5 years is indicative
benign etiology.

_________________________________________________________

The approximately 1.3 cm partially cystic though predominantly solid
nodule within the inferior pole of the left lobe of the thyroid
(labeled 4) is grossly unchanged compared to the 2627 examination,
previously, 1.9 cm. Imaging stability for greater than 5 years is
indicative of a benign etiology.

The approximately 2.0 cm spongiform/benign-appearing nodule within
mid aspect the left lobe of the thyroid (labeled 5) is grossly
unchanged compared to the 2627 examination, previously, 2.1 cm.
etiology.
IMPRESSION: 1. Similar findings of thyromegaly and multinodular goiter. No
worrisome new or enlarging thyroid nodules.
2. Previously biopsied dominant spongiform/benign-appearing
right-sided thyroid nodule/mass is grossly unchanged compared to the
[DATE] examination. Correlation with previous biopsy results is
advised. Additionally, imaging stability for greater than 5 years is
indicative of a benign etiology.
3. The remaining thyroid nodules are grossly unchanged since at
least the 2627 examination. Imaging stability for greater than 5
years is indicative of a benign etiology.

The above is in keeping with the ACR TI-RADS recommendations - [HOSPITAL] 3984;[DATE].

## 2022-01-13 IMAGING — US US ABDOMEN COMPLETE W/ ELASTOGRAPHY
1 series · 12 of 12 positions shown · non-contrast
Comparison: None

CLINICAL DATA: Hepatitis-C carrier

EXAM:
ULTRASOUND ABDOMEN
ULTRASOUND HEPATIC ELASTOGRAPHY
TECHNIQUE: Sonography of the upper abdomen was performed. In addition,
ultrasound elastography evaluation of the liver was performed. A
region of interest was placed within the right lobe of the liver.
Following application of a compressive sonographic pulse, tissue
compressibility was assessed. Multiple assessments were performed at
the selected site. Median tissue compressibility was determined.
Previously, hepatic stiffness was assessed by shear wave velocity.
Based on recently published Society of Radiologists in Ultrasound
consensus article, reporting is now recommended to be performed in
the SI units of pressure (kiloPascals) representing hepatic
stiffness/elasticity. The obtained result is compared to the
published reference standards. (cACLD = compensated Advanced Chronic
Liver Disease)

[Series 1: us abdomen complete w/ elastography · 0.15mm/px · 12 of 12 slices shown]
[im 1/12]
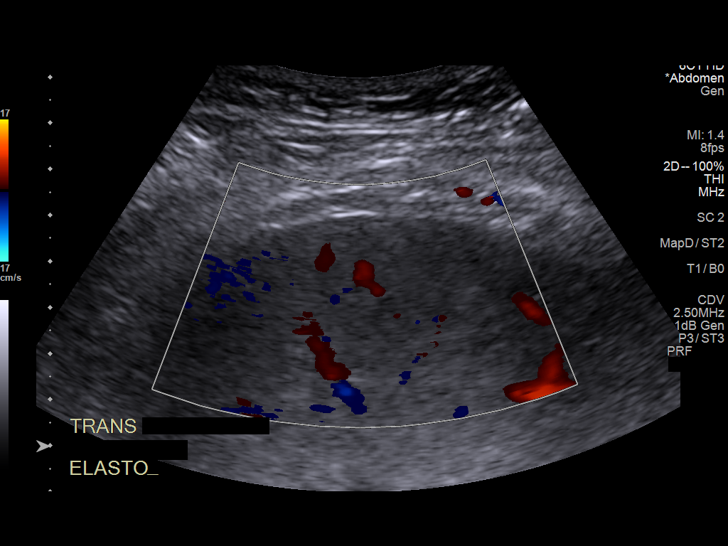
[im 2/12]
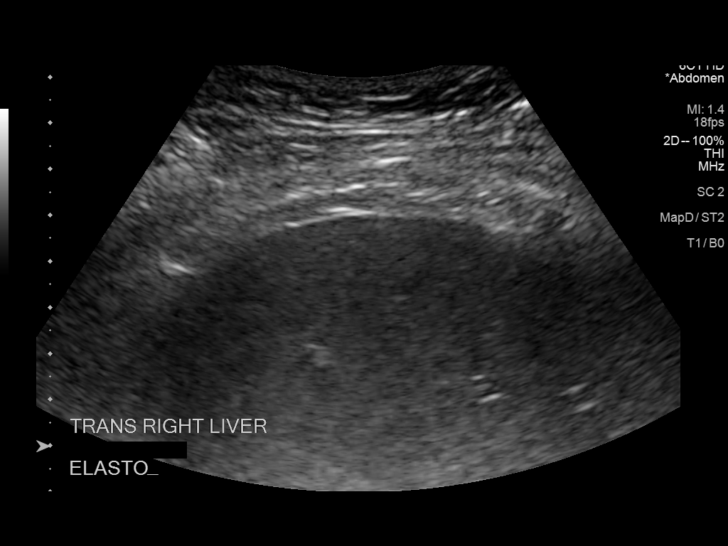
[im 3/12]
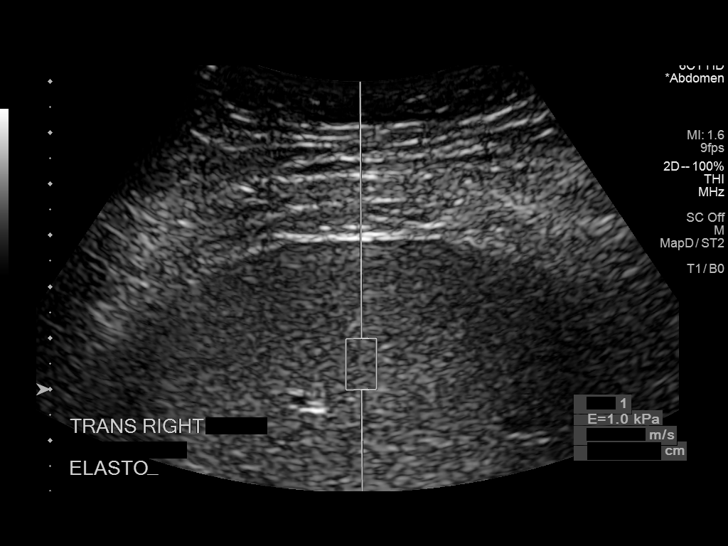
[im 4/12]
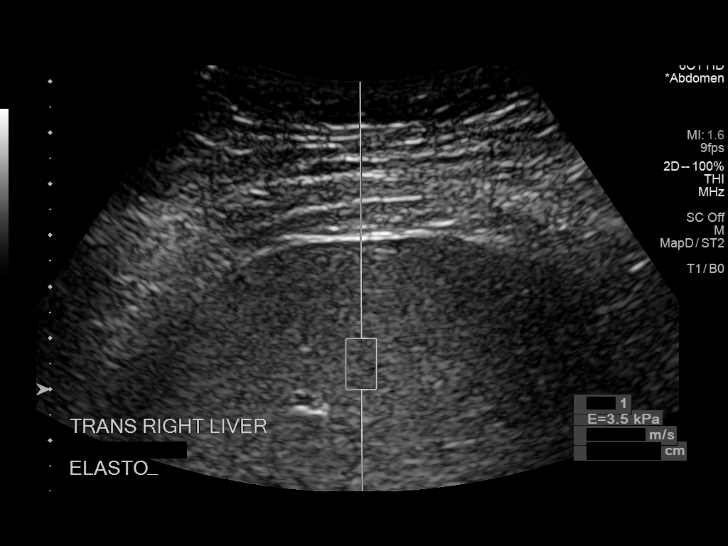
[im 5/12]
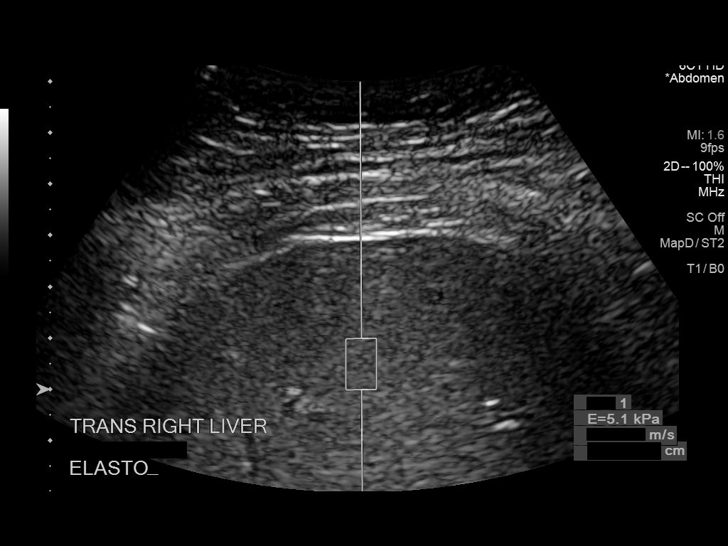
[im 6/12]
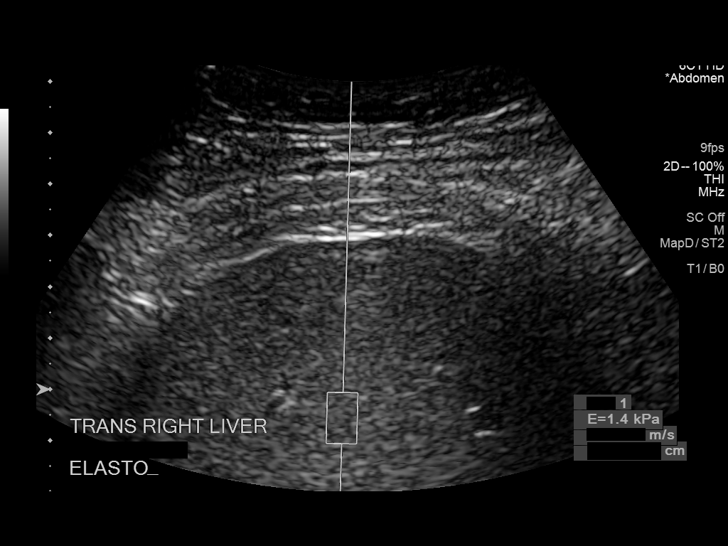
[im 7/12]
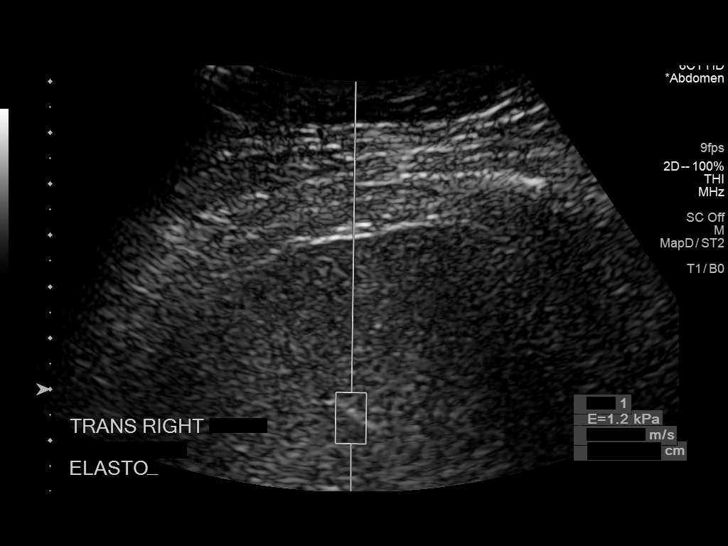
[im 8/12]
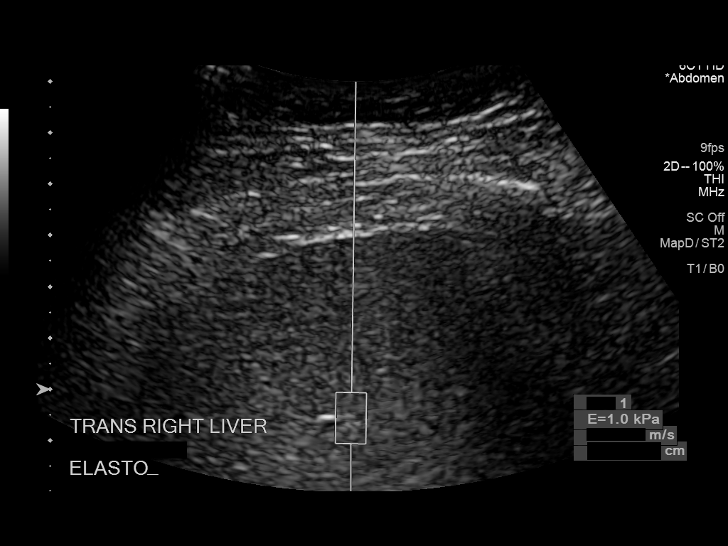
[im 9/12]
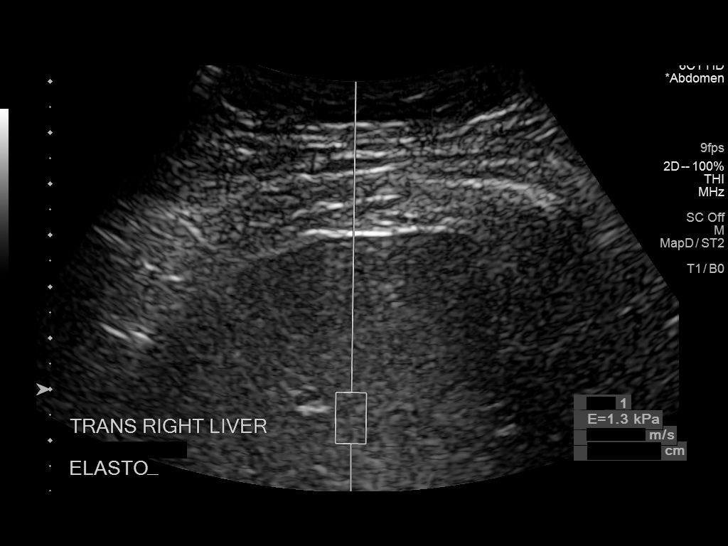
[im 10/12]
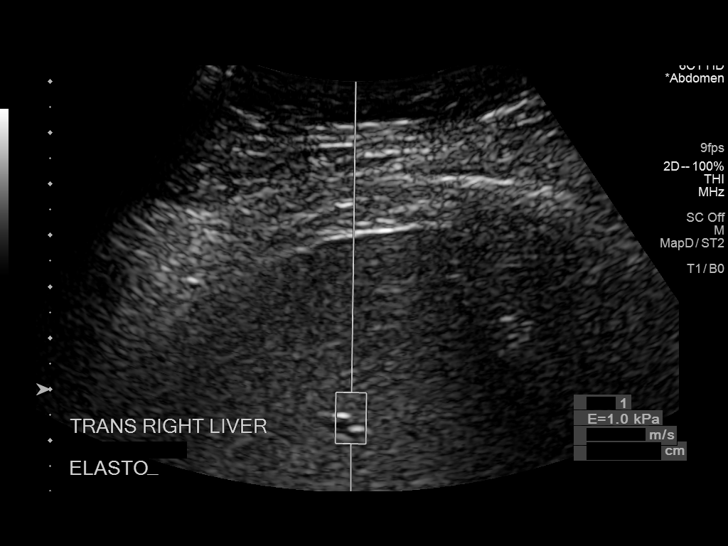
[im 11/12]
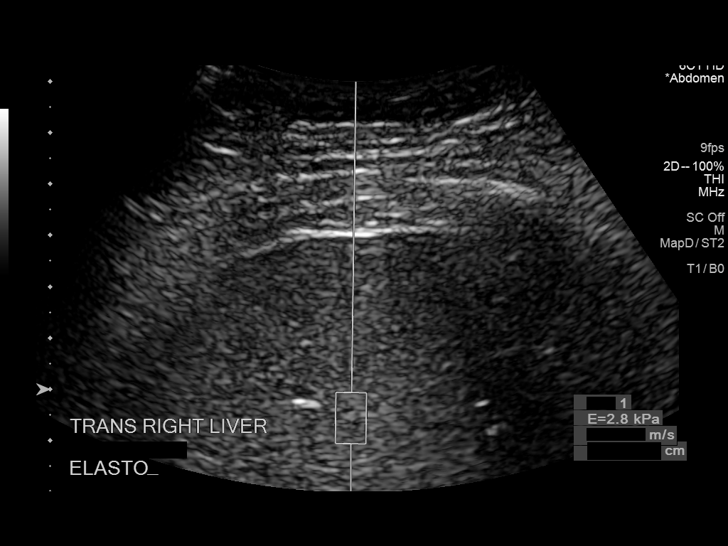
[im 12/12]
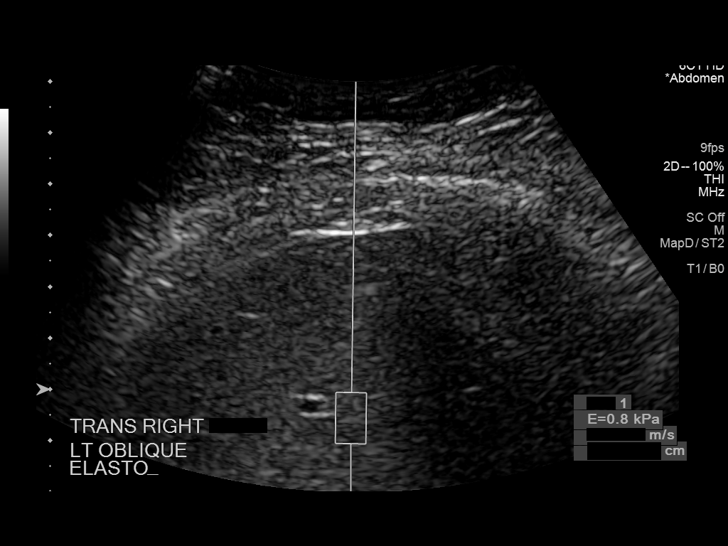

[12 of 12 positions shown; findings below may reference images not displayed]

FINDINGS: ULTRASOUND ABDOMEN

Gallbladder: Sludge within gallbladder. No shadowing calculi,
gallbladder wall thickening or pericholecystic fluid. No sonographic
Murphy sign.

Common bile duct: Diameter: 5 mm, normal

Liver: Heterogeneous upper normal parenchymal echogenicity without
focal mass or nodularity. Portal vein is patent on color Doppler
imaging with normal direction of blood flow towards the liver.

IVC: Normal appearance

Pancreas: Normal appearance

Spleen: Normal appearance, 8.9 cm length

Right Kidney: Length: 11.3 cm. Normal morphology without mass or
hydronephrosis.

Left Kidney: Length: 11.3 cm. Normal morphology without mass or
hydronephrosis.

Abdominal aorta: Normal caliber

Other findings: No free fluid

ULTRASOUND HEPATIC ELASTOGRAPHY

Device: Siemens Helix VTQ

Patient position: Oblique

Transducer [id number]

Number of measurements: 10

Hepatic segment:  8

Median kPa:

IQR:

IQR/Median kPa ratio:

Data quality:  Good

Diagnostic category:  < or = 5 kPa: high probability of being normal

The use of hepatic elastography is applicable to patients with viral
hepatitis and non-alcoholic fatty liver disease. At this time, there
is insufficient data for the referenced cut-off values and use in
other causes of liver disease, including alcoholic liver disease.
Patients, however, may be assessed by elastography and serve as
their own reference standard/baseline.

In patients with non-alcoholic liver disease, the values suggesting
compensated advanced chronic liver disease (cACLD) may be lower, and
patients may need additional testing with elasticity results of [DATE]
kPa.

Please note that abnormal hepatic elasticity and shear wave
velocities may also be identified in clinical settings other than
with hepatic fibrosis, such as: acute hepatitis, elevated right
heart and central venous pressures including use of beta blockers,
Papitin disease (Jumper), infiltrative processes such as
mastocytosis/amyloidosis/infiltrative tumor/lymphoma, extrahepatic
cholestasis, with hyperemia in the post-prandial state, and with
liver transplantation. Correlation with patient history, laboratory
data, and clinical condition recommended.

Diagnostic Categories:

< or =5 kPa: high probability of being normal

< or =9 kPa: in the absence of other known clinical signs, rules [DATE] kPa and ?13 kPa: suggestive of cACLD, but needs further testing

>13 kPa: highly suggestive of cACLD

> or =17 kPa: highly suggestive of cACLD with an increased
probability of clinically significant portal hypertension
IMPRESSION: ULTRASOUND ABDOMEN:

Heterogeneous parenchymal echogenicity of the liver without discrete
mass or nodularity.

Small amount of sludge within the gallbladder.

Otherwise negative ultrasound abdomen

ULTRASOUND HEPATIC ELASTOGRAPHY:

Median kPa:

Diagnostic category:  < or = 5 kPa: high probability of being normal

## 2022-03-03 ENCOUNTER — Encounter: Payer: Self-pay | Admitting: Internal Medicine

## 2022-03-03 ENCOUNTER — Telehealth (INDEPENDENT_AMBULATORY_CARE_PROVIDER_SITE_OTHER): Payer: Medicare Other | Admitting: Internal Medicine

## 2022-03-03 ENCOUNTER — Ambulatory Visit: Payer: Medicare Other | Admitting: Internal Medicine

## 2022-03-03 VITALS — BP 138/88 | HR 76 | Ht 67.0 in | Wt 361.0 lb

## 2022-03-03 DIAGNOSIS — E042 Nontoxic multinodular goiter: Secondary | ICD-10-CM

## 2022-03-03 DIAGNOSIS — E0590 Thyrotoxicosis, unspecified without thyrotoxic crisis or storm: Secondary | ICD-10-CM | POA: Diagnosis not present

## 2022-03-03 LAB — TSH: TSH: 0.4 u[IU]/mL (ref 0.35–5.50)

## 2022-03-03 LAB — T4, FREE: Free T4: 1.07 ng/dL (ref 0.60–1.60)

## 2022-03-03 NOTE — Progress Notes (Signed)
Name: Julia Ware  MRN/ DOB: [id number], 12/02/1955    Age/ Sex: 66 y.o., female     PCP: Andree Moro, DO   Reason for Endocrinology Evaluation: [place]     Initial Endocrinology Clinic Visit: 10/30/2020    PATIENT IDENTIFIER: Julia Ware is a 66 y.o., female with a past medical history of HTN and MNG. She has followed with [facility] since 10/30/2020 for consultative assistance with management of her MNG.   HISTORICAL SUMMARY:   She has an incidental finding of thyroid nodule in CT scan in 2009. This prompted thyroid ultrasound confirming MNG with hx of benign FNA in 2010 ( right inferior ) and 2012 ( right superior ) nodules    She also had a thyroid uptake and scan in 2009 with hyperthyroidism with normal uptake at 16.8%.    MNG stability confirmed over a 5 year period and no serial ultrasound was recommended   No FH of thyroid disease   SUBJECTIVE:     Today (03/03/2022):  Julia Ware is here for a follow up on MNG and subclinical hyperthyroidism.   Weight has been stable  Denies any local swelling in the neck , denies dysphagia  Denies diarrhea or loose stools  She denies local neck symptoms Has occasional palpitations  Denies tremors  Continues to follow up with ortho for OA She had recent DXa through PCP's office with no osteoporosis   She was treated for Hep C through [facility] , so she is concerned about methimazole due to liver side effects     HISTORY:  Past Medical History:  Past Medical History:  Diagnosis Date   Hepatitis C    Hypertension    Hyperthyroidism    Thyroid nodule    Past Surgical History:  Past Surgical History:  Procedure Laterality Date   COLONOSCOPY WITH PROPOFOL N/A 12/28/2020   Procedure: COLONOSCOPY WITH PROPOFOL;  Surgeon: Carol Ada, MD;  Location: [facility];  Service: Endoscopy;  Laterality: N/A;   POLYPECTOMY  12/28/2020   Procedure: POLYPECTOMY;  Surgeon: Carol Ada, MD;  Location: [facility]  ENDOSCOPY;  Service: Endoscopy;;   Social History:  reports that she has never smoked. She has never used smokeless tobacco. She reports current alcohol use. She reports that she does not use drugs. Family History:  Family History  Problem Relation Age of Onset   Diabetes Mother    Hypertension Mother    Kidney disease Father    Hypertension Father    Hyperlipidemia Father      HOME MEDICATIONS: Allergies as of 03/03/2022   No Known Allergies      Medication List        Accurate as of March 03, 2022  8:57 AM. If you have any questions, ask your nurse or doctor.          STOP taking these medications    amoxicillin 500 MG tablet Commonly known as: AMOXIL Stopped by: Dorita Sciara, MD       TAKE these medications    acetaminophen 500 MG tablet Commonly known as: TYLENOL Take 1,000 mg by mouth every 6 (six) hours as needed (pain).   carvedilol 6.25 MG tablet Commonly known as: COREG Take 6.25 mg by mouth 2 (two) times daily.   ibuprofen 800 MG tablet Commonly known as: ADVIL Take 800 mg by mouth 3 (three) times daily.   lisinopril-hydrochlorothiazide 20-12.5 MG tablet Commonly known as: ZESTORETIC Take 1 tablet by mouth in the morning.  OBJECTIVE:   PHYSICAL EXAM: VS: BP 138/88 (BP Location: Left Arm, Patient Position: Sitting, Cuff Size: Large)   Pulse 76   Ht '5\' 7"'$  (1.702 m)   Wt (!) 361 lb (163.7 kg)   SpO2 99%   BMI 56.54 kg/m    EXAM: General: Pt appears well and is in NAD  Neck: General: Supple without adenopathy. Thyroid: Right nodule appreciated.   Lungs: Clear with good BS bilat with no rales, rhonchi, or wheezes  Heart: Auscultation: RRR.  Abdomen: Normoactive bowel sounds, soft, nontender, without masses or organomegaly palpable  Extremities:  BL LE: No pretibial edema   Mental Status: Judgment, insight: Intact Orientation: Oriented to time, place, and person Mood and affect: No depression, anxiety, or  agitation     DATA REVIEWED:  Latest Reference Range & Units 03/03/22 09:34  TSH 0.35 - 5.50 uIU/mL 0.40  T4,Free(Direct) 0.60 - 1.60 ng/dL 1.07    04/18/2021 TSH 0.21 uIU/mL FT4 1.4 ng/dL   FNA right superior 3.1 cm 04/23/2011: Benign        FNA right lower pole nodule 4 cm 2/9/20210: benign     Thyroid Ultrasound 08/20/2020  The previously biopsied approximately 3.7 x 3.0 x 2.2 cm spongiform/benign-appearing mass involving the superior aspect the right lobe of the thyroid (labeled 1), is unchanged compared to the 04/23/2011 examination, previously, 4.6 x 3.0 x 2.2 cm. Correlation with previous biopsy results is advised.   Previously noted 4.6 cm isoechoic ill-defined nodule involving the mid, posterior aspect the right lobe of the thyroid is not definitely seen on the present examination and thus may have represented a pseudonodule in the setting of multinodular goiter.   The approximately 2.2 x 2.1 x 1.5 cm isoechoic nodule within the inferior medial aspect of the right lobe of the thyroid (labeled 3), is unchanged compared to the 08/2013 examination, previously, 2.4 x 2.1 x 1.3 cm. Stability for greater than 5 years is indicative benign etiology.   _________________________________________________________   The approximately 1.3 cm partially cystic though predominantly solid nodule within the inferior pole of the left lobe of the thyroid (labeled 4) is grossly unchanged compared to the 2015 examination, previously, 1.9 cm. Imaging stability for greater than 5 years is indicative of a benign etiology.   The approximately 2.0 cm spongiform/benign-appearing nodule within mid aspect the left lobe of the thyroid (labeled 5) is grossly unchanged compared to the 2015 examination, previously, 2.1 cm. Imaging stability for greater than 5 years is indicative of benign etiology.   IMPRESSION: 1. 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 No worrisome new or  enlarging thyroid nodules. 2. Previously biopsied dominant spongiform/benign-appearing right-sided thyroid nodule/mass is grossly unchanged compared to the 20/12 examination. Correlation with previous biopsy results is advised. Additionally, imaging stability for greater than 5 years is indicative of a benign etiology. 3. The remaining thyroid nodules are grossly unchanged since at least the 2015 examination. Imaging stability for greater than 5 years is indicative of a benign etiology.        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 MNG:   - No local neck symptoms  - Reassurance has been provided as these nodules have not increased in size in over 5 yrs.  -  No serial thyroid ultrasound are indicated unless there's a clinical concern about change is size    F/U in 1 yr    Signed electronically by: Mack Guise, MD  [facility]  [facility] [address] [place], [place] [address] Phone: [phone] FAX: [phone]      CC: Andree Moro, [place] [address] Phone: [phone]  Fax: [phone]   Return to Endocrinology clinic as below: No future appointments.

## 2022-03-19 NOTE — Telephone Encounter (Signed)
Error

## 2022-04-16 ENCOUNTER — Encounter (INDEPENDENT_AMBULATORY_CARE_PROVIDER_SITE_OTHER): Payer: Medicare Other | Admitting: Family Medicine

## 2022-04-16 DIAGNOSIS — Z0289 Encounter for other administrative examinations: Secondary | ICD-10-CM

## 2022-04-29 ENCOUNTER — Other Ambulatory Visit (INDEPENDENT_AMBULATORY_CARE_PROVIDER_SITE_OTHER): Payer: Self-pay | Admitting: Family Medicine

## 2022-04-29 ENCOUNTER — Encounter (INDEPENDENT_AMBULATORY_CARE_PROVIDER_SITE_OTHER): Payer: Self-pay | Admitting: Family Medicine

## 2022-04-29 ENCOUNTER — Ambulatory Visit (INDEPENDENT_AMBULATORY_CARE_PROVIDER_SITE_OTHER): Payer: Medicare Other | Admitting: Family Medicine

## 2022-04-29 VITALS — BP 162/98 | HR 65 | Temp 98.7°F | Ht 65.0 in | Wt 359.0 lb

## 2022-04-29 DIAGNOSIS — Z1331 Encounter for screening for depression: Secondary | ICD-10-CM

## 2022-04-29 DIAGNOSIS — R0602 Shortness of breath: Secondary | ICD-10-CM

## 2022-04-29 DIAGNOSIS — R5383 Other fatigue: Secondary | ICD-10-CM | POA: Diagnosis not present

## 2022-04-29 DIAGNOSIS — D509 Iron deficiency anemia, unspecified: Secondary | ICD-10-CM

## 2022-04-29 DIAGNOSIS — R739 Hyperglycemia, unspecified: Secondary | ICD-10-CM

## 2022-04-29 DIAGNOSIS — I1A0 Resistant hypertension: Secondary | ICD-10-CM

## 2022-04-29 DIAGNOSIS — E559 Vitamin D deficiency, unspecified: Secondary | ICD-10-CM | POA: Diagnosis not present

## 2022-04-29 DIAGNOSIS — Z6841 Body Mass Index (BMI) 40.0 and over, adult: Secondary | ICD-10-CM

## 2022-04-29 MED ORDER — LISINOPRIL-HYDROCHLOROTHIAZIDE 20-25 MG PO TABS: 1.0000 | ORAL_TABLET | Freq: Every day | ORAL | 0 refills | Status: DC

## 2022-05-01 LAB — COMPREHENSIVE METABOLIC PANEL
ALT: 13 IU/L (ref 0–32)
AST: 13 IU/L (ref 0–40)
Albumin/Globulin Ratio: 1.4 (ref 1.2–2.2)
Albumin: 4.2 g/dL (ref 3.9–4.9)
Alkaline Phosphatase: 65 IU/L (ref 44–121)
BUN/Creatinine Ratio: 13 (ref 12–28)
BUN: 13 mg/dL (ref 8–27)
Bilirubin Total: 0.5 mg/dL (ref 0.0–1.2)
CO2: 26 mmol/L (ref 20–29)
Calcium: 9.3 mg/dL (ref 8.7–10.3)
Chloride: 100 mmol/L (ref 96–106)
Creatinine, Ser: 0.97 mg/dL (ref 0.57–1.00)
Globulin, Total: 3.1 g/dL (ref 1.5–4.5)
Glucose: 88 mg/dL (ref 70–99)
Potassium: 4 mmol/L (ref 3.5–5.2)
Sodium: 139 mmol/L (ref 134–144)
Total Protein: 7.3 g/dL (ref 6.0–8.5)
eGFR: 64 mL/min/{1.73_m2} (ref >59)

## 2022-05-01 LAB — ANEMIA PANEL
Ferritin: 374 ng/mL — ABNORMAL HIGH (ref 15–150)
Folate, Hemolysate: 302 ng/mL
Folate, RBC: 751 ng/mL (ref >498)
Hematocrit: 40.2 % (ref 34.0–46.6)
Iron Saturation: 14 % — ABNORMAL LOW (ref 15–55)
Iron: 43 ug/dL (ref 27–139)
Retic Ct Pct: 1.4 % (ref 0.6–2.6)
Total Iron Binding Capacity: 315 ug/dL (ref 250–450)
UIBC: 272 ug/dL (ref 118–369)
Vitamin B-12: 340 pg/mL (ref 232–1245)

## 2022-05-01 LAB — LIPID PANEL WITH LDL/HDL RATIO
Cholesterol, Total: 178 mg/dL (ref 100–199)
HDL: 60 mg/dL (ref >39)
LDL Chol Calc (NIH): 100 mg/dL — ABNORMAL HIGH (ref 0–99)
LDL/HDL Ratio: 1.7 ratio (ref 0.0–3.2)
Triglycerides: 97 mg/dL (ref 0–149)
VLDL Cholesterol Cal: 18 mg/dL (ref 5–40)

## 2022-05-01 LAB — CBC WITH DIFFERENTIAL/PLATELET
Basophils Absolute: 0 10*3/uL (ref 0.0–0.2)
Basos: 0 %
EOS (ABSOLUTE): 0.1 10*3/uL (ref 0.0–0.4)
Eos: 1 %
Hemoglobin: 13.3 g/dL (ref 11.1–15.9)
Immature Grans (Abs): 0 10*3/uL (ref 0.0–0.1)
Immature Granulocytes: 0 %
Lymphocytes Absolute: 2.4 10*3/uL (ref 0.7–3.1)
Lymphs: 33 %
MCH: 25.8 pg — ABNORMAL LOW (ref 26.6–33.0)
MCHC: 33.1 g/dL (ref 31.5–35.7)
MCV: 78 fL — ABNORMAL LOW (ref 79–97)
Monocytes Absolute: 0.4 10*3/uL (ref 0.1–0.9)
Monocytes: 5 %
Neutrophils Absolute: 4.5 10*3/uL (ref 1.4–7.0)
Neutrophils: 61 %
Platelets: 203 10*3/uL (ref 150–450)
RBC: 5.15 x10E6/uL (ref 3.77–5.28)
RDW: 13.1 % (ref 11.7–15.4)
WBC: 7.4 10*3/uL (ref 3.4–10.8)

## 2022-05-01 LAB — INSULIN, RANDOM: INSULIN: 13.9 u[IU]/mL (ref 2.6–24.9)

## 2022-05-01 LAB — HEMOGLOBIN A1C
Est. average glucose Bld gHb Est-mCnc: 108 mg/dL
Hgb A1c MFr Bld: 5.4 % (ref 4.8–5.6)

## 2022-05-01 LAB — VITAMIN D 25 HYDROXY (VIT D DEFICIENCY, FRACTURES): Vit D, 25-Hydroxy: 30 ng/mL (ref 30.0–100.0)

## 2022-05-13 ENCOUNTER — Ambulatory Visit (INDEPENDENT_AMBULATORY_CARE_PROVIDER_SITE_OTHER): Payer: Medicare Other | Admitting: Family Medicine

## 2022-05-13 ENCOUNTER — Encounter (INDEPENDENT_AMBULATORY_CARE_PROVIDER_SITE_OTHER): Payer: Self-pay | Admitting: Family Medicine

## 2022-05-13 VITALS — BP 178/82 | HR 136 | Temp 98.8°F | Ht 65.0 in | Wt 357.0 lb

## 2022-05-13 DIAGNOSIS — D509 Iron deficiency anemia, unspecified: Secondary | ICD-10-CM

## 2022-05-13 DIAGNOSIS — I1A0 Resistant hypertension: Secondary | ICD-10-CM | POA: Diagnosis not present

## 2022-05-13 DIAGNOSIS — E669 Obesity, unspecified: Secondary | ICD-10-CM

## 2022-05-13 DIAGNOSIS — E559 Vitamin D deficiency, unspecified: Secondary | ICD-10-CM | POA: Diagnosis not present

## 2022-05-13 DIAGNOSIS — E88819 Insulin resistance, unspecified: Secondary | ICD-10-CM | POA: Diagnosis not present

## 2022-05-13 DIAGNOSIS — Z6841 Body Mass Index (BMI) 40.0 and over, adult: Secondary | ICD-10-CM

## 2022-05-13 MED ORDER — LISINOPRIL-HYDROCHLOROTHIAZIDE 20-25 MG PO TABS: 2.0000 | ORAL_TABLET | Freq: Every day | ORAL | 0 refills | Status: DC

## 2022-05-13 MED ORDER — VITAMIN D (ERGOCALCIFEROL) 1.25 MG (50000 UNIT) PO CAPS: 50000.0000 [IU] | ORAL_CAPSULE | ORAL | 0 refills | Status: DC

## 2022-05-19 NOTE — Progress Notes (Signed)
Chief Complaint:   OBESITY Julia Ware (MR# [id number]) is Julia 66 y.o. female who presents for evaluation and treatment of obesity and related comorbidities. Current BMI is Body mass index is 59.74 kg/m. Julia Ware has been struggling with her weight for many years and has been unsuccessful in either losing weight, maintaining weight loss, or reaching her healthy weight goal.  Julia Ware is currently in the action stage of change and ready to dedicate time achieving and maintaining Julia healthier weight. Julia Ware is interested in becoming our patient and working on intensive lifestyle modifications including (but not limited to) diet and exercise for weight loss.  Julia Ware's habits were reviewed today and are as follows: Her family eats meals together, she thinks her family will eat healthier with her, her desired weight loss is 160 ponds, she has been heavy most of her life, she started gaining weight at age 36, her heaviest weight ever was 360 pounds, she snacks frequently in the evenings, she skips meals frequently, she is frequently drinking liquids with calories, she frequently makes poor food choices, she frequently eats larger portions than normal, and she has binge eating behaviors.  Depression Screen Julia Ware's Food and Mood (modified PHQ-9) score was 9.      No data to display         Subjective:   1. Other fatigue EKG-NSR at 62 bpm. Julia Ware admits to daytime somnolence and reports waking up still tired. Patient has Julia history of symptoms of daytime fatigue and morning fatigue. Julia Ware generally gets 6 hours of sleep per night, and states that she has generally restful sleep. Snoring is present. Apneic episodes are not present. Epworth Sleepiness Score is 7.    2. SOBOE (shortness of breath on exertion) Julia Ware notes increasing shortness of breath with exercising and seems to be worsening over time with weight gain. She notes getting out of breath sooner with activity than she used to. This has not  gotten worse recently. Julia Ware denies shortness of breath at rest or orthopnea.   3. Resistant hypertension Julia Ware's blood pressure elevated today. Her blood pressure at home 431V systolic. She is on Lisinopril/HCTZ and Carvedilol.  4. Vitamin D deficiency Julia Ware is taking Vit D 2K IU daily. She notes fatigue.   5. Hyperglycemia Julia Ware was diagnosed years ago. Her last A1c was 6.1-per patient.  6. Microcytic anemia Julia Ware is not sure what kind of anemia. MCV 77, RBC elevated.  Assessment/Plan:   1. Other fatigue Lamika does feel that her weight is causing her energy to be lower than it should be. Fatigue may be related to obesity, depression or many other causes. Labs will be ordered, and in the meanwhile, Shaelynn will focus on self care including making healthy food choices, increasing physical activity and focusing on stress reduction.   - EKG 12-Lead  2. SOBOE (shortness of breath on exertion) Zoa does feel that she gets out of breath more easily that she used to when she exercises. Nesha's shortness of breath appears to be obesity related and exercise induced. She has agreed to work on weight loss and gradually increase exercise to treat her exercise induced shortness of breath. Will continue to monitor closely.   3. Resistant hypertension We will obtain labs today. Increase Lisinopril HCTZ to 20/25. Will follow up on blood pressure at next appointment and if blood pressure is still elevated, will switch/blocker to CCB.  - Comprehensive metabolic panel - Lipid Panel With LDL/HDL Ratio  4. Vitamin D deficiency We  will obtain labs today.  - VITAMIN D 25 Hydroxy (Vit-D Deficiency, Fractures)  5. Hyperglycemia We will obtain labs today.  - Hemoglobin A1c - Insulin, random  6. Microcytic anemia We will obtain labs today.  - CBC with Differential/Platelet - Anemia panel  7. Depression screening Julia Ware had Julia positive depression screening. Depression is commonly associated with  obesity and often results in emotional eating behaviors. We will monitor this closely and work on CBT to help improve the non-hunger eating patterns. Referral to Psychology may be required if no improvement is seen as she continues in our clinic.   8. 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 I recommend Julia Ware begin the structured treatment plan as follows:  She has agreed to the Category 3 Plan.  Exercise goals: No exercise has been prescribed at this time.   Behavioral modification strategies: increasing lean protein intake, meal planning and cooking strategies, keeping healthy foods in the home, and planning for success.  She was informed of the importance of frequent follow-up visits to maximize her success with intensive lifestyle modifications for her multiple health conditions. She was informed we would discuss her lab results at her next visit unless there is Julia critical issue that needs to be addressed sooner. Julia Ware agreed to keep her next visit at the agreed upon time to discuss these results.  Objective:   Blood pressure (!) 162/98, pulse 65, temperature 98.7 F (37.1 C), height '5\' 5"'$  (1.651 m), weight (!) 359 lb (162.8 kg), SpO2 99 %. Body mass index is 59.74 kg/m.  EKG: Normal sinus rhythm, rate 62 bpm.  Indirect Calorimeter completed today shows Julia VO2 of 269 ml and Julia REE of 1858.  Her calculated basal metabolic rate is 3790 thus her basal metabolic rate is worse than expected.  General: Cooperative, alert, well developed, in no acute distress. HEENT: Conjunctivae and lids unremarkable. Cardiovascular: Regular rhythm.  Lungs: Normal work of breathing. Neurologic: No focal deficits.   Lab Results  Component Value Date   CREATININE 0.97 04/29/2022   BUN 13 04/29/2022   NA 139  04/29/2022   K 4.0 04/29/2022   CL 100 04/29/2022   CO2 26 04/29/2022   Lab Results  Component Value Date   ALT 13 04/29/2022   AST 13 04/29/2022   ALKPHOS 65 04/29/2022   BILITOT 0.5 04/29/2022   Lab Results  Component Value Date   HGBA1C 5.4 04/29/2022   Lab Results  Component Value Date   INSULIN 13.9 04/29/2022   Lab Results  Component Value Date   TSH 0.40 03/03/2022   Lab Results  Component Value Date   CHOL 178 04/29/2022   HDL 60 04/29/2022   LDLCALC 100 (H) 04/29/2022   TRIG 97 04/29/2022   Lab Results  Component Value Date   WBC 7.4 04/29/2022   HGB 13.3 04/29/2022   HCT 40.2 04/29/2022   MCV 78 (L) 04/29/2022   PLT 203 04/29/2022   Lab Results  Component Value Date   IRON 43 04/29/2022   TIBC 315 04/29/2022   FERRITIN 374 (H) 04/29/2022   Attestation Statements:   Reviewed by clinician on day of visit: allergies, medications, problem list, medical history, surgical history, family history, social history, and previous encounter notes.  I, Brendell Tyus, RMA am acting as transcriptionist  for Coralie Common, MD. This is the patient's first visit at [facility]. The patient's NEW PATIENT PACKET was reviewed at length. Included in the packet: current and past health history, medications, allergies, ROS, gynecologic history (women only), surgical history, family history, social history, weight history, weight loss surgery history (for those that have had weight loss surgery), nutritional evaluation, mood and food questionnaire, PHQ9, Epworth questionnaire, sleep habits questionnaire, patient life and health improvement goals questionnaire. These will all be scanned into the patient's chart under media.   During the visit, I independently reviewed the patient's EKG, bioimpedance scale results, and indirect calorimeter results. I used this information to tailor Julia meal plan for the patient that will help her to lose weight and will improve her  obesity-related conditions going forward. I performed Julia medically necessary appropriate examination and/or evaluation. I discussed the assessment and treatment plan with the patient. The patient was provided an opportunity to ask questions and all were answered. The patient agreed with the plan and demonstrated an understanding of the instructions. Labs were ordered at this visit and will be reviewed at the next visit unless more critical results need to be addressed immediately. Clinical information was updated and documented in the EMR.   Time spent on visit including pre-visit chart review and post-visit care was 45 minutes.   I have reviewed the above documentation for accuracy and completeness, and I agree with the above. - Coralie Common, MD

## 2022-05-21 NOTE — Progress Notes (Signed)
Chief Complaint:   OBESITY Julia Ware is here to discuss her progress with her obesity treatment plan along with follow-up of her obesity related diagnoses. Julia Ware is on the Category 3 Plan and states she is following her eating plan approximately 50% of the time. Julia Ware states she is exercising 0 minutes 0 times per week.  Today's visit was #: 2 Starting weight: 359 lbs Starting date: 04/29/2022 Today's weight: 357 lbs Today's date: 05/13/2022 Total lbs lost to date: 2 lbs Total lbs lost since last in-office visit: 2  Interim History: Julia Ware felt plan was okay but felt she did not do as well in terms of compliance.  Felt worried/concerned that she was not as compliant.  Felt bad for eating indulgently.  Next few weeks she has work and routine life.  Subjective:   1. Resistant hypertension Julia Ware's blood pressure very elevated today again--prior medication dose she was taking twice a day but was not written that way. Denies chest pain, chest pressure and headache. Patient brought wrist cuff in and it reads higher.  2. Microcytic anemia Labs discussed during visit today. Julia Ware's MCV of 78, iron within normal limits but Ferritin is increased (acute phase reactant).  She reports she was not ever told she was anemic.  3. Insulin resistance Julia Ware's A1c at 5.4, insulin at 13.9, she is not on medication. Carb cravings.  4. Vitamin D deficiency Labs discussed during visit today.  Julia Ware's vitamin D level of 30.  On supplement but still low.  Assessment/Plan:   1. Resistant hypertension Increase/refill ZESTORETIC 20-25 mg to 2 tabs daily for 1 month with 0 refills.  -Increase/refill lisinopril-hydrochlorothiazide (ZESTORETIC) 20-25 MG tablet; Take 2 tablets by mouth daily.  Dispense: 180 tablet; Refill: 0  2. Microcytic anemia Julia Ware is encouraged to start prenatal vitamin.  3. Insulin resistance Pathophysiology of insulin resistance, prediabetes and diabetes discussed.  Continue with  category 3 with no changes in meal planning.  4. Vitamin D deficiency Start Vit D 50K IU once a week for 1 month with 0 refills.  -Start Vitamin D, Ergocalciferol, (DRISDOL) 1.25 MG (50000 UNIT) CAPS capsule; Take 1 capsule (50,000 Units total) by mouth every 7 (seven) days.  Dispense: 4 capsule; Refill: 0  5. Obesity with current BMI of 59.5 Julia Ware is currently in the action stage of change. As such, her goal is to continue with weight loss efforts. She has agreed to the Category 3 Plan.   Exercise goals: All adults should avoid inactivity. Some physical activity is better than none, and adults who participate in any amount of physical activity gain some health benefits.  Behavioral modification strategies: increasing lean protein intake, meal planning and cooking strategies, keeping healthy foods in the home, holiday eating strategies , and planning for success.  Julia Ware has agreed to follow-up with our clinic in 2 weeks. She was informed of the importance of frequent follow-up visits to maximize her success with intensive lifestyle modifications for her multiple health conditions.   Objective:   Blood pressure (!) 178/82, pulse (!) 136, temperature 98.8 F (37.1 C), height '5\' 5"'$  (1.651 m), weight (!) 357 lb (161.9 kg). Body mass index is 59.41 kg/m.  General: Cooperative, alert, well developed, in no acute distress. HEENT: Conjunctivae and lids unremarkable. Cardiovascular: Regular rhythm.  Lungs: Normal work of breathing. Neurologic: No focal deficits.   Lab Results  Component Value Date   CREATININE 0.97 04/29/2022   BUN 13 04/29/2022   NA 139 04/29/2022   K 4.0  04/29/2022   CL 100 04/29/2022   CO2 26 04/29/2022   Lab Results  Component Value Date   ALT 13 04/29/2022   AST 13 04/29/2022   ALKPHOS 65 04/29/2022   BILITOT 0.5 04/29/2022   Lab Results  Component Value Date   HGBA1C 5.4 04/29/2022   Lab Results  Component Value Date   INSULIN 13.9 04/29/2022   Lab  Results  Component Value Date   TSH 0.40 03/03/2022   Lab Results  Component Value Date   CHOL 178 04/29/2022   HDL 60 04/29/2022   LDLCALC 100 (H) 04/29/2022   TRIG 97 04/29/2022   Lab Results  Component Value Date   VD25OH 30.0 04/29/2022   Lab Results  Component Value Date   WBC 7.4 04/29/2022   HGB 13.3 04/29/2022   HCT 40.2 04/29/2022   MCV 78 (L) 04/29/2022   PLT 203 04/29/2022   Lab Results  Component Value Date   IRON 43 04/29/2022   TIBC 315 04/29/2022   FERRITIN 374 (H) 04/29/2022   Attestation Statements:   Reviewed by clinician on day of visit: allergies, medications, problem list, medical history, surgical history, family history, social history, and previous encounter notes.  Time spent on visit including pre-visit chart review and post-visit care and charting was 45 minutes.   I, Elnora Morrison, RMA am acting as transcriptionist for Coralie Common, MD.  I have reviewed the above documentation for accuracy and completeness, and I agree with the above. - Coralie Common, MD

## 2022-06-03 ENCOUNTER — Other Ambulatory Visit (INDEPENDENT_AMBULATORY_CARE_PROVIDER_SITE_OTHER): Payer: Self-pay | Admitting: Family Medicine

## 2022-06-03 ENCOUNTER — Ambulatory Visit (INDEPENDENT_AMBULATORY_CARE_PROVIDER_SITE_OTHER): Payer: Medicare Other | Admitting: Family Medicine

## 2022-06-03 ENCOUNTER — Encounter (INDEPENDENT_AMBULATORY_CARE_PROVIDER_SITE_OTHER): Payer: Self-pay | Admitting: Family Medicine

## 2022-06-03 VITALS — BP 156/84 | HR 101 | Temp 98.6°F | Ht 65.0 in | Wt 349.0 lb

## 2022-06-03 DIAGNOSIS — I1A0 Resistant hypertension: Secondary | ICD-10-CM | POA: Diagnosis not present

## 2022-06-03 DIAGNOSIS — Z6841 Body Mass Index (BMI) 40.0 and over, adult: Secondary | ICD-10-CM

## 2022-06-03 DIAGNOSIS — E559 Vitamin D deficiency, unspecified: Secondary | ICD-10-CM

## 2022-06-03 DIAGNOSIS — E669 Obesity, unspecified: Secondary | ICD-10-CM

## 2022-06-03 MED ORDER — AMLODIPINE BESYLATE 5 MG PO TABS: 5.0000 mg | ORAL_TABLET | Freq: Every day | ORAL | 0 refills | Status: DC

## 2022-06-03 MED ORDER — VITAMIN D (ERGOCALCIFEROL) 1.25 MG (50000 UNIT) PO CAPS: 50000.0000 [IU] | ORAL_CAPSULE | ORAL | 0 refills | Status: DC

## 2022-06-18 NOTE — Progress Notes (Signed)
Chief Complaint:   OBESITY Julia Ware is here to discuss her progress with her obesity treatment plan along with follow-up of her obesity related diagnoses. Julia Ware is on the Category 3 Plan and states she is following her eating plan approximately 50% of the time. Julia Ware states she is walking 20 minutes.  Today's visit was #: 3 Starting weight: 359 lbs Starting date: 04/29/2022 Today's weight: 349 lbs Today's date: 06/03/2022 Total lbs lost to date: 10 lbs Total lbs lost since last in-office visit: 8  Interim History: Julia Ware is getting tired of eating same food. Did eat some [employer] the other day. She is getting use to cottage cheese. Getting almost all food in. Going to brunch with siblings in 4 days. Celebrating with coworkers tomorrow and her family.  Subjective:   1. Vitamin D deficiency Julia Ware is currently taking prescription Vit D 50,000 IU once a week. Denies any nausea, vomiting or muscle weakness. She notes fatigue.  2. Resistant hypertension Julia Ware's blood pressure is elevated again today but less 80. Denies chest pain, chest pressure and headache.  Assessment/Plan:   1. Vitamin D deficiency We will refill Vit D 50K IU once a week for 1 month with 0 refills.  -Refill Vitamin D, Ergocalciferol, (DRISDOL) 1.25 MG (50000 UNIT) CAPS capsule; Take 1 capsule (50,000 Units total) by mouth every 7 (seven) days.  Dispense: 4 capsule; Refill: 0  2. Resistant hypertension Start Norvasc 5 mg by mouth daily for 1 month with 0 refills.  -Start amLODipine (NORVASC) 5 MG tablet; Take 1 tablet (5 mg total) by mouth daily.  Dispense: 30 tablet; Refill: 0  3. Obesity with current BMI of 58.1 Julia Ware is currently in the action stage of change. As such, her goal is to continue with weight loss efforts. She has agreed to the Category 3 Plan.   Exercise goals: Older adults should follow the adult guidelines. When older adults cannot meet the adult guidelines, they should be as physically  active as their abilities and conditions will allow.   Behavioral modification strategies: increasing lean protein intake, meal planning and cooking strategies, keeping healthy foods in the home, and planning for success.  Julia Ware has agreed to follow-up with our clinic in 3 weeks. She was informed of the importance of frequent follow-up visits to maximize her success with intensive lifestyle modifications for her multiple health conditions.   Objective:   Blood pressure (!) 156/84, pulse (!) 101, temperature 98.6 F (37 C), height '5\' 5"'$  (1.651 m), weight (!) 349 lb (158.3 kg), SpO2 100 %. Body mass index is 58.08 kg/m.  General: Cooperative, alert, well developed, in no acute distress. HEENT: Conjunctivae and lids unremarkable. Cardiovascular: Regular rhythm.  Lungs: Normal work of breathing. Neurologic: No focal deficits.   Lab Results  Component Value Date   CREATININE 0.97 04/29/2022   BUN 13 04/29/2022   NA 139 04/29/2022   K 4.0 04/29/2022   CL 100 04/29/2022   CO2 26 04/29/2022   Lab Results  Component Value Date   ALT 13 04/29/2022   AST 13 04/29/2022   ALKPHOS 65 04/29/2022   BILITOT 0.5 04/29/2022   Lab Results  Component Value Date   HGBA1C 5.4 04/29/2022   Lab Results  Component Value Date   INSULIN 13.9 04/29/2022   Lab Results  Component Value Date   TSH 0.40 03/03/2022   Lab Results  Component Value Date   CHOL 178 04/29/2022   HDL 60 04/29/2022   [place]  100 (H) 04/29/2022   TRIG 97 04/29/2022   Lab Results  Component Value Date   VD25OH 30.0 04/29/2022   Lab Results  Component Value Date   WBC 7.4 04/29/2022   HGB 13.3 04/29/2022   HCT 40.2 04/29/2022   MCV 78 (L) 04/29/2022   PLT 203 04/29/2022   Lab Results  Component Value Date   IRON 43 04/29/2022   TIBC 315 04/29/2022   FERRITIN 374 (H) 04/29/2022   Attestation Statements:   Reviewed by clinician on day of visit: allergies, medications, problem list, medical history,  surgical history, family history, social history, and previous encounter notes.  I, Elnora Morrison, RMA am acting as transcriptionist for Coralie Common, MD.  I have reviewed the above documentation for accuracy and completeness, and I agree with the above. - Coralie Common, MD

## 2022-06-25 ENCOUNTER — Ambulatory Visit (INDEPENDENT_AMBULATORY_CARE_PROVIDER_SITE_OTHER): Payer: Medicare Other | Admitting: Family Medicine

## 2022-06-25 ENCOUNTER — Encounter (INDEPENDENT_AMBULATORY_CARE_PROVIDER_SITE_OTHER): Payer: Self-pay | Admitting: Family Medicine

## 2022-06-25 VITALS — BP 142/68 | HR 71 | Temp 97.7°F | Ht 65.0 in | Wt 347.0 lb

## 2022-06-25 DIAGNOSIS — E559 Vitamin D deficiency, unspecified: Secondary | ICD-10-CM | POA: Diagnosis not present

## 2022-06-25 DIAGNOSIS — Z6841 Body Mass Index (BMI) 40.0 and over, adult: Secondary | ICD-10-CM

## 2022-06-25 DIAGNOSIS — E66813 Obesity, class 3: Secondary | ICD-10-CM

## 2022-06-25 DIAGNOSIS — E669 Obesity, unspecified: Secondary | ICD-10-CM

## 2022-06-25 DIAGNOSIS — I1A0 Resistant hypertension: Secondary | ICD-10-CM | POA: Diagnosis not present

## 2022-06-25 MED ORDER — AMLODIPINE BESYLATE 5 MG PO TABS: 5.0000 mg | ORAL_TABLET | Freq: Every day | ORAL | 0 refills | Status: DC

## 2022-06-25 MED ORDER — VITAMIN D (ERGOCALCIFEROL) 1.25 MG (50000 UNIT) PO CAPS: 50000.0000 [IU] | ORAL_CAPSULE | ORAL | 0 refills | Status: DC

## 2022-07-08 NOTE — Progress Notes (Signed)
Chief Complaint:   OBESITY Julia Ware is here to discuss her progress with her obesity treatment plan along with follow-up of her obesity related diagnoses. Julia Ware is on the Category 3 Plan and states she is following her eating plan approximately 40% of the time. Julia Ware states she is exercising 0 minutes 0 times per week.  Today's visit was #: 4 Starting weight: 359 lbs Starting date: 04/29/2022 Today's weight: 347 lbs Today's date: 06/25/2022 Total lbs lost to date: 12 lbs Total lbs lost since last in-office visit: 2  Interim History: Julia Ware has had a PCP since her last appointment.  Feels she "messed up" over the holiday because she baked and cooked indulgently. Did pull herself back on to category 3 after the holidays.  Started Mounjaro at last appointment and is noticing early satiety.  Subjective:   1. Resistant hypertension PCP offered Amlodipine twice a day but Julia Ware is only on it for a few days.  Denies chest pain, chest pressure and headache.  She is also on Zestoretic 2 tabs daily.  2. Vitamin D deficiency Julia Ware is currently taking prescription Vit D 50,000 IU once a week.  Denies any nausea, vomiting or muscle weakness.  Julia Ware notes fatigue.  Assessment/Plan:   1. Resistant hypertension We will refill Amlodipine 5 mg daily for 3 months with 0 refills.  -Refill amLODipine (NORVASC) 5 MG tablet; Take 1 tablet (5 mg total) by mouth daily.  Dispense: 90 tablet; Refill: 0  2. Vitamin D deficiency We will refill Vit D 50K IU once a week for 1 month with 0 refills.  -Refill Vitamin D, Ergocalciferol, (DRISDOL) 1.25 MG (50000 UNIT) CAPS capsule; Take 1 capsule (50,000 Units total) by mouth every 7 (seven) days.  Dispense: 4 capsule; Refill: 0  3. Obesity with current BMI of 57.7 Julia Ware is currently in the action stage of change. As such, her goal is to continue with weight loss efforts. She has agreed to the Category 3 Plan.   Exercise goals: No exercise has been prescribed at  this time.  Behavioral modification strategies: increasing lean protein intake, meal planning and cooking strategies, keeping healthy foods in the home, and planning for success.  Julia Ware has agreed to follow-up with our clinic in 3 weeks. She was informed of the importance of frequent follow-up visits to maximize her success with intensive lifestyle modifications for her multiple health conditions.   Objective:   Blood pressure (!) 142/68, pulse 71, temperature 97.7 F (36.5 C), height '5\' 5"'$  (1.651 m), weight (!) 347 lb (157.4 kg), SpO2 97 %. Body mass index is 57.74 kg/m.  General: Cooperative, alert, well developed, in no acute distress. HEENT: Conjunctivae and lids unremarkable. Cardiovascular: Regular rhythm.  Lungs: Normal work of breathing. Neurologic: No focal deficits.   Lab Results  Component Value Date   CREATININE 0.97 04/29/2022   BUN 13 04/29/2022   NA 139 04/29/2022   K 4.0 04/29/2022   CL 100 04/29/2022   CO2 26 04/29/2022   Lab Results  Component Value Date   ALT 13 04/29/2022   AST 13 04/29/2022   ALKPHOS 65 04/29/2022   BILITOT 0.5 04/29/2022   Lab Results  Component Value Date   HGBA1C 5.4 04/29/2022   Lab Results  Component Value Date   INSULIN 13.9 04/29/2022   Lab Results  Component Value Date   TSH 0.40 03/03/2022   Lab Results  Component Value Date   CHOL 178 04/29/2022   HDL 60 04/29/2022  LDLCALC 100 (H) 04/29/2022   TRIG 97 04/29/2022   Lab Results  Component Value Date   VD25OH 30.0 04/29/2022   Lab Results  Component Value Date   WBC 7.4 04/29/2022   HGB 13.3 04/29/2022   HCT 40.2 04/29/2022   MCV 78 (L) 04/29/2022   PLT 203 04/29/2022   Lab Results  Component Value Date   IRON 43 04/29/2022   TIBC 315 04/29/2022   FERRITIN 374 (H) 04/29/2022   Attestation Statements:   Reviewed by clinician on day of visit: allergies, medications, problem list, medical history, surgical history, family history, social history, and  previous encounter notes.  I, Elnora Morrison, RMA am acting as transcriptionist for Coralie Common, MD.  I have reviewed the above documentation for accuracy and completeness, and I agree with the above. - Coralie Common, MD

## 2022-07-16 ENCOUNTER — Ambulatory Visit (INDEPENDENT_AMBULATORY_CARE_PROVIDER_SITE_OTHER): Payer: Medicare Other | Admitting: Family Medicine

## 2022-07-22 ENCOUNTER — Ambulatory Visit (INDEPENDENT_AMBULATORY_CARE_PROVIDER_SITE_OTHER): Payer: Medicare Other | Admitting: Adult Health

## 2022-07-22 ENCOUNTER — Encounter (INDEPENDENT_AMBULATORY_CARE_PROVIDER_SITE_OTHER): Payer: Self-pay | Admitting: Adult Health

## 2022-07-22 VITALS — BP 131/84 | HR 70 | Temp 97.7°F | Ht 65.0 in | Wt 342.0 lb

## 2022-07-22 DIAGNOSIS — R630 Anorexia: Secondary | ICD-10-CM | POA: Diagnosis not present

## 2022-07-22 DIAGNOSIS — E559 Vitamin D deficiency, unspecified: Secondary | ICD-10-CM | POA: Diagnosis not present

## 2022-07-22 DIAGNOSIS — E669 Obesity, unspecified: Secondary | ICD-10-CM

## 2022-07-22 DIAGNOSIS — E66813 Obesity, class 3: Secondary | ICD-10-CM

## 2022-07-22 DIAGNOSIS — I1A0 Resistant hypertension: Secondary | ICD-10-CM

## 2022-07-22 DIAGNOSIS — Z6841 Body Mass Index (BMI) 40.0 and over, adult: Secondary | ICD-10-CM | POA: Insufficient documentation

## 2022-07-22 MED ORDER — VITAMIN D (ERGOCALCIFEROL) 1.25 MG (50000 UNIT) PO CAPS: 50000.0000 [IU] | ORAL_CAPSULE | ORAL | 0 refills | Status: DC

## 2022-07-22 MED ORDER — LISINOPRIL-HYDROCHLOROTHIAZIDE 20-25 MG PO TABS: 2.0000 | ORAL_TABLET | Freq: Every day | ORAL | 0 refills | Status: DC

## 2022-07-23 ENCOUNTER — Ambulatory Visit (INDEPENDENT_AMBULATORY_CARE_PROVIDER_SITE_OTHER): Payer: Medicare Other | Admitting: Family Medicine

## 2022-08-04 NOTE — Progress Notes (Unsigned)
Chief Complaint:   OBESITY Julia Ware is here to discuss her progress with her obesity treatment plan along with follow-up of her obesity related diagnoses. Julia Ware is on the Category 3 Plan and states she is following her eating plan approximately 40% of the time. Julia Ware states she is not exercising.   Today's visit was #: 5 Starting weight: 359 lbs Starting date: 04/29/2022 Today's weight: 342 lbs Today's date: 07/22/2022 Total lbs lost to date: 17 lbs Total lbs lost since last in-office visit: 5 lbs  Interim History: ***  Subjective:   1. Resistant hypertension Patient is currently on amlodipine 5 mg daily, lisinopril/HCTZ 20/25 mg 2 tabs daily, this was increased on 05/13/2022 due to uncontrolled hypertension.  On 04/29/2022 CMP, electrolytes/kidney function stable.  2. Vitamin D deficiency On 04/29/2022, vitamin D level was 30.0, below goal of 50-70.  Patient is on weekly ergocalciferol.  Patient denies nausea, vomiting, muscle weakness.  3. Poor appetite ***   Assessment/Plan:   1. Resistant hypertension Limit sodium intake.  Refill- lisinopril-hydrochlorothiazide (ZESTORETIC) 20-25 MG tablet; Take 2 tablets by mouth daily.  Dispense: 180 tablet; Refill: 0  2. Vitamin D deficiency Refill- Vitamin D, Ergocalciferol, (DRISDOL) 1.25 MG (50000 UNIT) CAPS capsule; Take 1 capsule (50,000 Units total) by mouth every 7 (seven) days.  Dispense: 4 capsule; Refill: 0  3. Poor appetite Patient has been advised to split up medication, try to consume all prescribed food during the day, timing is not important.  4. Obesity with current BMI of 56.9 Julia Ware is currently in the action stage of change. As such, her goal is to continue with weight loss efforts. She has agreed to the Category 3 Plan.   Exercise goals: All adults should avoid inactivity. Some physical activity is better than none, and adults who participate in any amount of physical activity gain some health  benefits.  Behavioral modification strategies: increasing lean protein intake, decreasing simple carbohydrates, meal planning and cooking strategies, keeping healthy foods in the home, and planning for success.  Julia Ware has agreed to follow-up with our clinic in 4 weeks. She was informed of the importance of frequent follow-up visits to maximize her success with intensive lifestyle modifications for her multiple health conditions.   Objective:   Blood pressure 131/84, pulse 70, temperature 97.7 F (36.5 C), height 5' 5"$  (1.651 m), weight (!) 342 lb (155.1 kg), SpO2 99 %. Body mass index is 56.91 kg/m.  General: Cooperative, alert, well developed, in no acute distress. HEENT: Conjunctivae and lids unremarkable. Cardiovascular: Regular rhythm.  Lungs: Normal work of breathing. Neurologic: No focal deficits.   Lab Results  Component Value Date   CREATININE 0.97 04/29/2022   BUN 13 04/29/2022   NA 139 04/29/2022   K 4.0 04/29/2022   CL 100 04/29/2022   CO2 26 04/29/2022   Lab Results  Component Value Date   ALT 13 04/29/2022   AST 13 04/29/2022   ALKPHOS 65 04/29/2022   BILITOT 0.5 04/29/2022   Lab Results  Component Value Date   HGBA1C 5.4 04/29/2022   Lab Results  Component Value Date   INSULIN 13.9 04/29/2022   Lab Results  Component Value Date   TSH 0.40 03/03/2022   Lab Results  Component Value Date   CHOL 178 04/29/2022   HDL 60 04/29/2022   LDLCALC 100 (H) 04/29/2022   TRIG 97 04/29/2022   Lab Results  Component Value Date   VD25OH 30.0 04/29/2022   Lab Results  Component Value  Date   WBC 7.4 04/29/2022   HGB 13.3 04/29/2022   HCT 40.2 04/29/2022   MCV 78 (L) 04/29/2022   PLT 203 04/29/2022   Lab Results  Component Value Date   IRON 43 04/29/2022   TIBC 315 04/29/2022   FERRITIN 374 (H) 04/29/2022    Attestation Statements:   Reviewed by clinician on day of visit: allergies, medications, problem list, medical history, surgical history,  family history, social history, and previous encounter notes.  I, Davy Pique, RMA, am acting as Location manager for Mina Marble, NP.  I have reviewed the above documentation for accuracy and completeness, and I agree with the above. -  ***

## 2022-08-26 ENCOUNTER — Encounter (INDEPENDENT_AMBULATORY_CARE_PROVIDER_SITE_OTHER): Payer: Self-pay | Admitting: Adult Health

## 2022-08-26 ENCOUNTER — Ambulatory Visit (INDEPENDENT_AMBULATORY_CARE_PROVIDER_SITE_OTHER): Payer: Medicare Other | Admitting: Adult Health

## 2022-08-26 VITALS — BP 168/94 | HR 60 | Temp 97.9°F | Ht 65.0 in | Wt 340.0 lb

## 2022-08-26 DIAGNOSIS — I1A0 Resistant hypertension: Secondary | ICD-10-CM | POA: Diagnosis not present

## 2022-08-26 DIAGNOSIS — E88819 Insulin resistance, unspecified: Secondary | ICD-10-CM | POA: Diagnosis not present

## 2022-08-26 DIAGNOSIS — Z6841 Body Mass Index (BMI) 40.0 and over, adult: Secondary | ICD-10-CM

## 2022-08-26 DIAGNOSIS — E559 Vitamin D deficiency, unspecified: Secondary | ICD-10-CM | POA: Diagnosis not present

## 2022-08-26 MED ORDER — COMFORT TOUCH BP CUFF/LARGE MISC: 1.0000 [IU] | Freq: Every day | 0 refills | Status: AC

## 2022-08-26 MED ORDER — VITAMIN D (ERGOCALCIFEROL) 1.25 MG (50000 UNIT) PO CAPS: 50000.0000 [IU] | ORAL_CAPSULE | ORAL | 0 refills | Status: DC

## 2022-08-26 NOTE — Progress Notes (Signed)
WEIGHT SUMMARY AND BIOMETRICS  Vitals Temp: 97.9 F (36.6 C) BP: (!) 168/94 Pulse Rate: 60 SpO2: 99 %   Anthropometric Measurements Height: '5\' 5"'$  (1.651 m) Weight: (!) 340 lb (154.2 kg) BMI (Calculated): 56.58 Weight at Last Visit: 342lb Weight Lost Since Last Visit: 2lb Starting Weight: 359lb Total Weight Loss (lbs): 19 lb (8.618 kg)   Body Composition  Body Fat %: 60 % Fat Mass (lbs): 204 lbs Muscle Mass (lbs): 129.2 lbs Visceral Fat Rating : 27   Other Clinical Data Fasting: no Labs: no Today's Visit #: 6 Starting Date: 04/29/22    Chief Complaint:   OBESITY Julia Ware is here to discuss her progress with her obesity treatment plan. She is on the the Category 3 Plan and states she is following her eating plan approximately 30-40 % of the time.  She states she is exercising walking 10 minutes 2 times per week.   Interim History:  3 months of Mounjaro therapy- she reports early satiety 1 month of Mounjaro 2.'5mg'$  2 months of Mounjaro '5mg'$  She has home supply of 2 more months of Mounjaro '5mg'$    PCP- Arthur Holms NP-C manages Mounjaro therapy.  She would like to be losing weight more rapidly.  She estimates to follow Cat 3 meal plan 30-40%, recommend increasing compliance to at least 80%.  Subjective:   1. Resistant hypertension AM- Coreg 6.'25mg'$  and Lisinopril/HCTZ 20/'25mg'$  PM- Coreg 6.'25mg'$  and Lisinopril/HCTZ 20/'25mg'$  and Norvasc '5mg'$   She has not been checking home BP- she has a wrist cuff sphygmomanometer  Family hx of HTN- mother and her sisters.  BP well above goal- she denies acute cardiac sx's at present.  2. Vitamin D deficiency  Latest Reference Range & Units 04/29/22 10:05  Vitamin D, 25-Hydroxy 30.0 - 100.0 ng/mL 30.0  She is on weekly Ergocalciferol- she denies N/V/Muscle Weakness  3. Insulin resistance  Latest Reference Range & Units 04/29/22 10:05  Glucose 70 - 99 mg/dL 88  Hemoglobin A1C 4.8 - 5.6 % 5.4  Est. average glucose Bld gHb  Est-mCnc mg/dL 108  INSULIN 2.6 - 24.9 uIU/mL 13.9  Her mother developed T2D in her late 28s. None of her siblings have T2D PCP provided her with [facility]- she is currently on '5mg'$  strength. Denies mass in neck, dysphagia, dyspepsia, persistent hoarseness, abdominal pain, or N/V/C   Assessment/Plan:   1. Resistant hypertension Continue current antihypertenive regime and monitor BP at home. Log provided- please bring to next OV.  Sent in Rx for Large upper arm BP cuff.  Check labs at next OV  2. Vitamin D deficiency Refill Ergocalciferol 50,000 IU once week Disp 4 RR 0 Check labs at next OV  3. Insulin resistance Continue Mounjaro per PCP Check Labs at next OV  4. Obesity with current BMI of 56.58  [name] is currently in the action stage of change. As such, her goal is to continue with weight loss efforts. She has agreed to the Category 3 Plan.   Exercise goals:  Continue to increase daily walking- distance and time.  Behavioral modification strategies: increasing lean protein intake, decreasing simple carbohydrates, increasing vegetables, increasing water intake, decreasing eating out, meal planning and cooking strategies, keeping healthy foods in the home, and planning for success.  Raashida has agreed to follow-up with our clinic in 3 weeks. She was informed of the importance of frequent follow-up visits to maximize her success with intensive lifestyle modifications for her multiple health conditions.   Check FASTING LABS at next  OV  Objective:   Blood pressure (!) 168/94, pulse 60, temperature 97.9 F (36.6 C), height '5\' 5"'$  (1.651 m), weight (!) 340 lb (154.2 kg), SpO2 99 %. Body mass index is 56.58 kg/m.  General: Cooperative, alert, well developed, in no acute distress. HEENT: Conjunctivae and lids unremarkable. Cardiovascular: Regular rhythm.  Lungs: Normal work of breathing. Neurologic: No focal deficits.   Lab Results  Component Value Date   CREATININE 0.97  04/29/2022   BUN 13 04/29/2022   NA 139 04/29/2022   K 4.0 04/29/2022   CL 100 04/29/2022   CO2 26 04/29/2022   Lab Results  Component Value Date   ALT 13 04/29/2022   AST 13 04/29/2022   ALKPHOS 65 04/29/2022   BILITOT 0.5 04/29/2022   Lab Results  Component Value Date   HGBA1C 5.4 04/29/2022   Lab Results  Component Value Date   INSULIN 13.9 04/29/2022   Lab Results  Component Value Date   TSH 0.40 03/03/2022   Lab Results  Component Value Date   CHOL 178 04/29/2022   HDL 60 04/29/2022   LDLCALC 100 (H) 04/29/2022   TRIG 97 04/29/2022   Lab Results  Component Value Date   VD25OH 30.0 04/29/2022   Lab Results  Component Value Date   WBC 7.4 04/29/2022   HGB 13.3 04/29/2022   HCT 40.2 04/29/2022   MCV 78 (L) 04/29/2022   PLT 203 04/29/2022   Lab Results  Component Value Date   IRON 43 04/29/2022   TIBC 315 04/29/2022   FERRITIN 374 (H) 04/29/2022    Attestation Statements:   Reviewed by clinician on day of visit: allergies, medications, problem list, medical history, surgical history, family history, social history, and previous encounter notes.  I have reviewed the above documentation for accuracy and completeness, and I agree with the above. -  Tenae Graziosi d. Devaris Quirk, NP-C

## 2022-09-29 ENCOUNTER — Encounter (INDEPENDENT_AMBULATORY_CARE_PROVIDER_SITE_OTHER): Payer: Self-pay | Admitting: Adult Health

## 2022-09-29 ENCOUNTER — Ambulatory Visit (INDEPENDENT_AMBULATORY_CARE_PROVIDER_SITE_OTHER): Payer: Medicare Other | Admitting: Adult Health

## 2022-09-29 VITALS — BP 102/60 | HR 90 | Temp 98.6°F | Ht 65.0 in | Wt 339.0 lb

## 2022-09-29 DIAGNOSIS — E559 Vitamin D deficiency, unspecified: Secondary | ICD-10-CM | POA: Diagnosis not present

## 2022-09-29 DIAGNOSIS — E88819 Insulin resistance, unspecified: Secondary | ICD-10-CM | POA: Diagnosis not present

## 2022-09-29 DIAGNOSIS — I1A0 Resistant hypertension: Secondary | ICD-10-CM | POA: Diagnosis not present

## 2022-09-29 DIAGNOSIS — E669 Obesity, unspecified: Secondary | ICD-10-CM | POA: Diagnosis not present

## 2022-09-29 DIAGNOSIS — Z6841 Body Mass Index (BMI) 40.0 and over, adult: Secondary | ICD-10-CM

## 2022-09-29 MED ORDER — VITAMIN D (ERGOCALCIFEROL) 1.25 MG (50000 UNIT) PO CAPS: 50000.0000 [IU] | ORAL_CAPSULE | ORAL | 0 refills | Status: DC

## 2022-09-29 MED ORDER — AMLODIPINE BESYLATE 5 MG PO TABS: 5.0000 mg | ORAL_TABLET | Freq: Every day | ORAL | 0 refills | Status: AC

## 2022-09-29 NOTE — Progress Notes (Signed)
WEIGHT SUMMARY AND BIOMETRICS  Vitals Temp: 98.6 F (37 C) BP: 102/60 Pulse Rate: 90 SpO2: 98 %   Anthropometric Measurements Height:  (1.651 m) Weight: (!) 339 lb (153.8 kg) BMI (Calculated): 56.41 Weight at Last Visit: 340lb Weight Lost Since Last Visit: 1lb Weight Gained Since Last Visit: 0 Starting Weight: 359lb Total Weight Loss (lbs): 20 lb (9.072 kg)   Body Composition  Body Fat %: 57.6 % Fat Mass (lbs): 195.2 lbs Muscle Mass (lbs): 136.6 lbs Visceral Fat Rating : 26   Other Clinical Data Fasting: Yes Labs: Yes Today's Visit #: 7 Starting Date: 04/29/22    Chief Complaint:   OBESITY Julia Ware is here to discuss her progress with her obesity treatment plan. She is on the the Category 3 Plan and states she is following her eating plan approximately 30 % of the time.  She states she is exercising walking 10 minutes 2-3 times per week.   Interim History:  PCP manages Mounjaro  once weekly injection Denies mass in neck, dysphagia, dyspepsia, persistent hoarseness, abdominal pain, or N/V/C   She recently celebrated her birthday- family bestowed her a cake from [employer]  Subjective:   1. Insulin resistance  Latest Reference Range & Units 04/29/22 10:05  INSULIN 2.6 - 24.9 uIU/mL 13.9  She has been on Mounjaro  once weekly injection for the last 3 months Denies mass in neck, dysphagia, dyspepsia, persistent hoarseness, abdominal pain, or N/V/C   2. Vitamin D deficiency  Latest Reference Range & Units 04/29/22 10:05  Vitamin D, 25-Hydroxy 30.0 - 100.0 ng/mL 30.0  She is on weekly Ergocalciferol- denies N/V/Muscle Weakness  3. Resistant hypertension BP excellent at OV She has been checking BP at home- forgot home reading sheet. She did obtain correct cuff, applies to FA, unable to apply to upper arm due to circumference. She denies CP when walking. Currently on: carvedilol (COREG) 6.25 MG tablet   lisinopril-hydrochlorothiazide (ZESTORETIC) 20-25 MG tablet  amLODipine (NORVASC) 5 MG tablet    Assessment/Plan:   1. Insulin resistance Check Labs - Insulin, random - Hemoglobin A1c  2. Vitamin D deficiency Check Labs and Refill - Vitamin D, Ergocalciferol, (DRISDOL) 1.25 MG (50000 UNIT) CAPS capsule; Take 1 capsule (50,000 Units total) by mouth every 7 (seven) days.  Dispense: 4 capsule; Refill: 0 - VITAMIN D 25 Hydroxy (Vit-D Deficiency, Fractures)  3. Resistant hypertension Check Labs and Refill - amLODipine (NORVASC) 5 MG tablet; Take 1 tablet (5 mg total) by mouth daily.  Dispense: 90 tablet; Refill: 0 - Comprehensive metabolic panel; Future  4. Obesity with current BMI of 56.7  Julia Ware is currently in the action stage of change. As such, her goal is to continue with weight loss efforts. She has agreed to the Category 3 Plan.   Exercise goals: Older adults should follow the adult guidelines. When older adults cannot meet the adult guidelines, they should be as physically active as their abilities and conditions will allow.  Older adults should do exercises that maintain or improve balance if they are at risk of falling.  Older adults with chronic conditions should understand whether and how their conditions affect their ability to do regular physical activity safely.  Behavioral modification strategies: increasing lean protein intake, decreasing simple carbohydrates, increasing vegetables, increasing water intake, and planning for success.  Julia Ware has agreed to follow-up with our clinic in 4 weeks. She was informed of the importance of frequent follow-up visits to maximize her success with  intensive lifestyle modifications for her multiple health conditions.   Julia Ware was informed we would discuss her lab results at her next visit unless there is a critical issue that needs to be addressed sooner. Julia Ware agreed to keep her next visit at the agreed upon time to discuss these  results.  Objective:   Blood pressure 102/60, pulse 90, temperature 98.6 F (37 C), height 5\' 5"  (1.651 m), weight (!) 339 lb (153.8 kg), SpO2 98 %. Body mass index is 56.41 kg/m.  General: Cooperative, alert, well developed, in no acute distress. HEENT: Conjunctivae and lids unremarkable. Cardiovascular: Regular rhythm.  Lungs: Normal work of breathing. Neurologic: No focal deficits.   Lab Results  Component Value Date   CREATININE 0.97 04/29/2022   BUN 13 04/29/2022   NA 139 04/29/2022   K 4.0 04/29/2022   CL 100 04/29/2022   CO2 26 04/29/2022   Lab Results  Component Value Date   ALT 13 04/29/2022   AST 13 04/29/2022   ALKPHOS 65 04/29/2022   BILITOT 0.5 04/29/2022   Lab Results  Component Value Date   HGBA1C 5.4 04/29/2022   Lab Results  Component Value Date   INSULIN 13.9 04/29/2022   Lab Results  Component Value Date   TSH 0.40 03/03/2022   Lab Results  Component Value Date   CHOL 178 04/29/2022   HDL 60 04/29/2022   LDLCALC 100 (H) 04/29/2022   TRIG 97 04/29/2022   Lab Results  Component Value Date   VD25OH 30.0 04/29/2022   Lab Results  Component Value Date   WBC 7.4 04/29/2022   HGB 13.3 04/29/2022   HCT 40.2 04/29/2022   MCV 78 (L) 04/29/2022   PLT 203 04/29/2022   Lab Results  Component Value Date   IRON 43 04/29/2022   TIBC 315 04/29/2022   FERRITIN 374 (H) 04/29/2022    Attestation Statements:   Reviewed by clinician on day of visit: allergies, medications, problem list, medical history, surgical history, family history, social history, and previous encounter notes.  I have reviewed the above documentation for accuracy and completeness, and I agree with the above. -  Julia Chambers d. Gibson Telleria, NP-C

## 2022-09-30 LAB — VITAMIN D 25 HYDROXY (VIT D DEFICIENCY, FRACTURES): Vit D, 25-Hydroxy: 39.3 ng/mL (ref 30.0–100.0)

## 2022-09-30 LAB — HEMOGLOBIN A1C
Est. average glucose Bld gHb Est-mCnc: 108 mg/dL
Hgb A1c MFr Bld: 5.4 % (ref 4.8–5.6)

## 2022-09-30 LAB — INSULIN, RANDOM: INSULIN: 14.8 u[IU]/mL (ref 2.6–24.9)

## 2022-10-27 ENCOUNTER — Ambulatory Visit (INDEPENDENT_AMBULATORY_CARE_PROVIDER_SITE_OTHER): Payer: Medicare Other | Admitting: Adult Health

## 2022-10-31 ENCOUNTER — Other Ambulatory Visit (INDEPENDENT_AMBULATORY_CARE_PROVIDER_SITE_OTHER): Payer: Self-pay | Admitting: Adult Health

## 2022-10-31 DIAGNOSIS — I1A0 Resistant hypertension: Secondary | ICD-10-CM

## 2022-11-03 ENCOUNTER — Encounter (INDEPENDENT_AMBULATORY_CARE_PROVIDER_SITE_OTHER): Payer: Self-pay | Admitting: Adult Health

## 2022-11-03 ENCOUNTER — Ambulatory Visit (INDEPENDENT_AMBULATORY_CARE_PROVIDER_SITE_OTHER): Payer: Medicare Other | Admitting: Adult Health

## 2022-11-03 VITALS — BP 158/58 | HR 53 | Temp 97.6°F | Ht 65.0 in | Wt 336.0 lb

## 2022-11-03 DIAGNOSIS — E669 Obesity, unspecified: Secondary | ICD-10-CM

## 2022-11-03 DIAGNOSIS — E559 Vitamin D deficiency, unspecified: Secondary | ICD-10-CM

## 2022-11-03 DIAGNOSIS — E88819 Insulin resistance, unspecified: Secondary | ICD-10-CM

## 2022-11-03 DIAGNOSIS — I1A0 Resistant hypertension: Secondary | ICD-10-CM | POA: Diagnosis not present

## 2022-11-03 DIAGNOSIS — Z6841 Body Mass Index (BMI) 40.0 and over, adult: Secondary | ICD-10-CM

## 2022-11-03 MED ORDER — LISINOPRIL-HYDROCHLOROTHIAZIDE 20-25 MG PO TABS: 2.0000 | ORAL_TABLET | Freq: Every day | ORAL | 0 refills | Status: DC

## 2022-11-03 MED ORDER — VITAMIN D (ERGOCALCIFEROL) 1.25 MG (50000 UNIT) PO CAPS
50000.0000 [IU] | ORAL_CAPSULE | ORAL | 0 refills | Status: DC
Start: 2022-11-03 — End: 2022-12-02

## 2022-11-03 NOTE — Progress Notes (Signed)
WEIGHT SUMMARY AND BIOMETRICS  Vitals Temp: 97.6 F (36.4 C) BP: (!) 158/58 Pulse Rate: (!) 53 SpO2: 100 %   Anthropometric Measurements Height: 5\' 5"  (1.651 m) Weight: (!) 336 lb (152.4 kg) BMI (Calculated): 55.91 Weight at Last Visit: 339lb Weight Lost Since Last Visit: 3lb Weight Gained Since Last Visit: 0 Starting Weight: 359 Total Weight Loss (lbs): 23 lb (10.4 kg)   Body Composition  Body Fat %: 61.5 % Fat Mass (lbs): 206.8 lbs Muscle Mass (lbs): 122.8 lbs Visceral Fat Rating : 27   Other Clinical Data Fasting: no Labs: no Today's Visit #: 8 Starting Date: 04/29/22    Chief Complaint:   OBESITY Julia Ware is here to discuss her progress with her obesity treatment plan. She is on the the Category 3 Plan and states she is following her eating plan approximately 25 % of the time. She states she is exercising walking 10 minutes 2 times per week.   Interim History:  Ms. Soderman has been off Mounjaro 5mg  since 09/29/2022 due to [employer]. PCP managed GIP/GLP-1 therapy. Hunger/appetite-she reports increase in late afternoon polyphagia since off Mounjaro therapy. Exercise-walking has been difficult due to bilateral knee pain Hydration-she estimates to drink 10-20 oz water/day  Subjective:   1. Insulin resistance Discussed Labs  Latest Reference Range & Units 04/29/22 10:05 09/29/22 09:33  INSULIN 2.6 - 24.9 uIU/mL 13.9 14.8  Ms. Dowse has been off Mounjaro 5mg  since 09/29/2022 due to [employer]. She reports increase in late afternoon polyphagia since off [facility] therapy. Discussed risks/benefits of restarting Mounjaro 5mg  therapy- just picked up 5mg  refill supply  2. Resistant hypertension carvedilol (COREG) 6.25 MG tablet - BID  amLODipine (NORVASC) 5 MG tablet- EVENING DOSE  lisinopril-hydrochlorothiazide (ZESTORETIC) 20-25 MG tablet  2 TABS IN AM  She provided BP readings log SBP 120-160s DBP 70-90 She denies  CP/palpitations with exertion. She denies lower extremity edema.  3. Vitamin D deficiency Discussed Labs  Latest Reference Range & Units 04/29/22 10:05 09/29/22 09:33  Vitamin D, 25-Hydroxy 30.0 - 100.0 ng/mL 30.0 39.3   She is weekly Ergocalciferol- denies N/V/Muscle Weakness  Assessment/Plan:   1. Insulin resistance ENsure proper protein intake at each meals and at snacks. Contact PCP about re-starting Mounjaro therapy.  2. Resistant hypertension Refill - lisinopril-hydrochlorothiazide (ZESTORETIC) 20-25 MG tablet; Take 2 tablets by mouth daily.  Dispense: 180 tablet; Refill: 0  3. Vitamin D deficiency Refill - Vitamin D, Ergocalciferol, (DRISDOL) 1.25 MG (50000 UNIT) CAPS capsule; Take 1 capsule (50,000 Units total) by mouth every 7 (seven) days.  Dispense: 4 capsule; Refill: 0  4. Obesity with current BMI of 55.91  Laelah is currently in the action stage of change. As such, her goal is to continue with weight loss efforts. She has agreed to the Category 3 Plan.   Exercise goals: Older adults should follow the adult guidelines. When older adults cannot meet the adult guidelines, they should be as physically active as their abilities and conditions will allow.   Behavioral modification strategies: increasing lean protein intake, decreasing simple carbohydrates, increasing vegetables, increasing water intake, no skipping meals, meal planning and cooking strategies, avoiding temptations, and planning for success.  Domineque has agreed to follow-up with our clinic in 4 weeks. She was informed of the importance of frequent follow-up visits to maximize her success with intensive lifestyle modifications for her multiple health conditions.   Objective:   Blood pressure (!) 158/58, pulse (!) 53, temperature 97.6 F (36.4  C), height 5\' 5"  (1.651 m), weight (!) 336 lb (152.4 kg), SpO2 100 %. Body mass index is 55.91 kg/m.  General: Cooperative, alert, well developed, in no acute  distress. HEENT: Conjunctivae and lids unremarkable. Cardiovascular: Regular rhythm.  Lungs: Normal work of breathing. Neurologic: No focal deficits.   Lab Results  Component Value Date   CREATININE 0.97 04/29/2022   BUN 13 04/29/2022   NA 139 04/29/2022   K 4.0 04/29/2022   CL 100 04/29/2022   CO2 26 04/29/2022   Lab Results  Component Value Date   ALT 13 04/29/2022   AST 13 04/29/2022   ALKPHOS 65 04/29/2022   BILITOT 0.5 04/29/2022   Lab Results  Component Value Date   HGBA1C 5.4 09/29/2022   HGBA1C 5.4 04/29/2022   Lab Results  Component Value Date   INSULIN 14.8 09/29/2022   INSULIN 13.9 04/29/2022   Lab Results  Component Value Date   TSH 0.40 03/03/2022   Lab Results  Component Value Date   CHOL 178 04/29/2022   HDL 60 04/29/2022   LDLCALC 100 (H) 04/29/2022   TRIG 97 04/29/2022   Lab Results  Component Value Date   VD25OH 39.3 09/29/2022   VD25OH 30.0 04/29/2022   Lab Results  Component Value Date   WBC 7.4 04/29/2022   HGB 13.3 04/29/2022   HCT 40.2 04/29/2022   MCV 78 (L) 04/29/2022   PLT 203 04/29/2022   Lab Results  Component Value Date   IRON 43 04/29/2022   TIBC 315 04/29/2022   FERRITIN 374 (H) 04/29/2022   Attestation Statements:   Reviewed by clinician on day of visit: allergies, medications, problem list, medical history, surgical history, family history, social history, and previous encounter notes.  I have reviewed the above documentation for accuracy and completeness, and I agree with the above. -  Tiari Andringa d. Tyger Wichman, NP-C

## 2022-11-25 ENCOUNTER — Encounter: Payer: Self-pay | Admitting: Internal Medicine

## 2022-11-27 NOTE — Telephone Encounter (Signed)
FYI,  Patient confirmed appt date and time.

## 2022-12-02 ENCOUNTER — Encounter (INDEPENDENT_AMBULATORY_CARE_PROVIDER_SITE_OTHER): Payer: Self-pay | Admitting: Adult Health

## 2022-12-02 ENCOUNTER — Ambulatory Visit (INDEPENDENT_AMBULATORY_CARE_PROVIDER_SITE_OTHER): Payer: Medicare Other | Admitting: Adult Health

## 2022-12-02 VITALS — BP 140/78 | HR 61 | Temp 97.8°F | Ht 65.0 in | Wt 336.0 lb

## 2022-12-02 DIAGNOSIS — E88819 Insulin resistance, unspecified: Secondary | ICD-10-CM

## 2022-12-02 DIAGNOSIS — Z6841 Body Mass Index (BMI) 40.0 and over, adult: Secondary | ICD-10-CM

## 2022-12-02 DIAGNOSIS — E669 Obesity, unspecified: Secondary | ICD-10-CM

## 2022-12-02 DIAGNOSIS — E559 Vitamin D deficiency, unspecified: Secondary | ICD-10-CM

## 2022-12-02 DIAGNOSIS — I1A0 Resistant hypertension: Secondary | ICD-10-CM

## 2022-12-02 MED ORDER — VITAMIN D (ERGOCALCIFEROL) 1.25 MG (50000 UNIT) PO CAPS: 50000.0000 [IU] | ORAL_CAPSULE | ORAL | 0 refills | Status: DC

## 2022-12-02 NOTE — Progress Notes (Signed)
WEIGHT SUMMARY AND BIOMETRICS  Vitals Temp: 97.8 F (36.6 C) BP: (!) 140/78 Pulse Rate: 61 SpO2: 100 %   Anthropometric Measurements Height: 5\' 5"  (1.651 m) Weight: (!) 336 lb (152.4 kg) BMI (Calculated): 55.91 Weight at Last Visit: 336lb Weight Lost Since Last Visit: 0 Weight Gained Since Last Visit: 0 Starting Weight: 359lb Total Weight Loss (lbs): 23 lb (10.4 kg)   Body Composition  Body Fat %: 61.6 % Fat Mass (lbs): 207.4 lbs Muscle Mass (lbs): 122.8 lbs Visceral Fat Rating : 27   Other Clinical Data Fasting: No Labs: No Today's Visit #: 9 Starting Date: 04/29/22    Chief Complaint:   OBESITY Julia Ware is here to discuss her progress with her obesity treatment plan. She is on the the Category 3 Plan and states she is following her eating plan approximately 25 % of the time. She states she is exercising Chair Exercise 10 minutes 3 times per week.   Interim History:  Julia Ware re-started Mounjaro 5mg  on/about 11/05/22 Denies mass in neck, dysphagia, dyspepsia, persistent hoarseness, abdominal pain, or N/V/C   PCP OV/Kim Cyndie Chime NP-C on 11/21/2022- Labs completed, increased Amlodipine from 5mg  to 10mg  QD TSH suppressed: 0.24, she endorses palpitations, denies CP or hot/cold intolarances Ms. Vandamme is following up with her established Endocrinologist/Dr. Lonzo Cloud  Subjective:   1. Insulin resistance  Latest Reference Range & Units 04/29/22 10:05 09/29/22 09:33  INSULIN 2.6 - 24.9 uIU/mL 13.9 14.8  Re-started Mounjaro 5mg  on/about 11/05/22 Denies mass in neck, dysphagia, dyspepsia, persistent hoarseness, abdominal pain, or N/V. She endorses constipation. She denies hematochezia  2. Vitamin D deficiency  Latest Reference Range & Units 04/29/22 10:05 09/29/22 09:33  Vitamin D, 25-Hydroxy 30.0 - 100.0 ng/mL 30.0 39.3   She is on weekly Ergocalciferol- denies N/V/Muscle Weakness  3. Resistant HTN 11/21/2022 PCP increased Amlopidine from 5mg  to 10mg  HW[facility]  has previously managed Lisinopril/hydrochlorothiazide 20/25mg  every day and Carvedilol 6.25mg  BID She endorses recent palpitations, denies CP or dyspnea  Assessment/Plan:   1. Insulin resistance Continue weekly Mounjaro therapy per PCP  2. Vitamin D deficiency refill - Vitamin D, Ergocalciferol, (DRISDOL) 1.25 MG (50000 UNIT) CAPS capsule; Take 1 capsule (50,000 Units total) by mouth every 7 (seven) days.  Dispense: 4 capsule; Refill: 0  3. Resistant HTN Moving forward request ALL ANTIHYPERTENSIVE refills from PCP Pt verbalized understanding/agreement  4. Obesity with current BMI of 55.91  Julia Ware is currently in the action stage of change. As such, her goal is to continue with weight loss efforts. She has agreed to the Category 3 Plan.   Exercise goals: Older adults should follow the adult guidelines. When older adults cannot meet the adult guidelines, they should be as physically active as their abilities and conditions will allow.  Older adults with chronic conditions should understand whether and how their conditions affect their ability to do regular physical activity safely.  Behavioral modification strategies: increasing lean protein intake, decreasing simple carbohydrates, increasing vegetables, increasing water intake, no skipping meals, meal planning and cooking strategies, and planning for success.  Iselis has agreed to follow-up with our clinic in 4 weeks. She was informed of the importance of frequent follow-up visits to maximize her success with intensive lifestyle modifications for her multiple health conditions.   Objective:   Blood pressure (!) 140/78, pulse 61, temperature 97.8 F (36.6 C), height 5\' 5"  (1.651 m), weight (!) 336 lb (152.4 kg), SpO2 100 %. Body mass index is 55.91 kg/m.  General: Cooperative,  alert, well developed, in no acute distress. HEENT: Conjunctivae and lids unremarkable. Cardiovascular: Regular rhythm.  Lungs: Normal work of  breathing. Neurologic: No focal deficits.   Lab Results  Component Value Date   CREATININE 0.97 04/29/2022   BUN 13 04/29/2022   NA 139 04/29/2022   K 4.0 04/29/2022   CL 100 04/29/2022   CO2 26 04/29/2022   Lab Results  Component Value Date   ALT 13 04/29/2022   AST 13 04/29/2022   ALKPHOS 65 04/29/2022   BILITOT 0.5 04/29/2022   Lab Results  Component Value Date   HGBA1C 5.4 09/29/2022   HGBA1C 5.4 04/29/2022   Lab Results  Component Value Date   INSULIN 14.8 09/29/2022   INSULIN 13.9 04/29/2022   Lab Results  Component Value Date   TSH 0.40 03/03/2022   Lab Results  Component Value Date   CHOL 178 04/29/2022   HDL 60 04/29/2022   LDLCALC 100 (H) 04/29/2022   TRIG 97 04/29/2022   Lab Results  Component Value Date   VD25OH 39.3 09/29/2022   VD25OH 30.0 04/29/2022   Lab Results  Component Value Date   WBC 7.4 04/29/2022   HGB 13.3 04/29/2022   HCT 40.2 04/29/2022   MCV 78 (L) 04/29/2022   PLT 203 04/29/2022   Lab Results  Component Value Date   IRON 43 04/29/2022   TIBC 315 04/29/2022   FERRITIN 374 (H) 04/29/2022   Attestation Statements:   Reviewed by clinician on day of visit: allergies, medications, problem list, medical history, surgical history, family history, social history, and previous encounter notes.  I have reviewed the above documentation for accuracy and completeness, and I agree with the above. -  Rekita Miotke d. Daeton Kluth, NP-C

## 2022-12-24 ENCOUNTER — Encounter: Payer: Self-pay | Admitting: Internal Medicine

## 2022-12-24 ENCOUNTER — Ambulatory Visit: Payer: Medicare Other | Admitting: Internal Medicine

## 2022-12-24 VITALS — BP 134/86 | HR 62 | Ht 65.0 in | Wt 341.0 lb

## 2022-12-24 DIAGNOSIS — E0590 Thyrotoxicosis, unspecified without thyrotoxic crisis or storm: Secondary | ICD-10-CM | POA: Diagnosis not present

## 2022-12-24 DIAGNOSIS — E042 Nontoxic multinodular goiter: Secondary | ICD-10-CM

## 2022-12-24 LAB — T4, FREE: Free T4: 1.16 ng/dL (ref 0.60–1.60)

## 2022-12-24 LAB — TSH: TSH: 0.2 u[IU]/mL — ABNORMAL LOW (ref 0.35–5.50)

## 2022-12-24 NOTE — Patient Instructions (Signed)
Labs at  [facility] office  located at [address]. [place]

## 2022-12-24 NOTE — Progress Notes (Unsigned)
Name: Julia Ware  MRN/ DOB: [id number], [birth year]    Age/ Sex: 67 y.o., female     PCP: Loura Back, NP   Reason for Endocrinology Evaluation: MNG     Initial Endocrinology Clinic Visit: 10/30/2020    PATIENT IDENTIFIER: Julia Ware is a 67 y.o., female with a past medical history of HTN and MNG. She has followed with [facility] since 10/30/2020 for consultative assistance with management of her MNG.   HISTORICAL SUMMARY:   She has an incidental finding of thyroid nodule in CT scan in 2009. This prompted thyroid ultrasound confirming MNG with hx of benign FNA in 2010 ( right inferior ) and 2012 ( right superior ) nodules    She also had a thyroid uptake and scan in 2009 with hyperthyroidism with normal uptake at 16.8%.    MNG stability confirmed over a 5 year period and no serial ultrasound was recommended   No FH of thyroid disease   SUBJECTIVE:    Today (12/24/2022):  Julia Ware is here for a follow up on MNG and subclinical hyperthyroidism.   Weight has been trending up  Denies local neck swelling  Started with palpitations since being on Mounjaro  She does have constipation  Denies tremors  No Biotin   She was treated for Hep C through [facility] in the past     HISTORY:  Past Medical History:  Past Medical History:  Diagnosis Date   Back pain    Bilateral swelling of feet    Constipation    Hepatitis C    High blood pressure    Hypertension    Hyperthyroidism    Joint pain    Prediabetes    Thyroid nodule    Vitamin D deficiency    Past Surgical History:  Past Surgical History:  Procedure Laterality Date   COLONOSCOPY WITH PROPOFOL N/A 12/28/2020   Procedure: COLONOSCOPY WITH PROPOFOL;  Surgeon: Jeani Hawking, MD;  Location: [facility];  Service: Endoscopy;  Laterality: N/A;   POLYPECTOMY  12/28/2020   Procedure: POLYPECTOMY;  Surgeon: Jeani Hawking, MD;  Location: [facility];  Service: Endoscopy;;   Social  History:  reports that she has never smoked. She has never used smokeless tobacco. She reports current alcohol use. She reports that she does not use drugs. Family History:  Family History  Problem Relation Age of Onset   Cancer Mother    Diabetes Mother    Hypertension Mother    Obesity Mother    Kidney disease Father    Hypertension Father    Hyperlipidemia Father      HOME MEDICATIONS: Allergies as of 12/24/2022   No Known Allergies      Medication List        Accurate as of December 24, 2022  9:59 AM. If you have any questions, ask your nurse or doctor.          acetaminophen 500 MG tablet Commonly known as: TYLENOL Take 1,000 mg by mouth every 6 (six) hours as needed (pain).   amLODipine 5 MG tablet Commonly known as: NORVASC Take 1 tablet (5 mg total) by mouth daily. What changed: when to take this   carvedilol 6.25 MG tablet Commonly known as: COREG Take 6.25 mg by mouth 2 (two) times daily.   Comfort Touch BP Cuff/Large Misc 1 Units by Does not apply route daily.   ibuprofen 800 MG tablet Commonly known as: ADVIL Take 800 mg by mouth 3 (three)  times daily.   lisinopril-hydrochlorothiazide 20-25 MG tablet Commonly known as: ZESTORETIC Take 2 tablets by mouth daily.   Mounjaro 5 MG/0.5ML Pen Generic drug: tirzepatide Inject 5 mg into the skin once a week. What changed: Another medication with the same name was removed. Continue taking this medication, and follow the directions you see here. Changed by: Scarlette Shorts, MD   Vitamin D (Ergocalciferol) 1.25 MG (50000 UNIT) Caps capsule Commonly known as: DRISDOL Take 1 capsule (50,000 Units total) by mouth every 7 (seven) days.          OBJECTIVE:   PHYSICAL EXAM: VS: BP 134/86 (BP Location: Left Arm, Patient Position: Sitting, Cuff Size: Large)   Pulse 62   Ht 5\' 5"  (1.651 m)   Wt (!) 341 lb (154.7 kg)   SpO2 99%   BMI 56.75 kg/m    EXAM: General: Pt appears well and is in NAD   Neck: General: Supple without adenopathy. Thyroid: Right nodule appreciated.   Lungs: Clear with good BS bilat   Heart: Auscultation: RRR.  Extremities:  BL JW:[id number] pretibial edema   Mental Status: Judgment, insight: Intact Orientation: Oriented to time, place, and person Mood and affect: No depression, anxiety, or agitation     DATA REVIEWED:   Latest Reference Range & Units 12/24/22 10:50  TSH 0.35 - 5.50 uIU/mL 0.20 (L)  Triiodothyronine (T3) 76 - 181 ng/dL 782  N5,AOZH(YQMVHQ) 4.69 - 1.60 ng/dL 6.29  (L): Data is abnormally low   04/18/2021 TSH 0.21 uIU/mL FT4 1.4 ng/dL   FNA right superior 3.1 cm 04/23/2011: Benign        FNA right lower pole nodule 4 cm 2/9/20210: benign     Thyroid Ultrasound 08/20/2020  The previously biopsied approximately 3.7 x 3.0 x 2.2 cm spongiform/benign-appearing mass involving the superior aspect the right lobe of the thyroid (labeled 1), is unchanged compared to the 04/23/2011 examination, previously, 4.6 x 3.0 x 2.2 cm. Correlation with previous biopsy results is advised.   Previously noted 4.6 cm isoechoic ill-defined nodule involving the mid, posterior aspect the right lobe of the thyroid is not definitely seen on the present examination and thus may have represented a pseudonodule in the setting of multinodular goiter.   The approximately 2.2 x 2.1 x 1.5 cm isoechoic nodule within the inferior medial aspect of the right lobe of the thyroid (labeled 3), is unchanged compared to the 08/2013 examination, previously, 2.4 x 2.1 x 1.3 cm. Stability for greater than 5 years is indicative benign etiology.   _________________________________________________________   The approximately 1.3 cm partially cystic though predominantly solid nodule within the inferior pole of the left lobe of the thyroid (labeled 4) is grossly unchanged compared to the 2015 examination, previously, 1.9 cm. Imaging stability for greater than 5 years  is indicative of a benign etiology.   The approximately 2.0 cm spongiform/benign-appearing nodule within mid aspect the left lobe of the thyroid (labeled 5) is grossly unchanged compared to the 2015 examination, previously, 2.1 cm. Imaging stability for greater than 5 years is indicative of benign etiology.   IMPRESSION: 1. 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 No worrisome new or enlarging thyroid nodules. 2. Previously biopsied dominant spongiform/benign-appearing right-sided thyroid nodule/mass is grossly unchanged compared to the 20/12 examination. Correlation with previous biopsy results is advised. Additionally, imaging stability for greater than 5 years is indicative of a benign etiology. 3. The remaining thyroid nodules are grossly unchanged since at least the 2015 examination. Imaging stability for greater  than 5 years is indicative of a benign etiology.        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 MNG:   - No local neck symptoms  - Reassurance has been provided as these nodules have not increased in size in over 5 yrs.  - No serial thyroid ultrasound are  indicated unless there's a clinical concern about change is size    F/U in 6 months  I spent 25 minutes preparing to see the patient by review of recent labs, imaging and procedures, obtaining and reviewing separately obtained history, communicating with the patient, ordering medications, tests or procedures, and documenting clinical information in the EHR including the differential Dx, treatment, and any further evaluation and other management    Signed electronically by: Lyndle Herrlich, MD  [facility]  [facility] [address] [place], [place] [address] Phone: [phone] FAX: [phone]      CC: Loura Back, NP [address] [place] [address] Phone: [phone]  Fax: [phone]   Return to Endocrinology clinic as below: Future Appointments  Date Time Provider Department Center  12/24/2022 10:10 AM Catrice Zuleta, Konrad Dolores, MD [facility] None  01/05/2023  7:30 AM Julaine Fusi, NP [facility] None  03/04/2023  9:10 AM Denica Web, Konrad Dolores, MD [facility] None

## 2022-12-25 LAB — T3: T3, Total: 116 ng/dL (ref 76–181)

## 2023-01-05 ENCOUNTER — Ambulatory Visit (INDEPENDENT_AMBULATORY_CARE_PROVIDER_SITE_OTHER): Payer: Medicare Other | Admitting: Adult Health

## 2023-01-05 ENCOUNTER — Encounter (INDEPENDENT_AMBULATORY_CARE_PROVIDER_SITE_OTHER): Payer: Self-pay | Admitting: Adult Health

## 2023-01-05 VITALS — BP 134/76 | HR 68 | Temp 98.3°F | Ht 65.0 in | Wt 337.0 lb

## 2023-01-05 DIAGNOSIS — E669 Obesity, unspecified: Secondary | ICD-10-CM

## 2023-01-05 DIAGNOSIS — R002 Palpitations: Secondary | ICD-10-CM | POA: Diagnosis not present

## 2023-01-05 DIAGNOSIS — Z6841 Body Mass Index (BMI) 40.0 and over, adult: Secondary | ICD-10-CM

## 2023-01-05 DIAGNOSIS — I1A0 Resistant hypertension: Secondary | ICD-10-CM

## 2023-01-05 NOTE — Progress Notes (Signed)
WEIGHT SUMMARY AND BIOMETRICS  Vitals Temp: 98.3 F (36.8 C) BP: 134/76 Pulse Rate: 68 SpO2: 100 %   Anthropometric Measurements Height: 5\' 5"  (1.651 m) Weight: (!) 337 lb (152.9 kg) BMI (Calculated): 56.08 Weight at Last Visit: 336 lb Weight Lost Since Last Visit: 1 lb Weight Gained Since Last Visit: 0 Starting Weight: 359 lb Total Weight Loss (lbs): 22 lb (9.979 kg) Peak Weight: 360 lb   Body Composition  Body Fat %: 60.5 % Fat Mass (lbs): 204 lbs Muscle Mass (lbs): 126.4 lbs Visceral Fat Rating : 27   Other Clinical Data Fasting: yes Labs: no Today's Visit #: 10 Starting Date: 04/29/22    Chief Complaint:   OBESITY Julia Ware is here to discuss her progress with her obesity treatment plan. She is on the the Category 3 Plan and states she is following her eating plan approximately 20 % of the time. She states she is exercising Seated Chair Exercise 10 minutes 3 times per week.   Interim History:  Julia Ware re-started Greggory Keen 5mg  on/about 11/05/22 - Managed by PCP/Kim Nguygen NP-C Julia Ware reports "falling into [facility]" of Medicare D Rx coverage. Greggory Keen is quite costly. She is currently on weekly Mounjaro 5mg  injection Denies mass in neck, dysphagia, dyspepsia, persistent hoarseness, abdominal pain, or N/V/C  She reports intermittent, nightly palpitations. She reports limited plain water intake  She was recently seen by Endo/Dr. Lonzo Cloud on7/03/2023, re: MNG  Subjective:   1. Palpitations Julia Ware reports palpitations in late evening- will last 3-7 mins. She estimates palpitations will occur 3 out 7 nights per week. She denies CP or dyspnea with palpitations. She estimates that palpitations began early June 2023 She denies tobacco or vape use  2. Resistant hypertension BP MUCH improved at OV today. She denies CP. She endorses recent, intermittent palpitations. She is currently on: carvedilol (COREG) 6.25 MG tablet  amLODipine  (NORVASC) 5 MG tablet  lisinopril-hydrochlorothiazide (ZESTORETIC) 20-25 MG tablet    Assessment/Plan:   1. Palpitations INCREASE WATER Limit caffiene  2. Resistant hypertension Continue carvedilol (COREG) 6.25 MG tablet  amLODipine (NORVASC) 5 MG tablet  lisinopril-hydrochlorothiazide (ZESTORETIC) 20-25 MG tablet    3. Obesity with current BMI of 56.1  Julia Ware is currently in the action stage of change. As such, her goal is to continue with weight loss efforts. She has agreed to the Category 3 Plan.   Exercise goals: Older adults should follow the adult guidelines. When older adults cannot meet the adult guidelines, they should be as physically active as their abilities and conditions will allow.  Older adults should do exercises that maintain or improve balance if they are at risk of falling.  Older adults with chronic conditions should understand whether and how their conditions affect their ability to do regular physical activity safely.  Behavioral modification strategies: increasing lean protein intake, decreasing simple carbohydrates, increasing vegetables, increasing water intake, no skipping meals, meal planning and cooking strategies, and planning for success.  Julia Ware has agreed to follow-up with our clinic in 4 weeks. She was informed of the importance of frequent follow-up visits to maximize her success with intensive lifestyle modifications for her multiple health conditions.   Objective:   Blood pressure 134/76, pulse 68, temperature 98.3 F (36.8 C), height 5\' 5"  (1.651 m), weight (!) 337 lb (152.9 kg), SpO2 100%. Body mass index is 56.08 kg/m.  General: Cooperative, alert, well developed, in no acute distress. HEENT: Conjunctivae and lids unremarkable. Cardiovascular: Regular rhythm.  Lungs:  Normal work of breathing. Neurologic: No focal deficits.   Lab Results  Component Value Date   CREATININE 0.97 04/29/2022   BUN 13 04/29/2022   NA 139 04/29/2022   K 4.0  04/29/2022   CL 100 04/29/2022   CO2 26 04/29/2022   Lab Results  Component Value Date   ALT 13 04/29/2022   AST 13 04/29/2022   ALKPHOS 65 04/29/2022   BILITOT 0.5 04/29/2022   Lab Results  Component Value Date   HGBA1C 5.4 09/29/2022   HGBA1C 5.4 04/29/2022   Lab Results  Component Value Date   INSULIN 14.8 09/29/2022   INSULIN 13.9 04/29/2022   Lab Results  Component Value Date   TSH 0.20 (L) 12/24/2022   Lab Results  Component Value Date   CHOL 178 04/29/2022   HDL 60 04/29/2022   LDLCALC 100 (H) 04/29/2022   TRIG 97 04/29/2022   Lab Results  Component Value Date   VD25OH 39.3 09/29/2022   VD25OH 30.0 04/29/2022   Lab Results  Component Value Date   WBC 7.4 04/29/2022   HGB 13.3 04/29/2022   HCT 40.2 04/29/2022   MCV 78 (L) 04/29/2022   PLT 203 04/29/2022   Lab Results  Component Value Date   IRON 43 04/29/2022   TIBC 315 04/29/2022   FERRITIN 374 (H) 04/29/2022    Attestation Statements:   Reviewed by clinician on day of visit: allergies, medications, problem list, medical history, surgical history, family history, social history, and previous encounter notes.  Time spent on visit including pre-visit chart review and post-visit care and charting was 28 minutes.   I have reviewed the above documentation for accuracy and completeness, and I agree with the above. -  Hebe Merriwether d. Julia Wooding, NP-C

## 2023-01-06 ENCOUNTER — Encounter (HOSPITAL_COMMUNITY)
Admission: RE | Admit: 2023-01-06 | Discharge: 2023-01-06 | Disposition: A | Discharge status: Home / Self Care | Payer: Medicare Other | Source: Ambulatory Visit | Attending: Internal Medicine | Admitting: Internal Medicine

## 2023-01-06 DIAGNOSIS — E0590 Thyrotoxicosis, unspecified without thyrotoxic crisis or storm: Secondary | ICD-10-CM | POA: Insufficient documentation

## 2023-01-06 MED ORDER — SODIUM IODIDE I-123 7.4 MBQ CAPS
330.0000 | ORAL_CAPSULE | Freq: Once | ORAL | Status: AC
  Administered 2023-01-06: 330 via ORAL

## 2023-01-07 ENCOUNTER — Encounter (HOSPITAL_COMMUNITY)
Admission: RE | Admit: 2023-01-07 | Discharge: 2023-01-07 | Disposition: A | Discharge status: Home / Self Care | Payer: Medicare Other | Source: Ambulatory Visit | Attending: Internal Medicine | Admitting: Internal Medicine

## 2023-02-05 ENCOUNTER — Other Ambulatory Visit (INDEPENDENT_AMBULATORY_CARE_PROVIDER_SITE_OTHER): Payer: Self-pay | Admitting: Adult Health

## 2023-02-05 DIAGNOSIS — I1A0 Resistant hypertension: Secondary | ICD-10-CM

## 2023-02-10 ENCOUNTER — Encounter (INDEPENDENT_AMBULATORY_CARE_PROVIDER_SITE_OTHER): Payer: Self-pay | Admitting: Adult Health

## 2023-02-10 ENCOUNTER — Ambulatory Visit (INDEPENDENT_AMBULATORY_CARE_PROVIDER_SITE_OTHER): Payer: Medicare Other | Admitting: Adult Health

## 2023-02-10 VITALS — BP 137/83 | HR 65 | Temp 97.7°F | Ht 65.0 in | Wt 340.0 lb

## 2023-02-10 DIAGNOSIS — E042 Nontoxic multinodular goiter: Secondary | ICD-10-CM

## 2023-02-10 DIAGNOSIS — I1A0 Resistant hypertension: Secondary | ICD-10-CM

## 2023-02-10 DIAGNOSIS — E669 Obesity, unspecified: Secondary | ICD-10-CM

## 2023-02-10 DIAGNOSIS — Z6841 Body Mass Index (BMI) 40.0 and over, adult: Secondary | ICD-10-CM

## 2023-02-10 DIAGNOSIS — E559 Vitamin D deficiency, unspecified: Secondary | ICD-10-CM | POA: Diagnosis not present

## 2023-02-10 MED ORDER — VITAMIN D (ERGOCALCIFEROL) 1.25 MG (50000 UNIT) PO CAPS
50000.0000 [IU] | ORAL_CAPSULE | ORAL | 0 refills | Status: DC
Start: 2023-02-10 — End: 2023-03-26

## 2023-02-10 NOTE — Progress Notes (Signed)
WEIGHT SUMMARY AND BIOMETRICS  Vitals Temp: 97.7 F (36.5 C) BP: 137/83 Pulse Rate: 65 SpO2: 100 %   Anthropometric Measurements Height: 5\' 5"  (1.651 m) Weight: (!) 340 lb (154.2 kg) BMI (Calculated): 56.58 Weight at Last Visit: 337lb Weight Lost Since Last Visit: 0 Weight Gained Since Last Visit: 3lb Starting Weight: 359lb Total Weight Loss (lbs): 19 lb (8.618 kg) Peak Weight: 360lb   Body Composition  Body Fat %: 62.8 % Fat Mass (lbs): 213.6 lbs Muscle Mass (lbs): 120.2 lbs Visceral Fat Rating : 28   Other Clinical Data Fasting: yes Labs: no Today's Visit #: 11 Starting Date: 04/29/22    Chief Complaint:   OBESITY Julia Ware is here to discuss her progress with her obesity treatment plan. She is on the the Category 3 Plan and states she is following her eating plan approximately 20 % of the time. She states she is not currently exercising - worsening R Knee pain.   Interim History:  [name] endorses worsening R knee pain. Despite this pain, she has not been using her Handicap Parking Sticker- to encourage her to increase daily steps.  She estimates to drink 3 x 16.9 oz water bottles/day  Her PCP/KIM Nguygen NP-C, started her on Mounjaro therapy- she had titrated up to 5mg  once weekly injection. She stopped therapy 2 weeks ago due to cost.  Subjective:   1. Multinodular goiter Endo OV Notes 12/24/2022 PATIENT IDENTIFIER: Julia Ware is a 67 y.o., female with a past medical history of HTN and MNG. She has followed with [facility] since 10/30/2020 for consultative assistance with management of her MNG.    HISTORICAL SUMMARY:  She has an incidental finding of thyroid nodule in CT scan in 2009. This prompted thyroid ultrasound confirming MNG with hx of benign FNA in 2010 ( right inferior ) and 2012 ( right superior ) nodules  She also had a thyroid uptake and scan in 2009 with hyperthyroidism with normal uptake at 16.8%.  MNG  stability confirmed over a 5 year period and no serial ultrasound was recommended No FH of thyroid disease   Thyroid Ultrasound 08/20/2020  IMPRESSION: 1. 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 No worrisome new or enlarging thyroid nodules. 2. Previously biopsied dominant spongiform/benign-appearing right-sided thyroid nodule/mass is grossly unchanged compared to the 20/12 examination. Correlation with previous biopsy results is advised. Additionally, imaging stability for greater than 5 years is indicative of a benign etiology. 3. The remaining thyroid nodules are grossly unchanged since at least the 2015 examination. Imaging stability for greater than 5 years is indicative of a benign etiology.  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 MNG:    - No local neck symptoms  - Reassurance has been  provided as these nodules have not increased in size in over 5 yrs.  - No serial thyroid ultrasound are indicated unless there's a clinical concern about change is size F/U in 6 months   2. Resistant hypertension BP much improved at OV today She denies any recent palpitations She denies CP/dyspnea with exertion  3. Vitamin D  deficiency  Latest Reference Range & Units 04/29/22 10:05 09/29/22 09:33  Vitamin D, 25-Hydroxy 30.0 - 100.0 ng/mL 30.0 39.3   She is on weekly Ergocalciferol- denies N/V/Muscle Weakness  Assessment/Plan:   1. Multinodular goiter F?u with Endocrinology as directed.  2. Resistant hypertension Continue to reduce Na+ intake and start Chair Exercise- strive for at least 2 x week  3. Vitamin D deficiency Refill - Vitamin D, Ergocalciferol, (DRISDOL) 1.25 MG (50000 UNIT) CAPS capsule; Take 1 capsule (50,000 Units total) by mouth every 7 (seven) days.  Dispense: 4 capsule; Refill: 0  4. Obesity with current BMI of 56.58  [name] is currently in the action stage of change. As such, her goal is to continue with weight loss efforts. She has agreed to the Category 3 Plan.   Exercise goals: Older adults should follow the adult guidelines. When older adults cannot meet the adult guidelines, they should be as physically active as their abilities and conditions will allow.  Older adults should do exercises that maintain or improve balance if they are at risk of falling.  Older adults should determine their level of effort for physical activity relative to their level of fitness.  Older adults with chronic conditions should understand whether and how their conditions affect their ability to do regular physical activity safely.  Behavioral modification strategies: increasing lean protein intake, decreasing simple carbohydrates, increasing vegetables, increasing water intake, no skipping meals, meal planning and cooking strategies, better snacking choices, and planning for success.  [name] has agreed to follow-up with our clinic in 4 weeks. She was informed of the importance of frequent follow-up visits to maximize her success with intensive lifestyle modifications for her multiple health conditions.    Objective:   Blood pressure 137/83, pulse 65, temperature 97.7 F (36.5 C), height 5\' 5"  (1.651 m),  weight (!) 340 lb (154.2 kg), SpO2 100%. Body mass index is 56.58 kg/m.  General: Cooperative, alert, well developed, in no acute distress. HEENT: Conjunctivae and lids unremarkable. Cardiovascular: Regular rhythm.  Lungs: Normal work of breathing. Neurologic: No focal deficits.   Lab Results  Component Value Date   CREATININE 0.97 04/29/2022   BUN 13 04/29/2022   NA 139 04/29/2022   K 4.0 04/29/2022   CL 100 04/29/2022   CO2 26 04/29/2022   Lab Results  Component Value Date   ALT 13 04/29/2022   AST 13 04/29/2022   ALKPHOS 65 04/29/2022   BILITOT 0.5 04/29/2022   Lab Results  Component Value Date   HGBA1C 5.4 09/29/2022   HGBA1C 5.4 04/29/2022   Lab Results  Component Value Date   INSULIN 14.8 09/29/2022   INSULIN 13.9 04/29/2022   Lab Results  Component Value Date   TSH 0.20 (L) 12/24/2022   Lab Results  Component Value Date   CHOL 178 04/29/2022   HDL 60 04/29/2022   LDLCALC 100 (H) 04/29/2022   TRIG 97 04/29/2022   Lab Results  Component Value Date   VD25OH 39.3 09/29/2022   VD25OH 30.0 04/29/2022   Lab Results  Component Value Date   WBC 7.4 04/29/2022   HGB 13.3 04/29/2022   HCT 40.2  04/29/2022   MCV 78 (L) 04/29/2022   PLT 203 04/29/2022   Lab Results  Component Value Date   IRON 43 04/29/2022   TIBC 315 04/29/2022   FERRITIN 374 (H) 04/29/2022    Attestation Statements:   Reviewed by clinician on day of visit: allergies, medications, problem list, medical history, surgical history, family history, social history, and previous encounter notes.  I have reviewed the above documentation for accuracy and completeness, and I agree with the above. -  Alexarae Oliva d. Saanvi Hakala, NP-C

## 2023-02-24 ENCOUNTER — Encounter (INDEPENDENT_AMBULATORY_CARE_PROVIDER_SITE_OTHER): Payer: Self-pay

## 2023-03-04 ENCOUNTER — Ambulatory Visit: Payer: Medicare Other | Admitting: Internal Medicine

## 2023-03-12 ENCOUNTER — Ambulatory Visit (INDEPENDENT_AMBULATORY_CARE_PROVIDER_SITE_OTHER): Payer: Medicare Other | Admitting: Adult Health

## 2023-03-26 ENCOUNTER — Encounter (INDEPENDENT_AMBULATORY_CARE_PROVIDER_SITE_OTHER): Payer: Self-pay | Admitting: Adult Health

## 2023-03-26 ENCOUNTER — Ambulatory Visit (INDEPENDENT_AMBULATORY_CARE_PROVIDER_SITE_OTHER): Payer: Medicare Other | Admitting: Adult Health

## 2023-03-26 VITALS — BP 137/82 | HR 58 | Temp 97.9°F | Ht 65.0 in | Wt 348.0 lb

## 2023-03-26 DIAGNOSIS — I1A0 Resistant hypertension: Secondary | ICD-10-CM | POA: Diagnosis not present

## 2023-03-26 DIAGNOSIS — Z6841 Body Mass Index (BMI) 40.0 and over, adult: Secondary | ICD-10-CM

## 2023-03-26 DIAGNOSIS — E669 Obesity, unspecified: Secondary | ICD-10-CM

## 2023-03-26 DIAGNOSIS — E559 Vitamin D deficiency, unspecified: Secondary | ICD-10-CM | POA: Diagnosis not present

## 2023-03-26 DIAGNOSIS — R632 Polyphagia: Secondary | ICD-10-CM | POA: Diagnosis not present

## 2023-03-26 DIAGNOSIS — E88819 Insulin resistance, unspecified: Secondary | ICD-10-CM | POA: Diagnosis not present

## 2023-03-26 MED ORDER — VITAMIN D (ERGOCALCIFEROL) 1.25 MG (50000 UNIT) PO CAPS
50000.0000 [IU] | ORAL_CAPSULE | ORAL | 0 refills | Status: AC
Start: 2023-03-26 — End: ?

## 2023-03-26 NOTE — Progress Notes (Signed)
WEIGHT SUMMARY AND BIOMETRICS  Vitals Temp: 97.9 F (36.6 C) BP: 137/82 Pulse Rate: (!) 58 SpO2: 98 %   Anthropometric Measurements Height: 5\' 5"  (1.651 m) Weight: (!) 348 lb (157.9 kg) BMI (Calculated): 57.91 Weight at Last Visit: 340 lb Weight Lost Since Last Visit: 0 Weight Gained Since Last Visit: 8 lb Starting Weight: 359 lb Total Weight Loss (lbs): 11 lb (4.99 kg) Peak Weight: 360 lb   Body Composition  Body Fat %: 65.9 % Fat Mass (lbs): 229.4 lbs Muscle Mass (lbs): 112.8 lbs Visceral Fat Rating : 30   Other Clinical Data Fasting: yes Labs: no Starting Date: 04/29/22    Chief Complaint:   OBESITY Tawnee is here to discuss her progress with her obesity treatment plan. She is on the the Category 3 Plan and states she is following her eating plan approximately 30 % of the time. She states she is exercising Chair Exercise 10 minutes 1 times per week.   Interim History: Last dose of Mounjaro therapy- early August 2024 She endorses increased polyphagia the last several weeks.  She reports weekly Chair Exercises for 10 mins once per week. She reports worsening bilateral knee pain, R worse than L  Needs BMI<40 to be eligible for knee replacement Current BMI 57.91  Subjective:   1. Resistant hypertension BP slightly above goal at OV She denies CP with exertion. PCP manages all antihypertensive therapy. She is currently on  carvedilol (COREG) 6.25 MG tablet  amLODipine (NORVASC) 5 MG tablet  lisinopril-hydrochlorothiazide (ZESTORETIC) 20-25 MG tablet   2. Vitamin D deficiency She reports improved energy levels since stopping Mounjaro therapy- early August 2024 She is on weekly Ergocalciferol- denies N/V/Muscle Weakness  3. Insulin resistance  Latest Reference Range & Units 04/29/22 10:05 09/29/22 09:33  INSULIN 2.6 - 24.9 uIU/mL 13.9 14.8   She is not currently on any antidiabetic medications. She endorses worsening polyphagia since mid  August 2024.  4. Polyphagia She reports late afternoon/early evening cravings and has been consuming: cakes, cookies, and chips. Last dose of Mounjaro therapy- early August 2024 She believes that her appetite has been steadily increasing since then. Zepbound or Reginal Lutes are both cost prohibitive  Assessment/Plan:   1. Resistant hypertension Check Labs - Comprehensive metabolic panel  2. Vitamin D deficiency Check Labs Refill - Vitamin D, Ergocalciferol, (DRISDOL) 1.25 MG (50000 UNIT) CAPS capsule; Take 1 capsule (50,000 Units total) by mouth every 7 (seven) days.  Dispense: 4 capsule; Refill: 0 - VITAMIN D 25 Hydroxy (Vit-D Deficiency, Fractures)  3. Insulin resistance Check Labs - Hemoglobin A1c - Insulin, random  4. Polyphagia Start Low CHO eating plans  5. Obesity with current BMI of 57.9  Lindell is currently in the action stage of change. As such, her goal is to continue with weight loss efforts. She has agreed to following a lower carbohydrate, vegetable and lean protein rich diet plan.   Exercise goals: Older adults should follow the adult guidelines. When older adults cannot meet the adult guidelines, they should be as physically active as their abilities and conditions will allow.  Older adults should do exercises that maintain or improve balance if they are at risk of falling.  Older adults should determine their level of effort for physical activity relative to their level of fitness.  Older adults with chronic conditions should understand whether and how their conditions affect their ability to do regular physical activity safely.  Behavioral modification strategies: increasing lean protein intake, decreasing simple carbohydrates,  increasing vegetables, increasing water intake, no skipping meals, meal planning and cooking strategies, and planning for success.  Chyla has agreed to follow-up with our clinic in 4 weeks. She was informed of the importance of frequent follow-up  visits to maximize her success with intensive lifestyle modifications for her multiple health conditions.   Deborha was informed we would discuss her lab results at her next visit unless there is a critical issue that needs to be addressed sooner. Minnie agreed to keep her next visit at the agreed upon time to discuss these results.  Objective:   Blood pressure 137/82, pulse (!) 58, temperature 97.9 F (36.6 C), height 5\' 5"  (1.651 m), weight (!) 348 lb (157.9 kg), SpO2 98%. Body mass index is 57.91 kg/m.  General: Cooperative, alert, well developed, in no acute distress. HEENT: Conjunctivae and lids unremarkable. Cardiovascular: Regular rhythm.  Lungs: Normal work of breathing. Neurologic: No focal deficits.   Lab Results  Component Value Date   CREATININE 0.97 04/29/2022   BUN 13 04/29/2022   NA 139 04/29/2022   K 4.0 04/29/2022   CL 100 04/29/2022   CO2 26 04/29/2022   Lab Results  Component Value Date   ALT 13 04/29/2022   AST 13 04/29/2022   ALKPHOS 65 04/29/2022   BILITOT 0.5 04/29/2022   Lab Results  Component Value Date   HGBA1C 5.4 09/29/2022   HGBA1C 5.4 04/29/2022   Lab Results  Component Value Date   INSULIN 14.8 09/29/2022   INSULIN 13.9 04/29/2022   Lab Results  Component Value Date   TSH 0.20 (L) 12/24/2022   Lab Results  Component Value Date   CHOL 178 04/29/2022   HDL 60 04/29/2022   LDLCALC 100 (H) 04/29/2022   TRIG 97 04/29/2022   Lab Results  Component Value Date   VD25OH 39.3 09/29/2022   VD25OH 30.0 04/29/2022   Lab Results  Component Value Date   WBC 7.4 04/29/2022   HGB 13.3 04/29/2022   HCT 40.2 04/29/2022   MCV 78 (L) 04/29/2022   PLT 203 04/29/2022   Lab Results  Component Value Date   IRON 43 04/29/2022   TIBC 315 04/29/2022   FERRITIN 374 (H) 04/29/2022    Attestation Statements:   Reviewed by clinician on day of visit: allergies, medications, problem list, medical history, surgical history, family history, social  history, and previous encounter notes.  I have reviewed the above documentation for accuracy and completeness, and I agree with the above. -  Zyshawn Bohnenkamp d. Veleka Djordjevic, NP-C

## 2023-03-27 LAB — COMPREHENSIVE METABOLIC PANEL
ALT: 10 [IU]/L (ref 0–32)
AST: 11 [IU]/L (ref 0–40)
Albumin: 4 g/dL (ref 3.9–4.9)
Alkaline Phosphatase: 58 [IU]/L (ref 44–121)
BUN/Creatinine Ratio: 17 (ref 12–28)
BUN: 18 mg/dL (ref 8–27)
Bilirubin Total: 0.4 mg/dL (ref 0.0–1.2)
CO2: 24 mmol/L (ref 20–29)
Calcium: 9.6 mg/dL (ref 8.7–10.3)
Chloride: 102 mmol/L (ref 96–106)
Creatinine, Ser: 1.07 mg/dL — ABNORMAL HIGH (ref 0.57–1.00)
Globulin, Total: 2.8 g/dL (ref 1.5–4.5)
Glucose: 97 mg/dL (ref 70–99)
Potassium: 4.2 mmol/L (ref 3.5–5.2)
Sodium: 141 mmol/L (ref 134–144)
Total Protein: 6.8 g/dL (ref 6.0–8.5)
eGFR: 57 mL/min/{1.73_m2} — ABNORMAL LOW (ref >59)

## 2023-03-27 LAB — INSULIN, RANDOM: INSULIN: 11.6 u[IU]/mL (ref 2.6–24.9)

## 2023-03-27 LAB — VITAMIN D 25 HYDROXY (VIT D DEFICIENCY, FRACTURES): Vit D, 25-Hydroxy: 41.5 ng/mL (ref 30.0–100.0)

## 2023-03-27 LAB — HEMOGLOBIN A1C
Est. average glucose Bld gHb Est-mCnc: 111 mg/dL
Hgb A1c MFr Bld: 5.5 % (ref 4.8–5.6)

## 2023-04-23 ENCOUNTER — Ambulatory Visit (INDEPENDENT_AMBULATORY_CARE_PROVIDER_SITE_OTHER): Payer: Medicare Other | Admitting: Adult Health

## 2023-06-26 ENCOUNTER — Encounter: Payer: Self-pay | Admitting: Internal Medicine

## 2023-06-26 ENCOUNTER — Ambulatory Visit: Payer: Medicare Other | Admitting: Internal Medicine

## 2023-06-26 VITALS — BP 128/72 | HR 78 | Ht 65.0 in | Wt 353.0 lb

## 2023-06-26 DIAGNOSIS — I1A0 Resistant hypertension: Secondary | ICD-10-CM

## 2023-06-26 DIAGNOSIS — E042 Nontoxic multinodular goiter: Secondary | ICD-10-CM

## 2023-06-26 DIAGNOSIS — E0590 Thyrotoxicosis, unspecified without thyrotoxic crisis or storm: Secondary | ICD-10-CM | POA: Diagnosis not present

## 2023-06-26 MED ORDER — DEXAMETHASONE 1 MG PO TABS: 1.0000 mg | ORAL_TABLET | Freq: Once | ORAL | 0 refills | Status: AC

## 2023-06-26 NOTE — Progress Notes (Signed)
 Name: Julia Ware  MRN/ DOB: [id number], [birth year]    Age/ Sex: 68 y.o., female     PCP: Leontine Cramp, NP   Reason for Endocrinology Evaluation: MNG     Initial Endocrinology Clinic Visit: 10/30/2020    PATIENT IDENTIFIER: Julia Ware is a 68 y.o., female with a past medical history of HTN and MNG. She has followed with [facility] since 10/30/2020 for consultative assistance with management of her MNG.   HISTORICAL SUMMARY:   She has an incidental finding of thyroid  nodule in CT scan in 2009. This prompted thyroid  ultrasound confirming MNG with hx of benign FNA in 2010 ( right inferior ) and 2012 ( right superior ) nodules    She also had a thyroid  uptake and scan in 2009 with hyperthyroidism with normal uptake at 16.8%.    MNG stability confirmed over a 5 year period and no serial ultrasound was recommended  Thyroid  uptake and scan 12/2022 showed photopenia at the previously biopsied right thyroid  nodule with slight increase 24-hour uptake at 31.4 %   No FH of thyroid  disease   SUBJECTIVE:    Today (06/26/2023):  Julia Ware is here for a follow up on MNG and subclinical hyperthyroidism.   She was on Mounjaro through the [facility] but was in the coverage gap and has been out of it which resulted in weight gain   Denies local neck swelling  Has occasional palpitations  Has occasional constipation  Denies tremors  No Biotin  No unusual headaches  Bedtime 11:30 pm   Patient also has a diagnosis of resistant hypertension, she is on lisinopril , HCTZ, carvedilol, and amlodipine    She was treated for Hep C through [facility] in the past     HISTORY:  Past Medical History:  Past Medical History:  Diagnosis Date   Back pain    Bilateral swelling of feet    Constipation    Hepatitis C    High blood pressure    Hypertension    Hyperthyroidism    Joint pain    Prediabetes    Thyroid  nodule    Vitamin D   deficiency    Past Surgical History:  Past Surgical History:  Procedure Laterality Date   COLONOSCOPY WITH PROPOFOL  N/A 12/28/2020   Procedure: COLONOSCOPY WITH PROPOFOL ;  Surgeon: Rollin Dover, MD;  Location: [facility];  Service: Endoscopy;  Laterality: N/A;   POLYPECTOMY  12/28/2020   Procedure: POLYPECTOMY;  Surgeon: Rollin Dover, MD;  Location: [facility];  Service: Endoscopy;;   Social History:  reports that she has never smoked. She has never used smokeless tobacco. She reports current alcohol use. She reports that she does not use drugs. Family History:  Family History  Problem Relation Age of Onset   Cancer Mother    Diabetes Mother    Hypertension Mother    Obesity Mother    Kidney disease Father    Hypertension Father    Hyperlipidemia Father      HOME MEDICATIONS: Allergies as of 06/26/2023   No Known Allergies      Medication List        Accurate as of June 26, 2023  8:01 AM. If you have any questions, ask your nurse or doctor.          acetaminophen  500 MG tablet Commonly known as: TYLENOL  Take 1,000 mg by mouth every 6 (six) hours as needed (pain).   amLODipine  5 MG tablet  Commonly known as: NORVASC  Take 1 tablet (5 mg total) by mouth daily.   amLODipine  10 MG tablet Commonly known as: NORVASC  Take 10 mg by mouth daily.   carvedilol 6.25 MG tablet Commonly known as: COREG Take 6.25 mg by mouth 2 (two) times daily.   CeleBREX 100 MG capsule Generic drug: celecoxib Take 100 mg by mouth daily.   Comfort Touch BP Cuff/Large Misc 1 Units by Does not apply route daily.   ibuprofen  800 MG tablet Commonly known as: ADVIL  Take 800 mg by mouth 3 (three) times daily.   lisinopril -hydrochlorothiazide  20-25 MG tablet Commonly known as: ZESTORETIC  Take 2 tablets by mouth daily.   Vitamin D  (Ergocalciferol ) 1.25 MG (50000 UNIT) Caps capsule Commonly known as: DRISDOL  Take 1 capsule (50,000 Units total) by mouth every 7 (seven) days.           OBJECTIVE:   PHYSICAL EXAM: VS: BP 128/72 (BP Location: Left Arm, Patient Position: Sitting, Cuff Size: Large)   Pulse 78   Ht 5' 5 (1.651 m)   Wt (!) 353 lb (160.1 kg)   SpO2 96%   BMI 58.74 kg/m    EXAM: General: Pt appears well and is in NAD  Neck: General: Supple without adenopathy. Thyroid : Right nodule appreciated.   Lungs: Clear with good BS bilat   Heart: Auscultation: RRR.  Extremities:  BL OZ:Umjrz pretibial edema   Mental Status: Judgment, insight: Intact Orientation: Oriented to time, place, and person Mood and affect: No depression, anxiety, or agitation     DATA REVIEWED:  Latest Reference Range & Units 06/26/23 08:24  TSH 0.40 - 4.50 mIU/L 0.61  Triiodothyronine,Free,Serum 2.3 - 4.2 pg/mL 3.0  T4,Free(Direct) 0.8 - 1.8 ng/dL 1.4     88/11/7975 TSH 9.78 uIU/mL FT4 1.4 ng/dL   FNA right superior 3.1 cm 04/23/2011: Benign        FNA right lower pole nodule 4 cm 2/9/20210: benign     Thyroid  Ultrasound 08/20/2020  The previously biopsied approximately 3.7 x 3.0 x 2.2 cm spongiform/benign-appearing mass involving the superior aspect the right lobe of the thyroid  (labeled 1), is unchanged compared to the 04/23/2011 examination, previously, 4.6 x 3.0 x 2.2 cm. Correlation with previous biopsy results is advised.   Previously noted 4.6 cm isoechoic ill-defined nodule involving the mid, posterior aspect the right lobe of the thyroid  is not definitely seen on the present examination and thus may have represented a pseudonodule in the setting of multinodular goiter.   The approximately 2.2 x 2.1 x 1.5 cm isoechoic nodule within the inferior medial aspect of the right lobe of the thyroid  (labeled 3), is unchanged compared to the 08/2013 examination, previously, 2.4 x 2.1 x 1.3 cm. Stability for greater than 5 years is indicative benign etiology.   _________________________________________________________   The approximately 1.3 cm  partially cystic though predominantly solid nodule within the inferior pole of the left lobe of the thyroid  (labeled 4) is grossly unchanged compared to the 2015 examination, previously, 1.9 cm. Imaging stability for greater than 5 years is indicative of a benign etiology.   The approximately 2.0 cm spongiform/benign-appearing nodule within mid aspect the left lobe of the thyroid  (labeled 5) is grossly unchanged compared to the 2015 examination, previously, 2.1 cm. Imaging stability for greater than 5 years is indicative of benign etiology.   IMPRESSION: 1. 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 No worrisome new or enlarging thyroid  nodules. 2. Previously biopsied dominant spongiform/benign-appearing right-sided thyroid  nodule/mass is grossly unchanged compared  to the 20/12 examination. Correlation with previous biopsy results is advised. Additionally, imaging stability for greater than 5 years is indicative of a benign etiology. 3. The remaining thyroid  nodules are grossly unchanged since at least the 2015 examination. Imaging stability for greater than 5 years is indicative of a benign etiology.    Uptake and Scan 01/07/2023  FINDINGS: Stable appearance of the thyroid , with continued photopenia upper lateral aspect right lobe thyroid . This corresponds to known right lobe thyroid  nodule that has been previously evaluated by biopsy. No other focal thyroid  abnormalities.   4 hour I-123 uptake = 9.2% (normal 5-20%)   24 hour I-123 uptake = 31.4% (normal 10-30%)   IMPRESSION: 1. Stable appearance of the thyroid . Normal iodine uptake at 4 hours, with borderline elevated uptake at 24 hours.     ASSESSMENT / PLAN / RECOMMENDATIONS:   Subclinical Hyperthyroidism:  -Patient is clinically euthyroid - TRAb negative in the past, but thyroid  uptake and scan more consistent with Graves' disease , showed photopenia at the site of the right previously biopsied  nodule -Patient has a history of  hepatitis C treatment , she would like to avoid thionamides therapy unless absolutely necessary due to concerns regarding hepatic injury -Repeat TFTs are normal   2. MNG:   - No local neck symptoms  - Reassurance has been provided as these nodules have not increased in size in over 5 yrs.  - No serial thyroid  ultrasound are indicated unless there's a clinical concern about change is size   3.  Resistant hypertension:  -Will proceed with aldosterone, renin, metanephrine, and dexamethasone  suppression test     F/U in 6 months    Signed electronically by: Stefano Redgie Butts, MD  [facility]  [facility] [address] [place] [place], [place] [address] Phone: [phone] FAX: [phone]      CC: Leontine Cramp, NP [address] [place] [address] Phone: [phone]  Fax: [phone]   Return to Endocrinology clinic as below: No future appointments.

## 2023-06-26 NOTE — Patient Instructions (Signed)
   Instructions for Dexamethasone Suppression Test   Step 1: Choose a morning when you can come to our lab at 8:00 am for a blood draw.   Step 2: On the night before the blood draw, take one 1 mg tablet of dexamethasone at 11:30 pm.  The timing is VERY important!   Step 3: The next morning, go to the lab for blood work at 8:00 am.  Julia Quin do not have to be on an empty stomach, but the timing is VERY important!

## 2023-07-03 LAB — ALDOSTERONE + RENIN ACTIVITY W/ RATIO
ALDO / PRA Ratio: 0.6 {ratio} — ABNORMAL LOW (ref 0.9–28.9)
Aldosterone: 8 ng/dL
Renin Activity: 14 ng/mL/h — ABNORMAL HIGH (ref 0.25–5.82)

## 2023-07-03 LAB — METANEPHRINES, PLASMA
Metanephrine, Free: 44 pg/mL (ref <=57)
Normetanephrine, Free: 150 pg/mL — ABNORMAL HIGH (ref <=148)
Total Metanephrines-Plasma: 194 pg/mL (ref <=205)

## 2023-07-03 LAB — TSH: TSH: 0.61 m[IU]/L (ref 0.40–4.50)

## 2023-07-03 LAB — T4, FREE: Free T4: 1.4 ng/dL (ref 0.8–1.8)

## 2023-07-03 LAB — ACTH: C206 ACTH: 40 pg/mL (ref 6–50)

## 2023-07-03 LAB — CORTISOL: Cortisol, Plasma: 18.8 ug/dL

## 2023-07-03 LAB — T3, FREE: T3, Free: 3 pg/mL (ref 2.3–4.2)

## 2023-07-06 ENCOUNTER — Encounter: Payer: Self-pay | Admitting: Internal Medicine

## 2023-07-23 ENCOUNTER — Other Ambulatory Visit: Payer: Self-pay

## 2023-07-28 ENCOUNTER — Other Ambulatory Visit: Payer: Medicare Other

## 2023-07-31 LAB — CORTISOL: Cortisol, Plasma: 1.7 ug/dL — ABNORMAL LOW

## 2023-07-31 LAB — ACTH: C206 ACTH: 5 pg/mL — ABNORMAL LOW (ref 6–50)

## 2023-12-24 ENCOUNTER — Ambulatory Visit: Payer: Medicare Other | Admitting: Internal Medicine

## 2023-12-24 ENCOUNTER — Encounter: Payer: Self-pay | Admitting: Internal Medicine

## 2023-12-24 VITALS — BP 112/74 | HR 70 | Ht 65.0 in | Wt 367.0 lb

## 2023-12-24 DIAGNOSIS — E0590 Thyrotoxicosis, unspecified without thyrotoxic crisis or storm: Secondary | ICD-10-CM | POA: Diagnosis not present

## 2023-12-24 DIAGNOSIS — E0500 Thyrotoxicosis with diffuse goiter without thyrotoxic crisis or storm: Secondary | ICD-10-CM

## 2023-12-24 DIAGNOSIS — E042 Nontoxic multinodular goiter: Secondary | ICD-10-CM | POA: Diagnosis not present

## 2023-12-24 DIAGNOSIS — I1A0 Resistant hypertension: Secondary | ICD-10-CM | POA: Diagnosis not present

## 2023-12-24 NOTE — Progress Notes (Signed)
 Name: Julia Ware  MRN/ DOB: [id number], Oct 16, 1955    Age/ Sex: 68 y.o., female     PCP: Leontine Cramp, NP   Reason for Endocrinology Evaluation: MNG     Initial Endocrinology Clinic Visit: 10/30/2020    PATIENT IDENTIFIER: Julia Ware is a 68 y.o., female with a past medical history of HTN and MNG. She has followed with [facility] since 10/30/2020 for consultative assistance with management of her MNG.   HISTORICAL SUMMARY:   She has an incidental finding of thyroid  nodule in CT scan in 2009. This prompted thyroid  ultrasound confirming MNG with hx of benign FNA in 2010 ( right inferior ) and 2012 ( right superior ) nodules    She also had a thyroid  uptake and scan in 2009 with hyperthyroidism with normal uptake at 16.8%.    MNG stability confirmed over a 5 year period and no serial ultrasound was recommended  Thyroid  uptake and scan 12/2022 showed photopenia at the previously biopsied right thyroid  nodule with slight increase 24-hour uptake at 31.4 %   No FH of thyroid  disease    Resistant hypertension:  -Labs showed normal aldosterone , elevated renin, and low Aldo/PRA ratio as well as appropriately low cortisol with dexamethasone  suppression test  06/2023 - She did have slight elevation of plasma normetanephrine 150 PG/mL (reference< 148) , with normal metanephrine and total metanephrines    SUBJECTIVE:    Today (12/24/2023):  [name] is here for a follow up on MNG and subclinical hyperthyroidism.    Denies local neck swelling  Stable occasional  palpitations  Has changed in bowel movents depending on  what she eats  She has tearing of the eyes especially in the morning for years  Denies tremors  No Biotin    She was treated for Hep C through [facility] in the past     HISTORY:  Past Medical History:  Past Medical History:  Diagnosis Date   Back pain    Bilateral swelling of feet    Constipation    Hepatitis C    High blood  pressure    Hypertension    Hyperthyroidism    Joint pain    Prediabetes    Thyroid  nodule    Vitamin D  deficiency    Past Surgical History:  Past Surgical History:  Procedure Laterality Date   COLONOSCOPY WITH PROPOFOL  N/A 12/28/2020   Procedure: COLONOSCOPY WITH PROPOFOL ;  Surgeon: Rollin Dover, MD;  Location: [facility];  Service: Endoscopy;  Laterality: N/A;   POLYPECTOMY  12/28/2020   Procedure: POLYPECTOMY;  Surgeon: Rollin Dover, MD;  Location: [facility];  Service: Endoscopy;;   Social History:  reports that she has never smoked. She has never used smokeless tobacco. She reports current alcohol use. She reports that she does not use drugs. Family History:  Family History  Problem Relation Age of Onset   Cancer Mother    Diabetes Mother    Hypertension Mother    Obesity Mother    Kidney disease Father    Hypertension Father    Hyperlipidemia Father      HOME MEDICATIONS: Allergies as of 12/24/2023   No Known Allergies      Medication List        Accurate as of December 24, 2023  7:59 AM. If you have any questions, ask your nurse or doctor.          acetaminophen  500 MG tablet Commonly known as: TYLENOL  Take 1,000  mg by mouth every 6 (six) hours as needed (pain).   amLODipine  5 MG tablet Commonly known as: NORVASC  Take 1 tablet (5 mg total) by mouth daily.   amLODipine  10 MG tablet Commonly known as: NORVASC  Take 10 mg by mouth daily.   amLODIPine -Valsartan-HCTZ 10-320-25 MG Tabs   carvedilol 6.25 MG tablet Commonly known as: COREG Take 6.25 mg by mouth 2 (two) times daily.   CeleBREX 100 MG capsule Generic drug: celecoxib Take 100 mg by mouth daily.   Comfort Touch BP Cuff/Large Misc 1 Units by Does not apply route daily.   ibuprofen  800 MG tablet Commonly known as: ADVIL  Take 800 mg by mouth 3 (three) times daily.   lisinopril -hydrochlorothiazide  20-25 MG tablet Commonly known as: ZESTORETIC  Take 2 tablets by mouth daily.   Vitamin  D (Ergocalciferol ) 1.25 MG (50000 UNIT) Caps capsule Commonly known as: DRISDOL  Take 1 capsule (50,000 Units total) by mouth every 7 (seven) days.          OBJECTIVE:   PHYSICAL EXAM: VS: BP 112/74 (BP Location: Left Arm, Patient Position: Sitting, Cuff Size: Normal)   Pulse 70   Ht 5' 5 (1.651 m)   Wt (!) 367 lb (166.5 kg)   SpO2 99%   BMI 61.07 kg/m    EXAM: General: Pt appears well and is in NAD  Neck: General: Supple without adenopathy. Thyroid : Right nodule appreciated.   Lungs: Clear with good BS bilat   Heart: Auscultation: RRR.  Extremities:  BL OZ:Umjrz pretibial edema   Mental Status: Judgment, insight: Intact Orientation: Oriented to time, place, and person Mood and affect: No depression, anxiety, or agitation     DATA REVIEWED:   Latest Reference Range & Units 12/24/23 08:17  TSH 0.40 - 4.50 mIU/L 0.59  Triiodothyronine,Free,Serum 2.3 - 4.2 pg/mL 3.1  T4,Free(Direct) 0.8 - 1.8 ng/dL 1.5  Thyroid  stimulating immunoglobulin  Rpt (IP)     Latest Reference Range & Units 06/26/23 08:24 07/28/23 08:00  C206 ACTH  6 - 50 pg/mL 40 5 (L)  ALDOSTERONE  ng/dL 8   ALDO / PRA Ratio 0.9 - 28.9 Ratio 0.6 (L)   Cortisol, Plasma mcg/dL 81.1 1.7 (L)  Metanephrine, Pl <=57 pg/mL 44   Normetanephrine, Pl <=148 pg/mL 150 (H)   TSH 0.40 - 4.50 mIU/L 0.61   Triiodothyronine,Free,Serum 2.3 - 4.2 pg/mL 3.0   T4,Free(Direct) 0.8 - 1.8 ng/dL 1.4   Renin Activity 9.74 - 5.82 ng/mL/h 14.00 (H)    04/18/2021 TSH 0.21 uIU/mL FT4 1.4 ng/dL   FNA right superior 3.1 cm 04/23/2011: Benign        FNA right lower pole nodule 4 cm 2/9/20210: benign     Thyroid  Ultrasound 08/20/2020  The previously biopsied approximately 3.7 x 3.0 x 2.2 cm spongiform/benign-appearing mass involving the superior aspect the right lobe of the thyroid  (labeled 1), is unchanged compared to the 04/23/2011 examination, previously, 4.6 x 3.0 x 2.2 cm. Correlation with previous biopsy results is  advised.   Previously noted 4.6 cm isoechoic ill-defined nodule involving the mid, posterior aspect the right lobe of the thyroid  is not definitely seen on the present examination and thus may have represented a pseudonodule in the setting of multinodular goiter.   The approximately 2.2 x 2.1 x 1.5 cm isoechoic nodule within the inferior medial aspect of the right lobe of the thyroid  (labeled 3), is unchanged compared to the 08/2013 examination, previously, 2.4 x 2.1 x 1.3 cm. Stability for greater than 5 years is indicative benign  etiology.   _________________________________________________________   The approximately 1.3 cm partially cystic though predominantly solid nodule within the inferior pole of the left lobe of the thyroid  (labeled 4) is grossly unchanged compared to the 2015 examination, previously, 1.9 cm. Imaging stability for greater than 5 years is indicative of a benign etiology.   The approximately 2.0 cm spongiform/benign-appearing nodule within mid aspect the left lobe of the thyroid  (labeled 5) is grossly unchanged compared to the 2015 examination, previously, 2.1 cm. Imaging stability for greater than 5 years is indicative of benign etiology.   IMPRESSION: 1. 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 No worrisome new or enlarging thyroid  nodules. 2. Previously biopsied dominant spongiform/benign-appearing right-sided thyroid  nodule/mass is grossly unchanged compared to the 20/12 examination. Correlation with previous biopsy results is advised. Additionally, imaging stability for greater than 5 years is indicative of a benign etiology. 3. The remaining thyroid  nodules are grossly unchanged since at least the 2015 examination. Imaging stability for greater than 5 years is indicative of a benign etiology.    Uptake and Scan 01/07/2023  FINDINGS: Stable appearance of the thyroid , with continued photopenia upper lateral aspect right lobe  thyroid . This corresponds to known right lobe thyroid  nodule that has been previously evaluated by biopsy. No other focal thyroid  abnormalities.   4 hour I-123 uptake = 9.2% (normal 5-20%)   24 hour I-123 uptake = 31.4% (normal 10-30%)   IMPRESSION: 1. Stable appearance of the thyroid . Normal iodine uptake at 4 hours, with borderline elevated uptake at 24 hours.     ASSESSMENT / PLAN / RECOMMENDATIONS:   Subclinical Hyperthyroidism:  -Patient is clinically euthyroid - TRAb negative in the past, but thyroid  uptake and scan more consistent with Graves' disease , showed photopenia at the site of the  previously biopsied right nodule -Patient has a history of  hepatitis C treatment , she would like to avoid thionamides therapy unless absolutely necessary due to concerns regarding hepatic injury -Repeat TFTs remain normal, no intervention  2. MNG:   - No local neck symptoms  - Reassurance has been provided as these nodules have not increased in size in over 5 yrs.  - No serial thyroid  ultrasound are indicated unless there's a clinical concern about change is size   3.  Graves' disease:  - Despite a negative TRAb, her uptake and scan was consistent with Graves' disease - Patient with tearing of the eye, patient encouraged to schedule a follow-up with ophthalmology and notify them of this diagnosis     F/U in 6 months    Signed electronically by: Stefano Redgie Butts, MD  [facility]  [facility] [address] [place] [place], [place] [address] Phone: [phone] FAX: [phone]      CC: Leontine Cramp, NP [address] [place] [address] Phone: [phone]  Fax: [phone]   Return to Endocrinology clinic as below: No future appointments.

## 2023-12-25 ENCOUNTER — Ambulatory Visit: Payer: Self-pay | Admitting: Internal Medicine

## 2023-12-28 LAB — TSH: TSH: 0.59 m[IU]/L (ref 0.40–4.50)

## 2023-12-28 LAB — THYROID STIMULATING IMMUNOGLOBULIN: TSI: <89 %{baseline} (ref <140)

## 2023-12-28 LAB — T3, FREE: T3, Free: 3.1 pg/mL (ref 2.3–4.2)

## 2023-12-28 LAB — T4, FREE: Free T4: 1.5 ng/dL (ref 0.8–1.8)

## 2024-01-11 ENCOUNTER — Emergency Department (HOSPITAL_BASED_OUTPATIENT_CLINIC_OR_DEPARTMENT_OTHER)

## 2024-01-11 ENCOUNTER — Other Ambulatory Visit: Payer: Self-pay

## 2024-01-11 ENCOUNTER — Encounter (HOSPITAL_BASED_OUTPATIENT_CLINIC_OR_DEPARTMENT_OTHER): Payer: Self-pay | Admitting: Emergency Medicine

## 2024-01-11 ENCOUNTER — Emergency Department (HOSPITAL_COMMUNITY)

## 2024-01-11 ENCOUNTER — Emergency Department (HOSPITAL_BASED_OUTPATIENT_CLINIC_OR_DEPARTMENT_OTHER)
Admission: EM | Admit: 2024-01-11 | Discharge: 2024-01-11 | Disposition: A | Discharge status: Home / Self Care | Attending: Emergency Medicine | Admitting: Emergency Medicine

## 2024-01-11 DIAGNOSIS — H538 Other visual disturbances: Secondary | ICD-10-CM | POA: Insufficient documentation

## 2024-01-11 DIAGNOSIS — R600 Localized edema: Secondary | ICD-10-CM | POA: Insufficient documentation

## 2024-01-11 DIAGNOSIS — M7989 Other specified soft tissue disorders: Secondary | ICD-10-CM | POA: Insufficient documentation

## 2024-01-11 DIAGNOSIS — I10 Essential (primary) hypertension: Secondary | ICD-10-CM | POA: Diagnosis not present

## 2024-01-11 DIAGNOSIS — R42 Dizziness and giddiness: Secondary | ICD-10-CM | POA: Insufficient documentation

## 2024-01-11 DIAGNOSIS — Z79899 Other long term (current) drug therapy: Secondary | ICD-10-CM | POA: Diagnosis not present

## 2024-01-11 LAB — COMPREHENSIVE METABOLIC PANEL WITH GFR
ALT: 50 U/L — ABNORMAL HIGH (ref 0–44)
AST: 45 U/L — ABNORMAL HIGH (ref 15–41)
Albumin: 4.2 g/dL (ref 3.5–5.0)
Alkaline Phosphatase: 71 U/L (ref 38–126)
Anion gap: 12 (ref 5–15)
BUN: 13 mg/dL (ref 8–23)
CO2: 24 mmol/L (ref 22–32)
Calcium: 9.5 mg/dL (ref 8.9–10.3)
Chloride: 104 mmol/L (ref 98–111)
Creatinine, Ser: 1.01 mg/dL — ABNORMAL HIGH (ref 0.44–1.00)
GFR, Estimated: 60 mL/min — ABNORMAL LOW (ref >60)
Glucose, Bld: 112 mg/dL — ABNORMAL HIGH (ref 70–99)
Potassium: 4.2 mmol/L (ref 3.5–5.1)
Sodium: 140 mmol/L (ref 135–145)
Total Bilirubin: 0.6 mg/dL (ref 0.0–1.2)
Total Protein: 7.4 g/dL (ref 6.5–8.1)

## 2024-01-11 LAB — URINALYSIS, ROUTINE W REFLEX MICROSCOPIC
Bilirubin Urine: NEGATIVE
Glucose, UA: NEGATIVE mg/dL
Ketones, ur: NEGATIVE mg/dL
Leukocytes,Ua: NEGATIVE
Nitrite: POSITIVE — AB
Protein, ur: NEGATIVE mg/dL
Specific Gravity, Urine: 1.015 (ref 1.005–1.030)
pH: 6 (ref 5.0–8.0)

## 2024-01-11 LAB — URINALYSIS, MICROSCOPIC (REFLEX)

## 2024-01-11 LAB — CBC WITH DIFFERENTIAL/PLATELET
Abs Immature Granulocytes: 0.04 K/uL (ref 0.00–0.07)
Basophils Absolute: 0 K/uL (ref 0.0–0.1)
Basophils Relative: 1 %
Eosinophils Absolute: 0.1 K/uL (ref 0.0–0.5)
Eosinophils Relative: 1 %
HCT: 36.3 % (ref 36.0–46.0)
Hemoglobin: 11.6 g/dL — ABNORMAL LOW (ref 12.0–15.0)
Immature Granulocytes: 1 %
Lymphocytes Relative: 27 %
Lymphs Abs: 1.8 K/uL (ref 0.7–4.0)
MCH: 25.2 pg — ABNORMAL LOW (ref 26.0–34.0)
MCHC: 32 g/dL (ref 30.0–36.0)
MCV: 78.9 fL — ABNORMAL LOW (ref 80.0–100.0)
Monocytes Absolute: 0.5 K/uL (ref 0.1–1.0)
Monocytes Relative: 8 %
Neutro Abs: 4.2 K/uL (ref 1.7–7.7)
Neutrophils Relative %: 62 %
Platelets: 190 K/uL (ref 150–400)
RBC: 4.6 MIL/uL (ref 3.87–5.11)
RDW: 14.4 % (ref 11.5–15.5)
WBC: 6.7 K/uL (ref 4.0–10.5)
nRBC: 0 % (ref 0.0–0.2)

## 2024-01-11 LAB — PRO BRAIN NATRIURETIC PEPTIDE: Pro Brain Natriuretic Peptide: 652 pg/mL — ABNORMAL HIGH (ref <300.0)

## 2024-01-11 LAB — TROPONIN T, HIGH SENSITIVITY
Troponin T High Sensitivity: <15 ng/L (ref <19)
Troponin T High Sensitivity: <15 ng/L (ref <19)

## 2024-01-11 LAB — CBG MONITORING, ED: Glucose-Capillary: 112 mg/dL — ABNORMAL HIGH (ref 70–99)

## 2024-01-11 MED ORDER — MECLIZINE HCL 25 MG PO TABS: 25.0000 mg | ORAL_TABLET | Freq: Three times a day (TID) | ORAL | 0 refills | Status: AC | PRN

## 2024-01-11 MED ORDER — MECLIZINE HCL 25 MG PO TABS
50.0000 mg | ORAL_TABLET | Freq: Once | ORAL | Status: AC
  Administered 2024-01-11: 50 mg via ORAL
  Filled 2024-01-11: qty 2

## 2024-01-11 NOTE — ED Triage Notes (Addendum)
 Dizziness blurred vision  h/a since yesterday swollen ankles x several months that her dr is aware of already

## 2024-01-11 NOTE — ED Notes (Signed)
 Pt ambulatory with steady gait and assistance with cane. Reported she feels weaker than normal.

## 2024-01-11 NOTE — Discharge Instructions (Addendum)
 Use meclizine  as needed for persistent vertigo, prescription printed. Follow-up with your local doctor for reassessment. Work note provided

## 2024-01-11 NOTE — ED Notes (Signed)
Ambulatory to BR with cane

## 2024-01-11 NOTE — ED Notes (Signed)
 Techs currently looking for a hallway monitor

## 2024-01-11 NOTE — ED Notes (Signed)
 Iv secured pt to [facility] for MRI POV

## 2024-01-11 NOTE — ED Provider Notes (Signed)
 [place] [facility] AT [facility] Provider Note   CSN: [id number] Arrival date & time: 01/11/24  1014     Patient presents with: Dizziness   Julia Ware is a 68 y.o. female.   HPI 58 old female with a history of hypertension presents with dizziness and blurred vision.  Symptoms started when she first woke up yesterday.  She removed her CPAP and sat up and felt dizzy.  Feels like she is lightheaded.  Also having diffuse blurry vision, she had to put on her glasses to watch TV which is not typical.  She feels like the dizziness was they are mostly all day yesterday.  When she woke up this morning it was there again and a little worse.  The dizziness seems to be constant but worse when she tries to get up.  She is able to walk with her cane which is her normal baseline.  She has a mild frontal headache.  No fevers, weakness or numbness, chest pain, shortness of breath, vomiting, diarrhea.  She states her blood pressure was around 160/89 when she checked it yesterday morning.  She has been compliant with her meds, taking them this morning.  She has been having bilateral lower extremity edema for the past several weeks and has followed up with cardiology.  Prior to Admission medications   Medication Sig Start Date End Date Taking? Authorizing Provider  acetaminophen  (TYLENOL ) 500 MG tablet Take 1,000 mg by mouth every 6 (six) hours as needed (pain).    [provider]  amLODipine  (NORVASC ) 10 MG tablet Take 10 mg by mouth daily. Patient not taking: Reported on 12/24/2023 05/23/23   [provider]  amLODipine  (NORVASC ) 5 MG tablet Take 1 tablet (5 mg total) by mouth daily. 09/29/22   Jonel Rockie BIRCH, NP  amLODIPine -Valsartan-HCTZ 10-320-25 MG TABS  10/12/23   [provider]  Blood Pressure Monitoring (COMFORT TOUCH BP CUFF/LARGE) MISC 1 Units by Does not apply route daily. 08/26/22   Danford, Rockie D, NP  carvedilol (COREG) 6.25 MG tablet Take 6.25  mg by mouth 2 (two) times daily. 08/28/20   [provider]  CELEBREX 100 MG capsule Take 100 mg by mouth daily. 01/01/23   [provider]  ibuprofen  (ADVIL ) 800 MG tablet Take 800 mg by mouth 3 (three) times daily. 08/29/21   [provider]  lisinopril -hydrochlorothiazide  (ZESTORETIC ) 20-25 MG tablet Take 2 tablets by mouth daily. Patient not taking: Reported on 12/24/2023 11/03/22   Jonel Rockie D, NP  Vitamin D , Ergocalciferol , (DRISDOL ) 1.25 MG (50000 UNIT) CAPS capsule Take 1 capsule (50,000 Units total) by mouth every 7 (seven) days. 03/26/23   Danford, Katy D, NP    Allergies: Patient has no known allergies.    Review of Systems  Eyes:  Positive for visual disturbance.  Respiratory:  Negative for shortness of breath.   Cardiovascular:  Negative for chest pain.  Gastrointestinal:  Negative for abdominal pain, diarrhea and vomiting.  Neurological:  Positive for dizziness and headaches. Negative for weakness and numbness.    Updated Vital Signs BP 116/69   Pulse (!) 55   Temp 97.8 F (36.6 C)   Resp 18   Ht 5' 5 (1.651 m)   Wt (!) 163.3 kg   SpO2 100%   BMI 59.91 kg/m   Physical Exam Vitals and nursing note reviewed.  Constitutional:      General: She is not in acute distress.    Appearance: She is well-developed.  She is not ill-appearing or diaphoretic.  HENT:     Head: Normocephalic and atraumatic.  Eyes:     Extraocular Movements: Extraocular movements intact.     Pupils: Pupils are equal, round, and reactive to light.  Cardiovascular:     Rate and Rhythm: Normal rate and regular rhythm.     Heart sounds: Normal heart sounds.  Pulmonary:     Effort: Pulmonary effort is normal.     Breath sounds: Normal breath sounds.  Abdominal:     Palpations: Abdomen is soft.     Tenderness: There is no abdominal tenderness.  Musculoskeletal:     Right lower leg: Edema present.     Left lower leg: Edema present.     Comments: Symmetric swelling of  both feet, ankles, lower legs  Skin:    General: Skin is warm and dry.  Neurological:     Mental Status: She is alert.     Comments: CN 3-12 grossly intact. 5/5 strength in all 4 extremities. Grossly normal sensation. Normal finger to nose.      (all labs ordered are listed, but only abnormal results are displayed) Labs Reviewed  COMPREHENSIVE METABOLIC PANEL WITH GFR - Abnormal; Notable for the following components:      Result Value   Glucose, Bld 112 (*)    Creatinine, Ser 1.01 (*)    AST 45 (*)    ALT 50 (*)    GFR, Estimated 60 (*)    All other components within normal limits  PRO BRAIN NATRIURETIC PEPTIDE - Abnormal; Notable for the following components:   Pro Brain Natriuretic Peptide 652.0 (*)    All other components within normal limits  CBC WITH DIFFERENTIAL/PLATELET - Abnormal; Notable for the following components:   Hemoglobin 11.6 (*)    MCV 78.9 (*)    MCH 25.2 (*)    All other components within normal limits  CBG MONITORING, ED - Abnormal; Notable for the following components:   Glucose-Capillary 112 (*)    All other components within normal limits  TROPONIN T, HIGH SENSITIVITY  TROPONIN T, HIGH SENSITIVITY    EKG: EKG Interpretation Date/Time:  Monday January 11 2024 10:24:07 EDT Ventricular Rate:  56 PR Interval:  192 QRS Duration:  102 QT Interval:  455 QTC Calculation: 440 R Axis:   -20  Text Interpretation: Sinus rhythm Ventricular trigeminy Borderline left axis deviation Low voltage, precordial leads Abnormal R-wave progression, early transition Confirmed by Freddi Hamilton [phone]) on 01/11/2024 10:26:32 AM  Radiology: DG Chest 2 View Result Date: 01/11/2024 CLINICAL DATA:  Dizziness. EXAM: CHEST - 2 VIEW COMPARISON:  PA and lateral radiographs the chest dated October 14, 2011. FINDINGS: The heart size and mediastinal contours are within normal limits. Both lungs are clear. The visualized skeletal structures are unremarkable. IMPRESSION: No active  cardiopulmonary disease. Electronically Signed   By: Evalene Coho M.D.   On: 01/11/2024 11:11   CT Head Wo Contrast Result Date: 01/11/2024 CLINICAL DATA:  Neuro deficit, acute, stroke suspected EXAM: CT HEAD WITHOUT CONTRAST TECHNIQUE: Contiguous axial images were obtained from the base of the skull through the vertex without intravenous contrast. RADIATION DOSE REDUCTION: This exam was performed according to the departmental dose-optimization program which includes automated exposure control, adjustment of the mA and/or kV according to patient size and/or use of iterative reconstruction technique. COMPARISON:  CT the head dated October 14, 2011. FINDINGS: Brain: There is mild periventricular white matter disease. The brain otherwise appears normal. There is no evidence  of hemorrhage, mass, cortical infarct or hydrocephalus. Vascular: Moderate calcific atheromatous disease within the carotid siphons. Skull: Intact and unremarkable. Sinuses/Orbits: Clear paranasal sinuses and mastoid air cells. Normal orbits. Other: None. IMPRESSION: 1. Mild periventricular white matter disease. Electronically Signed   By: Evalene Coho M.D.   On: 01/11/2024 11:10     Procedures   Medications Ordered in the ED - No data to display                                  Medical Decision Making Amount and/or Complexity of Data Reviewed External Data Reviewed: labs, radiology and notes.    Details: Cardiology notes, TTE results Labs: ordered.    Details: Nonspecific proBNP Radiology: ordered and independent interpretation performed.    Details: No head bleed ECG/medicine tests: ordered and independent interpretation performed.    Details: No acute ischemia   Patient presents with dizziness and blurred vision.  The blurred vision is diffuse rather than focal.  She has no focal neurodeficits.  She is able to ambulate but feels dizzy.  On orthostatic she felt dizzy with standing though her blood pressure  actually went up.  She does have some risk factors for stroke including age and hypertension.  I think is reasonable to get an MRI based on her symptoms with no other clear cause.  Will send second troponin, but I have low suspicion this is ACS.  Discussed with EDP at [facility], Dr. Neysa, who accepts in transfer ED to ED for MRI.  Patient will go POV.  She does have leg swelling and has had a recent echo that did not show any systolic dysfunction or obvious diastolic dysfunction.  Her proBNP is in a nonspecific range for her age.  She will need outpatient cardiology follow-up.  I doubt this is acute CHF.     Final diagnoses:  Dizziness    ED Discharge Orders     None          Freddi Hamilton, MD 01/11/24 1238

## 2024-01-11 NOTE — ED Provider Notes (Signed)
 Patient transferred here from medicine at [facility] to have MRI to rule out central cause of vertigo.  Patient symptoms are worse with certain head movements.  Will give dose of Antivert  here.  MRI has been ordered.  Will sign off to next provider   Dasie Faden, MD 01/11/24 1431

## 2024-01-11 NOTE — ED Notes (Addendum)
 Sent from [facility]/ for MRI d/t dizziness/ pt placed in WC/ 18 G to right AC, wrapped for security

## 2024-01-11 NOTE — ED Provider Notes (Signed)
 MRI results independent reviewed no acute stroke no acute findings.  Vital signs reassuring transient mild bradycardia.  On reassessment patient feels improved.  Reviewed MRI results with the patient.  Discussed close outpatient follow-up she has someone to stay with.  Medication prescription as needed.  Patient has chronic leg edema that we will need follow-up with primary doctor.  Fonda CHRISTELLA Tonia Tonia Fonda, MD 01/11/24 [phone]

## 2024-01-11 NOTE — ED Notes (Signed)
 Patient transported to MRI

## 2024-01-11 NOTE — ED Notes (Signed)
 MRI called to ask questions about PT and to inform me that Transport is on their way to get here

## 2024-06-27 ENCOUNTER — Encounter: Payer: Self-pay | Admitting: Internal Medicine

## 2024-06-27 ENCOUNTER — Other Ambulatory Visit: Payer: Medicare (Managed Care)

## 2024-06-27 ENCOUNTER — Ambulatory Visit: Payer: Medicare (Managed Care) | Admitting: Internal Medicine

## 2024-06-27 VITALS — BP 150/70 | HR 60 | Ht 65.0 in | Wt 353.0 lb

## 2024-06-27 DIAGNOSIS — E042 Nontoxic multinodular goiter: Secondary | ICD-10-CM

## 2024-06-27 DIAGNOSIS — E0500 Thyrotoxicosis with diffuse goiter without thyrotoxic crisis or storm: Secondary | ICD-10-CM | POA: Diagnosis not present

## 2024-06-27 NOTE — Progress Notes (Unsigned)
 "  Name: Julia Ware  MRN/ DOB: [id number], 02/09/1956    Age/ Sex: 69 y.o., female     PCP: Leontine Cramp, NP   Reason for Endocrinology Evaluation: MNG     Initial Endocrinology Clinic Visit: 10/30/2020    PATIENT IDENTIFIER: Julia Ware is a 69 y.o., female with a past medical history of HTN and MNG. She has followed with [facility] since 10/30/2020 for consultative assistance with management of her MNG.   HISTORICAL SUMMARY:   She has an incidental finding of thyroid  nodule in CT scan in 2009. This prompted thyroid  ultrasound confirming MNG with hx of benign FNA in 2010 ( right inferior ) and 2012 ( right superior ) nodules    She also had a thyroid  uptake and scan in 2009 with hyperthyroidism with normal uptake at 16.8%.    MNG stability confirmed over a 5 year period and no serial ultrasound was recommended  Thyroid  uptake and scan 12/2022 showed photopenia at the previously biopsied right thyroid  nodule with slight increase 24-hour uptake at 31.4 %   No FH of thyroid  disease    Resistant hypertension:  -Labs showed normal aldosterone , elevated renin, and low Aldo/PRA ratio as well as appropriately low cortisol with dexamethasone  suppression test  06/2023 - She did have slight elevation of plasma normetanephrine 150 PG/mL (reference< 148) , with normal metanephrine and total metanephrines    SUBJECTIVE:    Today (06/27/2024):  Julia Ware is here for a follow up on MNG and subclinical hyperthyroidism.   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   No lo al neck swelling  No palpitations  She has occasional constipation  No Biotin  She did have an updated ophthalmological exam, she was provided with eyedrops  She was treated for Hep C through [facility] in the past     HISTORY:  Past Medical History:  Past Medical History:  Diagnosis Date   Back pain    Bilateral swelling of  feet    Constipation    Hepatitis C    High blood pressure    Hypertension    Hyperthyroidism    Joint pain    Prediabetes    Thyroid  nodule    Vitamin D  deficiency    Past Surgical History:  Past Surgical History:  Procedure Laterality Date   COLONOSCOPY WITH PROPOFOL  N/A 12/28/2020   Procedure: COLONOSCOPY WITH PROPOFOL ;  Surgeon: Rollin Dover, MD;  Location: [facility];  Service: Endoscopy;  Laterality: N/A;   POLYPECTOMY  12/28/2020   Procedure: POLYPECTOMY;  Surgeon: Rollin Dover, MD;  Location: [facility];  Service: Endoscopy;;   Social History:  reports that she has never smoked. She has never used smokeless tobacco. She reports current alcohol use. She reports that she does not use drugs. Family History:  Family History  Problem Relation Age of Onset   Cancer Mother    Diabetes Mother    Hypertension Mother    Obesity Mother    Kidney disease Father    Hypertension Father    Hyperlipidemia Father      HOME MEDICATIONS: Allergies as of 06/27/2024   No Known Allergies      Medication List        Accurate as of June 27, 2024  8:16 AM. If you have any questions, ask your nurse or doctor.          STOP taking these  medications    lisinopril -hydrochlorothiazide  20-25 MG tablet Commonly known as: ZESTORETIC  Stopped by: Donell Butts, MD       TAKE these medications    acetaminophen  500 MG tablet Commonly known as: TYLENOL  Take 1,000 mg by mouth every 6 (six) hours as needed (pain).   amLODipine  5 MG tablet Commonly known as: NORVASC  Take 1 tablet (5 mg total) by mouth daily. What changed: Another medication with the same name was removed. Continue taking this medication, and follow the directions you see here. Changed by: Donell Butts, MD   amLODipine -valsartan 10-320 MG tablet Commonly known as: EXFORGE Take 1 tablet by mouth daily.   amLODIPine -Valsartan-HCTZ 10-320-25 MG Tabs   carvedilol 6.25 MG tablet Commonly known  as: COREG Take 6.25 mg by mouth 2 (two) times daily.   CeleBREX 100 MG capsule Generic drug: celecoxib Take 100 mg by mouth daily.   Comfort Touch BP Cuff/Large Misc 1 Units by Does not apply route daily.   furosemide 20 MG tablet Commonly known as: LASIX Take 20 mg by mouth every other day.   ibuprofen  800 MG tablet Commonly known as: ADVIL  Take 800 mg by mouth 3 (three) times daily.   meclizine  25 MG tablet Commonly known as: ANTIVERT  Take 1 tablet (25 mg total) by mouth 3 (three) times daily as needed for dizziness.   Vitamin D  (Ergocalciferol ) 1.25 MG (50000 UNIT) Caps capsule Commonly known as: DRISDOL  Take 1 capsule (50,000 Units total) by mouth every 7 (seven) days.          OBJECTIVE:   PHYSICAL EXAM: VS: BP (!) 150/70   Pulse 60   Ht 5' 5 (1.651 m)   Wt (!) 353 lb (160.1 kg)   SpO2 99%   BMI 58.74 kg/m    EXAM: General: Pt appears well and is in NAD  Neck: General: Supple without adenopathy. Thyroid : Right nodule appreciated.   Lungs: Clear with good BS bilat   Heart: Auscultation: RRR.  Extremities:  BL OZ:Umjrz pretibial edema   Mental Status: Judgment, insight: Intact Orientation: Oriented to time, place, and person Mood and affect: No depression, anxiety, or agitation     DATA REVIEWED:   Latest Reference Range & Units 12/24/23 08:17  TSH 0.40 - 4.50 mIU/L 0.59  Triiodothyronine,Free,Serum 2.3 - 4.2 pg/mL 3.1  T4,Free(Direct) 0.8 - 1.8 ng/dL 1.5  Thyroid  stimulating immunoglobulin  Rpt (IP)    04/18/2021 TSH 0.21 uIU/mL FT4 1.4 ng/dL   FNA right superior 3.1 cm 04/23/2011: Benign        FNA right lower pole nodule 4 cm 2/9/20210: benign     Thyroid  Ultrasound 08/20/2020  The previously biopsied approximately 3.7 x 3.0 x 2.2 cm spongiform/benign-appearing mass involving the superior aspect the right lobe of the thyroid  (labeled 1), is unchanged compared to the 04/23/2011 examination, previously, 4.6 x 3.0 x 2.2 cm.  Correlation with previous biopsy results is advised.   Previously noted 4.6 cm isoechoic ill-defined nodule involving the mid, posterior aspect the right lobe of the thyroid  is not definitely seen on the present examination and thus may have represented a pseudonodule in the setting of multinodular goiter.   The approximately 2.2 x 2.1 x 1.5 cm isoechoic nodule within the inferior medial aspect of the right lobe of the thyroid  (labeled 3), is unchanged compared to the 08/2013 examination, previously, 2.4 x 2.1 x 1.3 cm. Stability for greater than 5 years is indicative benign etiology.   _________________________________________________________   The approximately 1.3 cm partially cystic  though predominantly solid nodule within the inferior pole of the left lobe of the thyroid  (labeled 4) is grossly unchanged compared to the 2015 examination, previously, 1.9 cm. Imaging stability for greater than 5 years is indicative of a benign etiology.   The approximately 2.0 cm spongiform/benign-appearing nodule within mid aspect the left lobe of the thyroid  (labeled 5) is grossly unchanged compared to the 2015 examination, previously, 2.1 cm. Imaging stability for greater than 5 years is indicative of benign etiology.   IMPRESSION: 1. 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 No worrisome new or enlarging thyroid  nodules. 2. Previously biopsied dominant spongiform/benign-appearing right-sided thyroid  nodule/mass is grossly unchanged compared to the 20/12 examination. Correlation with previous biopsy results is advised. Additionally, imaging stability for greater than 5 years is indicative of a benign etiology. 3. The remaining thyroid  nodules are grossly unchanged since at least the 2015 examination. Imaging stability for greater than 5 years is indicative of a benign etiology.    Uptake and Scan 01/07/2023  FINDINGS: Stable appearance of the thyroid , with continued  photopenia upper lateral aspect right lobe thyroid . This corresponds to known right lobe thyroid  nodule that has been previously evaluated by biopsy. No other focal thyroid  abnormalities.   4 hour I-123 uptake = 9.2% (normal 5-20%)   24 hour I-123 uptake = 31.4% (normal 10-30%)   IMPRESSION: 1. Stable appearance of the thyroid . Normal iodine uptake at 4 hours, with borderline elevated uptake at 24 hours.     ASSESSMENT / PLAN / RECOMMENDATIONS:   Hx of Subclinical Hyperthyroidism:  -Patient continues to be clinically euthyroid - TRAb negative in the past, but thyroid  uptake and scan more consistent with Graves' disease, showed photopenia at the site of the  previously biopsied right nodule -Patient has a history of  hepatitis C treatment , she would like to avoid thionamides therapy unless absolutely necessary due to concerns regarding hepatic injury -Repeat TFTs ****    2. MNG:   - No local neck symptoms -Patient had 5-year stability of thyroid  nodules on ultrasound - No serial thyroid  ultrasound are indicated unless there's a clinical concern about change is size   3.  Graves' disease:  - Despite a negative TRAb, her uptake and scan was consistent with Graves' disease -Up-to-date on eye exam   F/U in 6 months    Signed electronically by: Stefano Redgie Butts, MD  [facility] End[facility]  [facility] [address] [place] [place], [place] [address] Phone: [phone] FAX: [phone]      CC: Leontine Cramp, NP [address] [place] [address] Phone: [phone]  Fax: [phone]   Return to Endocrinology clinic as below: No future appointments.      "

## 2024-06-28 ENCOUNTER — Ambulatory Visit: Payer: Self-pay | Admitting: Internal Medicine

## 2024-06-28 LAB — T4, FREE: Free T4: 1.4 ng/dL (ref 0.8–1.8)

## 2024-06-28 LAB — TSH: TSH: 0.47 m[IU]/L (ref 0.40–4.50)

## 2024-12-30 ENCOUNTER — Ambulatory Visit: Payer: Medicare (Managed Care) | Admitting: Internal Medicine
